# Patient Record
Sex: Female | Born: 1980
Health system: Southern US, Community
[De-identification: ages and names within clinical notes are randomized; demographics above are authoritative.]

## PROBLEM LIST (undated history)

## (undated) DIAGNOSIS — F909 Attention-deficit hyperactivity disorder, unspecified type: Secondary | ICD-10-CM

## (undated) DIAGNOSIS — F319 Bipolar disorder, unspecified: Secondary | ICD-10-CM

## (undated) DIAGNOSIS — G43909 Migraine, unspecified, not intractable, without status migrainosus: Secondary | ICD-10-CM

## (undated) DIAGNOSIS — T50992A Poisoning by other drugs, medicaments and biological substances, intentional self-harm, initial encounter: Secondary | ICD-10-CM

## (undated) HISTORY — PX: NO PAST SURGERIES: SHX2092

---

## 2007-09-27 ENCOUNTER — Emergency Department: Payer: Self-pay

## 2008-06-21 ENCOUNTER — Emergency Department: Payer: Self-pay

## 2009-11-26 DIAGNOSIS — R9431 Abnormal electrocardiogram [ECG] [EKG]: Secondary | ICD-10-CM | POA: Insufficient documentation

## 2009-11-26 DIAGNOSIS — I10 Essential (primary) hypertension: Secondary | ICD-10-CM | POA: Insufficient documentation

## 2014-09-28 DIAGNOSIS — G43909 Migraine, unspecified, not intractable, without status migrainosus: Secondary | ICD-10-CM | POA: Insufficient documentation

## 2015-04-01 ENCOUNTER — Encounter: Payer: Self-pay | Admitting: Emergency Medicine

## 2015-04-01 ENCOUNTER — Emergency Department
Admission: EM | Admit: 2015-04-01 | Discharge: 2015-04-01 | Disposition: A | Discharge status: Home / Self Care | Payer: PRIVATE HEALTH INSURANCE | Attending: Emergency Medicine | Admitting: Emergency Medicine

## 2015-04-01 DIAGNOSIS — Y9389 Activity, other specified: Secondary | ICD-10-CM | POA: Diagnosis not present

## 2015-04-01 DIAGNOSIS — Y92410 Unspecified street and highway as the place of occurrence of the external cause: Secondary | ICD-10-CM | POA: Diagnosis not present

## 2015-04-01 DIAGNOSIS — Y998 Other external cause status: Secondary | ICD-10-CM | POA: Diagnosis not present

## 2015-04-01 DIAGNOSIS — Z041 Encounter for examination and observation following transport accident: Secondary | ICD-10-CM | POA: Diagnosis not present

## 2015-04-01 HISTORY — DX: Attention-deficit hyperactivity disorder, unspecified type: F90.9

## 2015-04-01 HISTORY — DX: Migraine, unspecified, not intractable, without status migrainosus: G43.909

## 2015-04-01 NOTE — Discharge Instructions (Signed)
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
## 2015-07-31 LAB — HEPATIC FUNCTION PANEL
ALK PHOS: 78 U/L (ref 25–125)
ALT: 15 U/L (ref 7–35)
AST: 17 U/L (ref 13–35)
Bilirubin, Total: 0.3 mg/dL

## 2015-07-31 LAB — LIPID PANEL
Cholesterol: 205 mg/dL — AB (ref 0–200)
HDL: 62 mg/dL (ref 35–70)
LDL CALC: 113 mg/dL
LDl/HDL Ratio: 3.3
Triglycerides: 117 mg/dL (ref 40–160)

## 2015-07-31 LAB — CBC AND DIFFERENTIAL
HCT: 40 % (ref 36–46)
Hemoglobin: 13.3 g/dL (ref 12.0–16.0)
NEUTROS ABS: 4 /uL
PLATELETS: 274 10*3/uL (ref 150–399)
WBC: 6.4 10*3/mL

## 2015-07-31 LAB — BASIC METABOLIC PANEL
BUN: 12 mg/dL (ref 4–21)
Creatinine: 1 mg/dL (ref 0.5–1.1)
Glucose: 78 mg/dL
Potassium: 4.1 mmol/L (ref 3.4–5.3)
Sodium: 138 mmol/L (ref 137–147)

## 2015-07-31 LAB — TSH: TSH: 1.3 u[IU]/mL (ref 0.41–5.90)

## 2015-08-15 ENCOUNTER — Encounter: Payer: Self-pay | Admitting: Family Medicine

## 2015-08-21 ENCOUNTER — Encounter: Payer: Self-pay | Admitting: Family Medicine

## 2015-08-21 ENCOUNTER — Ambulatory Visit (INDEPENDENT_AMBULATORY_CARE_PROVIDER_SITE_OTHER): Payer: PRIVATE HEALTH INSURANCE | Admitting: Family Medicine

## 2015-08-21 VITALS — BP 124/82 | HR 72 | Temp 98.5°F | Resp 16 | Ht 66.5 in | Wt 200.0 lb

## 2015-08-21 DIAGNOSIS — F908 Attention-deficit hyperactivity disorder, other type: Secondary | ICD-10-CM

## 2015-08-21 DIAGNOSIS — R635 Abnormal weight gain: Secondary | ICD-10-CM

## 2015-08-21 DIAGNOSIS — E538 Deficiency of other specified B group vitamins: Secondary | ICD-10-CM

## 2015-08-21 DIAGNOSIS — F329 Major depressive disorder, single episode, unspecified: Secondary | ICD-10-CM

## 2015-08-21 DIAGNOSIS — F314 Bipolar disorder, current episode depressed, severe, without psychotic features: Secondary | ICD-10-CM | POA: Insufficient documentation

## 2015-08-21 DIAGNOSIS — F32A Depression, unspecified: Secondary | ICD-10-CM

## 2015-08-21 DIAGNOSIS — F909 Attention-deficit hyperactivity disorder, unspecified type: Secondary | ICD-10-CM | POA: Insufficient documentation

## 2015-08-21 MED ORDER — METFORMIN HCL 500 MG PO TABS: 500.0000 mg | ORAL_TABLET | Freq: Every day | ORAL | Status: DC

## 2015-08-21 MED ORDER — "SYRINGE 25G X 1"" 3 ML MISC": Status: DC

## 2015-08-21 MED ORDER — CYANOCOBALAMIN 1000 MCG/ML IJ SOLN
1000.0000 ug | Freq: Once | INTRAMUSCULAR | Status: AC
  Administered 2015-08-21: 1000 ug via INTRAMUSCULAR

## 2015-08-21 MED ORDER — CYANOCOBALAMIN 1000 MCG/ML IJ SOLN: 1000.0000 ug | Freq: Once | INTRAMUSCULAR | Status: DC

## 2015-08-21 NOTE — Progress Notes (Signed)
Patient ID: Shelby Gallegos, female   DOB: 07-06-1981, 34 y.o.   MRN: [id number]    Subjective:  HPI  Patient is here to get re established and discuss recent labs that were done by Dr. Evelene Croon.  Patient states she was sent here due to her Vit B12 level-106 drawn on 07/31/15. At that time other labs were drawn including lipid, TSH, CBC, MetC, Vitamin D. Patient states that the reason labs were drawn is because she was gaining weight rapidly even Dr. Evelene Croon was shocked.  In May 2016 patient weighed 150 lbs and today she is at 200 lbs.  She does exercise twice a week at the gym-walking and weights mainly. 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  Prior to Admission medications   Medication Sig Start Date End Date Taking? Authorizing Provider  ALPRAZolam Prudy Feeler) 0.5 MG tablet Take 0.5 mg by mouth at bedtime as needed for anxiety.   Yes Historical Provider, MD  amphetamine-dextroamphetamine (ADDERALL) 20 MG tablet Take 20 mg by mouth 2 (two) times daily.   Yes Historical Provider, MD  isometheptene-acetaminophen-dichloralphenazone (MIDRIN) 65-100-325 MG capsule Take 1 capsule by mouth 4 (four) times daily as needed for migraine. Maximum 5 capsules in 12 hours for migraine headaches, 8 capsules in 24 hours for tension headaches.   Yes Historical Provider, MD  lamoTRIgine (LAMICTAL) 200 MG tablet LAMICTAL,  (Oral Tablet)  1 q d for 0 days  Quantity: 0.00;  Refills: 0   Ordered :04-Jul-2008  Janey Greaser ;  Started 04-Jul-2008 Active 07/04/08  Yes Historical Provider, MD  topiramate (TOPAMAX) 100 MG tablet Take 100 mg by mouth 2 (two) times daily.   Yes Historical Provider, MD    Patient Active Problem List   Diagnosis Date Noted  . Depression 08/21/2015  . ADHD (attention deficit hyperactivity disorder) 08/21/2015  . Headache, migraine 09/28/2014  . Abnormal ECG 11/26/2009  . Essential (primary) hypertension  11/26/2009  . Bipolar I disorder 07/04/2008    Past Medical History  Diagnosis Date  . ADHD (attention deficit hyperactivity disorder)   . Migraine     Social History   Social History  . Marital Status: Single    Spouse Name: N/A  . Number of Children: N/A  . Years of Education: N/A   Occupational History  . Not on file.   Social History Main Topics  . Smoking status: Never Smoker   . Smokeless tobacco: Never Used  . Alcohol Use: No  . Drug Use: No  . Sexual Activity: Not on file   Other Topics Concern  . Not on file   Social History Narrative    No Known Allergies  Review of Systems  Constitutional: Negative.        Weight gain rapid  Eyes: Negative.   Respiratory: Negative.   Cardiovascular: Negative.   Gastrointestinal: Negative.   Musculoskeletal: Negative.   Skin: Negative.   Neurological: Negative.   Endo/Heme/Allergies: Negative.   Psychiatric/Behavioral: Positive for depression. The patient is nervous/anxious.      Objective:  BP 124/82 mmHg  Pulse 72  Temp(Src) 98.5 F (36.9 C)  Resp 16  Ht 5' 6.5" (1.689 m)  Wt 200 lb (90.719 kg)  BMI 31.80 kg/m2  Physical Exam  Constitutional: She is oriented to person, place, and time and well-developed, well-nourished, and in no distress.  Mildly obese white female in no  acute distress  HENT:  Head: Normocephalic and atraumatic.  Right Ear: External ear normal.  Left Ear: External ear normal.  Nose: Nose normal.  Neck: Neck supple.  Cardiovascular: Normal rate, regular rhythm and normal heart sounds.   Pulmonary/Chest: Effort normal and breath sounds normal.  Abdominal: Soft.  Neurological: She is alert and oriented to person, place, and time.  Skin: Skin is warm and dry.  Psychiatric: Mood, memory, affect and judgment normal.      Assessment and Plan :  1. Depression Followed by psychiatry.  2. Attention-deficit hyperactivity disorder, other type   3. Weight gain, abnormal -  metFORMIN (GLUCOPHAGE) 500 MG tablet; Take 1 tablet (500 mg total) by mouth daily with breakfast.  Dispense: 30 tablet; Refill: 12 Increased appetite due to meds for depression--necessary. RTC 1 month on the metformin. 4. Vitamin B12 deficiency Go ahead and treat for 6-12 months with parenteral therapy then switch to po B12 and folic acid. - cyanocobalamin ((VITAMIN B-12)) injection 1,000 mcg; Inject 1 mL (1,000 mcg total) into the muscle once. - cyanocobalamin (,VITAMIN B-12,) 1000 MCG/ML injection; Inject 1 mL (1,000 mcg total) into the muscle once.  Dispense: 10 mL; Refill: 12 - Syringe/Needle, Disp, (SYRINGE 3CC/25GX1") 25G X 1" 3 ML MISC; Administer injection once a week for 3 weeks then go to once a month.  Dispense: 4 each; Refill: 12   Julieanne Manson MD [facility] 08/21/2015 9:24 AM

## 2015-09-10 ENCOUNTER — Ambulatory Visit: Payer: PRIVATE HEALTH INSURANCE | Admitting: Family Medicine

## 2015-09-17 ENCOUNTER — Ambulatory Visit: Payer: PRIVATE HEALTH INSURANCE | Admitting: Family Medicine

## 2015-10-09 ENCOUNTER — Encounter: Payer: Self-pay | Admitting: Physician Assistant

## 2015-10-09 ENCOUNTER — Ambulatory Visit: Payer: Self-pay | Admitting: Physician Assistant

## 2015-10-09 VITALS — BP 130/80 | HR 111 | Temp 99.0°F

## 2015-10-09 DIAGNOSIS — J069 Acute upper respiratory infection, unspecified: Secondary | ICD-10-CM

## 2015-10-09 MED ORDER — AZITHROMYCIN 250 MG PO TABS: ORAL_TABLET | ORAL | Status: DC

## 2015-10-09 NOTE — Progress Notes (Signed)
S: C/o runny nose and congestion for 3 days, some  fever, no chills, cp/sob, v/d; mucus is green and thick, cough is sporadic, c/o of facial and dental pain.   Using otc meds:   O: PE: perrl eomi, normocephalic, tms dull, nasal mucosa red and swollen, + green mucus in nasal cavity;  throat injected, neck supple no lymph, lungs c t a, cv rrr, neuro intact  A:  Acute sinusitis   P: zpack, drink fluids, continue regular meds , use otc meds of choice, return if not improving in 5 days, return earlier if worsening

## 2015-12-15 ENCOUNTER — Emergency Department
Admission: EM | Admit: 2015-12-15 | Discharge: 2015-12-15 | Disposition: A | Discharge status: Home / Self Care | Payer: Managed Care, Other (non HMO) | Attending: Emergency Medicine | Admitting: Emergency Medicine

## 2015-12-15 ENCOUNTER — Emergency Department: Payer: Managed Care, Other (non HMO)

## 2015-12-15 ENCOUNTER — Encounter: Payer: Self-pay | Admitting: *Deleted

## 2015-12-15 DIAGNOSIS — I10 Essential (primary) hypertension: Secondary | ICD-10-CM | POA: Insufficient documentation

## 2015-12-15 DIAGNOSIS — K529 Noninfective gastroenteritis and colitis, unspecified: Secondary | ICD-10-CM

## 2015-12-15 DIAGNOSIS — R1031 Right lower quadrant pain: Secondary | ICD-10-CM | POA: Diagnosis present

## 2015-12-15 DIAGNOSIS — Z79899 Other long term (current) drug therapy: Secondary | ICD-10-CM | POA: Insufficient documentation

## 2015-12-15 DIAGNOSIS — K6389 Other specified diseases of intestine: Secondary | ICD-10-CM

## 2015-12-15 DIAGNOSIS — Z7984 Long term (current) use of oral hypoglycemic drugs: Secondary | ICD-10-CM | POA: Insufficient documentation

## 2015-12-15 DIAGNOSIS — Z3202 Encounter for pregnancy test, result negative: Secondary | ICD-10-CM | POA: Diagnosis not present

## 2015-12-15 DIAGNOSIS — K599 Functional intestinal disorder, unspecified: Secondary | ICD-10-CM | POA: Insufficient documentation

## 2015-12-15 HISTORY — DX: Bipolar disorder, unspecified: F31.9

## 2015-12-15 LAB — CBC WITH DIFFERENTIAL/PLATELET
BASOS ABS: 0.1 10*3/uL (ref 0–0.1)
Basophils Relative: 1 %
Eosinophils Absolute: 0.1 10*3/uL (ref 0–0.7)
Eosinophils Relative: 1 %
HCT: 39.8 % (ref 35.0–47.0)
Hemoglobin: 13.1 g/dL (ref 12.0–16.0)
LYMPHS ABS: 2.2 10*3/uL (ref 1.0–3.6)
LYMPHS PCT: 22 %
MCH: 28.9 pg (ref 26.0–34.0)
MCHC: 32.8 g/dL (ref 32.0–36.0)
MCV: 88 fL (ref 80.0–100.0)
MONO ABS: 0.8 10*3/uL (ref 0.2–0.9)
Monocytes Relative: 8 %
Neutro Abs: 7.2 10*3/uL — ABNORMAL HIGH (ref 1.4–6.5)
Neutrophils Relative %: 68 %
Platelets: 269 10*3/uL (ref 150–440)
RBC: 4.52 MIL/uL (ref 3.80–5.20)
RDW: 13.4 % (ref 11.5–14.5)
WBC: 10.4 10*3/uL (ref 3.6–11.0)

## 2015-12-15 LAB — URINALYSIS COMPLETE WITH MICROSCOPIC (ARMC ONLY)
BACTERIA UA: NONE SEEN
Bilirubin Urine: NEGATIVE
GLUCOSE, UA: NEGATIVE mg/dL
Ketones, ur: NEGATIVE mg/dL
NITRITE: NEGATIVE
PROTEIN: NEGATIVE mg/dL
Specific Gravity, Urine: 1.013 (ref 1.005–1.030)
pH: 7 (ref 5.0–8.0)

## 2015-12-15 LAB — COMPREHENSIVE METABOLIC PANEL
ALT: 19 U/L (ref 14–54)
AST: 20 U/L (ref 15–41)
Albumin: 3.9 g/dL (ref 3.5–5.0)
Alkaline Phosphatase: 81 U/L (ref 38–126)
Anion gap: 9 (ref 5–15)
BUN: 10 mg/dL (ref 6–20)
CALCIUM: 8.9 mg/dL (ref 8.9–10.3)
CO2: 23 mmol/L (ref 22–32)
CREATININE: 1.18 mg/dL — AB (ref 0.44–1.00)
Chloride: 106 mmol/L (ref 101–111)
GFR calc Af Amer: >60 mL/min (ref >60)
GFR calc non Af Amer: 59 mL/min — ABNORMAL LOW (ref >60)
Glucose, Bld: 98 mg/dL (ref 65–99)
Potassium: 3.5 mmol/L (ref 3.5–5.1)
Sodium: 138 mmol/L (ref 135–145)
Total Bilirubin: 0.4 mg/dL (ref 0.3–1.2)
Total Protein: 7.4 g/dL (ref 6.5–8.1)

## 2015-12-15 LAB — POCT PREGNANCY, URINE: Preg Test, Ur: NEGATIVE

## 2015-12-15 LAB — LIPASE, BLOOD: LIPASE: 21 U/L (ref 11–51)

## 2015-12-15 MED ORDER — IOHEXOL 240 MG/ML SOLN
25.0000 mL | Freq: Once | INTRAMUSCULAR | Status: AC | PRN
  Administered 2015-12-15: 25 mL via ORAL

## 2015-12-15 MED ORDER — IOHEXOL 300 MG/ML  SOLN
100.0000 mL | Freq: Once | INTRAMUSCULAR | Status: AC | PRN
  Administered 2015-12-15: 100 mL via INTRAVENOUS

## 2015-12-15 MED ORDER — KETOROLAC TROMETHAMINE 30 MG/ML IJ SOLN
30.0000 mg | Freq: Once | INTRAMUSCULAR | Status: AC
  Administered 2015-12-15: 30 mg via INTRAVENOUS
  Filled 2015-12-15: qty 1

## 2015-12-15 MED ORDER — TRAMADOL HCL 50 MG PO TABS: 50.0000 mg | ORAL_TABLET | Freq: Four times a day (QID) | ORAL | Status: DC | PRN

## 2015-12-15 NOTE — ED Notes (Signed)
Back from ct  Resting at present

## 2015-12-15 NOTE — ED Notes (Signed)
Developed RLQ pain about 2 days ago  No fever or n/v or urinary sx;s  States pain gets worse with palpation and inspiration

## 2015-12-15 NOTE — ED Provider Notes (Signed)
Time Seen: Approximately ----------------------------------------- 9:11 AM on 12/15/2015 -----------------------------------------    I have reviewed the triage notes  Chief Complaint: Abdominal Pain   History of Present Illness: Shelby Gallegos is a 35 y.o. female who states a 2 day history of intermittent right lower quadrant abdominal pain. She's had some mild nausea with no persistent vomiting. She denies any loose stool, diarrhea, constipation. She denies any dysuria, hematuria, or urinary frequency. She denies any fever at home. Patient states the pain seems to get worse with either pressing on her abdomen or with deep inspiration. She denies any radiation of pain to the flank or lower back region.   Past Medical History  Diagnosis Date  . ADHD (attention deficit hyperactivity disorder)   . Migraine   . Bipolar 1 disorder [facility](HCC)     Patient Active Problem List   Diagnosis Date Noted  . Depression 08/21/2015  . ADHD (attention deficit hyperactivity disorder) 08/21/2015  . Headache, migraine 09/28/2014  . Abnormal ECG 11/26/2009  . Essential (primary) hypertension 11/26/2009  . Bipolar I disorder (HCC) 07/04/2008    Past Surgical History  Procedure Laterality Date  . No past surgeries      Past Surgical History  Procedure Laterality Date  . No past surgeries      Current Outpatient Rx  Name  Route  Sig  Dispense  Refill  . ALPRAZolam (XANAX) 0.5 MG tablet   Oral   Take 0.5 mg by mouth at bedtime as needed for anxiety.         Marland Kitchen. amphetamine-dextroamphetamine (ADDERALL) 20 MG tablet   Oral   Take 20 mg by mouth 2 (two) times daily.         Marland Kitchen. azithromycin (ZITHROMAX Z-PAK) 250 MG tablet      2 pills today then 1 pill a day for 4 days   6 each   0   . cyanocobalamin (,VITAMIN B-12,) 1000 MCG/ML injection   Intramuscular   Inject 1 mL (1,000 mcg total) into the muscle once.   10 mL   12   . isometheptene-acetaminophen-dichloralphenazone (MIDRIN)  65-100-325 MG capsule   Oral   Take 1 capsule by mouth 4 (four) times daily as needed for migraine. Maximum 5 capsules in 12 hours for migraine headaches, 8 capsules in 24 hours for tension headaches.         . lamoTRIgine (LAMICTAL) 200 MG tablet      LAMICTAL, 200MG  (Oral Tablet)  1 q d for 0 days  Quantity: 0.00;  Refills: 0   Ordered :04-Jul-2008  Janey GreaserDeSanto, Elena ;  Started 04-Jul-2008 Active         . lurasidone (LATUDA) 40 MG TABS tablet   Oral   Take 20 mg by mouth daily with breakfast.         . metFORMIN (GLUCOPHAGE) 500 MG tablet   Oral   Take 1 tablet (500 mg total) by mouth daily with breakfast.   30 tablet   12   . Syringe/Needle, Disp, (SYRINGE 3CC/25GX1") 25G X 1" 3 ML MISC      Administer injection once a week for 3 weeks then go to once a month.   4 each   12   . topiramate (TOPAMAX) 100 MG tablet   Oral   Take 100 mg by mouth 2 (two) times daily.           Allergies:  Review of patient's allergies indicates no known allergies.  Family History: Family History  Problem  Relation Age of Onset  . Atrial fibrillation Mother     Social History: Social History  Substance Use Topics  . Smoking status: Never Smoker   . Smokeless tobacco: Never Used  . Alcohol Use: No     Review of Systems:   10 point review of systems was performed and was otherwise negative:  Constitutional: No fever Eyes: No visual disturbances ENT: No sore throat, ear pain Cardiac: No chest pain Respiratory: No shortness of breath, wheezing, or stridor Abdomen: No abdominal pain, no vomiting, No diarrhea Endocrine: No weight loss, No night sweats Extremities: No peripheral edema, cyanosis Skin: No rashes, easy bruising Neurologic: No focal weakness, trouble with speech or swollowing Urologic: No dysuria, Hematuria, or urinary frequency Patient denies any vaginal bleeding or discharge and states her last menstrual period was approximately 2 weeks ago and denies any  risk of being pregnant  Physical Exam:  ED Triage Vitals  Enc Vitals Group     BP 12/15/15 0851 139/85 mmHg     Pulse Rate 12/15/15 0851 110     Resp 12/15/15 0851 20     Temp 12/15/15 0851 98.2 F (36.8 C)     Temp Source 12/15/15 0851 Oral     SpO2 12/15/15 0851 98 %     Weight 12/15/15 0851 180 lb (81.647 kg)     Height 12/15/15 0851 5\' 7"  (1.702 m)     Head Cir --      Peak Flow --      Pain Score 12/15/15 0852 5     Pain Loc --      Pain Edu? --      Excl. in GC? --     General: Awake , Alert , and Oriented times 3; GCS 15 Head: Normal cephalic , atraumatic Eyes: Pupils equal , round, reactive to light Nose/Throat: No nasal drainage, patent upper airway without erythema or exudate.  Neck: Supple, Full range of motion, No anterior adenopathy or palpable thyroid masses Lungs: Clear to ascultation without wheezes , rhonchi, or rales Heart: Regular rate, regular rhythm without murmurs , gallops , or rubs Abdomen: Mild tenderness in her right middle quadrant without rebound, guarding , or rigidity; bowel sounds positive and symmetric in all 4 quadrants. No organomegaly .       No focal tenderness over McBurney's point, negative Murphy's sign Extremities: 2 plus symmetric pulses. No edema, clubbing or cyanosis Neurologic: normal ambulation, Motor symmetric without deficits, sensory intact Skin: warm, dry, no rashes   Labs:   All laboratory work was reviewed including any pertinent negatives or positives listed below:  Labs Reviewed  CBC WITH DIFFERENTIAL/PLATELET  COMPREHENSIVE METABOLIC PANEL  LIPASE, BLOOD  POC URINE PREG, ED   Review of patient's laboratory work shows no significant abnormalities  Radiology:   EXAM: CT ABDOMEN AND PELVIS WITH CONTRAST  TECHNIQUE: Multidetector CT imaging of the abdomen and pelvis was performed using the standard protocol following bolus administration of intravenous contrast.  CONTRAST: OMNIPAQUE IOHEXOL 300 MG/ML  SOLN  COMPARISON: None.  FINDINGS: Lower chest: No significant pulmonary nodules or acute consolidative airspace disease.  Hepatobiliary: Normal liver with no liver mass. Normal gallbladder with no radiopaque cholelithiasis. No biliary ductal dilatation.  Pancreas: Normal, with no mass or duct dilation.  Spleen: Normal size. No mass.  Adrenals/Urinary Tract: Normal adrenals. Normal kidneys with no hydronephrosis and no renal mass. Normal bladder.  Stomach/Bowel: Grossly normal stomach. Normal caliber small bowel with no small bowel wall thickening. Normal  appendix. There is a subcentimeter mildly thick walled structure with central fat density in the anterior pericolonic fat adjacent to the ascending colon (best seen on sagittal series 6/image 31) with associated surrounding fat stranding, in keeping with acute epiploic appendagitis. There is no large bowel wall thickening or colonic diverticulosis.  Vascular/Lymphatic: Normal caliber abdominal aorta. Patent portal, splenic, hepatic and renal veins. No pathologically enlarged lymph nodes in the abdomen or pelvis.  Reproductive: Grossly normal uterus. No adnexal mass.  Other: No pneumoperitoneum, ascites or focal fluid collection.  Musculoskeletal: No aggressive appearing focal osseous lesions.  IMPRESSION: 1. Acute epiploic appendagitis adjacent to the anterior ascending colon. This is a self-resolving condition for which supportive care is indicated. 2. Otherwise normal CT abdomen/ pelvis. Normal appendix. No colonic diverticulosis.     I personally reviewed the radiologic studies     ED Course:  Patient's stay here was uneventful. Her differential includes all causes for lower abdominal pain in females Differential diagnosis includes but is not exclusive to ovarian cyst, ovarian torsion, acute appendicitis, urinary tract infection, endometriosis, bowel obstruction, colitis, renal colic, gastroenteritis,  etc. Given the patient's current clinical presentation and objective findings appears she has epiploic appendagitis. Patient stated was uneventful and she had symptomatic improvement with Toradol IV. She does not appear to have any signs of a surgical abdomen at this point.   Assessment:  Epiploic appendagitis  F   Plan:  Patient was advised to return immediately if condition worsens. Patient was advised to follow up with their primary care physician or other specialized physicians involved in their outpatient care             Jennye Moccasin, MD 12/15/15 1556

## 2015-12-15 NOTE — ED Notes (Signed)
Pt complains of right lower abdominal pain, pt denies fever and any other symptoms

## 2016-01-28 ENCOUNTER — Ambulatory Visit: Payer: Self-pay | Admitting: Registered Nurse

## 2016-01-28 VITALS — BP 119/75 | HR 133 | Temp 98.8°F

## 2016-01-28 DIAGNOSIS — J0101 Acute recurrent maxillary sinusitis: Secondary | ICD-10-CM

## 2016-01-28 DIAGNOSIS — H6593 Unspecified nonsuppurative otitis media, bilateral: Secondary | ICD-10-CM

## 2016-01-28 DIAGNOSIS — B349 Viral infection, unspecified: Secondary | ICD-10-CM

## 2016-01-28 DIAGNOSIS — R509 Fever, unspecified: Secondary | ICD-10-CM

## 2016-01-28 LAB — POCT INFLUENZA A/B
INFLUENZA B, POC: NEGATIVE
Influenza A, POC: NEGATIVE

## 2016-01-28 MED ORDER — LORATADINE 10 MG PO TABS: 10.0000 mg | ORAL_TABLET | Freq: Every day | ORAL | Status: DC

## 2016-01-28 MED ORDER — AZITHROMYCIN 250 MG PO TABS: 250.0000 mg | ORAL_TABLET | Freq: Every day | ORAL | Status: DC

## 2016-01-28 MED ORDER — SALINE SPRAY 0.65 % NA SOLN: 2.0000 | NASAL | Status: DC | PRN

## 2016-01-28 MED ORDER — FLUTICASONE PROPIONATE 50 MCG/ACT NA SUSP: 2.0000 | Freq: Every day | NASAL | Status: DC

## 2016-01-28 NOTE — Progress Notes (Signed)
Subjective:    Patient ID: Shelby Gallegos, female    DOB: [birth year], 35 y.o.   MRN: [id number]  HPI Comments: Single caucasian female here for evaluation of fever, rhinitis, nasal congestion and sinus pressure "kinda feels like a sinus infection" PMHx seasonal allergies currently not taking anything Tmax 102 fever; took advil at home this am  URI  This is a new problem. The current episode started in the past 7 days. The problem has been unchanged. The maximum temperature recorded prior to her arrival was 102 - 102.9 F. The fever has been present for 1 to 2 days. Associated symptoms include congestion, coughing, rhinorrhea, sinus pain, a sore throat and wheezing. Pertinent negatives include no abdominal pain, chest pain, diarrhea, dysuria, ear pain, headaches, joint pain, joint swelling, nausea, neck pain, plugged ear sensation, rash, sneezing, swollen glands or vomiting. She has tried NSAIDs, increased fluids, eating and sleep for the symptoms. The treatment provided mild relief.  Sinusitis Associated symptoms include congestion, coughing, sinus pressure and a sore throat. Pertinent negatives include no chills, diaphoresis, ear pain, headaches, neck pain, shortness of breath, sneezing or swollen glands.      Review of Systems  Constitutional: Positive for fever and fatigue. Negative for chills, diaphoresis, activity change and appetite change.  HENT: Positive for congestion, postnasal drip, rhinorrhea, sinus pressure, sore throat and voice change. Negative for ear discharge, ear pain, facial swelling, hearing loss, mouth sores, nosebleeds, sneezing, tinnitus and trouble swallowing.   Eyes: Negative for photophobia, pain, discharge, redness, itching and visual disturbance.  Respiratory: Positive for cough and wheezing. Negative for choking, chest tightness, shortness of breath and stridor.   Cardiovascular: Negative for chest pain, palpitations and leg swelling.  Gastrointestinal: Negative for  nausea, vomiting, abdominal pain, diarrhea, constipation, blood in stool and abdominal distention.  Endocrine: Negative for cold intolerance and heat intolerance.  Genitourinary: Negative for dysuria.  Musculoskeletal: Negative for myalgias, back pain, joint pain, joint swelling, arthralgias, gait problem and neck pain.  Skin: Negative for rash.  Allergic/Immunologic: Positive for environmental allergies. Negative for food allergies.  Neurological: Negative for dizziness, tremors, syncope, facial asymmetry, weakness and headaches.  Hematological: Negative for adenopathy. Does not bruise/bleed easily.  Psychiatric/Behavioral: Negative for sleep disturbance and agitation.       Objective:   Physical Exam  Constitutional: She is oriented to person, place, and time. She appears well-developed and well-nourished. She is active and cooperative.  Non-toxic appearance. She does not have a sickly appearance. She appears ill. No distress.  HENT:  Head: Normocephalic and atraumatic.  Right Ear: Hearing, external ear and ear canal normal. A middle ear effusion is present.  Left Ear: Hearing, external ear and ear canal normal. A middle ear effusion is present.  Nose: Mucosal edema and rhinorrhea present. No nose lacerations, sinus tenderness, nasal deformity, septal deviation or nasal septal hematoma. No epistaxis.  No foreign bodies. Right sinus exhibits maxillary sinus tenderness. Right sinus exhibits no frontal sinus tenderness. Left sinus exhibits maxillary sinus tenderness. Left sinus exhibits no frontal sinus tenderness.  Mouth/Throat: Uvula is midline and mucous membranes are normal. Mucous membranes are not pale, not dry and not cyanotic. She does not have dentures. No oral lesions. No trismus in the jaw. Normal dentition. No dental abscesses, uvula swelling, lacerations or dental caries. Posterior oropharyngeal edema and posterior oropharyngeal erythema present. No oropharyngeal exudate or tonsillar  abscesses.  Bilateral nasal turbinates copious clear discharge, congestion, erythema/edema; cobblestoning posterior pharynx; Bilateral TMs with air fluid level clear  Eyes: Conjunctivae, EOM and lids are normal. Pupils are equal, round, and reactive to light. Right eye exhibits no chemosis, no discharge, no exudate and no hordeolum. No foreign body present in the right eye. Left eye exhibits no chemosis, no discharge, no exudate and no hordeolum. No foreign body present in the left eye. Right conjunctiva is not injected. Right conjunctiva has no hemorrhage. Left conjunctiva is not injected. Left conjunctiva has no hemorrhage. No scleral icterus. Right eye exhibits normal extraocular motion and no nystagmus. Left eye exhibits normal extraocular motion and no nystagmus. Right pupil is round and reactive. Left pupil is round and reactive. Pupils are equal.  Neck: Trachea normal and normal range of motion. Neck supple. No tracheal tenderness, no spinous process tenderness and no muscular tenderness present. No rigidity. No tracheal deviation, no edema, no erythema and normal range of motion present. No thyroid mass and no thyromegaly present.  Cardiovascular: Normal rate, regular rhythm, S1 normal, S2 normal, normal heart sounds and intact distal pulses.  PMI is not displaced.  Exam reveals no gallop and no friction rub.   No murmur heard. Pulmonary/Chest: Effort normal and breath sounds normal. No accessory muscle usage or stridor. No respiratory distress. She has no decreased breath sounds. She has no wheezes. She has no rhonchi. She has no rales. She exhibits no tenderness.  Abdominal: Soft. She exhibits no distension.  Musculoskeletal: Normal range of motion. She exhibits no edema or tenderness.       Right shoulder: Normal.       Left shoulder: Normal.       Right hip: Normal.       Left hip: Normal.       Right knee: Normal.       Left knee: Normal.       Cervical back: Normal.       Right hand:  Normal.       Left hand: Normal.  Lymphadenopathy:       Head (right side): No submental, no submandibular, no tonsillar, no preauricular, no posterior auricular and no occipital adenopathy present.       Head (left side): No submental, no submandibular, no tonsillar, no preauricular, no posterior auricular and no occipital adenopathy present.    She has no cervical adenopathy.       Right cervical: No superficial cervical, no deep cervical and no posterior cervical adenopathy present.      Left cervical: No superficial cervical, no deep cervical and no posterior cervical adenopathy present.  Neurological: She is alert and oriented to person, place, and time. She has normal strength. She is not disoriented. She displays no atrophy and no tremor. No cranial nerve deficit or sensory deficit. She exhibits normal muscle tone. She displays no seizure activity. Coordination and gait normal. GCS eye subscore is 4. GCS verbal subscore is 5. GCS motor subscore is 6.  Skin: Skin is warm, dry and intact. No abrasion, no bruising, no burn, no ecchymosis, no laceration, no lesion, no petechiae and no rash noted. She is not diaphoretic. No cyanosis or erythema. No pallor. Nails show no clubbing.  Psychiatric: She has a normal mood and affect. Her speech is normal and behavior is normal. Judgment and thought content normal. Cognition and memory are normal.  Nursing note and vitals reviewed.     1000 patient notified rapid flu negative.  Patient verbalized understanding of information and had no further questions at this time.    Assessment & Plan:  A-viral illness, otitis  media with effusion, acute maxillary sinusitis P- Supportive treatment.   No evidence of invasive bacterial infection, non toxic and well hydrated.  This is most likely self limiting viral infection.  I do not see where any further testing or imaging is necessary at this time.   I will suggest supportive care, rest, good hygiene and encourage  the patient to take adequate fluids.  The patient is to return to clinic or EMERGENCY ROOM if symptoms worsen or change significantly e.g. ear pain, fever, purulent discharge from ears or bleeding.  Exitcare handout on otitis media with effusion given to patient.  Patient verbalized agreement and understanding of treatment plan.    Patient notified rapid strep negative.  Suspect Viral illness: no evidence of invasive bacterial infection, non toxic and well hydrated.  This is most likely self limiting viral infection.  I do not see where any further testing or imaging is necessary at this time.   I will suggest supportive care, rest, good hygiene and encourage the patient to take adequate fluids. work excuse x 48 hours discussed stay at home while running a fever greater than 100.5 degrees.  flonase 1 spray each nostril BID prn, nasal saline 1-2 sprays each nostril prn q2h, motrin 800mg  po TID prn.  Discussed honey with lemon and salt water gargles for comfort also.  The patient is to return to clinic or EMERGENCY ROOM if symptoms worsen or change significantly e.g. fever, lethargy, SOB, wheezing.   Patient verbalized agreement and understanding of treatment plan.    flonase 1 spray each nostril BID, saline 2 sprays each nostril q2h prn congestion. claritin 10mg  po daily.  Discussed sudafed is a stimulant as well as her adderall could cause palpitations.  Tylenol 1000mg  po QID prn or motrin 800mg  po TID prn fever/pain.  If no improvement with 48 hours of saline and flonase use start azithromycin Rx 500mg  po day 1 then 250mg  po daily days 2-5.  Rx given.  No evidence of systemic bacterial infection, non toxic and well hydrated.  I do not see where any further testing or imaging is necessary at this time.   I will suggest supportive care, rest, good hygiene and encourage the patient to take adequate fluids.  The patient is to return to clinic or EMERGENCY ROOM if symptoms worsen or change significantly.  Patient  verbalized agreement and understanding of treatment plan and had no further questions at this time.   P2:  Hand washing and cover cough  Rx azithromycin 500mg  po today and then 250mg  po days 2-5 given.  Honey with lemon, humidifier use, push po fluids.  Bronchitis simple, community acquired, may have started as viral (probably respiratory syncytial, parainfluenza, influenza, or adenovirus), but now evidence of acute purulent bronchitis with resultant bronchial edema and mucus formation.  Viruses are the most common cause of bronchial inflammation in otherwise healthy adults with acute bronchitis.  The appearance of sputum is not predictive of whether a bacterial infection is present.  Purulent sputum is most often caused by viral infections.  There are a small portion of those caused by non-viral agents being Mycoplamsa pneumonia.  Microscopic examination or C&S of sputum in the healthy adult with acute bronchitis is generally not helpful (usually negative or normal respiratory flora) other considerations being cough from upper respiratory tract infections, sinusitis or allergic syndromes (mild asthma or viral pneumonia).  Differential Diagnosis:  reactive airway disease (asthma, allergic aspergillosis (eosinophilia), chronic bronchitis, respiratory infection (Sinusitis, Common cold, pneumonia), congestive heart  failure, reflux esophagitis, bronchogenic tumor, aspiration syndromes and/or exposure irritants/tobacco smoke.  In this case, there is no evidence of any invasive bacterial illness.  Most likely viral etiology so will hold on antibiotic treatment.  Advise supportive care with rest, encourage fluids, good hygiene and watch for any worsening symptoms.  If they were to develop:  come back to the office or go to the emergency room if after hours. Without high fever, severe dyspnea, lack of physical findings or other risk factors, I will hold on a chest radiograph and CBC at this time.  I discussed that  approximately 50% of patients with acute bronchitis have a cough that lasts up to three weeks, and 25% for over a month.  Tylenol, one to two tablets every four hours as needed for fever or myalgias.   No aspirin.  Patient instructed to follow up in one week or sooner if symptoms worsen. Patient verbalized agreement and understanding of treatment plan.  P2:  hand washing and cover cough   Usually no specific medical treatment is needed if a virus is causing the sore throat.  The throat most often gets better on its own within 5 to 7 days.  Antibiotic medicine does not cure viral pharyngitis.   For acute pharyngitis caused by bacteria, your healthcare provider will prescribe an antibiotic.  Marland Kitchen Do not smoke.  Marland Kitchen Avoid secondhand smoke and other air pollutants.  . Use a cool mist humidifier to add moisture to the air.  . Get plenty of rest.  . You may want to rest your throat by talking less and eating a diet that is mostly liquid or soft for a day or two.   Marland Kitchen Nonprescription throat lozenges and mouthwashes should help relieve the soreness.   . Gargling with warm saltwater and drinking warm liquids may help.  (You can make a saltwater solution by adding 1/4 teaspoon of salt to 8 ounces, or 240 mL, of warm water.)  . A nonprescription pain reliever such as aspirin, acetaminophen, or ibuprofen may ease general aches and pains.   FOLLOW UP with clinic provider if no improvements in the next 7-10 days.  Patient verbalized understanding of instructions and agreed with plan of care. P2:  Hand washing and diet.

## 2016-09-10 ENCOUNTER — Emergency Department: Payer: Worker's Compensation

## 2016-09-10 ENCOUNTER — Encounter: Payer: Self-pay | Admitting: Emergency Medicine

## 2016-09-10 ENCOUNTER — Emergency Department
Admission: EM | Admit: 2016-09-10 | Discharge: 2016-09-10 | Disposition: A | Discharge status: Home / Self Care | Payer: Worker's Compensation | Attending: Student | Admitting: Student

## 2016-09-10 DIAGNOSIS — Y929 Unspecified place or not applicable: Secondary | ICD-10-CM | POA: Insufficient documentation

## 2016-09-10 DIAGNOSIS — W010XXA Fall on same level from slipping, tripping and stumbling without subsequent striking against object, initial encounter: Secondary | ICD-10-CM | POA: Diagnosis not present

## 2016-09-10 DIAGNOSIS — Z79899 Other long term (current) drug therapy: Secondary | ICD-10-CM | POA: Insufficient documentation

## 2016-09-10 DIAGNOSIS — Y990 Civilian activity done for income or pay: Secondary | ICD-10-CM | POA: Diagnosis not present

## 2016-09-10 DIAGNOSIS — F909 Attention-deficit hyperactivity disorder, unspecified type: Secondary | ICD-10-CM | POA: Insufficient documentation

## 2016-09-10 DIAGNOSIS — Y9389 Activity, other specified: Secondary | ICD-10-CM | POA: Insufficient documentation

## 2016-09-10 DIAGNOSIS — M79641 Pain in right hand: Secondary | ICD-10-CM

## 2016-09-10 DIAGNOSIS — S60221A Contusion of right hand, initial encounter: Secondary | ICD-10-CM | POA: Diagnosis not present

## 2016-09-10 DIAGNOSIS — S6991XA Unspecified injury of right wrist, hand and finger(s), initial encounter: Secondary | ICD-10-CM | POA: Diagnosis present

## 2016-09-10 NOTE — ED Provider Notes (Signed)
[facility] Emergency Department Provider Note   ____________________________________________   First MD Initiated Contact with Patient 09/10/16 [id number]     (approximate)  I have reviewed the triage vital signs and the nursing notes.   HISTORY  Chief Complaint Hand Injury    HPI Shelby Gallegos is a 35 y.o. female with bipolar disorder, ADHD and migraines who presents for evaluation of right hand injury/pain secondary to a fall which occured only just prior to arrival, sudden onset, pain has been constant, moderate, worse with movement. Patient works as a Radiation protection practitioner, she slipped while she was exiting the ambulance and landed on the right hand. She reports the pinky finger was briefly trapped under a grate when and she was able to free it, it was dislocated however she put it back in place. She is complaining of mild right shoulder pain but has no other pain complaints. No chest pain or difficulty breathing. No head injury or loss of consciousness.   Past Medical History:  Diagnosis Date  . ADHD (attention deficit hyperactivity disorder)   . Bipolar 1 disorder (HCC)   . Migraine     Patient Active Problem List   Diagnosis Date Noted  . Depression 08/21/2015  . ADHD (attention deficit hyperactivity disorder) 08/21/2015  . Headache, migraine 09/28/2014  . Abnormal ECG 11/26/2009  . Essential (primary) hypertension 11/26/2009  . Bipolar I disorder (HCC) 07/04/2008    Past Surgical History:  Procedure Laterality Date  . NO PAST SURGERIES      Prior to Admission medications   Medication Sig Start Date End Date Taking? Authorizing Provider  amphetamine-dextroamphetamine (ADDERALL) 20 MG tablet Take 20 mg by mouth 3 (three) times daily.     Historical Provider, MD  azithromycin (ZITHROMAX) 250 MG tablet Take 1 tablet (250 mg total) by mouth daily. Take first 2 tablets together, then 1 every day until finished. 01/28/16   Barbaraann Barthel, NP  fluticasone  (FLONASE) 50 MCG/ACT nasal spray Place 2 sprays into both nostrils daily. 01/28/16   Barbaraann Barthel, NP  lamoTRIgine (LAMICTAL) 200 MG tablet 1 tablet oral daily 07/04/08   Historical Provider, MD  loratadine (CLARITIN) 10 MG tablet Take 1 tablet (10 mg total) by mouth daily. 01/28/16   Barbaraann Barthel, NP  Lurasidone HCl (LATUDA) 60 MG TABS Take 1 tablet by mouth daily.    Historical Provider, MD  sodium chloride (OCEAN) 0.65 % SOLN nasal spray Place 2 sprays into both nostrils every 2 (two) hours as needed for congestion. 01/28/16   Barbaraann Barthel, NP  SUMAtriptan (IMITREX) 6 MG/0.5ML SOLN injection Inject 6 mg into the skin every 2 (two) hours as needed for migraine or headache. May repeat in 2 hours if headache persists or recurs.    Historical Provider, MD    Allergies Review of patient's allergies indicates no known allergies.  Family History  Problem Relation Age of Onset  . Atrial fibrillation Mother     Social History Social History  Substance Use Topics  . Smoking status: Never Smoker  . Smokeless tobacco: Never Used  . Alcohol use No    Review of Systems Constitutional: No fever/chills Eyes: No visual changes. ENT: No sore throat. Cardiovascular: Denies chest pain. Respiratory: Denies shortness of breath. Gastrointestinal: No abdominal pain.  No nausea, no vomiting.  No diarrhea.  No constipation. Genitourinary: Negative for dysuria. Musculoskeletal: Negative for back pain. Skin: Negative for rash. Neurological: Negative for headaches, focal weakness or numbness.  10-point ROS  otherwise negative.  ____________________________________________   PHYSICAL EXAM:  VITAL SIGNS: ED Triage Vitals  Enc Vitals Group     BP 09/10/16 0443 112/73     Pulse Rate 09/10/16 0443 95     Resp 09/10/16 0443 18     Temp --      Temp src --      SpO2 09/10/16 0443 97 %     Weight 09/10/16 0444 180 lb (81.6 kg)     Height 09/10/16 0444 5\' 7"  (1.702 m)     Head Circumference  --      Peak Flow --      Pain Score --      Pain Loc --      Pain Edu? --      Excl. in GC? --     Constitutional: Alert and oriented. Well appearing and in no acute distress. Eyes: Conjunctivae are normal. PERRL. EOMI. Head: Atraumatic. Nose: No congestion/rhinnorhea. Mouth/Throat: Mucous membranes are moist.  Oropharynx non-erythematous. Neck: No stridor. Supple without meningismus. Cardiovascular: Normal rate, regular rhythm. Grossly normal heart sounds.  Good peripheral circulation. Respiratory: Normal respiratory effort.  No retractions. Lungs CTAB. Gastrointestinal: Soft and nontender. No distention.  No CVA tenderness. Genitourinary: Deferred Musculoskeletal: Abrasions, swelling, tenderness of the knuckles of the right hand. Full flexion and extension of all joints in all fingers. Right radial, median and ulnar nerve intact, 2+ right radial pulse. Tenderness to palpation in the right wrist, no tenderness in the anatomic snuff box. Full active painless range of motion at the right shoulder without swelling or deformity. Neurologic:  Normal speech and language. No gross focal neurologic deficits are appreciated. No gait instability. Skin:  Skin is warm, dry and intact. No rash noted. Psychiatric: Mood and affect are normal. Speech and behavior are normal.  ____________________________________________   LABS (all labs ordered are listed, but only abnormal results are displayed)  Labs Reviewed - No data to display ____________________________________________  EKG  none ____________________________________________  RADIOLOGY  Xray right hand FINDINGS:  There is no evidence of fracture or dislocation. There is no  evidence of arthropathy or other focal bone abnormality. Soft  tissues are unremarkable.    IMPRESSION:  No acute fracture or dislocation identified.      ____________________________________________   PROCEDURES  Procedure(s) performed:  None  Procedures  Critical Care performed: No  ____________________________________________   INITIAL IMPRESSION / ASSESSMENT AND PLAN / ED COURSE  Pertinent labs & imaging results that were available during my care of the patient were reviewed by me and considered in my medical decision making (see chart for details).  Shelby Gallegos is a 35 y.o. female with bipolar disorder, ADHD and migraines who presents for evaluation of right hand injury/pain secondary to a fall which occured only just prior to arrival. Exam, she is very well-appearing and in no acute distress. Vital signs stable and she is afebrile. She is neurovascularly intact in the right arm/hand with moderate bruising and tenderness. Plain films negative for acute fracture or dislocation. Clinical picture consistent with contusion. Discussed return precautions, need for close PCP follow-up and she is comfortable with the discharge plan. DC home.  Clinical Course     ____________________________________________   FINAL CLINICAL IMPRESSION(S) / ED DIAGNOSES  Final diagnoses:  Contusion of right hand, initial encounter  Right hand pain      NEW MEDICATIONS STARTED DURING THIS VISIT:  New Prescriptions   No medications on file     Note:  This document  was prepared using Conservation officer, historic buildings and may include unintentional dictation errors.    Gayla Doss, MD 09/10/16 [phone]

## 2016-09-10 NOTE — ED Triage Notes (Signed)
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 Bruising noted.

## 2017-02-16 DIAGNOSIS — F603 Borderline personality disorder: Secondary | ICD-10-CM | POA: Insufficient documentation

## 2017-03-07 ENCOUNTER — Other Ambulatory Visit (HOSPITAL_COMMUNITY): Payer: 59 | Attending: Psychiatry | Admitting: Psychiatry

## 2017-03-07 ENCOUNTER — Encounter (HOSPITAL_COMMUNITY): Payer: Self-pay | Admitting: Psychiatry

## 2017-03-07 DIAGNOSIS — Z79899 Other long term (current) drug therapy: Secondary | ICD-10-CM | POA: Insufficient documentation

## 2017-03-07 DIAGNOSIS — F3132 Bipolar disorder, current episode depressed, moderate: Secondary | ICD-10-CM

## 2017-03-07 DIAGNOSIS — F319 Bipolar disorder, unspecified: Secondary | ICD-10-CM | POA: Insufficient documentation

## 2017-03-07 NOTE — Progress Notes (Signed)
Comprehensive Clinical Assessment (CCA) Note  03/07/2017 Shelby Gallegos [id number]  Visit Diagnosis:   No diagnosis found.    CCA Part One  Part One has been completed on paper by the patient.  (See scanned document in Chart Review)  CCA Part Two A  Intake/Chief Complaint:  CCA Intake With Chief Complaint CCA Part Two Date: 03/07/17 CCA Part Two Time: 1608 Chief Complaint/Presenting Problem: This is a 36 yr old, single, employed (currently out on short term disability), Caucasian female; transitioned from [place].  According to pt, she was admitted there for ten days due to worsening depressive symptoms with SI.  Pt c/o of anger outbursts.  Apparently, there was an anger outburst during group at [facility], in which pt punched a wall.  Was rushed to [facility] via ambulance.  Hand wasn't broken, but bruised badly.  According to pt, she has been diagnosed with Bipolar D/O since sophomore yr in high school.  "I've had anger problems for years.  I just can't control it."  Pt reports she will fold arms and lean against wall in order to calm herself to prevent herself from punching the wall.  Discussed safety options and no violence options with pt at length.  Pt was able to contract for safety.  Denies HI or A/V hallucinations.  Trigger/Stressor:  1)  Conflictual Relationships d/t anger outbursts.  In an attempt to get two friends to contact her, pt made up a fake account on social media and reached out to them to contact her (pt).  "I told them that they needed to contact me Harvin Hazel) before it was too late.  They wanted to meet with me and that is how they discovered it was me all along.  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  2)  Medical:  Pt has migraines.  At her last appt with the neurologist, she had a syncopal episode.  States she was just there for a f/u migraine appt.  The doctor has taken pt out of work  from Feb. until August 2018.  Pt reports two prior psychiatric hospitalizations:  [facility].  Has been seeing Dr. Evelene Croon and Mila Palmer, [facility] on an outpt basis.  Denies any prior suicide attempts/gestures.  Family Hx:  Paternal Uncle (Bipolar D/O).                                                                                         Patients Currently Reported Symptoms/Problems: Sadness, poor self-esteem, anger outbursts, indecisiveness, irritability, anhedonia, no motivation, no energy, ruminating thoughts, SI Collateral Involvement: States family is very supportive. Individual's Strengths: Pt is motivated for treatment. Type of Services Patient Feels Are Needed: MH-IOP. Initial Clinical Notes/Concerns: Patient's anger outbursts.  Mental Health Symptoms Depression:  Depression: Change in energy/activity, Difficulty Concentrating, Irritability, Fatigue  Mania:  Mania: N/A  Anxiety:   Anxiety: Irritability, Tension  Psychosis:  Psychosis: N/A  Trauma:  Trauma: N/A  Obsessions:  Obsessions: N/A  Compulsions:  Compulsions: N/A  Inattention:  Inattention:  N/A  Hyperactivity/Impulsivity:  Hyperactivity/Impulsivity: N/A  Oppositional/Defiant Behaviors:  Oppositional/Defiant Behaviors: N/A  Borderline Personality:  Emotional Irregularity: N/A  Other Mood/Personality Symptoms:      Mental Status Exam Appearance and self-care  Stature:  Stature: Average  Weight:  Weight: Average weight  Clothing:  Clothing: Casual  Grooming:  Grooming: Normal  Cosmetic use:  Cosmetic Use: None  Posture/gait:  Posture/Gait: Normal  Motor activity:  Motor Activity: Not Remarkable  Sensorium  Attention:  Attention: Distractible  Concentration:  Concentration: Normal  Orientation:  Orientation: X5  Recall/memory:  Recall/Memory: Normal  Affect and Mood  Affect:  Affect: Depressed  Mood:  Mood: Anxious  Relating  Eye contact:  Eye Contact: Normal  Facial expression:  Facial Expression:  Responsive  Attitude toward examiner:  Attitude Toward Examiner: Cooperative  Thought and Language  Speech flow: Speech Flow: Normal  Thought content:  Thought Content: Appropriate to mood and circumstances  Preoccupation:     Hallucinations:     Organization:     Company secretary of Knowledge:  Fund of Knowledge: Average  Intelligence:  Intelligence: Average  Abstraction:  Abstraction: Normal  Judgement:  Judgement: Fair  Dance movement psychotherapist:  Reality Testing: Adequate  Insight:  Insight: Gaps  Decision Making:  Decision Making: Impulsive  Social Functioning  Social Maturity:  Social Maturity: Impulsive  Social Judgement:  Social Judgement: Normal  Stress  Stressors:  Stressors: Work, Environmental health practitioner Ability:  Coping Ability: Building surveyor Deficits:     Supports:      Family and Psychosocial History: Family history Marital status: Single Are you sexually active?: No What is your sexual orientation?: heterosexual Does patient have children?: No  Childhood History:  Childhood History By whom was/is the patient raised?: Both parents Additional childhood history information: Pt was born in [place], [place].  Reports having a "great" childhood.  Denies any trauma or abuse.  Reports parents had a child to die at age 77.  States it happened yrs before she was born.  States the death brought her family even closer.   Description of patient's relationship with caregiver when they were a child: Very close to parents. 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 Has patient ever been in the [employer]?: No Has patient ever served in Buyer, retail?: No Did You Receive Any Psychiatric Treatment/Services While in Equities trader?: No Are There Guns or Other Weapons in Your Home?: No  Education: Education Did Garment/textile technologist From [employer]?: Yes Did Theme park manager?: Yes What Type of College Degree Do you Have?: BA What Was Your Major?: Human Services Did You Have An Individualized Education Program (IIEP): No Did You Have Any Difficulty At School?: Yes Were Any Medications Ever Prescribed For These Difficulties?: Yes Medications Prescribed For School Difficulties?: ADHD  Religion: Religion/Spirituality Are You A Religious Person?: Yes What is Your Religious Affiliation?: Christian  Leisure/Recreation: Leisure / Recreation Leisure and Hobbies: Attending church and filming there  Exercise/Diet: Exercise/Diet Do You Exercise?: Yes How Many Times a Week Do  You Exercise?: 1-3 times a week Have You Gained or Lost A Significant Amount of Weight in the Past Six Months?: No Do You Follow a Special Diet?: No Do You Have Any Trouble Sleeping?: Yes Explanation of Sleeping Difficulties: ruminating thoughts  CCA Part Two C  Alcohol/Drug Use: Alcohol / Drug Use History of alcohol / drug use?: No history of alcohol / drug abuse                      CCA Part Three  ASAM's:  Six Dimensions of Multidimensional Assessment  Dimension 1:  Acute Intoxication and/or Withdrawal Potential:     Dimension 2:  Biomedical Conditions and Complications:     Dimension 3:  Emotional, Behavioral, or Cognitive Conditions and Complications:     Dimension 4:  Readiness to Change:     Dimension 5:  Relapse, Continued use, or Continued Problem Potential:     Dimension 6:  Recovery/Living Environment:      Substance use  Disorder (SUD)    Social Function:  Social Functioning Social Maturity: Impulsive Social Judgement: Normal  Stress:  Stress Stressors: Work, Veterinary surgeon Coping Ability: Overwhelmed Patient Takes Medications The Way The Doctor Instructed?: Yes Priority Risk: Moderate Risk  Risk Assessment- Self-Harm Potential: Risk Assessment For Self-Harm Potential Thoughts of Self-Harm: Vague current thoughts Method: No plan Availability of Means: No access/NA Additional Comments for Self-Harm Potential: able to contract for safety  Risk Assessment -Dangerous to Others Potential: Risk Assessment For Dangerous to Others Potential Method: No Plan Availability of Means: No access or NA Intent: Vague intent or NA Notification Required: No need or identified person  DSM5 Diagnoses: Patient Active Problem List   Diagnosis Date Noted  . Depression 08/21/2015  . ADHD (attention deficit hyperactivity disorder) 08/21/2015  . Headache, migraine 09/28/2014  . Abnormal ECG 11/26/2009  . Essential (primary) hypertension 11/26/2009  . Bipolar I disorder (HCC) 07/04/2008    Patient Centered Plan: Patient is on the following Treatment Plan(s):  Depression and Impulse Control  Recommendations for Services/Supports/Treatments: Recommendations for Services/Supports/Treatments Recommendations For Services/Supports/Treatments: IOP (Intensive Outpatient Program)  Treatment Plan Summary:  Oriented pt to MH-IOP.  Provided pt with an orientation folder.  F/U with Dr. Evelene Croon and Mila Palmer, [facility].  Encouraged support groups.  Referrals to Alternative Service(s): Referred to Alternative Service(s):   Place:   Date:   Time:    Referred to Alternative Service(s):   Place:   Date:   Time:    Referred to Alternative Service(s):   Place:   Date:   Time:    Referred to Alternative Service(s):   Place:   Date:   Time:     CLARK, RITA, M.Ed, CNA

## 2017-03-07 NOTE — Progress Notes (Signed)
Psychiatric Initial Adult Assessment   Patient Identification: Shelby Gallegos MRN:  [id number] Date of Evaluation:  03/07/2017 Referral Source: [facility] Inpatient psychiatric Chief Complaint: depression  Visit Diagnosis: bipolar 1 disorder currently depressed  History of Present Illness:  Shelby Gallegos says she had a series of interactions with friends that led her to be dishonest and lose those close friends.  She blames herself.  the loss of the friends, her guilt and not being able to make things right again led her to be depressed to the point of needing hospitalization.  She is feeling less depressed but still in need of help in trying to  resolve her internal feelings to relieve some of the depression.  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 Her bipolar disorder includes manic episodes. Associated Signs/Symptoms: Depression Symptoms:  depressed mood, anhedonia, fatigue, difficulty concentrating, (Hypo) Manic Symptoms:  Impulsivity, Irritable Mood, Anxiety Symptoms:  Excessive Worry, Psychotic Symptoms:  none PTSD Symptoms: Negative  Past Psychiatric History: one earlier inpatient stay as a teenager,  Treated over the years for bipolar disorder  Previous Psychotropic Medications: Yes   Substance Abuse History in the last 12 months:  No.  Consequences of Substance Abuse: Negative  Past Medical History:  Past Medical History:  Diagnosis Date  . ADHD (attention deficit hyperactivity disorder)   . Bipolar 1 disorder (HCC)   . Migraine     Past Surgical History:  Procedure Laterality Date  . NO PAST SURGERIES      Family Psychiatric History: uncle diagnosed with bipolar disorder  Family History:  Family History  Problem Relation Age of Onset  . Atrial fibrillation Mother     Social History:   Social History   Social History  . Marital status: Single    Spouse name: N/A  .  Number of children: N/A  . Years of education: N/A   Social History Main Topics  . Smoking status: Never Smoker  . Smokeless tobacco: Never Used  . Alcohol use No  . Drug use: No  . Sexual activity: Not Asked   Other Topics Concern  . None   Social History Narrative  . None    Additional Social History: none  Allergies:  No Known Allergies  Metabolic Disorder Labs: No results found for: HGBA1C, MPG No results found for: PROLACTIN Lab Results  Component Value Date   CHOL 205 (A) 07/31/2015   TRIG 117 07/31/2015   HDL 62 07/31/2015   LDLCALC 113 07/31/2015     Current Medications: Current Outpatient Prescriptions  Medication Sig Dispense Refill  . amphetamine-dextroamphetamine (ADDERALL) 20 MG tablet Take 20 mg by mouth 3 (three) times daily.     Marland Kitchen azithromycin (ZITHROMAX) 250 MG tablet Take 1 tablet (250 mg total) by mouth daily. Take first 2 tablets together, then 1 every day until finished. 6 tablet 0  . fluticasone (FLONASE) 50 MCG/ACT nasal spray Place 2 sprays into both nostrils daily. 16 g 1  . lamoTRIgine (LAMICTAL) 200 MG tablet 1 tablet oral daily    . loratadine (CLARITIN) 10 MG tablet Take 1 tablet (10 mg total) by mouth daily. 30 tablet 1  . Lurasidone HCl (LATUDA) 60 MG TABS Take 1 tablet by mouth daily.    . sodium chloride (OCEAN) 0.65 % SOLN nasal spray Place 2 sprays into both nostrils every 2 (two) hours as needed for congestion.  0  .  SUMAtriptan (IMITREX) 6 MG/0.5ML SOLN injection Inject 6 mg into the skin every 2 (two) hours as needed for migraine or headache. May repeat in 2 hours if headache persists or recurs.     No current facility-administered medications for this visit.     Neurologic: Headache: history of migraines Seizure: Negative Paresthesias:Negative  Musculoskeletal: Strength & Muscle Tone: within normal limits Gait & Station: normal Patient leans: N/A  Psychiatric Specialty Exam: ROS  There were no vitals taken for this  visit.There is no height or weight on file to calculate BMI.  General Appearance: Well Groomed  Eye Contact:  Good  Speech:  Clear and Coherent  Volume:  Normal  Mood:  Anxious  Affect:  Congruent  Thought Process:  Coherent and Goal Directed  Orientation:  Full (Time, Place, and Person)  Thought Content:  Logical  Suicidal Thoughts:  No  Homicidal Thoughts:  No  Memory:  Immediate;   Good Recent;   Good Remote;   Good  Judgement:  Good  Insight:  Good  Psychomotor Activity:  Normal  Concentration:  Concentration: Good and Attention Span: Good  Recall:  Good  Fund of Knowledge:Good  Language: Good  Akathisia:  Negative  Handed:  Right  AIMS (if indicated):  0  Assets:  Communication Skills Desire for Improvement Financial Resources/Insurance Housing Leisure Time Physical Health Resilience Social Support Talents/Skills Transportation Vocational/Educational  ADL's:  Intact  Cognition: WNL  Sleep:  improved    Treatment Plan Summary: Admit to IOP with daily group therapy.  Continue current meds as they are.       Carolanne Grumbling, MD 4/9/201811:52 AM

## 2017-03-08 ENCOUNTER — Other Ambulatory Visit (HOSPITAL_COMMUNITY): Payer: 59 | Admitting: Psychiatry

## 2017-03-08 DIAGNOSIS — F319 Bipolar disorder, unspecified: Secondary | ICD-10-CM | POA: Diagnosis not present

## 2017-03-08 DIAGNOSIS — F3132 Bipolar disorder, current episode depressed, moderate: Secondary | ICD-10-CM

## 2017-03-08 NOTE — Progress Notes (Signed)
    Daily Group Progress Note  Program: IOP  Group Time: 9:00-12:00  Participation Level: Active  Behavioral Response: Appropriate  Type of Therapy:  Group Therapy  Summary of Progress: Pt. Presents as quiet with primarily flat affect. Pt. Was hesitant to check- in with the group but later asked to share part of her journal that she had written while at [facility]. Pt. Participated in discussion about use of the feelings wheel to identify emotions and use of mindfulness to be present with difficult emotions.        [name], LPC

## 2017-03-08 NOTE — Progress Notes (Signed)
Shelby Gallegos is a 36 y.o., single, Caucasian female who was transitioned from [place]. Pt continues to struggle with daily, vague SI (denies a plan or intent).  A:  Discussed safety options at length with pt.  Pt declined being admitted.  Able to contract for safety.  Informed Dr. Ladona Ridgel.  R:  Pt receptive.        Chestine Spore, RITA, M.Ed, CNA

## 2017-03-09 ENCOUNTER — Other Ambulatory Visit (HOSPITAL_COMMUNITY): Payer: 59 | Admitting: Psychiatry

## 2017-03-09 DIAGNOSIS — F3132 Bipolar disorder, current episode depressed, moderate: Secondary | ICD-10-CM

## 2017-03-09 DIAGNOSIS — F319 Bipolar disorder, unspecified: Secondary | ICD-10-CM | POA: Diagnosis not present

## 2017-03-09 NOTE — Progress Notes (Signed)
    Daily Group Progress Note  Program: IOP   Group Time: 9:00-12:00   Participation Level:  minimal   Behavioral Response:  responsive   Type of Therapy:  group therapy  Summary of Progress: This was the pt's first day in group.Pt. Met with psychiatrist and case manager. She was largely silent during group but seemed to be actively listening and engaged by the group members stories.   Nancie Neas, LPC

## 2017-03-10 ENCOUNTER — Other Ambulatory Visit (HOSPITAL_COMMUNITY): Payer: 59 | Admitting: Psychiatry

## 2017-03-10 DIAGNOSIS — F3132 Bipolar disorder, current episode depressed, moderate: Secondary | ICD-10-CM

## 2017-03-10 DIAGNOSIS — F319 Bipolar disorder, unspecified: Secondary | ICD-10-CM | POA: Diagnosis not present

## 2017-03-11 ENCOUNTER — Other Ambulatory Visit (HOSPITAL_COMMUNITY): Payer: 59 | Admitting: Psychiatry

## 2017-03-11 DIAGNOSIS — F3132 Bipolar disorder, current episode depressed, moderate: Secondary | ICD-10-CM

## 2017-03-11 DIAGNOSIS — F319 Bipolar disorder, unspecified: Secondary | ICD-10-CM | POA: Diagnosis not present

## 2017-03-11 NOTE — Progress Notes (Signed)
    Daily Group Progress Note  Program: IOP  Group Time: 9:00-12:00  Participation Level: Active  Behavioral Response: Appropriate  Type of Therapy:  Group Therapy  Summary of Progress: Pt. Presented as quiet in group. Pt. Shared with the group that group experiences were difficult for her. Pt. Participated in guided meditation breathing exercise to assist with grounding. Pt. Shared that the exercise was a challenge for her but she thought it would help her to relax.      [name], LPC

## 2017-03-14 ENCOUNTER — Other Ambulatory Visit (HOSPITAL_COMMUNITY): Payer: 59 | Admitting: Psychiatry

## 2017-03-14 DIAGNOSIS — F3132 Bipolar disorder, current episode depressed, moderate: Secondary | ICD-10-CM

## 2017-03-14 DIAGNOSIS — F319 Bipolar disorder, unspecified: Secondary | ICD-10-CM | POA: Diagnosis not present

## 2017-03-15 ENCOUNTER — Other Ambulatory Visit (HOSPITAL_COMMUNITY): Payer: 59 | Admitting: Psychiatry

## 2017-03-15 DIAGNOSIS — F3132 Bipolar disorder, current episode depressed, moderate: Secondary | ICD-10-CM

## 2017-03-15 DIAGNOSIS — F319 Bipolar disorder, unspecified: Secondary | ICD-10-CM | POA: Diagnosis not present

## 2017-03-16 ENCOUNTER — Other Ambulatory Visit (HOSPITAL_COMMUNITY): Payer: 59 | Admitting: Psychiatry

## 2017-03-16 DIAGNOSIS — F319 Bipolar disorder, unspecified: Secondary | ICD-10-CM | POA: Diagnosis not present

## 2017-03-16 NOTE — Progress Notes (Signed)
    Daily Group Progress Note  Program: IOP Group Time: 9:00-12:00   Participation Level:  active    Behavioral Response:  engaged   Type of Therapy:   group therapy   Summary of Progress: Pt. shared that she had a bout of anger earlier in the week and punched her door multiple times.  She went to the doctor and shattered her hand and is in a cast.  Pt.'s issues seem to be around anger, frustration, and guilt.  She will have some individual time on Monday to process more of her emotions.   [name], LPC

## 2017-03-16 NOTE — Progress Notes (Signed)
    Daily Group Progress Note  Program: IOP  Group Time: 9:00-12:00   Participation Level:  active   Behavioral Response: engaged   Type of Therapy:   group therapy  Summary of Progress: Pt reported having a good weekend. She and her partner were communicating in a healthier new way, and thus avoided any conflicts. However, pt did become upset with her mother when she felt forgotten by her. Pt was upset with herself for this angry outburst. The group explored her tendency to get angry with her mother using the unintentional model. The Pharmacist came to speak to group.  Derril Franek B, LPC 

## 2017-03-16 NOTE — Progress Notes (Signed)
    Daily Group Progress Note  Program: IOP  Group Time: 9:00-12:00   Participation Level:  active   Behavioral Response: engaged   Type of Therapy:  group therapy  Summary of Progress: Pt opened up about how she's struggled to maintain healthy relationships in the past. She also shared about her new diagnosis of borderline personality disorder, and how she's struggling with the label. The counselor and the group were supportive of her using the diagnosis in a way that informs her, but not defines her. Pt. Participated in grief and loss group facilitated by the Chaplain.      [name], LPC

## 2017-03-16 NOTE — Progress Notes (Signed)
    Daily Group Progress Note  Program: IOP  Group Time: 9:00-12:00  Participation Level: Active  Behavioral Response: Appropriate  Type of Therapy:  Group Therapy  Summary of Progress: Pt. Presents as anxious and often disengages from the group process. Pt. Brought her art and poetry to group, but required significant encouragement to share with the group. Pt. Left the group during the presentation by the wellness instructor and processed her anxiety with the case manager. Pt. Was encouraged to use her breathing and other coping skills that she had learned in group.     [name], LPC

## 2017-03-17 ENCOUNTER — Other Ambulatory Visit (HOSPITAL_COMMUNITY): Payer: 59 | Admitting: Psychiatry

## 2017-03-17 DIAGNOSIS — F319 Bipolar disorder, unspecified: Secondary | ICD-10-CM | POA: Diagnosis not present

## 2017-03-17 DIAGNOSIS — F3132 Bipolar disorder, current episode depressed, moderate: Secondary | ICD-10-CM

## 2017-03-17 NOTE — Progress Notes (Signed)
    Daily Group Progress Note  Program: IOP  Group Time: 9:00-12:00   Participation Level:  minimal   Behavioral Response:  responsive   Type of Therapy:   group therapy  Summary of Progress: Pt has made progress as indicated by her report that she has not been punching walls lately. However, she is often getting stuck in her head and ruminating. She struggles to communicate her experience in group.   [name], LPC

## 2017-03-18 ENCOUNTER — Telehealth (HOSPITAL_COMMUNITY): Payer: Self-pay | Admitting: Psychiatry

## 2017-03-18 ENCOUNTER — Other Ambulatory Visit (HOSPITAL_COMMUNITY): Payer: 59 | Admitting: Psychiatry

## 2017-03-18 DIAGNOSIS — F3132 Bipolar disorder, current episode depressed, moderate: Secondary | ICD-10-CM

## 2017-03-18 DIAGNOSIS — F319 Bipolar disorder, unspecified: Secondary | ICD-10-CM | POA: Diagnosis not present

## 2017-03-18 NOTE — Progress Notes (Signed)
Khelani Kops is a 36 y.o. female, who has been in [place] since 03-07-17.  Pt transitioned from [facility]. Pt has been displaying a lot of borderline personality tendencies (ie. Frequently leaving the groups, pacing, talking about the need to punch walls, etc).  Pt denies SI/HI or A/V hallucinations. Met briefly with pt to discuss her inability to self regulate her emotions.  Talked about black and white thinking.  Inquired if her therapist has ever mentioned DBT.  According to pt, she has never heard of DBT or borderline personality.  A:  Provided pt with support.  Encouraged pt to read about the dx and DBT.  Discussed upcoming d/c and aftercare plan.  Strongly encouraged pt to call for DBT group along with Groups at [facility].  Will obtain f/u appts for pt to see Dr. Toy Care and Madalyn Rob (therapist).  R:  Pt receptive.        Carlis Abbott, RITA, M.Ed, CNA

## 2017-03-21 ENCOUNTER — Other Ambulatory Visit (HOSPITAL_COMMUNITY): Payer: 59 | Admitting: Psychiatry

## 2017-03-21 DIAGNOSIS — F3132 Bipolar disorder, current episode depressed, moderate: Secondary | ICD-10-CM

## 2017-03-21 DIAGNOSIS — F319 Bipolar disorder, unspecified: Secondary | ICD-10-CM | POA: Diagnosis not present

## 2017-03-21 NOTE — Progress Notes (Signed)
    Daily Group Progress Note  Program: IOP Group Time: 9:00-12:00   Participation Level:  minimal   Behavioral Response:  flat    Type of Therapy:   group therapy  Summary of Progress: Pt continues to be largely shut down in group. When asked how she was doing she at first said she was "doing good" but when probed further she admitted that she's terrified about discharge. She shared an excerpt from her journal in which she wrote metaphorically about this fear. The counselor and group encouraged her to lean into the uncertainty and to rely on the fact that she's had experiences of uncertainty in the past which have turned out alright. The second half of the group was focused on grief and loss.  [name], LPC

## 2017-03-22 ENCOUNTER — Other Ambulatory Visit (HOSPITAL_COMMUNITY): Payer: 59 | Admitting: Psychiatry

## 2017-03-22 DIAGNOSIS — F319 Bipolar disorder, unspecified: Secondary | ICD-10-CM | POA: Diagnosis not present

## 2017-03-22 DIAGNOSIS — F3132 Bipolar disorder, current episode depressed, moderate: Secondary | ICD-10-CM

## 2017-03-23 ENCOUNTER — Other Ambulatory Visit (HOSPITAL_COMMUNITY): Payer: 59 | Admitting: Psychiatry

## 2017-03-23 DIAGNOSIS — F3132 Bipolar disorder, current episode depressed, moderate: Secondary | ICD-10-CM

## 2017-03-23 DIAGNOSIS — F319 Bipolar disorder, unspecified: Secondary | ICD-10-CM | POA: Diagnosis not present

## 2017-03-23 NOTE — Progress Notes (Signed)
    Daily Group Progress Note  Program: IOP   Group Time: 9:00-12:00   Participation Level:  active    Behavioral Response:  engaged   Type of Therapy:   group therapy   Summary of Progress: Pt. was fairly quiet in group.  She appeared engaged with her non-verbals - nodding and using some active listening.  She journaled during group as well.  Pt. reported that she enjoyed yoga even with her hand, which is still hurt.    [name], LPC

## 2017-03-23 NOTE — Progress Notes (Signed)
    Daily Group Progress Note  Program: IOP  Group Time: 9:00-12:00  Participation Level: Active  Behavioral Response: Appropriate  Type of Therapy:  Group Therapy  Summary of Progress: Pt. Presented as engaged in the group process. Pt. Shared her personal experience of shame and struggle to forgive herself for mistakes that she has made in relationships. Pt. Continues to be apologetic for her presence and participation and group. Pt. Received positive feedback and encouragement from the group to continue sharing. Pt. Participated in guided meditation and breathing exercise to help with relaxation and grounding.      [name], LPC

## 2017-03-23 NOTE — Progress Notes (Signed)
    Daily Group Progress Note  Program: IOP  Group Time: 9:00-12:00   Participation Level:  active    Behavioral Response: engaged   Type of Therapy:   group therapy   Summary of Progress: Pt. Participated in medication management group with the pharmacist. Pt. shared some of her poetry with the group today and the group gave her some connecting feedback. Many group members related to her poems and her feelings that she talked about within the poems.  [name], LPC

## 2017-03-24 ENCOUNTER — Other Ambulatory Visit (HOSPITAL_COMMUNITY): Payer: 59 | Admitting: Psychiatry

## 2017-03-24 DIAGNOSIS — F319 Bipolar disorder, unspecified: Secondary | ICD-10-CM | POA: Diagnosis not present

## 2017-03-24 NOTE — Progress Notes (Signed)
    Daily Group Progress Note  Program: IOP  Group Time: 9:00-12:00   Participation Level:  active   Behavioral Response:  responsive   Type of Therapy:  group therapy  Summary of Progress: Pt received an extention of her stay in the group and feels greatly relieved. She fears uncertainty, but realizes that in the past she has handled uncertain situations and been alright. At present she's at an impasse with her job. 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   [name], LPC

## 2017-03-25 ENCOUNTER — Other Ambulatory Visit (HOSPITAL_COMMUNITY): Payer: 59 | Admitting: Psychiatry

## 2017-03-25 DIAGNOSIS — F319 Bipolar disorder, unspecified: Secondary | ICD-10-CM | POA: Diagnosis not present

## 2017-03-25 DIAGNOSIS — F3132 Bipolar disorder, current episode depressed, moderate: Secondary | ICD-10-CM

## 2017-03-28 ENCOUNTER — Other Ambulatory Visit (HOSPITAL_COMMUNITY): Payer: 59 | Admitting: Psychiatry

## 2017-03-28 DIAGNOSIS — F319 Bipolar disorder, unspecified: Secondary | ICD-10-CM | POA: Diagnosis not present

## 2017-03-28 DIAGNOSIS — F3132 Bipolar disorder, current episode depressed, moderate: Secondary | ICD-10-CM

## 2017-03-28 NOTE — Progress Notes (Signed)
    Daily Group Progress Note  Program: IOP  Group Time: 9:00-12:00   Participation Level:  minimal   Behavioral Response:  flat   Type of Therapy:   group therapy  Summary of Progress: A discussion around how the pt processes her emotions in the present was held. Counselor encouraged pt to share openly with the group what she is experiencing in the moment. Pt has struggled, and continues to struggle with the feeling that she is a "monster" because of things she's unconsciously done to hurt others in the past. The group encouraged her to have self-compassion because we all make mistakes and hurt others unknowingly at times. Grief and loss group was held today, and that brought up a very distressing memory for pt which she was unable to communicate during grief and loss. After the chaplain who conducts grief and loss left she processed a bit about a situation on her job that she felt responsible for in the past.   [name], [facility]

## 2017-03-29 ENCOUNTER — Other Ambulatory Visit (HOSPITAL_COMMUNITY): Payer: 59 | Attending: Psychiatry

## 2017-03-29 DIAGNOSIS — F319 Bipolar disorder, unspecified: Secondary | ICD-10-CM | POA: Insufficient documentation

## 2017-03-30 ENCOUNTER — Other Ambulatory Visit (HOSPITAL_COMMUNITY): Payer: 59 | Admitting: Psychiatry

## 2017-03-30 DIAGNOSIS — F3132 Bipolar disorder, current episode depressed, moderate: Secondary | ICD-10-CM

## 2017-03-30 DIAGNOSIS — F319 Bipolar disorder, unspecified: Secondary | ICD-10-CM | POA: Diagnosis present

## 2017-03-30 NOTE — Progress Notes (Signed)
    Daily Group Progress Note  Program: IOP  Group Time: 9:00-12:00  Participation Level: Active  Behavioral Response: Appropriate  Type of Therapy:  Group Therapy  Summary of Progress: Pt. Participated in medication management group with the Chaplain. Pt. Shared "I am doing much better". Pt. Shared that she had a good weekend. Pt. Continues to express fear about her discharge and step down from the group. Pt. Processed with the group how to use her coping skills to cope with her anxiety about discharge I.e. Writing, drawing, reading, volunteer work, meeting with her therapist.      [name], [place]

## 2017-03-31 ENCOUNTER — Other Ambulatory Visit (HOSPITAL_COMMUNITY): Payer: 59

## 2017-03-31 NOTE — Progress Notes (Signed)
Patient ID: Shelby Gallegos Amor, female   DOB: 06/25/1981, 36 y.o.   MRN: [id number] [facility]  Patient:  Shelby Gallegos Rex DOB:  06/25/1981  Date of Admission: 03/07/2017  Date of Discharge:04/01/2017  Reason for Admission:depression following inpatient psychiatric admission  IOP Course:Ms Cedric FishmanSharpe attended and participated.  Her self report yesterday indicated she had no suicidal thinking.  Overall she is less depressed and anxious  Mental Status at Discharge:no suicidal thoughts  Diagnosis: bipolar disorder 1 current episode depressed , moderate  Level of Care:  IOP  Discharge destination:has appointments with her therapist and psychiatrist      Comments:  none  The patient received suicide prevention pamphlet:  Yes   Carolanne GrumblingGerald Foster Frericks, MD

## 2017-03-31 NOTE — Progress Notes (Signed)
    Daily Group Progress Note  Program: IOP  Group Time: 9:00-12:00  Participation Level: Active  Behavioral Response: Appropriate  Type of Therapy:  Group Therapy  Summary of Progress: Pt. Presented with calm affect. Pt. Reports that impulses to self-harm have reduced. Pt. Continues to reports fears about discharge on Friday. Pt. Reports that she is having a good day, that she met with her therapist on yesterday and was encouraged by their meeting. Pt. Reports that she met with her pastor and he communicated concern and empathy for her mental health. Pt. Also reported that she returned to her home and her father surprised her by cutting her grass and it made her feel happy and cared for.    Nancie Neas, LPC

## 2017-04-01 ENCOUNTER — Other Ambulatory Visit (HOSPITAL_COMMUNITY): Payer: 59 | Admitting: Psychiatry

## 2017-04-01 DIAGNOSIS — Z9151 Personal history of suicidal behavior: Secondary | ICD-10-CM | POA: Insufficient documentation

## 2017-04-01 DIAGNOSIS — F319 Bipolar disorder, unspecified: Secondary | ICD-10-CM | POA: Diagnosis not present

## 2017-04-01 DIAGNOSIS — F3132 Bipolar disorder, current episode depressed, moderate: Secondary | ICD-10-CM

## 2017-04-01 DIAGNOSIS — Z915 Personal history of self-harm: Secondary | ICD-10-CM | POA: Insufficient documentation

## 2017-04-01 NOTE — Progress Notes (Signed)
Shelby Gallegos is a 36 y.o. , single, employed (currently out on short term disability), Caucasian female; transitioned from [facility].  According to pt, she was admitted there for ten days due to worsening depressive symptoms with SI.  Pt c/o of anger outbursts.  Apparently, there was an anger outburst [facility], in which pt punched a wall.  Was [facility].  Hand wasn't broken, but bruised badly.  According to pt, she has been diagnosed with Bipolar D/O since sophomore yr in high school.  "I've had anger problems for years.  I just can't control it."  Pt reports she will fold arms and lean against wall in order to calm herself to prevent herself from punching the wall.  Discussed safety options and no violence options with pt at length.  Pt was able to contract for safety.  Denies HI or A/V hallucinations.  Trigger/Stressor:  1)  Conflictual Relationships d/t anger outbursts.  In an attempt to get two friends to contact her, pt made up a fake account on social media and reached out to them to contact her (pt).  "I told them that they needed to contact me Shelby Hazel(Mairin) before it was too late.  They wanted to meet with me and that is how they discovered it was me all along.  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  2)  Medical:  Pt has migraines.  At her last appt with the neurologist, she had a syncopal episode.  States she was just there for a f/u migraine appt.  The doctor has taken pt out of work from Feb. until August 2018.  Pt reports two prior psychiatric hospitalizations:  [facility] and [facility].  Has been seeing Dr. Evelene CroonKaur and Mila Palmeresiree Briggs, [facility] on an outpt basis.  Denies any prior suicide attempts/gestures.  Family Hx:  Paternal Uncle (Bipolar D/O).   Pt successfully completed MH-IOP today.  Pt saw her neurologist yesterday and she inquired if pt had any other syncopal episodes; pt  told her about [facility].  Pt is fearful that the doctor will add another six months of patient being out of work.  Pt has another appointment with the doctor in June.  Inquired if pt felt that she was ready to go back to work at this time any way; pt replied "definitely not."  Encouraged pt to use this time to work on getting hersefl emotionally better by attending DBT groups and MHAG. Pt reports overall mood improving; but continues to struggle at night.  Pt has been working on structuring her days.  Denies SI/HI or A/V hallucinations.   Pt states it is very hard for her to leave the program.  "I have gotten attached to you all and it's really hard to leave."  A:  D/C today.  F/U with Dr. Evelene CroonKaur and [facility]. [facility] and [facility].  Provided pt with support.  R:  Pt receptive.                                                   Chestine SporeLARK, RITA, M.ED, CNA

## 2017-04-01 NOTE — Patient Instructions (Signed)
D:  Pt successfully completed MH-IOP today.  A:  Will follow up with Mila Palmeresiree Briggs, LPC on 04-05-17 morning and Dr. Evelene CroonKaur on 04-06-17 afternoon.  Encouraged support groups.  R:  Pt receptive.

## 2017-04-04 ENCOUNTER — Other Ambulatory Visit (HOSPITAL_COMMUNITY): Payer: 59

## 2017-04-04 NOTE — Progress Notes (Signed)
    Daily Group Progress Note  Program: IOP  Group Time: 9:00-12:00  Participation Level: Active  Behavioral Response: Appropriate  Type of Therapy:  Group Therapy  Summary of Progress: Pt. Prepared for discharge with the case manager. Pt. Received positive feedback from the group regarding her contributions to the group. Pt. Was appropriately tearful, but hopeful that she was moving forward with her ongoing therapy and continuing to work with her neurologist and her psychiatrist. Pt. Was encouraged to set up ongoing support groups with the mental health association.     Shaune PollackBrown, Nyra Anspaugh B, LPC

## 2017-04-05 ENCOUNTER — Other Ambulatory Visit (HOSPITAL_COMMUNITY): Payer: 59

## 2017-04-06 ENCOUNTER — Other Ambulatory Visit (HOSPITAL_COMMUNITY): Payer: 59

## 2017-04-07 ENCOUNTER — Other Ambulatory Visit (HOSPITAL_COMMUNITY): Payer: 59

## 2017-04-08 ENCOUNTER — Other Ambulatory Visit (HOSPITAL_COMMUNITY): Payer: 59

## 2017-04-11 ENCOUNTER — Other Ambulatory Visit (HOSPITAL_COMMUNITY): Payer: 59

## 2017-04-12 ENCOUNTER — Other Ambulatory Visit (HOSPITAL_COMMUNITY): Payer: 59

## 2017-04-13 ENCOUNTER — Other Ambulatory Visit (HOSPITAL_COMMUNITY): Payer: 59

## 2017-04-14 ENCOUNTER — Other Ambulatory Visit (HOSPITAL_COMMUNITY): Payer: 59

## 2017-04-15 ENCOUNTER — Other Ambulatory Visit (HOSPITAL_COMMUNITY): Payer: 59

## 2017-04-18 ENCOUNTER — Other Ambulatory Visit (HOSPITAL_COMMUNITY): Payer: 59

## 2017-04-19 ENCOUNTER — Other Ambulatory Visit (HOSPITAL_COMMUNITY): Payer: 59

## 2017-04-20 ENCOUNTER — Other Ambulatory Visit (HOSPITAL_COMMUNITY): Payer: 59

## 2017-04-21 ENCOUNTER — Other Ambulatory Visit (HOSPITAL_COMMUNITY): Payer: 59

## 2017-04-22 ENCOUNTER — Other Ambulatory Visit (HOSPITAL_COMMUNITY): Payer: 59

## 2017-04-26 ENCOUNTER — Other Ambulatory Visit (HOSPITAL_COMMUNITY): Payer: 59

## 2017-04-27 ENCOUNTER — Other Ambulatory Visit (HOSPITAL_COMMUNITY): Payer: 59

## 2017-04-28 ENCOUNTER — Other Ambulatory Visit (HOSPITAL_COMMUNITY): Payer: 59

## 2017-04-29 ENCOUNTER — Other Ambulatory Visit (HOSPITAL_COMMUNITY): Payer: 59

## 2017-05-25 ENCOUNTER — Ambulatory Visit: Payer: Self-pay | Admitting: Physician Assistant

## 2017-05-25 ENCOUNTER — Encounter: Payer: Self-pay | Admitting: Physician Assistant

## 2017-05-25 VITALS — BP 110/70 | HR 87 | Temp 98.5°F | Resp 16 | Ht 67.0 in | Wt 171.0 lb

## 2017-05-25 DIAGNOSIS — Z008 Encounter for other general examination: Secondary | ICD-10-CM

## 2017-05-25 DIAGNOSIS — Z0189 Encounter for other specified special examinations: Secondary | ICD-10-CM

## 2017-05-25 DIAGNOSIS — Z0000 Encounter for general adult medical examination without abnormal findings: Secondary | ICD-10-CM

## 2017-05-25 NOTE — Progress Notes (Signed)
S: pt here for wellness physical and biometrics for insurance purposes, no complaints ros neg; has been out of work for mental health problems, is bipolar and attempted suicide, spent time in inpatient facility and is going to counseling and group sessions, denies si/hi at this time  PMH:   Bipolar, chronic migraine Social: nonsmoker  Fam: as noted on chart  O: vitals wnl, nad, ENT wnl, neck supple no lymph, lungs c t a, cv rrr, abd soft nontender bs normal all 4 quads  A: wellness, biometric physical  P: labs today

## 2017-05-26 LAB — CMP12+LP+TP+TSH+6AC+CBC/D/PLT
A/G RATIO: 1.7 (ref 1.2–2.2)
ALBUMIN: 4 g/dL (ref 3.5–5.5)
ALT: 16 IU/L (ref 0–32)
AST: 16 IU/L (ref 0–40)
Alkaline Phosphatase: 86 IU/L (ref 39–117)
BASOS ABS: 0 10*3/uL (ref 0.0–0.2)
BUN / CREAT RATIO: 9 (ref 9–23)
BUN: 9 mg/dL (ref 6–20)
Basos: 0 %
CHOLESTEROL TOTAL: 174 mg/dL (ref 100–199)
Calcium: 8.9 mg/dL (ref 8.7–10.2)
Chloride: 109 mmol/L — ABNORMAL HIGH (ref 96–106)
Chol/HDL Ratio: 2.8 ratio (ref 0.0–4.4)
Creatinine, Ser: 1.03 mg/dL — ABNORMAL HIGH (ref 0.57–1.00)
EOS (ABSOLUTE): 0.1 10*3/uL (ref 0.0–0.4)
EOS: 2 %
FREE THYROXINE INDEX: 1.9 (ref 1.2–4.9)
GFR calc Af Amer: 81 mL/min/{1.73_m2} (ref >59)
GFR, EST NON AFRICAN AMERICAN: 70 mL/min/{1.73_m2} (ref >59)
GGT: 12 IU/L (ref 0–60)
Globulin, Total: 2.4 g/dL (ref 1.5–4.5)
Glucose: 88 mg/dL (ref 65–99)
HDL: 63 mg/dL (ref >39)
HEMOGLOBIN: 12.4 g/dL (ref 11.1–15.9)
Hematocrit: 38.2 % (ref 34.0–46.6)
IMMATURE GRANULOCYTES: 0 %
IRON: 53 ug/dL (ref 27–159)
Immature Grans (Abs): 0 10*3/uL (ref 0.0–0.1)
LDH: 209 IU/L (ref 119–226)
LDL Calculated: 101 mg/dL — ABNORMAL HIGH (ref 0–99)
LYMPHS ABS: 1.2 10*3/uL (ref 0.7–3.1)
Lymphs: 19 %
MCH: 30 pg (ref 26.6–33.0)
MCHC: 32.5 g/dL (ref 31.5–35.7)
MCV: 93 fL (ref 79–97)
MONOS ABS: 0.4 10*3/uL (ref 0.1–0.9)
Monocytes: 6 %
NEUTROS PCT: 73 %
Neutrophils Absolute: 4.7 10*3/uL (ref 1.4–7.0)
PLATELETS: 281 10*3/uL (ref 150–379)
Phosphorus: 3.4 mg/dL (ref 2.5–4.5)
Potassium: 4.1 mmol/L (ref 3.5–5.2)
RBC: 4.13 x10E6/uL (ref 3.77–5.28)
RDW: 13.4 % (ref 12.3–15.4)
Sodium: 143 mmol/L (ref 134–144)
T3 UPTAKE RATIO: 24 % (ref 24–39)
T4, Total: 8.1 ug/dL (ref 4.5–12.0)
TOTAL PROTEIN: 6.4 g/dL (ref 6.0–8.5)
TSH: 5.37 u[IU]/mL — ABNORMAL HIGH (ref 0.450–4.500)
Triglycerides: 50 mg/dL (ref 0–149)
Uric Acid: 2.6 mg/dL (ref 2.5–7.1)
VLDL CHOLESTEROL CAL: 10 mg/dL (ref 5–40)
WBC: 6.4 10*3/uL (ref 3.4–10.8)

## 2017-05-26 LAB — VITAMIN D 25 HYDROXY (VIT D DEFICIENCY, FRACTURES): VIT D 25 HYDROXY: 28.8 ng/mL — AB (ref 30.0–100.0)

## 2017-06-09 IMAGING — CT CT ABD-PELV W/ CM
1 of 2 series · 15 of 32 positions shown, 19 images · IV contrast (omnipaque)
Comparison: None.

CLINICAL DATA: Right mid to lower abdominal pain for 2 days.

EXAM:
CT ABDOMEN AND PELVIS WITH CONTRAST
TECHNIQUE: Multidetector CT imaging of the abdomen and pelvis was performed
using the standard protocol following bolus administration of
intravenous contrast.
CONTRAST:  100mL OMNIPAQUE IOHEXOL 300 MG/ML  SOLN

[Series 2: routine abd pel with · axial · 0.79mm/px · z∈[-510,-70]mm · 15 of 97 slices shown, 19 images]
[im 5/97  soft-tissue]
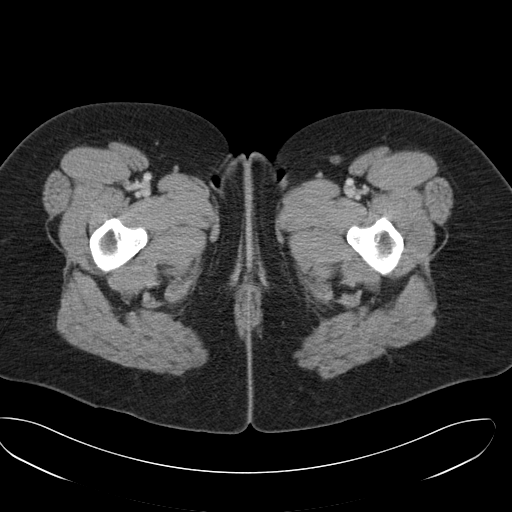
[im 5/97  bone]
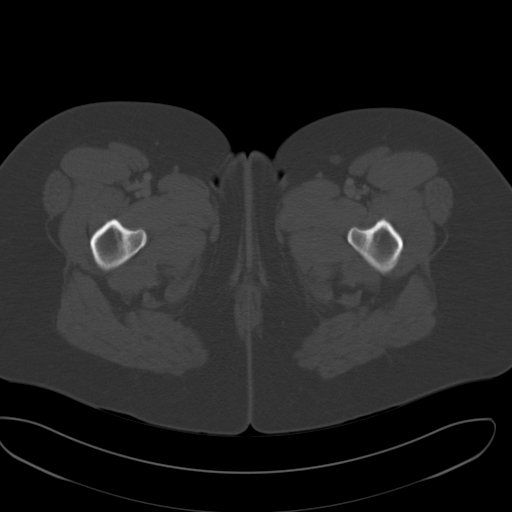
[im 13/97  soft-tissue]
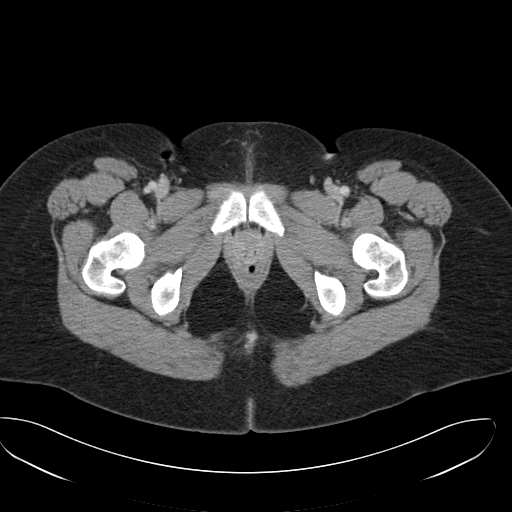
[im 21/97  soft-tissue]
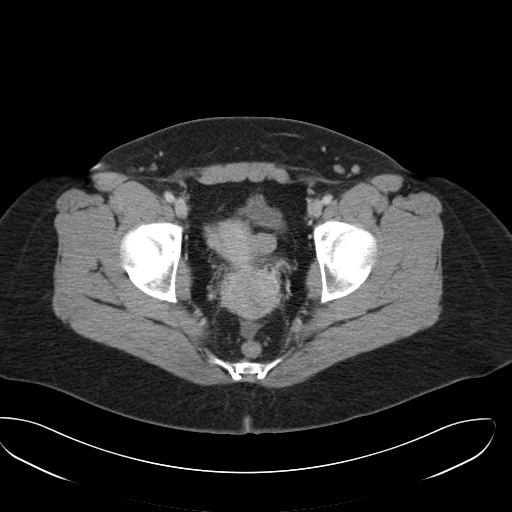
[im 29/97  soft-tissue]
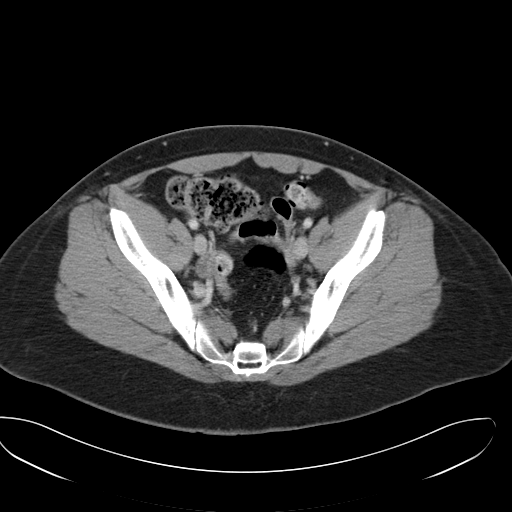
[im 33/97  soft-tissue]
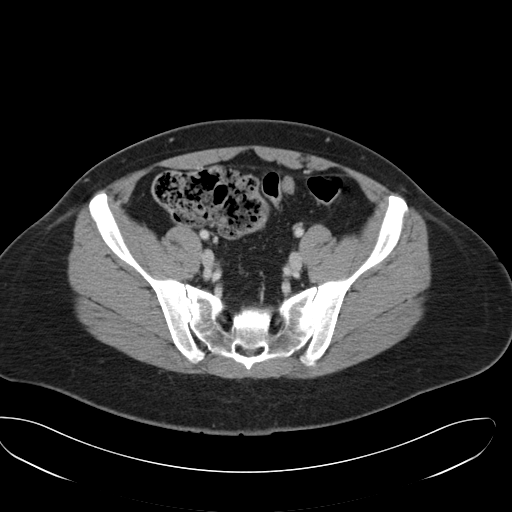
[im 41/97  soft-tissue]
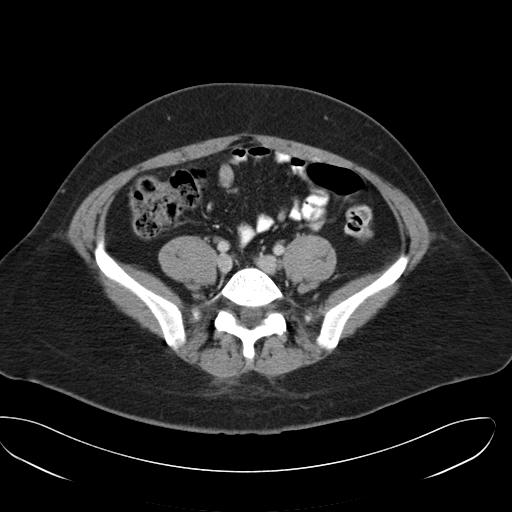
[im 49/97  soft-tissue]
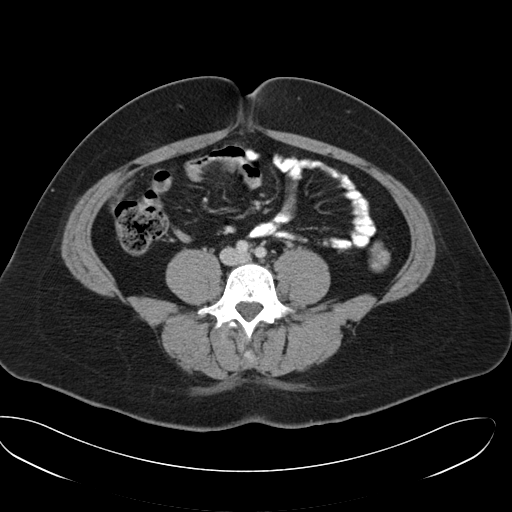
[im 57/97  soft-tissue]
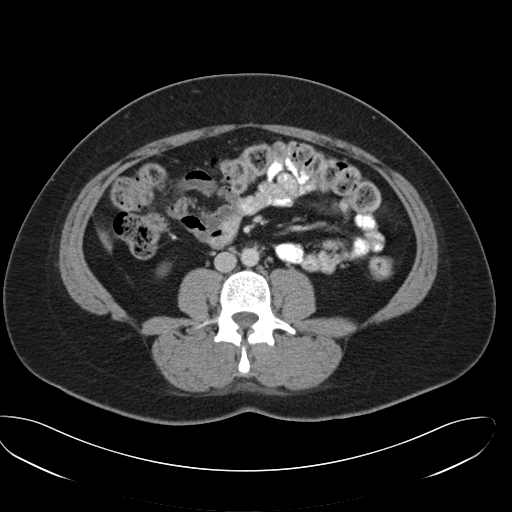
[im 65/97  soft-tissue]
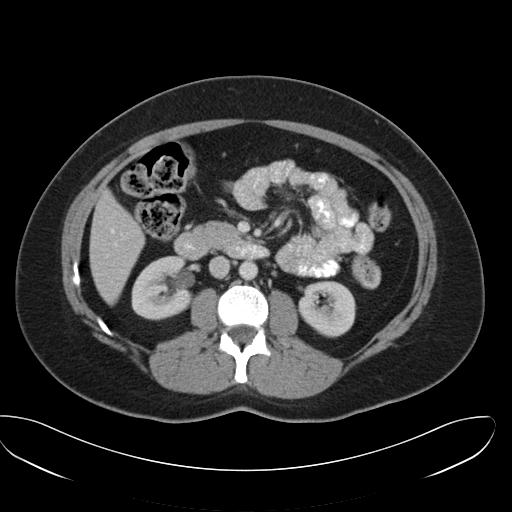
[im 65/97  bone]
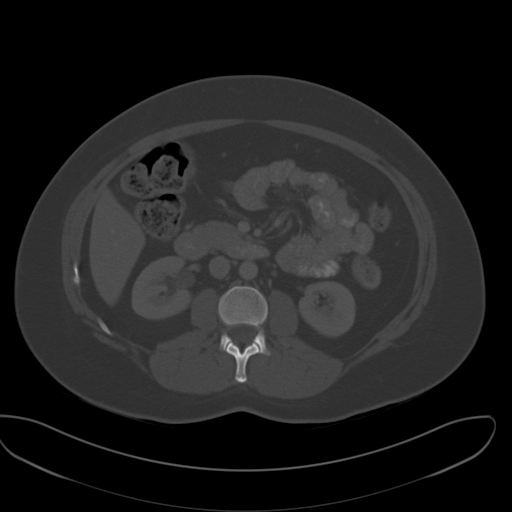
[im 69/97  soft-tissue]
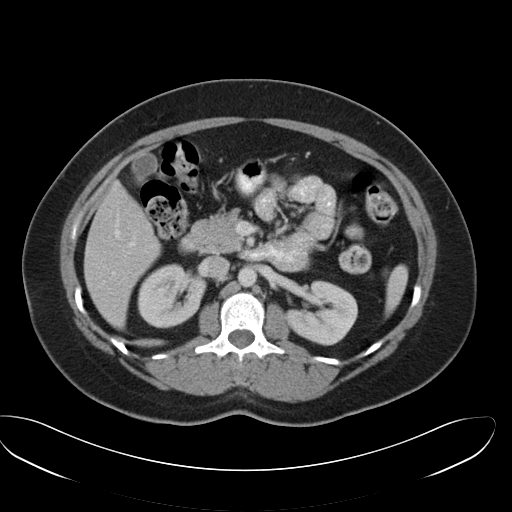
[im 77/97  soft-tissue]
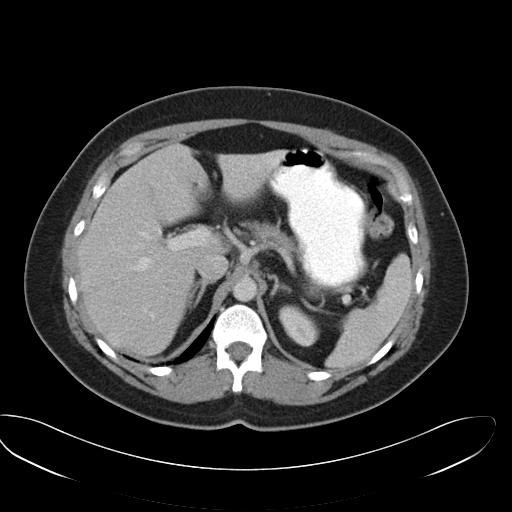
[im 81/97  lung]
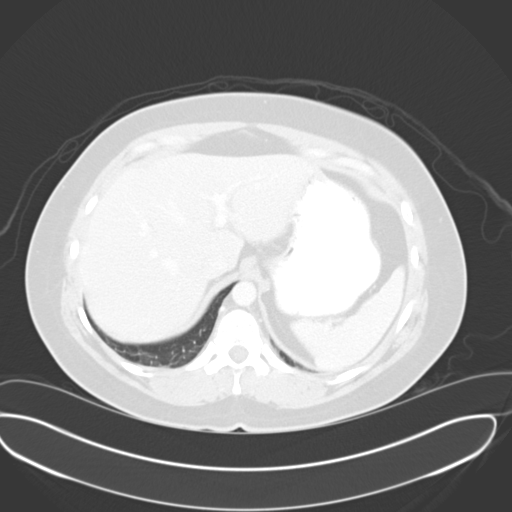
[im 85/97  soft-tissue]
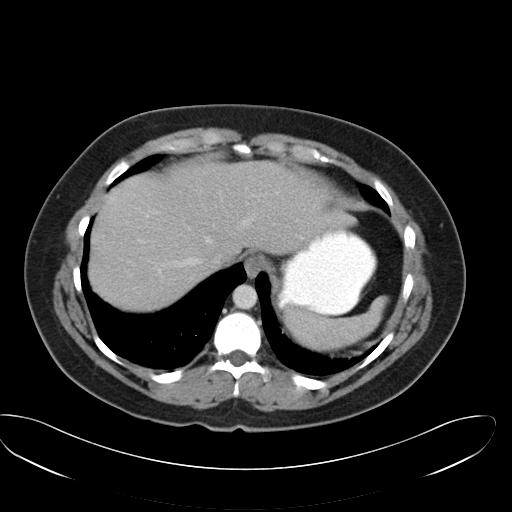
[im 85/97  lung]
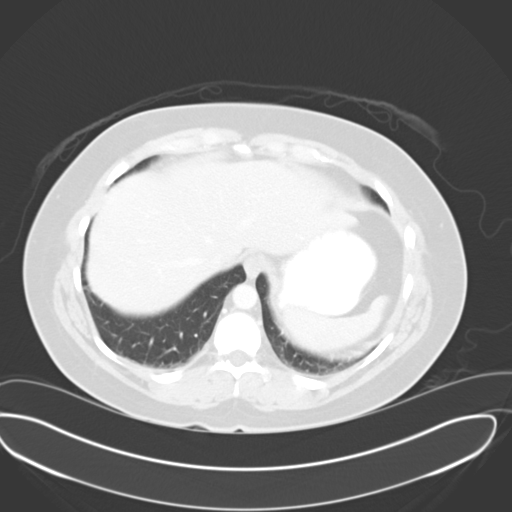
[im 89/97  lung]
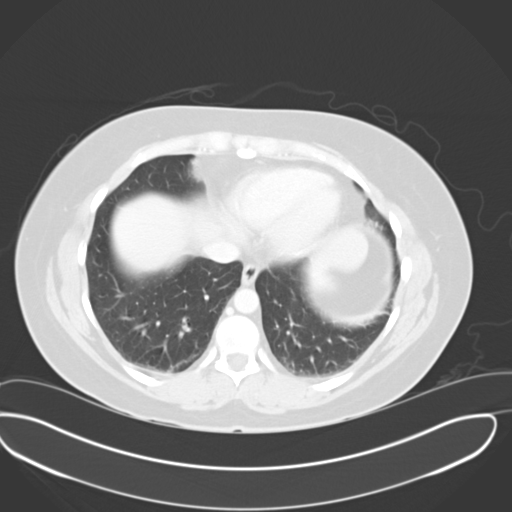
[im 93/97  soft-tissue]
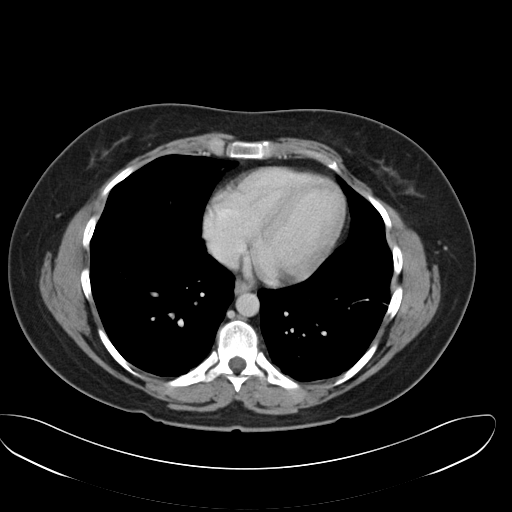
[im 93/97  lung]
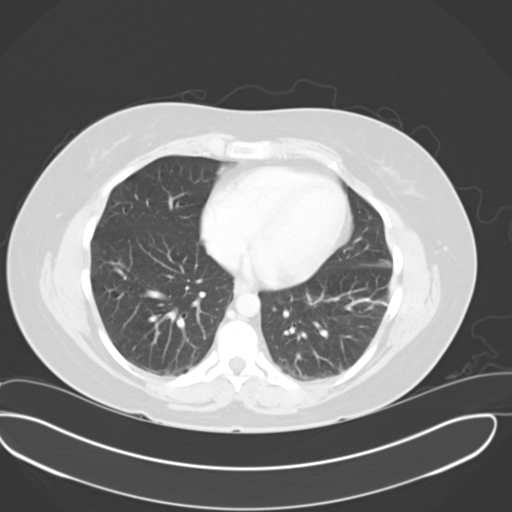

[15 of 32 positions shown; findings below may reference images not displayed]

FINDINGS: Lower chest: No significant pulmonary nodules or acute consolidative
airspace disease.

Hepatobiliary: Normal liver with no liver mass. Normal gallbladder
with no radiopaque cholelithiasis. No biliary ductal dilatation.

Pancreas: Normal, with no mass or duct dilation.

Spleen: Normal size. No mass.

Adrenals/Urinary Tract: Normal adrenals. Normal kidneys with no
hydronephrosis and no renal mass. Normal bladder.

Stomach/Bowel: Grossly normal stomach. Normal caliber small bowel
with no small bowel wall thickening. Normal appendix. There is a
subcentimeter mildly thick walled structure with central fat density
in the anterior pericolonic fat adjacent to the ascending colon
(best seen on sagittal series 6/image 31) with associated
surrounding fat stranding, in keeping with acute epiploic
appendagitis. There is no large bowel wall thickening or colonic
diverticulosis.

Vascular/Lymphatic: Normal caliber abdominal aorta. Patent portal,
splenic, hepatic and renal veins. No pathologically enlarged lymph
nodes in the abdomen or pelvis.

Reproductive: Grossly normal uterus.  No adnexal mass.

Other: No pneumoperitoneum, ascites or focal fluid collection.

Musculoskeletal: No aggressive appearing focal osseous lesions.
IMPRESSION: 1. Acute epiploic appendagitis adjacent to the anterior ascending
colon. This is a self-resolving condition for which supportive care
is indicated.
2. Otherwise normal CT abdomen/ pelvis. Normal appendix. No colonic
diverticulosis.

## 2018-12-12 DIAGNOSIS — F319 Bipolar disorder, unspecified: Secondary | ICD-10-CM | POA: Diagnosis not present

## 2018-12-12 DIAGNOSIS — G43709 Chronic migraine without aura, not intractable, without status migrainosus: Secondary | ICD-10-CM | POA: Diagnosis not present

## 2019-01-29 ENCOUNTER — Telehealth: Payer: Self-pay | Admitting: Family Medicine

## 2019-01-29 NOTE — Telephone Encounter (Signed)
Pt needing her vaccination records.  Also wanting to discuss  labs she may need done for a new job she is being hired for.  Please advise.  Thanks, [employer]

## 2019-01-29 NOTE — Telephone Encounter (Signed)
Immunization record printed and placed up front. Left message to call back.

## 2019-02-01 ENCOUNTER — Other Ambulatory Visit: Payer: Self-pay

## 2019-02-01 ENCOUNTER — Telehealth: Payer: Self-pay | Admitting: Family Medicine

## 2019-02-01 DIAGNOSIS — Z0184 Encounter for antibody response examination: Secondary | ICD-10-CM

## 2019-02-01 DIAGNOSIS — Z111 Encounter for screening for respiratory tuberculosis: Secondary | ICD-10-CM

## 2019-02-01 NOTE — Telephone Encounter (Signed)
Pt requesting a copy of her immunization records for new job.  She would like a call back to discuss if she is current on everything.   Pt needs to have info for new job by tomorrow if possible.

## 2019-02-02 ENCOUNTER — Other Ambulatory Visit: Payer: Self-pay | Admitting: Family Medicine

## 2019-02-02 ENCOUNTER — Other Ambulatory Visit: Payer: Self-pay

## 2019-02-02 DIAGNOSIS — Z0184 Encounter for antibody response examination: Secondary | ICD-10-CM

## 2019-02-02 DIAGNOSIS — Z111 Encounter for screening for respiratory tuberculosis: Secondary | ICD-10-CM | POA: Diagnosis not present

## 2019-02-04 LAB — QUANTIFERON-TB GOLD PLUS
QUANTIFERON TB1 AG VALUE: 0.03 [IU]/mL
QuantiFERON Mitogen Value: >10 IU/mL
QuantiFERON Nil Value: 0.03 IU/mL
QuantiFERON TB2 Ag Value: 0.03 IU/mL
QuantiFERON-TB Gold Plus: NEGATIVE

## 2019-02-04 LAB — VARICELLA ZOSTER ANTIBODY, IGG: VARICELLA: 1348 {index} (ref >165)

## 2019-02-06 ENCOUNTER — Telehealth: Payer: Self-pay

## 2019-02-06 NOTE — Telephone Encounter (Signed)
LMTCB

## 2019-02-06 NOTE — Telephone Encounter (Signed)
Advised patient of results.  

## 2019-02-06 NOTE — Telephone Encounter (Signed)
-----   Message from [name]., MD sent at 02/05/2019  1:55 PM EDT ----- TB negative, varicella immune.

## 2019-02-15 ENCOUNTER — Encounter (HOSPITAL_COMMUNITY): Payer: Self-pay

## 2019-02-15 ENCOUNTER — Inpatient Hospital Stay (HOSPITAL_COMMUNITY)
Admission: EM | Admit: 2019-02-15 | Discharge: 2019-02-17 | DRG: 917 | Disposition: A | Discharge status: Other Acute Inpatient Hospital | Payer: BLUE CROSS/BLUE SHIELD | Attending: Internal Medicine | Admitting: Internal Medicine

## 2019-02-15 DIAGNOSIS — T391X2A Poisoning by 4-Aminophenol derivatives, intentional self-harm, initial encounter: Secondary | ICD-10-CM | POA: Diagnosis not present

## 2019-02-15 DIAGNOSIS — N179 Acute kidney failure, unspecified: Secondary | ICD-10-CM | POA: Diagnosis present

## 2019-02-15 DIAGNOSIS — Z915 Personal history of self-harm: Secondary | ICD-10-CM

## 2019-02-15 DIAGNOSIS — Z79899 Other long term (current) drug therapy: Secondary | ICD-10-CM | POA: Diagnosis not present

## 2019-02-15 DIAGNOSIS — S51812A Laceration without foreign body of left forearm, initial encounter: Secondary | ICD-10-CM | POA: Diagnosis not present

## 2019-02-15 DIAGNOSIS — G92 Toxic encephalopathy: Secondary | ICD-10-CM | POA: Diagnosis present

## 2019-02-15 DIAGNOSIS — T1491XA Suicide attempt, initial encounter: Secondary | ICD-10-CM | POA: Diagnosis not present

## 2019-02-15 DIAGNOSIS — T887XXA Unspecified adverse effect of drug or medicament, initial encounter: Secondary | ICD-10-CM | POA: Diagnosis not present

## 2019-02-15 DIAGNOSIS — F603 Borderline personality disorder: Secondary | ICD-10-CM | POA: Diagnosis present

## 2019-02-15 DIAGNOSIS — X789XXA Intentional self-harm by unspecified sharp object, initial encounter: Secondary | ICD-10-CM | POA: Diagnosis not present

## 2019-02-15 DIAGNOSIS — F29 Unspecified psychosis not due to a substance or known physiological condition: Secondary | ICD-10-CM | POA: Diagnosis not present

## 2019-02-15 DIAGNOSIS — F319 Bipolar disorder, unspecified: Secondary | ICD-10-CM | POA: Diagnosis not present

## 2019-02-15 DIAGNOSIS — S56921A Laceration of unspecified muscles, fascia and tendons at forearm level, right arm, initial encounter: Secondary | ICD-10-CM | POA: Diagnosis not present

## 2019-02-15 DIAGNOSIS — T50901A Poisoning by unspecified drugs, medicaments and biological substances, accidental (unintentional), initial encounter: Secondary | ICD-10-CM | POA: Diagnosis present

## 2019-02-15 DIAGNOSIS — T50902A Poisoning by unspecified drugs, medicaments and biological substances, intentional self-harm, initial encounter: Secondary | ICD-10-CM

## 2019-02-15 DIAGNOSIS — S51811A Laceration without foreign body of right forearm, initial encounter: Secondary | ICD-10-CM

## 2019-02-15 DIAGNOSIS — Z818 Family history of other mental and behavioral disorders: Secondary | ICD-10-CM

## 2019-02-15 DIAGNOSIS — F909 Attention-deficit hyperactivity disorder, unspecified type: Secondary | ICD-10-CM | POA: Diagnosis not present

## 2019-02-15 DIAGNOSIS — Z23 Encounter for immunization: Secondary | ICD-10-CM | POA: Diagnosis not present

## 2019-02-15 DIAGNOSIS — R000 Tachycardia, unspecified: Secondary | ICD-10-CM | POA: Diagnosis not present

## 2019-02-15 DIAGNOSIS — D72829 Elevated white blood cell count, unspecified: Secondary | ICD-10-CM | POA: Diagnosis present

## 2019-02-15 DIAGNOSIS — T07XXXA Unspecified multiple injuries, initial encounter: Secondary | ICD-10-CM | POA: Diagnosis present

## 2019-02-15 DIAGNOSIS — F419 Anxiety disorder, unspecified: Secondary | ICD-10-CM | POA: Diagnosis present

## 2019-02-15 DIAGNOSIS — I10 Essential (primary) hypertension: Secondary | ICD-10-CM | POA: Diagnosis present

## 2019-02-15 DIAGNOSIS — F908 Attention-deficit hyperactivity disorder, other type: Secondary | ICD-10-CM | POA: Diagnosis not present

## 2019-02-15 DIAGNOSIS — G929 Unspecified toxic encephalopathy: Secondary | ICD-10-CM | POA: Diagnosis present

## 2019-02-15 DIAGNOSIS — R58 Hemorrhage, not elsewhere classified: Secondary | ICD-10-CM | POA: Diagnosis not present

## 2019-02-15 DIAGNOSIS — F314 Bipolar disorder, current episode depressed, severe, without psychotic features: Secondary | ICD-10-CM | POA: Diagnosis present

## 2019-02-15 DIAGNOSIS — T424X2A Poisoning by benzodiazepines, intentional self-harm, initial encounter: Secondary | ICD-10-CM

## 2019-02-15 HISTORY — DX: Poisoning by unspecified drugs, medicaments and biological substances, intentional self-harm, initial encounter: T50.902A

## 2019-02-15 LAB — CBC WITH DIFFERENTIAL/PLATELET
Abs Immature Granulocytes: 0.03 10*3/uL (ref 0.00–0.07)
BASOS PCT: 0 %
Basophils Absolute: 0 10*3/uL (ref 0.0–0.1)
Eosinophils Absolute: 0 10*3/uL (ref 0.0–0.5)
Eosinophils Relative: 0 %
HCT: 37.2 % (ref 36.0–46.0)
HEMOGLOBIN: 11.5 g/dL — AB (ref 12.0–15.0)
Immature Granulocytes: 0 %
LYMPHS PCT: 11 %
Lymphs Abs: 1.6 10*3/uL (ref 0.7–4.0)
MCH: 28 pg (ref 26.0–34.0)
MCHC: 30.9 g/dL (ref 30.0–36.0)
MCV: 90.7 fL (ref 80.0–100.0)
MONO ABS: 0.9 10*3/uL (ref 0.1–1.0)
Monocytes Relative: 7 %
NEUTROS ABS: 11.2 10*3/uL — AB (ref 1.7–7.7)
NEUTROS PCT: 82 %
Platelets: 339 10*3/uL (ref 150–400)
RBC: 4.1 MIL/uL (ref 3.87–5.11)
RDW: 13.3 % (ref 11.5–15.5)
WBC: 13.8 10*3/uL — AB (ref 4.0–10.5)
nRBC: 0 % (ref 0.0–0.2)

## 2019-02-15 LAB — TYPE AND SCREEN
ABO/RH(D): A POS
ANTIBODY SCREEN: NEGATIVE

## 2019-02-15 LAB — SALICYLATE LEVEL

## 2019-02-15 LAB — PROTIME-INR
INR: 1.1 (ref 0.8–1.2)
Prothrombin Time: 14.3 seconds (ref 11.4–15.2)

## 2019-02-15 LAB — COMPREHENSIVE METABOLIC PANEL
ALT: 19 U/L (ref 0–44)
ANION GAP: 10 (ref 5–15)
AST: 22 U/L (ref 15–41)
Albumin: 3.3 g/dL — ABNORMAL LOW (ref 3.5–5.0)
Alkaline Phosphatase: 73 U/L (ref 38–126)
BUN: 8 mg/dL (ref 6–20)
CHLORIDE: 106 mmol/L (ref 98–111)
CO2: 19 mmol/L — ABNORMAL LOW (ref 22–32)
Calcium: 8.7 mg/dL — ABNORMAL LOW (ref 8.9–10.3)
Creatinine, Ser: 1.29 mg/dL — ABNORMAL HIGH (ref 0.44–1.00)
GFR calc Af Amer: >60 mL/min (ref >60)
GFR calc non Af Amer: 53 mL/min — ABNORMAL LOW (ref >60)
GLUCOSE: 208 mg/dL — AB (ref 70–99)
POTASSIUM: 3.5 mmol/L (ref 3.5–5.1)
Sodium: 135 mmol/L (ref 135–145)
Total Bilirubin: 0.4 mg/dL (ref 0.3–1.2)
Total Protein: 6.2 g/dL — ABNORMAL LOW (ref 6.5–8.1)

## 2019-02-15 LAB — I-STAT BETA HCG BLOOD, ED (MC, WL, AP ONLY)

## 2019-02-15 LAB — ABO/RH: ABO/RH(D): A POS

## 2019-02-15 LAB — APTT: aPTT: 28 seconds (ref 24–36)

## 2019-02-15 LAB — ACETAMINOPHEN LEVEL: Acetaminophen (Tylenol), Serum: 58 ug/mL — ABNORMAL HIGH (ref 10–30)

## 2019-02-15 LAB — ETHANOL

## 2019-02-15 MED ORDER — TETANUS-DIPHTH-ACELL PERTUSSIS 5-2.5-18.5 LF-MCG/0.5 IM SUSP
0.5000 mL | Freq: Once | INTRAMUSCULAR | Status: AC
  Administered 2019-02-16: 0.5 mL via INTRAMUSCULAR
  Filled 2019-02-15: qty 0.5

## 2019-02-15 MED ORDER — ONDANSETRON HCL 4 MG PO TABS: 4.0000 mg | ORAL_TABLET | Freq: Four times a day (QID) | ORAL | Status: DC | PRN

## 2019-02-15 MED ORDER — SODIUM CHLORIDE 0.9 % IV BOLUS
1000.0000 mL | Freq: Once | INTRAVENOUS | Status: AC
  Administered 2019-02-15: 1000 mL via INTRAVENOUS

## 2019-02-15 MED ORDER — CEFAZOLIN SODIUM-DEXTROSE 1-4 GM/50ML-% IV SOLN
1.0000 g | Freq: Three times a day (TID) | INTRAVENOUS | Status: DC
  Administered 2019-02-16 – 2019-02-17 (×4): 1 g via INTRAVENOUS
  Filled 2019-02-15 (×6): qty 50

## 2019-02-15 MED ORDER — LIDOCAINE HCL (PF) 1 % IJ SOLN
30.0000 mL | Freq: Once | INTRAMUSCULAR | Status: AC
  Administered 2019-02-15: 30 mL

## 2019-02-15 MED ORDER — ONDANSETRON HCL 4 MG/2ML IJ SOLN: 4.0000 mg | Freq: Four times a day (QID) | INTRAMUSCULAR | Status: DC | PRN

## 2019-02-15 MED ORDER — HYDRALAZINE HCL 20 MG/ML IJ SOLN: 5.0000 mg | INTRAMUSCULAR | Status: DC | PRN

## 2019-02-15 MED ORDER — ENOXAPARIN SODIUM 40 MG/0.4ML ~~LOC~~ SOLN: 40.0000 mg | SUBCUTANEOUS | Status: DC

## 2019-02-15 MED ORDER — CEFAZOLIN SODIUM-DEXTROSE 1-4 GM/50ML-% IV SOLN
1.0000 g | Freq: Once | INTRAVENOUS | Status: AC
  Administered 2019-02-16: 1 g via INTRAVENOUS

## 2019-02-15 MED ORDER — SODIUM CHLORIDE 0.9 % IV BOLUS
1000.0000 mL | Freq: Once | INTRAVENOUS | Status: AC
  Administered 2019-02-16: 1000 mL via INTRAVENOUS

## 2019-02-15 MED ORDER — LIDOCAINE HCL (PF) 1 % IJ SOLN
INTRAMUSCULAR | Status: AC
  Administered 2019-02-15: 30 mL
  Filled 2019-02-15: qty 30

## 2019-02-15 NOTE — Progress Notes (Signed)
Pharmacy Antibiotic Note  Shelby Gallegos is a 38 y.o. female admitted on 02/15/2019 with suicide attempt w/ multiple lacerations and ingestion.  Pharmacy has been consulted for prophylactic Ancef dosing.  Plan: Ancef 1g IV Q8H.  Height: 5\' 7"  (170.2 cm) Weight: 213 lb (96.6 kg) IBW/kg (Calculated) : 61.6  Temp (24hrs), Avg:99 F (37.2 C), Min:99 F (37.2 C), Max:99 F (37.2 C)  Recent Labs  Lab 02/15/19 1920 02/15/19 1948  WBC 13.8*  --   CREATININE  --  1.29*    Estimated Creatinine Clearance: 71.3 mL/min (A) (by C-G formula based on SCr of 1.29 mg/dL (H)).    No Known Allergies   Thank you for allowing pharmacy to be a part of this patient's care.  Vernard Gambles, PharmD, BCPS  02/15/2019 11:47 PM

## 2019-02-15 NOTE — ED Notes (Signed)
ED Provider at bedside. 

## 2019-02-15 NOTE — ED Notes (Addendum)
Pur wick place on pt.

## 2019-02-15 NOTE — ED Provider Notes (Signed)
[facility] EMERGENCY DEPARTMENT Provider Note   CSN: [id number] Arrival date & time:       History   Chief Complaint Chief Complaint  Patient presents with  . Ingestion  . Laceration    HPI Shelby Gallegos is a 38 y.o. female.     HPI EMS called by patient's friend after making suicidal threats.  Found with multiple lacerations to bilateral forearms and admitted to taking #65  Xanax tablets and most of #2 230 ml bottles of NyQuil 10 to 15 minutes prior to their arrival at 1845.  Patient initially was awake and alert.  EMS estimates [phone] ml of blood loss at the scene.  Applied pressure bandages to both forearms.  Patient is very drowsy currently.  She is unable to contribute to history.  Level 5 caveat applies. Past Medical History:  Diagnosis Date  . ADHD (attention deficit hyperactivity disorder)   . Bipolar 1 disorder (HCC)   . Bipolar 1 disorder (HCC)   . Migraine   . Migraine     Patient Active Problem List   Diagnosis Date Noted  . Toxic encephalopathy 02/16/2019  . Suicide attempt (HCC)   . Overdose 02/15/2019  . Suicidal behavior with attempted self-injury (HCC) 02/15/2019  . AKI (acute kidney injury) (HCC) 02/15/2019  . Leukocytosis 02/15/2019  . Laceration of multiple sites of skin 02/15/2019  . Intentional acetaminophen overdose (HCC)   . History of suicide attempt 04/01/2017  . Borderline personality disorder (HCC) 02/16/2017  . Depression 08/21/2015  . ADHD (attention deficit hyperactivity disorder) 08/21/2015  . Headache, migraine 09/28/2014  . Abnormal ECG 11/26/2009  . Essential (primary) hypertension 11/26/2009  . Bipolar I disorder (HCC) 07/04/2008    Past Surgical History:  Procedure Laterality Date  . NO PAST SURGERIES       OB History   No obstetric history on file.      Home Medications    Prior to Admission medications   Medication Sig Start Date End Date Taking? Authorizing Provider  ALPRAZolam Prudy Feeler)  0.5 MG tablet Take 0.5 mg by mouth 2 (two) times daily as needed for anxiety.  03/21/17   [provider]  amphetamine-dextroamphetamine (ADDERALL) 20 MG tablet Take 20 mg by mouth 3 (three) times daily.     [provider]  Erenumab-aooe (AIMOVIG, 140 MG DOSE,) 70 MG/ML SOAJ Inject 70 mg into the skin every 30 (thirty) days.    [provider]  lamoTRIgine (LAMICTAL) 150 MG tablet Take 150 mg by mouth 2 (two) times daily. 12/21/18   [provider]  topiramate (TOPAMAX) 100 MG tablet  05/19/17   [provider]    Family History Family History  Problem Relation Age of Onset  . Atrial fibrillation Mother   . Bipolar disorder Paternal Uncle     Social History Social History   Tobacco Use  . Smoking status: Never Smoker  . Smokeless tobacco: Never Used  Substance Use Topics  . Alcohol use: No  . Drug use: No     Allergies   Patient has no known allergies.   Review of Systems Review of Systems  Unable to perform ROS: Patient nonverbal     Physical Exam Updated Vital Signs BP (!) 94/55   Pulse 79   Temp 98.3 F (36.8 C) (Oral)   Resp 14   Ht  (1.702 m)   Wt 96.6 kg   SpO2 98%   BMI 33.36 kg/m   Physical Exam Vitals signs  and nursing note reviewed.  Constitutional:      Appearance: She is well-developed.     Comments: Somnolent with sonorous respirations  HENT:     Head: Normocephalic and atraumatic.     Comments: No obvious head trauma.  No intraoral trauma.    Nose: Nose normal.     Mouth/Throat:     Mouth: Mucous membranes are moist.  Eyes:     Pupils: Pupils are equal, round, and reactive to light.  Neck:     Musculoskeletal: Normal range of motion and neck supple. No neck rigidity.  Cardiovascular:     Rate and Rhythm: Normal rate and regular rhythm.     Heart sounds: No murmur. No friction rub. No gallop.   Pulmonary:     Effort: Pulmonary effort is normal. No respiratory distress.     Breath sounds:  Normal breath sounds. No stridor. No wheezing, rhonchi or rales.  Chest:     Chest wall: No tenderness.  Abdominal:     General: Bowel sounds are normal.     Palpations: Abdomen is soft.     Tenderness: There is no abdominal tenderness. There is no guarding or rebound.  Musculoskeletal: Normal range of motion.        General: No tenderness.  Lymphadenopathy:     Cervical: No cervical adenopathy.  Skin:    General: Skin is warm and dry.     Findings: No erythema or rash.     Comments: Patient with multiple lacerations into the subcutaneous adipose tissue of bilateral forearms.  Appears to have some active bleeding in the right forearm lacerations but does not appear to be arterial..  Dressings reapplied.  Good distal cap refill.  Appears to move all joints without deficit though this is difficult to assess completely.  Neurological:     Mental Status: She is alert.     Comments: Responds only to painful stimuli.  Appears to be moving all extremities without focal deficit.  Psychiatric:        Behavior: Behavior normal.      ED Treatments / Results  Labs (all labs ordered are listed, but only abnormal results are displayed) Labs Reviewed  COMPREHENSIVE METABOLIC PANEL - Abnormal; Notable for the following components:      Result Value   CO2 19 (*)    Glucose, Bld 208 (*)    Creatinine, Ser 1.29 (*)    Calcium 8.7 (*)    Total Protein 6.2 (*)    Albumin 3.3 (*)    GFR calc non Af Amer 53 (*)    All other components within normal limits  ACETAMINOPHEN LEVEL - Abnormal; Notable for the following components:   Acetaminophen (Tylenol), Serum 58 (*)    All other components within normal limits  CBC WITH DIFFERENTIAL/PLATELET - Abnormal; Notable for the following components:   WBC 13.8 (*)    Hemoglobin 11.5 (*)    Neutro Abs 11.2 (*)    All other components within normal limits  ACETAMINOPHEN LEVEL - Abnormal; Notable for the following components:   Acetaminophen (Tylenol),  Serum 60 (*)    All other components within normal limits  CBC - Abnormal; Notable for the following components:   RBC 3.42 (*)    Hemoglobin 9.9 (*)    HCT 31.3 (*)    All other components within normal limits  COMPREHENSIVE METABOLIC PANEL - Abnormal; Notable for the following components:   Chloride 112 (*)    CO2 20 (*)  BUN 5 (*)    Calcium 7.7 (*)    Total Protein 5.3 (*)    Albumin 2.7 (*)    All other components within normal limits  MRSA PCR SCREENING  CULTURE, BLOOD (ROUTINE X 2)  CULTURE, BLOOD (ROUTINE X 2)  ETHANOL  SALICYLATE LEVEL  PROTIME-INR  APTT  HIV ANTIBODY (ROUTINE TESTING W REFLEX)  ACETAMINOPHEN LEVEL  CBC WITH DIFFERENTIAL/PLATELET  RAPID URINE DRUG SCREEN, HOSP PERFORMED  CBC  BASIC METABOLIC PANEL  I-STAT BETA HCG BLOOD, ED ([facility], [facility], [facility] ONLY)  TYPE AND SCREEN  ABO/RH    EKG EKG Interpretation  Date/Time:  Thursday February 15 2019 19:27:44 EDT Ventricular Rate:  106 PR Interval:    QRS Duration: 86 QT Interval:  326 QTC Calculation: 433 R Axis:   31 Text Interpretation:  Sinus tachycardia Confirmed by Loren Racer ([id number]) on 02/15/2019 11:16:41 PM   Radiology No results found.  Procedures .Marland KitchenLaceration Repair Date/Time: 02/15/2019 10:59 PM Performed by: Loren Racer, MD Authorized by: Loren Racer, MD   Consent:    Consent obtained:  Emergent situation Laceration details:    Location:  Shoulder/arm   Shoulder/arm location:  L lower arm   Length (cm):  20   Depth (mm):  5 Repair type:    Repair type:  Simple Exploration:    Wound exploration: wound explored through full range of motion and entire depth of wound probed and visualized     Wound extent: no foreign bodies/material noted, no muscle damage noted and no tendon damage noted     Contaminated: no   Treatment:    Area cleansed with:  Shur-Clens   Amount of cleaning:  Standard   Irrigation method:  Pressure wash   Visualized foreign bodies/material removed:  no   Skin repair:    Repair method:  Staples   Number of staples:  22 Approximation:    Approximation:  Close Post-procedure details:    Dressing:  Antibiotic ointment and non-adherent dressing   Patient tolerance of procedure:  Tolerated with difficulty .Marland KitchenLaceration Repair Date/Time: 02/15/2019 11:01 PM Performed by: Loren Racer, MD Authorized by: Loren Racer, MD   Consent:    Consent obtained:  Emergent situation Laceration details:    Location:  Shoulder/arm   Shoulder/arm location:  R lower arm   Length (cm):  16   Depth (mm):  5 Repair type:    Repair type:  Simple Exploration:    Wound extent: tendon damage     Wound extent: no foreign bodies/material noted and no muscle damage noted     Tendon damage location:  Upper extremity   Upper extremity tendon damage location:  Forearm flexor   Tendon damage extent:  Partial transection   Tendon repair plan:  Refer for evaluation   Contaminated: no   Treatment:    Area cleansed with:  Shur-Clens   Irrigation method:  Pressure wash   Visualized foreign bodies/material removed: no   Skin repair:    Repair method:  Staples   Number of staples:  15 Approximation:    Approximation:  Close Post-procedure details:    Dressing:  Antibiotic ointment and sterile dressing   Patient tolerance of procedure:  Tolerated with difficulty   (including critical care time)  Medications Ordered in ED Medications  ondansetron (ZOFRAN) tablet 4 mg (has no administration in time range)    Or  ondansetron (ZOFRAN) injection 4 mg (has no administration in time range)  hydrALAZINE (APRESOLINE) injection 5 mg (has no administration in  time range)  ceFAZolin (ANCEF) IVPB 1 g/50 mL premix (1 g Intravenous New Bag/Given 02/16/19 1312)  MEDLINE mouth rinse (15 mLs Mouth Rinse Given 02/16/19 1009)  0.9 %  sodium chloride infusion ( Intravenous Rate/Dose Change 02/16/19 1303)  enoxaparin (LOVENOX) injection 40 mg (40 mg Subcutaneous Given  02/16/19 1311)  sodium chloride 0.9 % bolus 1,000 mL (0 mLs Intravenous Stopped 02/15/19 2220)  lidocaine (PF) (XYLOCAINE) 1 % injection 30 mL (30 mLs Infiltration Given by Other 02/15/19 2255)  ceFAZolin (ANCEF) IVPB 1 g/50 mL premix (1 g Intravenous New Bag/Given 02/16/19 0121)  Tdap (BOOSTRIX) injection 0.5 mL (0.5 mLs Intramuscular Given 02/16/19 0301)  sodium chloride 0.9 % bolus 1,000 mL (1,000 mLs Intravenous New Bag/Given 02/16/19 0120)    CRITICAL CARE Performed by: Loren Racer Total critical care time: 45 minutes Critical care time was exclusive of separately billable procedures and treating other patients. Critical care was necessary to treat or prevent imminent or life-threatening deterioration. Critical care was time spent personally by me on the following activities: development of treatment plan with patient and/or surrogate as well as nursing, discussions with consultants, evaluation of patient's response to treatment, examination of patient, obtaining history from patient or surrogate, ordering and performing treatments and interventions, ordering and review of laboratory studies, ordering and review of radiographic studies, pulse oximetry and re-evaluation of patient's condition. Initial Impression / Assessment and Plan / ED Course  I have reviewed the triage vital signs and the nursing notes.  Pertinent labs & imaging results that were available during my care of the patient were reviewed by me and considered in my medical decision making (see chart for details).       Patient with multiple bilateral forearm lacerations that extend into the subcutaneous adipose tissue.  Patient has a transverse laceration across the flexor surface of the right wrist. Able to visualize flexor tendon with partial injury but appears to be intact.  Irrigated and skin covered.  Patient was very combative during this exam an repair.  And it was difficult to fully assess all of her wounds.  She is  protecting her airway.  Vital signs are stable.  Discussed with hospitalist regarding admission for observation and follow-up on 4-hour Tylenol level.  Will discuss with hand surgery, cover with prophylactic antibiotics and update tetanus. Discussed with Dr. Merlyn Lot who will consult on patient and reexamine when she is more awake and alert.  Final Clinical Impressions(s) / ED Diagnoses   Final diagnoses:  Intentional acetaminophen overdose, initial encounter (HCC)  Benzodiazepine overdose, intentional self-harm, initial encounter (HCC)  Laceration of right forearm with tendon involvement, initial encounter    ED Discharge Orders    None       Loren Racer, MD 02/16/19 1728

## 2019-02-15 NOTE — ED Notes (Signed)
Niu, MD at bedside. °

## 2019-02-15 NOTE — ED Triage Notes (Signed)
Pt arrives via [facility], pt arrives due to suicide attempt with lacerations to both forearms with estimated [phone] mL blood loss. Pt took 65, 0.5 mg xanax tablets and 1 bottle of nyquil (possibly two different kinds). Pt alert on EMS arrival, now alert with stimuli, but very drowsy.   RN called poison control, states need tylenol level at 4 hours post ingestion, EKG, fluids, airway management and monitor for tachycardia. Poison control does not recommend charcoal r/t drowsiness. Care is symptom management. EDP notified.

## 2019-02-15 NOTE — ED Notes (Signed)
Bladder Scan: approx 170 mL

## 2019-02-15 NOTE — ED Notes (Signed)
ED TO INPATIENT HANDOFF REPORT  ED Nurse Name and Phone #:  Alan Ripperndrew RN [phone]  S Name/Age/Gender Shelby Gallegos 38 y.o. female Room/Bed: 026C/026C  Code Status   Code Status: Full Code  Home/SNF/Other Home Patient oriented to: self, place, time and situation Is this baseline? Yes   Triage Complete: Triage complete  Chief Complaint Z04.6, OD, R arm laceration  Triage Note Pt arrives via [facility], pt arrives due to suicide attempt with lacerations to both forearms with estimated [phone] mL blood loss. Pt took 65, 0.5 mg xanax tablets and 1 bottle of nyquil (possibly two different kinds). Pt alert on EMS arrival, now alert with stimuli, but very drowsy.   RN called poison control, states need tylenol level at 4 hours post ingestion, EKG, fluids, airway management and monitor for tachycardia. Poison control does not recommend charcoal r/t drowsiness. Care is symptom management. EDP notified.     Allergies No Known Allergies  Level of Care/Admitting Diagnosis ED Disposition    ED Disposition Condition Comment   Admit  [facility] [[id number][facility]  Level of Care: Progressive [102]  Diagnosis: Overdose [147829][[id number]]  Admitting Physician: Lorretta HarpNIU, XILIN [[id number]]  Attending Physician: Lorretta HarpNIU, XILIN [phone][[id number]]  Estimated length of stay: past midnight tomorrow  Certification:: I certify this patient will need inpatient services for at least 2 midnights  PT Class (Do Not Modify): Inpatient [101]  PT Acc Code (Do Not Modify): Private [1]       B Medical/Surgery History Past Medical History:  Diagnosis Date  . ADHD (attention deficit hyperactivity disorder)   . Bipolar 1 disorder (HCC)   . Bipolar 1 disorder (HCC)   . Migraine   . Migraine    Past Surgical History:  Procedure Laterality Date  . NO PAST SURGERIES       A IV Location/Drains/Wounds Patient Lines/Drains/Airways Status   Active Line/Drains/Airways    Name:   Placement date:   Placement time:   Site:    Days:   Peripheral IV 02/15/19 Left Antecubital   02/15/19    1927    Antecubital   less than 1   Peripheral IV 02/15/19 Right Antecubital   02/15/19    1951    Antecubital   less than 1   External Urinary Catheter   02/15/19    2003    -   less than 1          Intake/Output Last 24 hours  Intake/Output Summary (Last 24 hours) at 02/15/2019 2355 Last data filed at 02/15/2019 2220 Gross per 24 hour  Intake 1000 ml  Output -  Net 1000 ml    Labs/Imaging Results for orders placed or performed during the hospital encounter of 02/15/19 (from the past 48 hour(s))  CBC with Differential/Platelet     Status: Abnormal   Collection Time: 02/15/19  7:20 PM  Result Value Ref Range   WBC 13.8 (H) 4.0 - 10.5 K/uL   RBC 4.10 3.87 - 5.11 MIL/uL   Hemoglobin 11.5 (L) 12.0 - 15.0 g/dL   HCT 30.837.2 65.736.0 - 84.646.0 %   MCV 90.7 80.0 - 100.0 fL   MCH 28.0 26.0 - 34.0 pg   MCHC 30.9 30.0 - 36.0 g/dL   RDW 96.213.3 95.211.5 - 84.115.5 %   Platelets 339 150 - 400 K/uL   nRBC 0.0 0.0 - 0.2 %   Neutrophils Relative % 82 %   Neutro Abs 11.2 (H) 1.7 - 7.7 K/uL   Lymphocytes Relative 11 %  Lymphs Abs 1.6 0.7 - 4.0 K/uL   Monocytes Relative 7 %   Monocytes Absolute 0.9 0.1 - 1.0 K/uL   Eosinophils Relative 0 %   Eosinophils Absolute 0.0 0.0 - 0.5 K/uL   Basophils Relative 0 %   Basophils Absolute 0.0 0.0 - 0.1 K/uL   Immature Granulocytes 0 %   Abs Immature Granulocytes 0.03 0.00 - 0.07 K/uL    Comment: Performed at [facility], [address], [place], [place] [address]  Comprehensive metabolic panel     Status: Abnormal   Collection Time: 02/15/19  7:48 PM  Result Value Ref Range   Sodium 135 135 - 145 mmol/L   Potassium 3.5 3.5 - 5.1 mmol/L   Chloride 106 98 - 111 mmol/L   CO2 19 (L) 22 - 32 mmol/L   Glucose, Bld 208 (H) 70 - 99 mg/dL   BUN 8 6 - 20 mg/dL   Creatinine, Ser 1.63 (H) 0.44 - 1.00 mg/dL   Calcium 8.7 (L) 8.9 - 10.3 mg/dL   Total Protein 6.2 (L) 6.5 - 8.1 g/dL   Albumin 3.3 (L) 3.5  - 5.0 g/dL   AST 22 15 - 41 U/L   ALT 19 0 - 44 U/L   Alkaline Phosphatase 73 38 - 126 U/L   Total Bilirubin 0.4 0.3 - 1.2 mg/dL   GFR calc non Af Amer 53 (L) >60 mL/min   GFR calc Af Amer >60 >60 mL/min   Anion gap 10 5 - 15    Comment: Performed at [facility], [address], [place], [place] [address]  Ethanol     Status: None   Collection Time: 02/15/19  7:48 PM  Result Value Ref Range   Alcohol, Ethyl (B) <10 <10 mg/dL    Comment: (NOTE) Lowest detectable limit for serum alcohol is 10 mg/dL. For medical purposes only. Performed at [facility], [address], [place], [place] [address]   Acetaminophen level     Status: Abnormal   Collection Time: 02/15/19  7:48 PM  Result Value Ref Range   Acetaminophen (Tylenol), Serum 58 (H) 10 - 30 ug/mL    Comment: (NOTE) Therapeutic concentrations vary significantly. A range of 10-30 ug/mL  may be an effective concentration for many patients. However, some  are best treated at concentrations outside of this range. Acetaminophen concentrations >150 ug/mL at 4 hours after ingestion  and >50 ug/mL at 12 hours after ingestion are often associated with  toxic reactions. Performed at [facility], [address] [place], [place] [address]   Salicylate level     Status: None   Collection Time: 02/15/19  7:48 PM  Result Value Ref Range   Salicylate Lvl <7.0 2.8 - 30.0 mg/dL    Comment: Performed at [facility], [address], [place], [place] [address]  Protime-INR     Status: None   Collection Time: 02/15/19  7:48 PM  Result Value Ref Range   Prothrombin Time 14.3 11.4 - 15.2 seconds   INR 1.1 0.8 - 1.2    Comment: (NOTE) INR goal varies based on device and disease states. Performed at [facility], [address], [place], [place] [address]   APTT     Status: None   Collection Time: 02/15/19  7:48 PM  Result Value Ref Range   aPTT 28 24 - 36 seconds    Comment: Performed at [facility], [address] [place], [place] [address]  I-Stat Beta hCG blood, ED ([facility], [facility], [facility] only)     Status: None   Collection Time: 02/15/19  7:56 PM  Result Value Ref Range   I-stat hCG, quantitative <5.0 <5 mIU/mL   Comment 3            Comment:   GEST. AGE      CONC.  (mIU/mL)   <=1 WEEK        5 - 50     2 WEEKS       50 - 500     3 WEEKS       100 - 10,000     4 WEEKS     1,000 - 30,000        FEMALE AND NON-PREGNANT FEMALE:     LESS THAN 5 mIU/mL   Type and screen     Status: None   Collection Time: 02/15/19  8:00 PM  Result Value Ref Range   ABO/RH(D) A POS    Antibody Screen NEG    Sample Expiration      02/18/2019 Performed at [facility], [address], [place], [place] [address]   ABO/Rh     Status: None   Collection Time: 02/15/19  8:00 PM  Result Value Ref Range   ABO/RH(D)      A POS Performed at [facility], [address] [place], [place] [address]    No results found.  Pending Labs Unresulted Labs (From admission, onward)    Start     Ordered   02/16/19 0500  CBC  Tomorrow morning,   R     02/15/19 2340   02/16/19 0500  Comprehensive metabolic panel  Tomorrow morning,   R     02/15/19 2341   02/15/19 2339  HIV antibody (Routine Testing)  Once,   R     02/15/19 2340   02/15/19 2338  Culture, blood (Routine X 2) w Reflex to ID Panel  BLOOD CULTURE X 2,   R    Comments:  Please obtain prior to antibiotic administration.    02/15/19 2338   02/15/19 2251  Acetaminophen level  ONCE - STAT,   STAT     02/15/19 2250   02/15/19 1948  CBC with Differential/Platelet  Once,   R     02/15/19 1947   02/15/19 1948  Rapid urine drug screen (hospital performed)  ONCE - STAT,   R     02/15/19 1947          Vitals/Pain Today's Vitals   02/15/19 2100 02/15/19 2130 02/15/19 2230 02/15/19 2315  BP: 112/63 (!) 110/56 102/81   Pulse: (!) 105 100 87 96  Resp: (!) (!) 21  Temp:      TempSrc:      SpO2: 100% 100% 100% 100%  Weight:     96.6 kg  Height:     (1.702 m)  PainSc:        Isolation Precautions No active isolations  Medications Medications  ceFAZolin (ANCEF) IVPB 1 g/50 mL premix (has no administration in time range)  Tdap (BOOSTRIX) injection 0.5 mL (has no administration in time range)  sodium chloride 0.9 % bolus 1,000 mL (has no administration in time range)  enoxaparin (LOVENOX) injection 40 mg (has no administration in time range)  ondansetron (ZOFRAN) tablet 4 mg (has no administration in time range)    Or  ondansetron (ZOFRAN)  injection 4 mg (has no administration in time range)  hydrALAZINE (APRESOLINE) injection 5 mg (has no administration in time range)  ceFAZolin (ANCEF) IVPB 1 g/50 mL premix (has no administration in time range)  sodium chloride 0.9 % bolus 1,000 mL (0 mLs Intravenous Stopped 02/15/19 2220)  lidocaine (PF) (XYLOCAINE) 1 % injection 30 mL (30 mLs Infiltration Given by Other 02/15/19 2255)    Mobility non-ambulatory     Focused Assessments Neuro Assessment Handoff:            R Recommendations: See Admitting Provider Note  Report given to:   Additional Notes:

## 2019-02-15 NOTE — ED Notes (Signed)
All belongings given to family. Family still at bedside, but confirmed they will take all belongings and medications home.

## 2019-02-15 NOTE — H&P (Addendum)
History and Physical    Shelby Gallegos MBE:[id number] DOB: 1981/10/28 DOA: 02/15/2019  Referring MD/NP/PA:   PCP: [name]., MD   Patient coming from:  The patient is coming from home.  At baseline, pt is independent for most of ADL.        Chief Complaint: suicidal attempt and overdose  HPI: Shelby Gallegos is a 38 y.o. female with medical history significant of hypertension, depression, anxiety, ADHD, bipolar disorder, who presents with suicidal attempt and overdose.  Per her sister, pt attempted suicide at about 6 PM. EMS called by patient's friend after making suicidal threats. Per report, she admitted to taking #65 of 5mg  Xanax tablets and most of #2 of 230 ml bottles of NyQuil. She was found with multiple lacerations to bilateral forearms. EMS estimates [phone] ml of blood loss at the scene. Patient initially was awake and alert, but has become more and more drowsy, then becomes barely arousable. She is unable to contribute to history when I saw pt. Patient does not have active cough, respiratory distress, nausea vomiting, diarrhea noted.  ED Course: pt was found to have Tylenol level 58 at 1920, negative pregnancy test, alcohol less than 10, salicylate level less than 7, INR 1.1, PTT 28, AKI with creatinine 1.29, BUN 8, pending UDS, temperature 99, oxygen saturation 99% on room air, blood pressure 102/81.  QTc 433 on EKG.  Patient is admitted to stepdown as inpatient.  Poison control was consulted by EDP.  Hand surgeon, Dr. Merlyn Lot was consulted by EDP.  Review of Systems: Could not be reviewed due to altered mental status.   Allergy: No Known Allergies  Past Medical History:  Diagnosis Date  . ADHD (attention deficit hyperactivity disorder)   . Bipolar 1 disorder (HCC)   . Bipolar 1 disorder (HCC)   . Migraine   . Migraine     Past Surgical History:  Procedure Laterality Date  . NO PAST SURGERIES      Social History:  reports that she has never smoked. She has  never used smokeless tobacco. She reports that she does not drink alcohol or use drugs.  Family History:  Family History  Problem Relation Age of Onset  . Atrial fibrillation Mother   . Bipolar disorder Paternal Uncle      Prior to Admission medications   Medication Sig Start Date End Date Taking? Authorizing Provider  ALPRAZolam Prudy Feeler) 0.5 MG tablet  03/21/17   [provider]  amphetamine-dextroamphetamine (ADDERALL) 20 MG tablet Take 20 mg by mouth 3 (three) times daily.     [provider]  gabapentin (NEURONTIN) 600 MG tablet  02/28/17   [provider]  lamoTRIgine (LAMICTAL) 200 MG tablet 1 tablet oral daily 07/04/08   [provider]  Lurasidone HCl (LATUDA) 60 MG TABS Take 1 tablet by mouth daily.    [provider]  propranolol (INDERAL) 40 MG tablet  05/19/17   [provider]  SUMAtriptan (IMITREX) 6 MG/0.5ML SOLN injection Inject 6 mg into the skin every 2 (two) hours as needed for migraine or headache. May repeat in 2 hours if headache persists or recurs.    [provider]  topiramate (TOPAMAX) 100 MG tablet  05/19/17   [provider]    Physical Exam: Vitals:   02/15/19 2130 02/15/19 2230 02/15/19 2315 02/16/19 0005  BP: (!) 110/56 102/81  103/66  Pulse: 100 87 96 90  Resp: 20 17 (!) 21 (!) 21  Temp:  TempSrc:      SpO2: 100% 100% 100% 100%  Weight:   96.6 kg   Height:   5\' 7"  (1.702 m)    General: Not in acute distress HEENT:       Eyes: PERRL, EOMI, no scleral icterus.       ENT: No discharge from the ears and nose, no pharynx injection, no tonsillar enlargement.        Neck: No JVD, no bruit, no mass felt. Heme: No neck lymph node enlargement. Cardiac: S1/S2, RRR, No murmurs, No gallops or rubs. Respiratory: No rales, wheezing, rhonchi or rubs. GI: Soft, nondistended, nontender, no organomegaly, BS present. GU: No hematuria Ext: No pitting leg edema bilaterally. 2+DP/PT pulse  bilaterally. Musculoskeletal: No joint deformities, No joint redness or warmth, no limitation of ROM in spin. Skin: has multiple laceration into the subcutaneous adipose tissue in both forearms, bleeding stopped. dose not appear to have arterial damage. Has good distal capillary refill.  Neuro: Patient is barely arousable, slightly responds to painful stimuli.  Appears to be moving all extremities on painful stimuli.  Psych: has suicidal attempt  Labs on Admission: I have personally reviewed following labs and imaging studies  CBC: Recent Labs  Lab 02/15/19 1920  WBC 13.8*  NEUTROABS 11.2*  HGB 11.5*  HCT 37.2  MCV 90.7  PLT 339   Basic Metabolic Panel: Recent Labs  Lab 02/15/19 1948  NA 135  K 3.5  CL 106  CO2 19*  GLUCOSE 208*  BUN 8  CREATININE 1.29*  CALCIUM 8.7*   GFR: Estimated Creatinine Clearance: 71.3 mL/min (A) (by C-G formula based on SCr of 1.29 mg/dL (H)). Liver Function Tests: Recent Labs  Lab 02/15/19 1948  AST 22  ALT 19  ALKPHOS 73  BILITOT 0.4  PROT 6.2*  ALBUMIN 3.3*   No results for input(s): LIPASE, AMYLASE in the last 168 hours. No results for input(s): AMMONIA in the last 168 hours. Coagulation Profile: Recent Labs  Lab 02/15/19 1948  INR 1.1   Cardiac Enzymes: No results for input(s): CKTOTAL, CKMB, CKMBINDEX, TROPONINI in the last 168 hours. BNP (last 3 results) No results for input(s): PROBNP in the last 8760 hours. HbA1C: No results for input(s): HGBA1C in the last 72 hours. CBG: No results for input(s): GLUCAP in the last 168 hours. Lipid Profile: No results for input(s): CHOL, HDL, LDLCALC, TRIG, CHOLHDL, LDLDIRECT in the last 72 hours. Thyroid Function Tests: No results for input(s): TSH, T4TOTAL, FREET4, T3FREE, THYROIDAB in the last 72 hours. Anemia Panel: No results for input(s): VITAMINB12, FOLATE, FERRITIN, TIBC, IRON, RETICCTPCT in the last 72 hours. Urine analysis:    Component Value Date/Time   COLORURINE  YELLOW (A) 12/15/2015 0910   APPEARANCEUR CLEAR (A) 12/15/2015 0910   LABSPEC 1.013 12/15/2015 0910   PHURINE 7.0 12/15/2015 0910   GLUCOSEU NEGATIVE 12/15/2015 0910   HGBUR 2+ (A) 12/15/2015 0910   BILIRUBINUR NEGATIVE 12/15/2015 0910   KETONESUR NEGATIVE 12/15/2015 0910   PROTEINUR NEGATIVE 12/15/2015 0910   NITRITE NEGATIVE 12/15/2015 0910   LEUKOCYTESUR TRACE (A) 12/15/2015 0910   Sepsis Labs: @LABRCNTIP (procalcitonin:4,lacticidven:4) )No results found for this or any previous visit (from the past 240 hour(s)).   Radiological Exams on Admission: No results found.   EKG: Independently reviewed.  Not done in ED, will get one.   Assessment/Plan Principal Problem:   Suicidal behavior with attempted self-injury [facility]) Active Problems:   Bipolar I disorder (HCC)   Essential (primary) hypertension   Depression  ADHD (attention deficit hyperactivity disorder)   Borderline personality disorder (HCC)   Overdose   AKI (acute kidney injury) (HCC)   Leukocytosis   Laceration of multiple sites of skin   Toxic encephalopathy   Suicidal behavior with attempted self-injury, and toxic encephalopathy: pt is barely arousable, but airway seems to be protected.  Oxygen saturation 99% on room air.  -will admit to SDU as inpt -Frequent neuro check -Psych consult is ordered -Suicidal precaution sitter at bedside  Laceration of multiple sites of skin: Patient has multiple skin laceration in forearms, does not seem to have arterial damage.  Not sure if patient has any deep tendon injury.  Hand surgeon, Dr. Merlyn Lot was consulted by EDP. -Started Ancef for prophylaxis -Follow-up Dr. Merrilee Seashore recommendation -Wound care consult  Overdose of Xanax and Nyquill: She took #65 of  Xanax tablets and most of #2 of 230 ml bottles of NyQuil. Initially acetaminophen level 58 (this is approximately 1.5 hours after overdose. Pending Tylenol level at 4-hour after overdose. Poison control was contacted, "RN  called poison control, states need tylenol level at 4 hours post ingestion, EKG, fluids, airway management and monitor for tachycardia. Poison control does not recommend charcoal r/t drowsiness. Care is symptom management." -f/u 4-hour Tylenol level to decide if pt needs to be treated with acetylcysteine -Frequent neuro check  Addendum: the repeated tylenol level at about 4 hours after overdose is 60. Dicussed with pharmacist, who consulted poison control again. Per posion control recommendation, patient does not need to be treated with acetylcysteine. -Will recheck Dilantin level at 7 AM  Essential (primary) hypertension: hold propranolol -IV hydralazine as needed  Psych issues including bipolar I disorder (HCC), Depression, ADHD (attention deficit hyperactivity disorder), Borderline personality disorder (HCC) and anxiety: -Hold all psych medications now --Psych consult is ordered   AKI (acute kidney injury) (HCC): mild. Cre 1.29, BUN 8, GFR 53 -IVF: will give 2L NS bolus, then 125 cc/h  Leukocytosis: WBS 13.8. no fever.  Possibly due to stress. -Patient is on Ancef as above for prophylaxis -Follow-up blood culture   Inpatient status:  # Patient requires inpatient status due to high intensity of service, high risk for further deterioration and high frequency of surveillance required.  I certify that at the point of admission it is my clinical judgment that the patient will require inpatient hospital care spanning beyond 2 midnights from the point of admission.  . This patient has multiple chronic comorbidities including hypertension and multiple psych issues . Now patient has presenting with overdose, suicidal attempt, multiple skin lacerations in both forearms . The worrisome physical exam findings include altered mental status, multiple skin lacerations in both forearms . The initial radiographic and laboratory data are worrisome because of elevated Tylenol level. . Current medical  needs: please see my assessment and plan . Predictability of an adverse outcome (risk): Patient has multiple psych issues, presents with suicidal attempt, overdsoe, altered mental status, multiple skin lacerations in both forearms. She is in critical condition, and is at high risk for deteriorating.  Pt will need to be treated in hospital for at least 2 days.    DVT ppx: SCD Code Status: Full code Family Communication:   Yes, patient's sister   at bed side Disposition Plan:  Anticipate discharge back to previous home environment Consults called:  Hydrographic surveyor, Dr. Merlyn Lot was consulted Admission status:  SDU/inpation       Date of Service 02/16/2019    Lorretta Harp [facility]   If  7PM-7AM, please contact night-coverage [email or web address] Password [email or web address] 02/16/2019, 12:22 AM

## 2019-02-16 DIAGNOSIS — X789XXA Intentional self-harm by unspecified sharp object, initial encounter: Secondary | ICD-10-CM

## 2019-02-16 DIAGNOSIS — S56921A Laceration of unspecified muscles, fascia and tendons at forearm level, right arm, initial encounter: Secondary | ICD-10-CM

## 2019-02-16 DIAGNOSIS — G929 Unspecified toxic encephalopathy: Secondary | ICD-10-CM | POA: Diagnosis present

## 2019-02-16 DIAGNOSIS — S51811A Laceration without foreign body of right forearm, initial encounter: Secondary | ICD-10-CM

## 2019-02-16 DIAGNOSIS — G92 Toxic encephalopathy: Secondary | ICD-10-CM | POA: Diagnosis present

## 2019-02-16 DIAGNOSIS — T1491XA Suicide attempt, initial encounter: Secondary | ICD-10-CM

## 2019-02-16 DIAGNOSIS — T424X2A Poisoning by benzodiazepines, intentional self-harm, initial encounter: Principal | ICD-10-CM

## 2019-02-16 LAB — CBC
HCT: 31.3 % — ABNORMAL LOW (ref 36.0–46.0)
Hemoglobin: 9.9 g/dL — ABNORMAL LOW (ref 12.0–15.0)
MCH: 28.9 pg (ref 26.0–34.0)
MCHC: 31.6 g/dL (ref 30.0–36.0)
MCV: 91.5 fL (ref 80.0–100.0)
Platelets: 263 10*3/uL (ref 150–400)
RBC: 3.42 MIL/uL — ABNORMAL LOW (ref 3.87–5.11)
RDW: 13.7 % (ref 11.5–15.5)
WBC: 7.1 10*3/uL (ref 4.0–10.5)
nRBC: 0 % (ref 0.0–0.2)

## 2019-02-16 LAB — COMPREHENSIVE METABOLIC PANEL
ALT: 20 U/L (ref 0–44)
AST: 20 U/L (ref 15–41)
Albumin: 2.7 g/dL — ABNORMAL LOW (ref 3.5–5.0)
Alkaline Phosphatase: 54 U/L (ref 38–126)
Anion gap: 8 (ref 5–15)
BUN: 5 mg/dL — ABNORMAL LOW (ref 6–20)
CO2: 20 mmol/L — ABNORMAL LOW (ref 22–32)
Calcium: 7.7 mg/dL — ABNORMAL LOW (ref 8.9–10.3)
Chloride: 112 mmol/L — ABNORMAL HIGH (ref 98–111)
Creatinine, Ser: 0.98 mg/dL (ref 0.44–1.00)
GFR calc Af Amer: >60 mL/min (ref >60)
GFR calc non Af Amer: >60 mL/min (ref >60)
GLUCOSE: 79 mg/dL (ref 70–99)
Potassium: 3.7 mmol/L (ref 3.5–5.1)
Sodium: 140 mmol/L (ref 135–145)
TOTAL PROTEIN: 5.3 g/dL — AB (ref 6.5–8.1)
Total Bilirubin: 0.3 mg/dL (ref 0.3–1.2)

## 2019-02-16 LAB — ACETAMINOPHEN LEVEL
Acetaminophen (Tylenol), Serum: 60 ug/mL — ABNORMAL HIGH (ref 10–30)
Acetaminophen (Tylenol), Serum: 27 ug/mL (ref 10–30)

## 2019-02-16 LAB — MRSA PCR SCREENING: MRSA by PCR: NEGATIVE

## 2019-02-16 LAB — HIV ANTIBODY (ROUTINE TESTING W REFLEX): HIV SCREEN 4TH GENERATION: NONREACTIVE

## 2019-02-16 MED ORDER — ENOXAPARIN SODIUM 40 MG/0.4ML ~~LOC~~ SOLN
40.0000 mg | SUBCUTANEOUS | Status: DC
  Administered 2019-02-16 – 2019-02-17 (×2): 40 mg via SUBCUTANEOUS
  Filled 2019-02-16 (×2): qty 0.4

## 2019-02-16 MED ORDER — SODIUM CHLORIDE 0.9 % IV SOLN
INTRAVENOUS | Status: DC
  Administered 2019-02-16 – 2019-02-17 (×2): via INTRAVENOUS

## 2019-02-16 MED ORDER — ORAL CARE MOUTH RINSE
15.0000 mL | Freq: Two times a day (BID) | OROMUCOSAL | Status: DC
  Administered 2019-02-16 – 2019-02-17 (×3): 15 mL via OROMUCOSAL

## 2019-02-16 NOTE — Plan of Care (Signed)

## 2019-02-16 NOTE — Progress Notes (Signed)
PROGRESS NOTE        PATIENT DETAILS Name: Shelby Gallegos Age: 38 y.o. Sex: female Date of Birth: 10-24-1981 Admit Date: 02/15/2019 Admitting Physician Lorretta Harp, MD [id number], Leonette Monarch., MD  Brief Narrative: Patient is a 38 y.o. female with history of depression, bipolar disorder-presenting with toxic encephalopathy and multiple lacerations in her bilateral forearms in the setting of drug overdose/suicidal attempt.  Subjective: Opens eyes with strong verbal stimuli-gentle sternal rub.  Tells me her name-still groggy.  Assessment/Plan: Acute toxic encephalopathy in the setting of drug overdose/suicidal attempt: Slowly improving-from what was described in the H&P-more responsive today.  Continue supportive care-suspect has these medications excreted from her body-her encephalopathy will improve.  Drug overdose/suicidal attempt: Per H&P-took 65 tablets of Xanax (5 mg) and approximately 2 bottles of NyQuil (to 30 mL).  RN contacted poison control-recommendations are for supportive care-Tylenol levels only minimally elevated and per poison control does not require any further therapy at this point.  Once she is more awake-we will consult psychiatry officially.  Multiple lacerations in the bilateral forearms: Spoke with Dr. Merlyn Lot -he will evaluate later today.  Continue IV Ancef.  Tdap given in the ED yesterday. AKI: Resolved-likely hemodynamically mediated.  Follow electrolytes periodically  Bipolar disorder/depression/anxiety: Per patient's sister-she has been struggling with depression and bipolar disorder all her life-she has never had a prior suicidal attempt, although she has had prior suicidal thoughts in the past.  Topamax, Lamictal, Adderall, Xanax all remain on hold  DVT Prophylaxis: Prophylactic Lovenox  Code Status: Full code  Family Communication: Sister at bedside  Disposition Plan: Remain inpatient  Antimicrobial agents: Anti-infectives  (From admission, onward)   Start     Dose/Rate Route Frequency Ordered Stop   02/16/19 0600  ceFAZolin (ANCEF) IVPB 1 g/50 mL premix     1 g 100 mL/hr over 30 Minutes Intravenous Every 8 hours 02/15/19 2350     02/15/19 2315  ceFAZolin (ANCEF) IVPB 1 g/50 mL premix     1 g 100 mL/hr over 30 Minutes Intravenous  Once 02/15/19 2311 02/16/19 0151      Procedures: None  CONSULTS: Hand surgery  Time spent: 25 minutes-Greater than 50% of this time was spent in counseling, explanation of diagnosis, planning of further management, and coordination of care.  MEDICATIONS: Scheduled Meds: . mouth rinse  15 mL Mouth Rinse BID   Continuous Infusions: . sodium chloride 125 mL/hr at 02/16/19 0356  .  ceFAZolin (ANCEF) IV 1 g (02/16/19 0719)   PRN Meds:.hydrALAZINE, ondansetron **OR** ondansetron (ZOFRAN) IV   PHYSICAL EXAM: Vital signs: Vitals:   02/16/19 0304 02/16/19 0345 02/16/19 0621 02/16/19 1218  BP: (!) 97/57  (!) 94/55   Pulse: 77 78 79   Resp: 18 20 14    Temp:   97.7 F (36.5 C) 97.9 F (36.6 C)  TempSrc:   Axillary Axillary  SpO2: 94% 92% 98%   Weight:      Height:       Filed Weights   02/15/19 2315  Weight: 96.6 kg   Body mass index is 33.36 kg/m.   General appearance : Lethargic but awakes with strong verbal/mild sternal rub.  Answers questions appropriately-able to tell me her name. HEENT: Atraumatic and Normocephalic Neck: supple, no JVD.  Resp:Good air entry bilaterally, no added sounds  CVS: S1 S2 regular, no murmurs.  GI:  Bowel sounds present, Non tender and not distended with no gaurding, rigidity or rebound.No organomegaly Extremities: B/L Lower Ext shows no edema, both legs are warm to touch Musculoskeletal:No digital cyanosis Skin:No Rash, warm and dry  I have personally reviewed following labs and imaging studies  LABORATORY DATA: CBC: Recent Labs  Lab 02/15/19 1920 02/16/19 0428  WBC 13.8* 7.1  NEUTROABS 11.2*  --   HGB 11.5* 9.9*   HCT 37.2 31.3*  MCV 90.7 91.5  PLT 339 263    Basic Metabolic Panel: Recent Labs  Lab 02/15/19 1948 02/16/19 0428  NA 135 140  K 3.5 3.7  CL 106 112*  CO2 19* 20*  GLUCOSE 208* 79  BUN 8 5*  CREATININE 1.29* 0.98  CALCIUM 8.7* 7.7*    GFR: Estimated Creatinine Clearance: 93.8 mL/min (by C-G formula based on SCr of 0.98 mg/dL).  Liver Function Tests: Recent Labs  Lab 02/15/19 1948 02/16/19 0428  AST 22 20  ALT 19 20  ALKPHOS 73 54  BILITOT 0.4 0.3  PROT 6.2* 5.3*  ALBUMIN 3.3* 2.7*   No results for input(s): LIPASE, AMYLASE in the last 168 hours. No results for input(s): AMMONIA in the last 168 hours.  Coagulation Profile: Recent Labs  Lab 02/15/19 1948  INR 1.1    Cardiac Enzymes: No results for input(s): CKTOTAL, CKMB, CKMBINDEX, TROPONINI in the last 168 hours.  BNP (last 3 results) No results for input(s): PROBNP in the last 8760 hours.  HbA1C: No results for input(s): HGBA1C in the last 72 hours.  CBG: No results for input(s): GLUCAP in the last 168 hours.  Lipid Profile: No results for input(s): CHOL, HDL, LDLCALC, TRIG, CHOLHDL, LDLDIRECT in the last 72 hours.  Thyroid Function Tests: No results for input(s): TSH, T4TOTAL, FREET4, T3FREE, THYROIDAB in the last 72 hours.  Anemia Panel: No results for input(s): VITAMINB12, FOLATE, FERRITIN, TIBC, IRON, RETICCTPCT in the last 72 hours.  Urine analysis:    Component Value Date/Time   COLORURINE YELLOW (A) 12/15/2015 0910   APPEARANCEUR CLEAR (A) 12/15/2015 0910   LABSPEC 1.013 12/15/2015 0910   PHURINE 7.0 12/15/2015 0910   GLUCOSEU NEGATIVE 12/15/2015 0910   HGBUR 2+ (A) 12/15/2015 0910   BILIRUBINUR NEGATIVE 12/15/2015 0910   KETONESUR NEGATIVE 12/15/2015 0910   PROTEINUR NEGATIVE 12/15/2015 0910   NITRITE NEGATIVE 12/15/2015 0910   LEUKOCYTESUR TRACE (A) 12/15/2015 0910    Sepsis Labs: Lactic Acid, Venous No results found for: LATICACIDVEN  MICROBIOLOGY: Recent Results (from  the past 240 hour(s))  MRSA PCR Screening     Status: None   Collection Time: 02/16/19  3:49 AM  Result Value Ref Range Status   MRSA by PCR NEGATIVE NEGATIVE Final    Comment:        The GeneXpert MRSA Assay (FDA approved for NASAL specimens only), is one component of a comprehensive MRSA colonization surveillance program. It is not intended to diagnose MRSA infection nor to guide or monitor treatment for MRSA infections. Performed at [facility], [address], [place], [place] [address]     RADIOLOGY STUDIES/RESULTS: No results found.   LOS: 1 day   Jeoffrey Massed, MD  [facility]  If 7PM-7AM, please contact night-coverage  Please page via [email or web address]  Go to [email or web address] and use [place]'s universal password to access. If you do not have the password, please contact the hospital operator.  Locate the [facility] provider you are looking for under [facility] and page to a number  that you can be directly reached. If you still have difficulty reaching the provider, please page the [facility] (Director on Call) for the Hospitalists listed on amion for assistance.  02/16/2019, 12:40 PM

## 2019-02-16 NOTE — Progress Notes (Signed)
Poison Control called. No additional tylenol levels needed as 60 is sub-toxic. Patient remains lethargic, but responds to painful stimuli.

## 2019-02-16 NOTE — Consult Note (Signed)
Shelby Gallegos is an 38 y.o. female.   Chief Complaint: bilateral forearm lacerations HPI: 38 yo female states she cut both forearms with box cutter yesterday.  Seen in ED where wounds cleaned, explored, and stapled.  She report no previous injury to the forearms and no other injury at this time.  She describes a sore type pain alleviated with rest and aggravated with motion.  There was associated bleeding from the wounds.  Case discussed with Loren Racer, MD and his note from 02/16/2019 reviewed. Xrays viewed and interpreted by me: none Labs reviewed: WBC 13.8  Allergies: No Known Allergies  Past Medical History:  Diagnosis Date  . ADHD (attention deficit hyperactivity disorder)   . Bipolar 1 disorder (HCC)   . Bipolar 1 disorder (HCC)   . Migraine   . Migraine     Past Surgical History:  Procedure Laterality Date  . NO PAST SURGERIES      Family History: Family History  Problem Relation Age of Onset  . Atrial fibrillation Mother   . Bipolar disorder Paternal Uncle     Social History:   reports that she has never smoked. She has never used smokeless tobacco. She reports that she does not drink alcohol or use drugs.  Medications: Medications Prior to Admission  Medication Sig Dispense Refill  . ALPRAZolam (XANAX) 0.5 MG tablet Take 0.5 mg by mouth 2 (two) times daily as needed for anxiety.     Marland Kitchen amphetamine-dextroamphetamine (ADDERALL) 20 MG tablet Take 20 mg by mouth 3 (three) times daily.     Dorise Hiss (AIMOVIG, 140 MG DOSE,) 70 MG/ML SOAJ Inject 70 mg into the skin every 30 (thirty) days.    Marland Kitchen lamoTRIgine (LAMICTAL) 150 MG tablet Take 150 mg by mouth 2 (two) times daily.    Marland Kitchen topiramate (TOPAMAX) 100 MG tablet       Results for orders placed or performed during the hospital encounter of 02/15/19 (from the past 48 hour(s))  CBC with Differential/Platelet     Status: Abnormal   Collection Time: 02/15/19  7:20 PM  Result Value Ref Range   WBC 13.8 (H) 4.0 -  10.5 K/uL   RBC 4.10 3.87 - 5.11 MIL/uL   Hemoglobin 11.5 (L) 12.0 - 15.0 g/dL   HCT 63.0 16.0 - 10.9 %   MCV 90.7 80.0 - 100.0 fL   MCH 28.0 26.0 - 34.0 pg   MCHC 30.9 30.0 - 36.0 g/dL   RDW 32.3 55.7 - 32.2 %   Platelets 339 150 - 400 K/uL   nRBC 0.0 0.0 - 0.2 %   Neutrophils Relative % 82 %   Neutro Abs 11.2 (H) 1.7 - 7.7 K/uL   Lymphocytes Relative 11 %   Lymphs Abs 1.6 0.7 - 4.0 K/uL   Monocytes Relative 7 %   Monocytes Absolute 0.9 0.1 - 1.0 K/uL   Eosinophils Relative 0 %   Eosinophils Absolute 0.0 0.0 - 0.5 K/uL   Basophils Relative 0 %   Basophils Absolute 0.0 0.0 - 0.1 K/uL   Immature Granulocytes 0 %   Abs Immature Granulocytes 0.03 0.00 - 0.07 K/uL    Comment: Performed at [facility], [address], [place], [place] [address]  Comprehensive metabolic panel     Status: Abnormal   Collection Time: 02/15/19  7:48 PM  Result Value Ref Range   Sodium 135 135 - 145 mmol/L   Potassium 3.5 3.5 - 5.1 mmol/L   Chloride 106 98 - 111 mmol/L  CO2 19 (L) 22 - 32 mmol/L   Glucose, Bld 208 (H) 70 - 99 mg/dL   BUN 8 6 - 20 mg/dL   Creatinine, Ser 5.61 (H) 0.44 - 1.00 mg/dL   Calcium 8.7 (L) 8.9 - 10.3 mg/dL   Total Protein 6.2 (L) 6.5 - 8.1 g/dL   Albumin 3.3 (L) 3.5 - 5.0 g/dL   AST 22 15 - 41 U/L   ALT 19 0 - 44 U/L   Alkaline Phosphatase 73 38 - 126 U/L   Total Bilirubin 0.4 0.3 - 1.2 mg/dL   GFR calc non Af Amer 53 (L) >60 mL/min   GFR calc Af Amer >60 >60 mL/min   Anion gap 10 5 - 15    Comment: Performed at [facility], [address] [place], [place] [address]  Ethanol     Status: None   Collection Time: 02/15/19  7:48 PM  Result Value Ref Range   Alcohol, Ethyl (B) <10 <10 mg/dL    Comment: (NOTE) Lowest detectable limit for serum alcohol is 10 mg/dL. For medical purposes only. Performed at [facility], [address], [place], [place] [address]   Acetaminophen level     Status: Abnormal   Collection Time: 02/15/19  7:48 PM  Result  Value Ref Range   Acetaminophen (Tylenol), Serum 58 (H) 10 - 30 ug/mL    Comment: (NOTE) Therapeutic concentrations vary significantly. A range of 10-30 ug/mL  may be an effective concentration for many patients. However, some  are best treated at concentrations outside of this range. Acetaminophen concentrations >150 ug/mL at 4 hours after ingestion  and >50 ug/mL at 12 hours after ingestion are often associated with  toxic reactions. Performed at [facility], [address] [place], [place] [address]   Salicylate level     Status: None   Collection Time: 02/15/19  7:48 PM  Result Value Ref Range   Salicylate Lvl <7.0 2.8 - 30.0 mg/dL    Comment: Performed at [facility], [address], [place], [place] [address]  Protime-INR     Status: None   Collection Time: 02/15/19  7:48 PM  Result Value Ref Range   Prothrombin Time 14.3 11.4 - 15.2 seconds   INR 1.1 0.8 - 1.2    Comment: (NOTE) INR goal varies based on device and disease states. Performed at [facility], [address] [place], [place] [address]   APTT     Status: None   Collection Time: 02/15/19  7:48 PM  Result Value Ref Range   aPTT 28 24 - 36 seconds    Comment: Performed at [facility], [address] [place], [place] [address]  I-Stat Beta hCG blood, ED ([facility], [facility], [facility] only)     Status: None   Collection Time: 02/15/19  7:56 PM  Result Value Ref Range   I-stat hCG, quantitative <5.0 <5 mIU/mL   Comment 3            Comment:   GEST. AGE      CONC.  (mIU/mL)   <=1 WEEK        5 - 50     2 WEEKS       50 - 500     3 WEEKS       100 - 10,000     4 WEEKS     1,000 - 30,000        FEMALE  AND NON-PREGNANT FEMALE:     LESS THAN 5 mIU/mL   Type and screen     Status: None   Collection Time: 02/15/19  8:00 PM  Result Value Ref Range   ABO/RH(D) A POS    Antibody Screen NEG    Sample Expiration      02/18/2019 Performed at [facility], [address] [place], [place] [address]    ABO/Rh     Status: None   Collection Time: 02/15/19  8:00 PM  Result Value Ref Range   ABO/RH(D)      A POS Performed at [facility], [address] [place], [place] [address]   Acetaminophen level     Status: Abnormal   Collection Time: 02/15/19 10:51 PM  Result Value Ref Range   Acetaminophen (Tylenol), Serum 60 (H) 10 - 30 ug/mL    Comment: (NOTE) Therapeutic concentrations vary significantly. A range of 10-30 ug/mL  may be an effective concentration for many patients. However, some  are best treated at concentrations outside of this range. Acetaminophen concentrations >150 ug/mL at 4 hours after ingestion  and >50 ug/mL at 12 hours after ingestion are often associated with  toxic reactions. Performed at [facility], [address], [place], [place] [address]   HIV antibody (Routine Testing)     Status: None   Collection Time: 02/15/19 11:57 PM  Result Value Ref Range   HIV Screen 4th Generation wRfx Non Reactive Non Reactive    Comment: (NOTE) Performed At: [facility] [address] [place], [place] [id number] Jolene Schimke MD OZ:[id number]   MRSA PCR Screening     Status: None   Collection Time: 02/16/19  3:49 AM  Result Value Ref Range   MRSA by PCR NEGATIVE NEGATIVE    Comment:        The GeneXpert MRSA Assay (FDA approved for NASAL specimens only), is one component of a comprehensive MRSA colonization surveillance program. It is not intended to diagnose MRSA infection nor to guide or monitor treatment for MRSA infections. Performed at [facility], [address] [place], [place] [address]   CBC     Status: Abnormal   Collection Time: 02/16/19  4:28 AM  Result Value Ref Range   WBC 7.1 4.0 - 10.5 K/uL   RBC 3.42 (L) 3.87 - 5.11 MIL/uL   Hemoglobin 9.9 (L) 12.0 - 15.0 g/dL   HCT 84.1 (L) 32.4 - 40.1 %   MCV 91.5 80.0 - 100.0 fL   MCH 28.9 26.0 - 34.0 pg   MCHC 31.6 30.0 - 36.0 g/dL   RDW 02.7 25.3 - 66.4 %   Platelets  263 150 - 400 K/uL   nRBC 0.0 0.0 - 0.2 %    Comment: Performed at [facility], [address] [place], [place] [address]  Comprehensive metabolic panel     Status: Abnormal   Collection Time: 02/16/19  4:28 AM  Result Value Ref Range   Sodium 140 135 - 145 mmol/L   Potassium 3.7 3.5 - 5.1 mmol/L   Chloride 112 (H) 98 - 111 mmol/L   CO2 20 (L) 22 - 32 mmol/L   Glucose, Bld 79 70 - 99 mg/dL   BUN 5 (L) 6 - 20 mg/dL   Creatinine, Ser 4.25 0.44 - 1.00 mg/dL   Calcium 7.7 (L) 8.9 - 10.3 mg/dL   Total Protein 5.3 (L) 6.5 - 8.1 g/dL   Albumin 2.7 (L)  3.5 - 5.0 g/dL   AST 20 15 - 41 U/L   ALT 20 0 - 44 U/L   Alkaline Phosphatase 54 38 - 126 U/L   Total Bilirubin 0.3 0.3 - 1.2 mg/dL   GFR calc non Af Amer >60 >60 mL/min   GFR calc Af Amer >60 >60 mL/min   Anion gap 8 5 - 15    Comment: Performed at [facility], [address] [place], [place] [address]  Acetaminophen level     Status: None   Collection Time: 02/16/19  7:11 AM  Result Value Ref Range   Acetaminophen (Tylenol), Serum 27 10 - 30 ug/mL    Comment: (NOTE) Therapeutic concentrations vary significantly. A range of 10-30 ug/mL  may be an effective concentration for many patients. However, some  are best treated at concentrations outside of this range. Acetaminophen concentrations >150 ug/mL at 4 hours after ingestion  and >50 ug/mL at 12 hours after ingestion are often associated with  toxic reactions. Performed at [facility], [address] [place], [place] [address]     No results found.   A comprehensive review of systems was negative except for headaches. Review of Systems: No fevers, chills, night sweats, chest pain, shortness of breath, nausea, vomiting, diarrhea, constipation, easy bleeding or bruising, dizziness, vision changes, fainting.   Blood pressure (!) 94/55, pulse 79, temperature 97.9 F (36.6 C), temperature source Axillary, resp. rate 14, height  (1.702 m), weight 96.6  kg, SpO2 98 %.  General appearance:drowsy but cooperative and appears stated age Head: Normocephalic, without obvious abnormality, atraumatic Neck: supple, symmetrical, trachea midline Extremities: intact sensation and capillary refill all digits.  +FDP/FDS/FPL/EPL/IO.  Able to make complete fist.  States sensation in all digits is normal.  Multiple lacerations bilateral volar forearms and wrist.  Laceration to radial side left wrist. Pulses: 2+ and symmetric Skin: Skin color, texture, turgor normal. No rashes or lesions Neurologic: Grossly normal Incision/Wound: as above  Assessment/Plan Bilateral forearm lacerations.  No deficit by exam.  Per ED doctor, partial injury to one tendon noted at time of laceration repair.  May benefit from operative exploration of wounds.  This can be done on outpatient basis next week once medical issues stabilized.  [name] 02/16/2019, 3:31 PM

## 2019-02-16 NOTE — Consult Note (Signed)
WOC Nurse wound consult note Reason for Consult: Deep lacerations to bilateral arms  WOC Nursing was simultaneously consulted by ED Physician with Hand Surgeon, Dr. Karlyn Agee. Patient is awaiting evaluation by that physician.  WOC Nursing will defer to the expertise of Dr. Karlyn Agee and not see.  WOC nursing team will not follow, but will remain available to this patient, the nursing and medical teams.  Please re-consult if needed. Thanks, Ladona Mow, MSN, RN, GNP, [facility] [place]  Pager# [phone]

## 2019-02-16 NOTE — Consult Note (Signed)
[facility] Face-to-Face Psychiatry Consult   Reason for Consult:  Suicide attempt by overdose and cutting. Referring Physician:  Dr. Jeoffrey Massed Patient Identification: Shelby Gallegos MRN:  [id number] Principal Diagnosis: Suicide attempt [facility]) Diagnosis:  Principal Problem:   Suicidal behavior with attempted self-injury [facility]) Active Problems:   Bipolar I disorder (HCC)   Essential (primary) hypertension   Depression   ADHD (attention deficit hyperactivity disorder)   Borderline personality disorder (HCC)   Overdose   AKI (acute kidney injury) (HCC)   Leukocytosis   Laceration of multiple sites of skin   Toxic encephalopathy   Total Time spent with patient: 1 hour  Subjective:   Shelby Gallegos is a 38 y.o. female patient admitted with suicide attempt.  HPI:   Per chart review, patient was admitted with suicide attempt with lacerations to bilateral forearms (an estimated [phone] mL blood loss) and Xanax overdose. She took 65 of 0.5 mg tablets and 1 bottle of Nyquil. Her friend called EMS after she made suicidal threats. She was drowsy, nonverbal and unable to follow commands on admission. Her acetaminophen level was 58 on admission and is currently 27. BAL was negative. She is prescribed Adderall 20 mg TID and Xanax 0.5 mg BID PRN by Dr. Evelene Croon. She was last filled Adderall on 2/24 and Xanax on 12/10 per PMP. She appears to sparingly use Xanax per PMP history. Other home medications include Lamictal 150 mg BID and Latuda 60 mg daily.    On interview, Shelby Gallegos is drowsy and provides minimal responses to questions.  She reports that she overdosed on Sudafed.  She subsequently cut both her forearms and called her friend after she attempted suicide.  She reports that she is "overwhelmed" and reports dealing with "a lot of changes" in life.  She reports work stressors.  She is currently completing orientation in peers support.  She is currently a Theatre manager.  She denies current SI, HI or  AVH.  She reports fluctuations in her appetite.  She reports chronic poor sleep.  Her mother is at bedside with her verbal consent.  She reports a history of manic symptoms.  She reports compliance with her medications.  She denies recent medication changes.   Past Psychiatric History: Bipolar 1 disorder   Risk to Self:  Yes given recent suicide attempt.  Risk to Others:  None. Denies HI.  Prior Inpatient Therapy:  She completed [facility] in 02/2017.  Prior Outpatient Therapy:  She is followed by Dr. Evelene Croon.   Past Medical History:  Past Medical History:  Diagnosis Date  . ADHD (attention deficit hyperactivity disorder)   . Bipolar 1 disorder (HCC)   . Bipolar 1 disorder (HCC)   . Migraine   . Migraine     Past Surgical History:  Procedure Laterality Date  . NO PAST SURGERIES     Family History:  Family History  Problem Relation Age of Onset  . Atrial fibrillation Mother   . Bipolar disorder Paternal Uncle    Family Psychiatric  History: As listed above.  Social History:  Social History   Substance and Sexual Activity  Alcohol Use No     Social History   Substance and Sexual Activity  Drug Use No    Social History   Socioeconomic History  . Marital status: Single    Spouse name: Not on file  . Number of children: Not on file  . Years of education: Not on file  . Highest education level: Not on file  Occupational History  . Not on file  Social Needs  . Financial resource strain: Not on file  . Food insecurity:    Worry: Not on file    Inability: Not on file  . Transportation needs:    Medical: Not on file    Non-medical: Not on file  Tobacco Use  . Smoking status: Never Smoker  . Smokeless tobacco: Never Used  Substance and Sexual Activity  . Alcohol use: No  . Drug use: No  . Sexual activity: Not Currently  Lifestyle  . Physical activity:    Days per week: Not on file    Minutes per session: Not on file  . Stress: Not on file  Relationships  . Social  connections:    Talks on phone: Not on file    Gets together: Not on file    Attends religious service: Not on file    Active member of club or organization: Not on file    Attends meetings of clubs or organizations: Not on file    Relationship status: Not on file  Other Topics Concern  . Not on file  Social History Narrative  . Not on file   Additional Social History: She lives at home alone. She is single. She does not have children. She is a Theatre manager. She is currently doing orientation for peer support. She denies alcohol or illicit substance use.     Allergies:  No Known Allergies  Labs:  Results for orders placed or performed during the hospital encounter of 02/15/19 (from the past 48 hour(s))  CBC with Differential/Platelet     Status: Abnormal   Collection Time: 02/15/19  7:20 PM  Result Value Ref Range   WBC 13.8 (H) 4.0 - 10.5 K/uL   RBC 4.10 3.87 - 5.11 MIL/uL   Hemoglobin 11.5 (L) 12.0 - 15.0 g/dL   HCT 16.1 09.6 - 04.5 %   MCV 90.7 80.0 - 100.0 fL   MCH 28.0 26.0 - 34.0 pg   MCHC 30.9 30.0 - 36.0 g/dL   RDW 40.9 81.1 - 91.4 %   Platelets 339 150 - 400 K/uL   nRBC 0.0 0.0 - 0.2 %   Neutrophils Relative % 82 %   Neutro Abs 11.2 (H) 1.7 - 7.7 K/uL   Lymphocytes Relative 11 %   Lymphs Abs 1.6 0.7 - 4.0 K/uL   Monocytes Relative 7 %   Monocytes Absolute 0.9 0.1 - 1.0 K/uL   Eosinophils Relative 0 %   Eosinophils Absolute 0.0 0.0 - 0.5 K/uL   Basophils Relative 0 %   Basophils Absolute 0.0 0.0 - 0.1 K/uL   Immature Granulocytes 0 %   Abs Immature Granulocytes 0.03 0.00 - 0.07 K/uL    Comment: Performed at [facility], [address], [place], [place] [address]  Comprehensive metabolic panel     Status: Abnormal   Collection Time: 02/15/19  7:48 PM  Result Value Ref Range   Sodium 135 135 - 145 mmol/L   Potassium 3.5 3.5 - 5.1 mmol/L   Chloride 106 98 - 111 mmol/L   CO2 19 (L) 22 - 32 mmol/L   Glucose, Bld 208 (H) 70 - 99 mg/dL   BUN 8 6  - 20 mg/dL   Creatinine, Ser 6.21 (H) 0.44 - 1.00 mg/dL   Calcium 8.7 (L) 8.9 - 10.3 mg/dL   Total Protein 6.2 (L) 6.5 - 8.1 g/dL   Albumin 3.3 (L) 3.5 - 5.0 g/dL  AST 22 15 - 41 U/L   ALT 19 0 - 44 U/L   Alkaline Phosphatase 73 38 - 126 U/L   Total Bilirubin 0.4 0.3 - 1.2 mg/dL   GFR calc non Af Amer 53 (L) >60 mL/min   GFR calc Af Amer >60 >60 mL/min   Anion gap 10 5 - 15    Comment: Performed at [facility], [address] [place], [place] [address]  Ethanol     Status: None   Collection Time: 02/15/19  7:48 PM  Result Value Ref Range   Alcohol, Ethyl (B) <10 <10 mg/dL    Comment: (NOTE) Lowest detectable limit for serum alcohol is 10 mg/dL. For medical purposes only. Performed at [facility], [address], [place], [place] [address]   Acetaminophen level     Status: Abnormal   Collection Time: 02/15/19  7:48 PM  Result Value Ref Range   Acetaminophen (Tylenol), Serum 58 (H) 10 - 30 ug/mL    Comment: (NOTE) Therapeutic concentrations vary significantly. A range of 10-30 ug/mL  may be an effective concentration for many patients. However, some  are best treated at concentrations outside of this range. Acetaminophen concentrations >150 ug/mL at 4 hours after ingestion  and >50 ug/mL at 12 hours after ingestion are often associated with  toxic reactions. Performed at [facility], [address], [place], [place] [address]   Salicylate level     Status: None   Collection Time: 02/15/19  7:48 PM  Result Value Ref Range   Salicylate Lvl <7.0 2.8 - 30.0 mg/dL    Comment: Performed at [facility], [address] [place], [place] [address]  Protime-INR     Status: None   Collection Time: 02/15/19  7:48 PM  Result Value Ref Range   Prothrombin Time 14.3 11.4 - 15.2 seconds   INR 1.1 0.8 - 1.2    Comment: (NOTE) INR goal varies based on device and disease states. Performed at [facility], [address], [place], [place] [address]    APTT     Status: None   Collection Time: 02/15/19  7:48 PM  Result Value Ref Range   aPTT 28 24 - 36 seconds    Comment: Performed at [facility], [address], [place], [place] [address]  I-Stat Beta hCG blood, ED ([facility], [facility], [facility] only)     Status: None   Collection Time: 02/15/19  7:56 PM  Result Value Ref Range   I-stat hCG, quantitative <5.0 <5 mIU/mL   Comment 3            Comment:   GEST. AGE      CONC.  (mIU/mL)   <=1 WEEK        5 - 50     2 WEEKS       50 - 500     3 WEEKS       100 - 10,000     4 WEEKS     1,000 - 30,000        FEMALE AND NON-PREGNANT FEMALE:     LESS THAN 5 mIU/mL   Type and screen     Status: None   Collection Time: 02/15/19  8:00 PM  Result Value Ref Range   ABO/RH(D) A POS    Antibody Screen NEG    Sample Expiration      02/18/2019 Performed at [facility], [address] [place], [place] [address]   ABO/Rh     Status: None   Collection Time: 02/15/19  8:00 PM  Result Value Ref Range   ABO/RH(D)      A POS Performed at [facility], [address] [place], [place] [address]   Acetaminophen level     Status: Abnormal   Collection Time: 02/15/19 10:51 PM  Result Value Ref Range   Acetaminophen (Tylenol), Serum 60 (H) 10 - 30 ug/mL    Comment: (NOTE) Therapeutic concentrations vary significantly. A range of 10-30 ug/mL  may be an effective concentration for many patients. However, some  are best treated at concentrations outside of this range. Acetaminophen concentrations >150 ug/mL at 4 hours after ingestion  and >50 ug/mL at 12 hours after ingestion are often associated with  toxic reactions. Performed at [facility], [address] [place], [place] [address]   MRSA PCR Screening     Status: None   Collection Time: 02/16/19  3:49 AM  Result Value Ref Range   MRSA by PCR NEGATIVE NEGATIVE    Comment:        The GeneXpert MRSA Assay (FDA approved for NASAL specimens only), is one component of  a comprehensive MRSA colonization surveillance program. It is not intended to diagnose MRSA infection nor to guide or monitor treatment for MRSA infections. Performed at [facility], [address] [place], [place] [address]   CBC     Status: Abnormal   Collection Time: 02/16/19  4:28 AM  Result Value Ref Range   WBC 7.1 4.0 - 10.5 K/uL   RBC 3.42 (L) 3.87 - 5.11 MIL/uL   Hemoglobin 9.9 (L) 12.0 - 15.0 g/dL   HCT 29.531.3 (L) 62.136.0 - 30.846.0 %   MCV 91.5 80.0 - 100.0 fL   MCH 28.9 26.0 - 34.0 pg   MCHC 31.6 30.0 - 36.0 g/dL   RDW 65.713.7 84.611.5 - 96.215.5 %   Platelets 263 150 - 400 K/uL   nRBC 0.0 0.0 - 0.2 %    Comment: Performed at [facility], [address] [place], [place] [address]  Comprehensive metabolic panel     Status: Abnormal   Collection Time: 02/16/19  4:28 AM  Result Value Ref Range   Sodium 140 135 - 145 mmol/L   Potassium 3.7 3.5 - 5.1 mmol/L   Chloride 112 (H) 98 - 111 mmol/L   CO2 20 (L) 22 - 32 mmol/L   Glucose, Bld 79 70 - 99 mg/dL   BUN 5 (L) 6 - 20 mg/dL   Creatinine, Ser 1.320.98 0.44 - 1.00 mg/dL   Calcium 7.7 (L) 8.9 - 10.3 mg/dL   Total Protein 5.3 (L) 6.5 - 8.1 g/dL   Albumin 2.7 (L) 3.5 - 5.0 g/dL   AST 20 15 - 41 U/L   ALT 20 0 - 44 U/L   Alkaline Phosphatase 54 38 - 126 U/L   Total Bilirubin 0.3 0.3 - 1.2 mg/dL   GFR calc non Af Amer >60 >60 mL/min   GFR calc Af Amer >60 >60 mL/min   Anion gap 8 5 - 15    Comment: Performed at [facility], [address] [place], [place] [address]  Acetaminophen level     Status: None   Collection Time: 02/16/19  7:11 AM  Result Value Ref Range   Acetaminophen (Tylenol), Serum 27 10 - 30 ug/mL    Comment: (NOTE) Therapeutic concentrations  vary significantly. A range of 10-30 ug/mL  may be an effective concentration for many patients. However, some  are best treated at concentrations outside of this range. Acetaminophen concentrations >150 ug/mL at 4 hours after ingestion  and >50 ug/mL at 12  hours after ingestion are often associated with  toxic reactions. Performed at [facility], [address] [place], [place] [address]     Current Facility-Administered Medications  Medication Dose Route Frequency Provider Last Rate Last Dose  . 0.9 %  sodium chloride infusion   Intravenous Continuous Lorretta Harp, MD 125 mL/hr at 02/16/19 0356    . ceFAZolin (ANCEF) IVPB 1 g/50 mL premix  1 g Intravenous Q8H Juliette Mangle, RPH 100 mL/hr at 02/16/19 0719 1 g at 02/16/19 0719  . hydrALAZINE (APRESOLINE) injection 5 mg  5 mg Intravenous Q2H PRN Lorretta Harp, MD      . MEDLINE mouth rinse  15 mL Mouth Rinse BID Lorretta Harp, MD   15 mL at 02/16/19 0238  . ondansetron (ZOFRAN) tablet 4 mg  4 mg Oral Q6H PRN Lorretta Harp, MD       Or  . ondansetron (ZOFRAN) injection 4 mg  4 mg Intravenous Q6H PRN Lorretta Harp, MD        Musculoskeletal: Strength & Muscle Tone: within normal limits Gait & Station: UTA since patient is lying in bed. Patient leans: N/A  Psychiatric Specialty Exam: Physical Exam  Nursing note and vitals reviewed. Constitutional: She is oriented to person, place, and time. She appears well-developed and well-nourished.  HENT:  Head: Normocephalic and atraumatic.  Neck: Normal range of motion.  Respiratory: Effort normal.  Musculoskeletal: Normal range of motion.  Neurological: She is alert and oriented to person, place, and time.  Psychiatric: Thought content normal. Her speech is slurred. She is slowed. Cognition and memory are normal. She expresses impulsivity. She exhibits a depressed mood.    Review of Systems  Cardiovascular: Negative for chest pain.  Gastrointestinal: Negative for abdominal pain, constipation, diarrhea, nausea and vomiting.  Psychiatric/Behavioral: Positive for depression. Negative for hallucinations, substance abuse and suicidal ideas. The patient has insomnia.   All other systems reviewed and are negative.   Blood pressure (!) 94/55, pulse 79,  temperature 97.7 F (36.5 C), temperature source Axillary, resp. rate 14, height 5\' 7"  (1.702 m), weight 96.6 kg, SpO2 98 %.Body mass index is 33.36 kg/m.  General Appearance: Fairly Groomed, young, obese, Caucasian female, wearing a hospital gown with black hair who is lying in bed. NAD.   Eye Contact:  Poor  Speech:  Slow and Slurred  Volume:  Decreased  Mood:  Depressed  Affect:  Constricted  Thought Process:  Linear and Descriptions of Associations: Intact  Orientation:  Full (Time, Place, and Person)  Thought Content:  Logical  Suicidal Thoughts:  No  Homicidal Thoughts:  No  Memory:  Immediate;   Good Recent;   Good Remote;   Good  Judgement:  Fair  Insight:  Fair  Psychomotor Activity:  Decreased  Concentration:  Concentration: Fair and Attention Span: Fair  Recall:  [employer] of Knowledge:  Fair  Language:  Fair  Akathisia:  No  Handed:  Right  AIMS (if indicated):   N/A  Assets:  Financial Resources/Insurance Housing Physical Health Resilience Social Support  ADL's:  Intact  Cognition:  WNL  Sleep:   Poor   Assessment:  Shelby Gallegos is a 38 y.o. female who was admitted with suicide attempt with  lacerations to bilateral forearms (an estimated [phone] mL blood loss) and Xanax overdose. She reports depression in the setting of multiple psychosocial stressors. She warrants inpatient psychiatric hospitalization for stabilization and treatment.   Treatment Plan Summary: -Patient warrants inpatient psychiatric hospitalization given high risk of harm to self. -Continue bedside sitter.  -Restart home medications once patient is medically stable and more alert.  -EKG reviewed and QTc 472. Please closely monitor when starting or increasing QTc prolonging agents. -Please pursue involuntary commitment if patient refuses voluntary psychiatric hospitalization or attempts to leave the hospital.  -Will sign off on patient at this time. Please consult psychiatry again as needed.     Disposition: Recommend psychiatric Inpatient admission when medically cleared.  Cherly Beach, DO 02/16/2019 9:52 AM

## 2019-02-16 NOTE — Progress Notes (Signed)
Pt arrived to [address]. Identified by ID bend. Pt non verbal at this time, unable to fallow commands, responsive to painful stimuli, withdraw from pain, but does not wake up. Per ED RN this is unchanged from previous condition. Suicide sitter at the bedside, no family present at this time. Pt placed on PC monitor, CCMD notified, VS stable.  Pt has ACE wraps to bilateral FA. Part of one staple visible on left FA. Dressing clean dry and intact, no bleeding, radial pulses palpated and cap refill less than 3 sec, skin dray and warm to touch.  Pt room secured, no hazardous materials present. Pt belongings undergarments and necklace placed in pt belongings beg, labeled and left in dirty utility room.  Unable to provide any teaching and complete pt Hx at this time due to pt mental status.  Will continue to monitor pt closely and treat per MD orders.

## 2019-02-16 NOTE — Progress Notes (Signed)
Patient states if she cannot go to [facility] she would like to go to [facility] as she had a positive experience there in the past.

## 2019-02-16 NOTE — Progress Notes (Signed)
Pt more responsive comparing to previous assessment. Still does not fallow commands, but it easier to arouse pt.

## 2019-02-17 ENCOUNTER — Other Ambulatory Visit: Payer: Self-pay | Admitting: Family

## 2019-02-17 ENCOUNTER — Inpatient Hospital Stay (HOSPITAL_COMMUNITY)
Admission: AD | Admit: 2019-02-17 | Discharge: 2019-02-20 | DRG: 876 | Disposition: A | Discharge status: Other Acute Inpatient Hospital | Payer: BLUE CROSS/BLUE SHIELD | Source: Intra-hospital | Attending: Psychiatry | Admitting: Psychiatry

## 2019-02-17 DIAGNOSIS — F4310 Post-traumatic stress disorder, unspecified: Secondary | ICD-10-CM | POA: Diagnosis present

## 2019-02-17 DIAGNOSIS — G4700 Insomnia, unspecified: Secondary | ICD-10-CM | POA: Diagnosis present

## 2019-02-17 DIAGNOSIS — F603 Borderline personality disorder: Secondary | ICD-10-CM | POA: Diagnosis not present

## 2019-02-17 DIAGNOSIS — Z79899 Other long term (current) drug therapy: Secondary | ICD-10-CM | POA: Diagnosis not present

## 2019-02-17 DIAGNOSIS — F419 Anxiety disorder, unspecified: Secondary | ICD-10-CM | POA: Diagnosis not present

## 2019-02-17 DIAGNOSIS — F410 Panic disorder [episodic paroxysmal anxiety] without agoraphobia: Secondary | ICD-10-CM | POA: Diagnosis present

## 2019-02-17 DIAGNOSIS — F908 Attention-deficit hyperactivity disorder, other type: Secondary | ICD-10-CM

## 2019-02-17 DIAGNOSIS — S56921A Laceration of unspecified muscles, fascia and tendons at forearm level, right arm, initial encounter: Secondary | ICD-10-CM | POA: Diagnosis present

## 2019-02-17 DIAGNOSIS — Z79891 Long term (current) use of opiate analgesic: Secondary | ICD-10-CM | POA: Diagnosis not present

## 2019-02-17 DIAGNOSIS — F319 Bipolar disorder, unspecified: Principal | ICD-10-CM | POA: Diagnosis present

## 2019-02-17 DIAGNOSIS — G43909 Migraine, unspecified, not intractable, without status migrainosus: Secondary | ICD-10-CM | POA: Diagnosis present

## 2019-02-17 DIAGNOSIS — S51812A Laceration without foreign body of left forearm, initial encounter: Secondary | ICD-10-CM | POA: Diagnosis not present

## 2019-02-17 DIAGNOSIS — T424X2A Poisoning by benzodiazepines, intentional self-harm, initial encounter: Secondary | ICD-10-CM | POA: Diagnosis present

## 2019-02-17 DIAGNOSIS — Y929 Unspecified place or not applicable: Secondary | ICD-10-CM | POA: Diagnosis not present

## 2019-02-17 DIAGNOSIS — F909 Attention-deficit hyperactivity disorder, unspecified type: Secondary | ICD-10-CM | POA: Diagnosis not present

## 2019-02-17 DIAGNOSIS — R450 Nervousness: Secondary | ICD-10-CM | POA: Diagnosis not present

## 2019-02-17 DIAGNOSIS — Z818 Family history of other mental and behavioral disorders: Secondary | ICD-10-CM | POA: Diagnosis not present

## 2019-02-17 DIAGNOSIS — T1491XA Suicide attempt, initial encounter: Secondary | ICD-10-CM | POA: Diagnosis not present

## 2019-02-17 DIAGNOSIS — X789XXA Intentional self-harm by unspecified sharp object, initial encounter: Secondary | ICD-10-CM | POA: Diagnosis present

## 2019-02-17 DIAGNOSIS — E1140 Type 2 diabetes mellitus with diabetic neuropathy, unspecified: Secondary | ICD-10-CM | POA: Diagnosis not present

## 2019-02-17 DIAGNOSIS — S51811A Laceration without foreign body of right forearm, initial encounter: Secondary | ICD-10-CM | POA: Diagnosis not present

## 2019-02-17 DIAGNOSIS — S66922A Laceration of unspecified muscle, fascia and tendon at wrist and hand level, left hand, initial encounter: Secondary | ICD-10-CM | POA: Diagnosis present

## 2019-02-17 DIAGNOSIS — Z915 Personal history of self-harm: Secondary | ICD-10-CM | POA: Diagnosis not present

## 2019-02-17 DIAGNOSIS — F314 Bipolar disorder, current episode depressed, severe, without psychotic features: Secondary | ICD-10-CM | POA: Diagnosis not present

## 2019-02-17 LAB — GLUCOSE, CAPILLARY: Glucose-Capillary: 84 mg/dL (ref 70–99)

## 2019-02-17 LAB — CBC
HCT: 33.8 % — ABNORMAL LOW (ref 36.0–46.0)
Hemoglobin: 10.3 g/dL — ABNORMAL LOW (ref 12.0–15.0)
MCH: 28 pg (ref 26.0–34.0)
MCHC: 30.5 g/dL (ref 30.0–36.0)
MCV: 91.8 fL (ref 80.0–100.0)
NRBC: 0 % (ref 0.0–0.2)
PLATELETS: 259 10*3/uL (ref 150–400)
RBC: 3.68 MIL/uL — ABNORMAL LOW (ref 3.87–5.11)
RDW: 13.7 % (ref 11.5–15.5)
WBC: 7 10*3/uL (ref 4.0–10.5)

## 2019-02-17 LAB — BASIC METABOLIC PANEL
ANION GAP: 7 (ref 5–15)
BUN: 7 mg/dL (ref 6–20)
CO2: 22 mmol/L (ref 22–32)
Calcium: 8.2 mg/dL — ABNORMAL LOW (ref 8.9–10.3)
Chloride: 109 mmol/L (ref 98–111)
Creatinine, Ser: 0.96 mg/dL (ref 0.44–1.00)
GFR calc Af Amer: >60 mL/min (ref >60)
Glucose, Bld: 93 mg/dL (ref 70–99)
Potassium: 3.5 mmol/L (ref 3.5–5.1)
Sodium: 138 mmol/L (ref 135–145)

## 2019-02-17 MED ORDER — OXYCODONE HCL 5 MG PO TABS
5.0000 mg | ORAL_TABLET | Freq: Once | ORAL | Status: AC
  Administered 2019-02-17: 5 mg via ORAL
  Filled 2019-02-17: qty 1

## 2019-02-17 MED ORDER — PRAZOSIN HCL 2 MG PO CAPS
2.0000 mg | ORAL_CAPSULE | Freq: Every day | ORAL | Status: DC
  Administered 2019-02-17 – 2019-02-19 (×3): 2 mg via ORAL
  Filled 2019-02-17 (×2): qty 1
  Filled 2019-02-17: qty 2
  Filled 2019-02-17 (×2): qty 1

## 2019-02-17 MED ORDER — TRAZODONE HCL 50 MG PO TABS
50.0000 mg | ORAL_TABLET | Freq: Every evening | ORAL | Status: DC | PRN
  Administered 2019-02-18: 50 mg via ORAL
  Filled 2019-02-17: qty 1

## 2019-02-17 MED ORDER — PRAZOSIN HCL 2 MG PO CAPS: 2.0000 mg | ORAL_CAPSULE | Freq: Every day | ORAL | Status: DC

## 2019-02-17 MED ORDER — TOPIRAMATE 100 MG PO TABS
100.0000 mg | ORAL_TABLET | Freq: Every day | ORAL | Status: DC
  Administered 2019-02-18 – 2019-02-20 (×3): 100 mg via ORAL
  Filled 2019-02-17 (×4): qty 1

## 2019-02-17 MED ORDER — LAMOTRIGINE 150 MG PO TABS
150.0000 mg | ORAL_TABLET | Freq: Two times a day (BID) | ORAL | Status: DC
  Administered 2019-02-17 – 2019-02-20 (×6): 150 mg via ORAL
  Filled 2019-02-17 (×9): qty 1

## 2019-02-17 MED ORDER — LAMOTRIGINE 25 MG PO TABS
150.0000 mg | ORAL_TABLET | Freq: Two times a day (BID) | ORAL | Status: DC
  Administered 2019-02-17: 150 mg via ORAL
  Filled 2019-02-17: qty 2

## 2019-02-17 MED ORDER — ALPRAZOLAM 0.5 MG PO TABS: 0.5000 mg | ORAL_TABLET | Freq: Two times a day (BID) | ORAL | Status: DC | PRN

## 2019-02-17 MED ORDER — SUMATRIPTAN SUCCINATE 100 MG PO TABS: 50.0000 mg | ORAL_TABLET | ORAL | Status: DC | PRN

## 2019-02-17 MED ORDER — ONDANSETRON 4 MG PO TBDP: 4.0000 mg | ORAL_TABLET | Freq: Three times a day (TID) | ORAL | Status: DC | PRN

## 2019-02-17 MED ORDER — AMPHETAMINE-DEXTROAMPHETAMINE 10 MG PO TABS
20.0000 mg | ORAL_TABLET | Freq: Three times a day (TID) | ORAL | Status: DC
  Administered 2019-02-17: 20 mg via ORAL
  Filled 2019-02-17: qty 2

## 2019-02-17 MED ORDER — ACETAMINOPHEN 325 MG PO TABS
650.0000 mg | ORAL_TABLET | Freq: Four times a day (QID) | ORAL | Status: DC | PRN
  Administered 2019-02-18 – 2019-02-20 (×6): 650 mg via ORAL
  Filled 2019-02-17 (×6): qty 2

## 2019-02-17 MED ORDER — ALUM & MAG HYDROXIDE-SIMETH 200-200-20 MG/5ML PO SUSP: 30.0000 mL | ORAL | Status: DC | PRN

## 2019-02-17 MED ORDER — MAGNESIUM HYDROXIDE 400 MG/5ML PO SUSP: 30.0000 mL | Freq: Every day | ORAL | Status: DC | PRN

## 2019-02-17 NOTE — Progress Notes (Signed)
Report called to [facility] 

## 2019-02-17 NOTE — Discharge Summary (Signed)
PATIENT DETAILS Name: Shelby Gallegos Age: 38 y.o. Sex: female Date of Birth: [birth year] MRN: [id number]. Admitting Physician: Lorretta Harp, MD [id number], Leonette Monarch., MD  Admit Date: 02/15/2019 Discharge date: 02/17/2019  Recommendations for Outpatient Follow-up:  1. Follow up with PCP in 1-2 weeks 2. Please obtain BMP/CBC in one week 3. Ensure follow-up with hand surgery-Dr. Merlyn Lot  Admitted From:  Home   Disposition: Home   Home Health: No  Equipment/Devices: None  Discharge Condition: Stable  CODE STATUS: FULL CODE  Diet recommendation:  Regular   Brief Summary: See H&P, Labs, Consult and Test reports for all details in brief,Patient is a 38 y.o. female with history of depression, bipolar disorder-presenting with toxic encephalopathy and multiple lacerations in her bilateral forearms in the setting of drug overdose/suicidal attempt.  Brief Hospital Course: Acute toxic encephalopathy in the setting of drug overdose/suicidal attempt: Completely awake and alert-was treated with supportive care.   Drug overdose/suicidal attempt: Per H&P-took 65 tablets of Xanax (5 mg) and approximately 2 bottles of NyQuil (to 30 mL).  RN contacted poison control-recommendations are for supportive care-Tylenol levels only minimally elevated and per poison control does not require any further therapy at this point.    Evaluated by psychiatry-with recommendations to transfer to inpatient psych-currently stable to be transferred.  Multiple lacerations in the bilateral forearms: Evaluated by Dr. Rosanne Sack surgery-no recommendations for any procedures while inpatient, he thinks patient may require a outpatient exploration-his office will arrange.  Tdap given in the emergency room, no role for antibiotics as not infected.    AKI: Resolved-likely hemodynamically mediated.  Follow electrolytes periodically  Bipolar disorder/depression/anxiety: Per patient's sister-she has been struggling  with depression and bipolar disorder all her life-she has never had a prior suicidal attempt, although she has had prior suicidal thoughts in the past.  Evaluated by psychiatry-recommendations are to transfer to inpatient psychiatry-we will resume Topamax, Lamictal, Adderall, Xanax is completely awake/alert and back to baseline.  Procedures/Studies: None  Discharge Diagnoses:  Principal Problem:   Suicide attempt [facility]) Active Problems:   Bipolar I disorder (HCC)   Essential (primary) hypertension   Depression   ADHD (attention deficit hyperactivity disorder)   Borderline personality disorder (HCC)   Overdose   Suicidal behavior with attempted self-injury (HCC)   AKI (acute kidney injury) (HCC)   Leukocytosis   Laceration of multiple sites of skin   Toxic encephalopathy   Discharge Instructions:  Activity:  As tolerated   Discharge Instructions    Diet general   Complete by:  As directed    Increase activity slowly   Complete by:  As directed      Allergies as of 02/17/2019   No Known Allergies     Medication List    TAKE these medications   Adderall 20 MG tablet Generic drug:  amphetamine-dextroamphetamine Take 20 mg by mouth 3 (three) times daily.   Aimovig (140 MG Dose) 70 MG/ML Soaj Generic drug:  Erenumab-aooe Inject 70 mg into the skin every 30 (thirty) days.   ALPRAZolam 0.5 MG tablet Commonly known as:  XANAX Take 0.5 mg by mouth 2 (two) times daily as needed for anxiety.   lamoTRIgine 150 MG tablet Commonly known as:  LAMICTAL Take 150 mg by mouth 2 (two) times daily.   prazosin 2 MG capsule Commonly known as:  MINIPRESS Take 2-4 mg by mouth at bedtime.   SUMAtriptan 50 MG tablet Commonly known as:  IMITREX Take 50 mg by mouth every 2 (two) hours as  needed for migraine. May repeat in 2 hours if headache persists or recurs.   topiramate 100 MG tablet Commonly known as:  TOPAMAX      Follow-up Information    [name]., MD.  Schedule an appointment as soon as possible for a visit in 1 week(s).   Specialty:  Family Medicine Contact information: [address] [place] [address] [phone]          No Known Allergies  Consultations:   psychiatry   Other Procedures/Studies:  No results found.   TODAY-DAY OF DISCHARGE:  Subjective:   Shelby Gallegos today has no headache,no chest abdominal pain,no new weakness tingling or numbness, feels much better wants to go home today.   Objective:   Blood pressure 117/76, pulse 77, temperature 98.2 F (36.8 C), temperature source Oral, resp. rate 20, height  (1.702 m), weight 96.6 kg, SpO2 97 %.  Intake/Output Summary (Last 24 hours) at 02/17/2019 1036 Last data filed at 02/17/2019 0831 Gross per 24 hour  Intake 340 ml  Output -  Net 340 ml   Filed Weights   02/15/19 2315  Weight: 96.6 kg    Exam: Awake Alert, Oriented *3, No new F.N deficits, Normal affect Simpson.AT,PERRAL Supple Neck,No JVD, No cervical lymphadenopathy appriciated.  Symmetrical Chest wall movement, Good air movement bilaterally, CTAB RRR,No Gallops,Rubs or new Murmurs, No Parasternal Heave +ve B.Sounds, Abd Soft, Non tender, No organomegaly appriciated, No rebound -guarding or rigidity. No Cyanosis, Clubbing or edema, No new Rash or bruise   PERTINENT RADIOLOGIC STUDIES: No results found.   PERTINENT LAB RESULTS: CBC: Recent Labs    02/16/19 0428 02/17/19 0421  WBC 7.1 7.0  HGB 9.9* 10.3*  HCT 31.3* 33.8*  PLT 263 259   CMET CMP     Component Value Date/Time   NA 138 02/17/2019 0421   NA 143 05/25/2017 0953   K 3.5 02/17/2019 0421   CL 109 02/17/2019 0421   CO2 22 02/17/2019 0421   GLUCOSE 93 02/17/2019 0421   BUN 7 02/17/2019 0421   BUN 9 05/25/2017 0953   CREATININE 0.96 02/17/2019 0421   CALCIUM 8.2 (L) 02/17/2019 0421   PROT 5.3 (L) 02/16/2019 0428   PROT 6.4 05/25/2017 0953   ALBUMIN 2.7 (L) 02/16/2019 0428   ALBUMIN 4.0  05/25/2017 0953   AST 20 02/16/2019 0428   ALT 20 02/16/2019 0428   ALKPHOS 54 02/16/2019 0428   BILITOT 0.3 02/16/2019 0428   BILITOT <0.2 05/25/2017 0953   GFRNONAA >60 02/17/2019 0421   GFRAA >60 02/17/2019 0421    GFR Estimated Creatinine Clearance: 95.8 mL/min (by C-G formula based on SCr of 0.96 mg/dL). No results for input(s): LIPASE, AMYLASE in the last 72 hours. No results for input(s): CKTOTAL, CKMB, CKMBINDEX, TROPONINI in the last 72 hours. Invalid input(s): POCBNP No results for input(s): DDIMER in the last 72 hours. No results for input(s): HGBA1C in the last 72 hours. No results for input(s): CHOL, HDL, LDLCALC, TRIG, CHOLHDL, LDLDIRECT in the last 72 hours. No results for input(s): TSH, T4TOTAL, T3FREE, THYROIDAB in the last 72 hours.  Invalid input(s): FREET3 No results for input(s): VITAMINB12, FOLATE, FERRITIN, TIBC, IRON, RETICCTPCT in the last 72 hours. Coags: Recent Labs    02/15/19 1948  INR 1.1   Microbiology: Recent Results (from the past 240 hour(s))  Culture, blood (Routine X 2) w Reflex to ID Panel     Status: None (Preliminary result)   Collection Time:  02/15/19 11:55 PM  Result Value Ref Range Status   Specimen Description BLOOD RIGHT HAND  Final   Special Requests   Final    BOTTLES DRAWN AEROBIC AND ANAEROBIC Blood Culture results may not be optimal due to an excessive volume of blood received in culture bottles   Culture   Final    NO GROWTH 1 DAY Performed at [facility], [address], [place], [place] [address]    Report Status PENDING  Incomplete  Culture, blood (Routine X 2) w Reflex to ID Panel     Status: None (Preliminary result)   Collection Time: 02/16/19 12:10 AM  Result Value Ref Range Status   Specimen Description BLOOD LEFT HAND  Final   Special Requests   Final    BOTTLES DRAWN AEROBIC AND ANAEROBIC Blood Culture adequate volume   Culture   Final    NO GROWTH 1 DAY Performed at [facility], [address], [place], [place] [address]    Report Status PENDING  Incomplete  MRSA PCR Screening     Status: None   Collection Time: 02/16/19  3:49 AM  Result Value Ref Range Status   MRSA by PCR NEGATIVE NEGATIVE Final    Comment:        The GeneXpert MRSA Assay (FDA approved for NASAL specimens only), is one component of a comprehensive MRSA colonization surveillance program. It is not intended to diagnose MRSA infection nor to guide or monitor treatment for MRSA infections. Performed at [facility], [address] [place], [place] [address]     FURTHER DISCHARGE INSTRUCTIONS:  Get Medicines reviewed and adjusted: Please take all your medications with you for your next visit with your Primary MD  Laboratory/radiological data: Please request your Primary MD to go over all hospital tests and procedure/radiological results at the follow up, please ask your Primary MD to get all Hospital records sent to his/her office.  In some cases, they will be blood work, cultures and biopsy results pending at the time of your discharge. Please request that your primary care M.D. goes through all the records of your hospital data and follows up on these results.  Also Note the following: If you experience worsening of your admission symptoms, develop shortness of breath, life threatening emergency, suicidal or homicidal thoughts you must seek medical attention immediately by calling 911 or calling your MD immediately  if symptoms less severe.  You must read complete instructions/literature along with all the possible adverse reactions/side effects for all the Medicines you take and that have been prescribed to you. Take any new Medicines after you have completely understood and accpet all the possible adverse reactions/side effects.   Do not drive when taking Pain medications or sleeping medications (Benzodaizepines)  Do not take more than prescribed Pain, Sleep and Anxiety Medications. It is  not advisable to combine anxiety,sleep and pain medications without talking with your primary care practitioner  Special Instructions: If you have smoked or chewed Tobacco  in the last 2 yrs please stop smoking, stop any regular Alcohol  and or any Recreational drug use.  Wear Seat belts while driving.  Please note: You were cared for by a hospitalist during your hospital stay. Once you are discharged, your primary care physician will handle any further medical issues. Please note that NO REFILLS for any discharge medications will be authorized once you are discharged, as it is imperative that you return to your primary care physician (  or establish a relationship with a primary care physician if you do not have one) for your post hospital discharge needs so that they can reassess your need for medications and monitor your lab values.  Total Time spent coordinating discharge including counseling, education and face to face time equals 25  minutes.  SignedJeoffrey Massed 02/17/2019 10:36 AM

## 2019-02-17 NOTE — Progress Notes (Signed)
Patient is completely awake and alert-has ambulated to the bathroom without any assistance.  Stable to be discharged to [facility]/inpatient psychiatry when bed available.  Full note to follow shortly.

## 2019-02-17 NOTE — Progress Notes (Signed)
Clinical Social Worker facilitated patient discharge including contacting patient family and facility to confirm patient discharge plans.  Clinical information faxed to facility and patient agreeable with plan. Patient will be transported by [facility] [phone]) with facility requesting transport set at 8:00pm. RN provided contact to call for transport.  RN to call for report prior to [phone]) Room 407-1.  Clinical Social Worker will sign off for now as social work intervention is no longer needed. Please consult [place] again if new need arises.  Mahamad Derik Fults LCSW [phone]

## 2019-02-17 NOTE — Discharge Instructions (Signed)
Accidental Drug Poisoning, Adult Accidental drug poisoning happens when a person accidentally takes too much of a substance, such as a prescription medicine, an over-the-counter medicine, a vitamin, a supplement, or an illegal drug. The effects of drug poisoning can be mild, dangerous, or even deadly. What are the causes? This condition is caused by taking too much of a medicine, illegal drug, or other substance. It often results from:  Lack of knowledge about a substance.  Using more than one substance at the same time.  An error made by the health care provider who prescribed the substance.  An error made by the pharmacist who filled the prescription.  A lapse in memory, such as forgetting that you have already taken a dose of the medicine.  Suddenly using a substance after a long period of not using it. The following substances and medicines are more likely to cause an accidental drug poisoning:  Medicines that treat mental problems (psychotropic medicines).  Pain medicines.  Cocaine.  Heroin.  Multivitamins that contain iron.  Over-the-counter cold and cough medicines. What increases the risk? This condition is more likely to occur in:  Elderly adults. Elderly adults are at risk because they may: ? Be taking many different medicines. ? Have difficulty reading labels. ? Forget when they last took their medicine.  People who use illegal drugs.  People who drink alcohol while using illegal drugs or certain medicines.  People with certain mental health conditions. What are the signs or symptoms? Symptoms of this condition depend on the substance and the amount that was taken. Common symptoms include:  Behavior changes, such as confusion.  Sleepiness.  Weakness.  Slowed breathing.  Nausea and vomiting.  Seizures.  Very large or small eye pupil size. A drug poisoning can cause a very serious condition in which your blood pressure drops to a low level (shock).  Symptoms of shock include:  Cold and clammy skin.  Pale skin.  Blue lips.  Very slow breathing.  Extreme sleepiness.  Severe confusion.  Dizziness or fainting. How is this diagnosed? This condition is diagnosed based on:  Your symptoms. You will be asked about the substances you took and when you took them.  A physical exam. You may also have other tests, including:  Urine tests.  Blood tests.  An electrocardiogram (ECG). How is this treated? This condition may need to be treated right away at the hospital. Treatment may involve:  Getting fluids and electrolytes through an IV.  Having a breathing tube inserted in your airway (endotracheal tube) to help you breathe.  Taking medicines. These may include medicines that: ? Absorb any substance that is in your digestive system. ? Block or reverse the effect of the substance that caused the drug poisoning.  Having your blood filtered through an artificial kidney machine (hemodialysis).  Ongoing counseling and mental health support. This may be provided if you used an illegal drug. Follow these instructions at home: Medicines   Take over-the-counter and prescription medicines only as told by your health care provider.  Before taking a new medicine, ask your health care provider whether the medicine: ? May cause side effects. ? Might react with other medicines.  Keep a list of all the medicines that you take, including over-the-counter medicines, vitamins, supplements, and herbs. Bring this list with you to all of your medical visits. General instructions   Drink enough fluid to keep your urine pale yellow.  If you are working with a Social worker or Education officer, community, make  sure to follow his or her instructions.  Do not drink alcohol if: ? Your health care provider tells you not to drink. ? You are pregnant, may be pregnant, or are planning to become pregnant.  If you drink alcohol, limit how much you  have: ? 0-1 drink a day for women. ? 0-2 drinks a day for men.  Be aware of how much alcohol is in your drink. In the [place] one drink equals one typical bottle of beer (12 oz), one-half glass of wine (5 oz), or one shot of hard liquor (1 oz).  Keep all follow-up visits as told by your health care provider. This is important. How is this prevented?   Get help if you are struggling with: ? Alcohol or drug use. ? Depression or another mental health problem.  Keep the phone number of your local poison control center near your phone or on your cell phone. The hotline of the [employer] is (800(306) 070-5927.  Store all medicines in safety containers that are out of the reach of children.  Read the drug inserts that come with your medicines.  Create a system for taking your medicine, such as a pillbox, that will help you avoid taking too much of the medicine.  Do not drink alcohol while taking medicines unless your health care provider approves.  Do not use illegal drugs.  Do not take medicines that are not prescribed for you. Contact a health care provider if:  Your symptoms return.  You develop new symptoms or side effects after taking a medicine.  You have questions about possible drug poisoning. Call your local poison control center at (334)772-4920. Get help right away if:  You think that you or someone else may have taken too much of a substance.  You or someone else is having symptoms of drug poisoning. Summary  Accidental drug poisoning happens when a person accidentally takes too much of a substance, such as a prescription medicine, an over-the-counter medicine, a vitamin, a supplement, or an illegal drug.  The effects of drug poisoning can be mild, dangerous, or even deadly.  This condition is diagnosed based on your symptoms and a physical exam. You will be asked to tell your health care provider which substances you took and when you  took them.  This condition may need to be treated right away at the hospital. This information is not intended to replace advice given to you by your health care provider. Make sure you discuss any questions you have with your health care provider. Document Released: 01/29/2005 Document Revised: 10/17/2017 Document Reviewed: 10/17/2017 Elsevier Interactive Patient Education  2019 [employer]   Accidental Drug Poisoning, Adult Accidental drug poisoning happens when a person accidentally takes too much of a substance, such as a prescription medicine, an over-the-counter medicine, a vitamin, a supplement, or an illegal drug. The effects of drug poisoning can be mild, dangerous, or even deadly. What are the causes? This condition is caused by taking too much of a medicine, illegal drug, or other substance. It often results from:  Lack of knowledge about a substance.  Using more than one substance at the same time.  An error made by the health care provider who prescribed the substance.  An error made by the pharmacist who filled the prescription.  A lapse in memory, such as forgetting that you have already taken a dose of the medicine.  Suddenly using a substance after a long period of not  using it. The following substances and medicines are more likely to cause an accidental drug poisoning:  Medicines that treat mental problems (psychotropic medicines).  Pain medicines.  Cocaine.  Heroin.  Multivitamins that contain iron.  Over-the-counter cold and cough medicines. What increases the risk? This condition is more likely to occur in:  Elderly adults. Elderly adults are at risk because they may: ? Be taking many different medicines. ? Have difficulty reading labels. ? Forget when they last took their medicine.  People who use illegal drugs.  People who drink alcohol while using illegal drugs or certain medicines.  People with certain mental health conditions. What are  the signs or symptoms? Symptoms of this condition depend on the substance and the amount that was taken. Common symptoms include:  Behavior changes, such as confusion.  Sleepiness.  Weakness.  Slowed breathing.  Nausea and vomiting.  Seizures.  Very large or small eye pupil size. A drug poisoning can cause a very serious condition in which your blood pressure drops to a low level (shock). Symptoms of shock include:  Cold and clammy skin.  Pale skin.  Blue lips.  Very slow breathing.  Extreme sleepiness.  Severe confusion.  Dizziness or fainting. How is this diagnosed? This condition is diagnosed based on:  Your symptoms. You will be asked about the substances you took and when you took them.  A physical exam. You may also have other tests, including:  Urine tests.  Blood tests.  An electrocardiogram (ECG). How is this treated? This condition may need to be treated right away at the hospital. Treatment may involve:  Getting fluids and electrolytes through an IV.  Having a breathing tube inserted in your airway (endotracheal tube) to help you breathe.  Taking medicines. These may include medicines that: ? Absorb any substance that is in your digestive system. ? Block or reverse the effect of the substance that caused the drug poisoning.  Having your blood filtered through an artificial kidney machine (hemodialysis).  Ongoing counseling and mental health support. This may be provided if you used an illegal drug. Follow these instructions at home: Medicines   Take over-the-counter and prescription medicines only as told by your health care provider.  Before taking a new medicine, ask your health care provider whether the medicine: ? May cause side effects. ? Might react with other medicines.  Keep a list of all the medicines that you take, including over-the-counter medicines, vitamins, supplements, and herbs. Bring this list with you to all of your  medical visits. General instructions   Drink enough fluid to keep your urine pale yellow.  If you are working with a counselor or mental health professional, make sure to follow his or her instructions.  Do not drink alcohol if: ? Your health care provider tells you not to drink. ? You are pregnant, may be pregnant, or are planning to become pregnant.  If you drink alcohol, limit how much you have: ? 0-1 drink a day for women. ? 0-2 drinks a day for men.  Be aware of how much alcohol is in your drink. In the [place] one drink equals one typical bottle of beer (12 oz), one-half glass of wine (5 oz), or one shot of hard liquor (1 oz).  Keep all follow-up visits as told by your health care provider. This is important. How is this prevented?   Get help if you are struggling with: ? Alcohol or drug use. ? Depression or another mental health problem.  Keep the phone number of your local poison control center near your phone or on your cell phone. The hotline of the [employer] is (8005794196548.  Store all medicines in safety containers that are out of the reach of children.  Read the drug inserts that come with your medicines.  Create a system for taking your medicine, such as a pillbox, that will help you avoid taking too much of the medicine.  Do not drink alcohol while taking medicines unless your health care provider approves.  Do not use illegal drugs.  Do not take medicines that are not prescribed for you. Contact a health care provider if:  Your symptoms return.  You develop new symptoms or side effects after taking a medicine.  You have questions about possible drug poisoning. Call your local poison control center at [phone]. Get help right away if:  You think that you or someone else may have taken too much of a substance.  You or someone else is having symptoms of drug poisoning. Summary  Accidental drug poisoning  happens when a person accidentally takes too much of a substance, such as a prescription medicine, an over-the-counter medicine, a vitamin, a supplement, or an illegal drug.  The effects of drug poisoning can be mild, dangerous, or even deadly.  This condition is diagnosed based on your symptoms and a physical exam. You will be asked to tell your health care provider which substances you took and when you took them.  This condition may need to be treated right away at the hospital. This information is not intended to replace advice given to you by your health care provider. Make sure you discuss any questions you have with your health care provider. Document Released: 01/29/2005 Document Revised: 10/17/2017 Document Reviewed: 10/17/2017 Elsevier Interactive Patient Education  2019 [employer].

## 2019-02-17 NOTE — Progress Notes (Signed)
Social worker made aware that pt is medically clear and ready to be discharged when bed available at [facility].

## 2019-02-17 NOTE — Progress Notes (Signed)
Referral placed for [facility]. Facility will review and contact this Clinical research associate for potential bed offer. Will continue to assist as needed.

## 2019-02-17 NOTE — Progress Notes (Signed)
Shelby Gallegos discharged to [facility] per MD order.  Discharge instructions reviewed and discussed with the patient, all questions and concerns answered. Copy of instructions and scripts given to patient.  Allergies as of 02/17/2019   No Known Allergies     Medication List    TAKE these medications   Adderall 20 MG tablet Generic drug:  amphetamine-dextroamphetamine Take 20 mg by mouth 3 (three) times daily.   Aimovig (140 MG Dose) 70 MG/ML Soaj Generic drug:  Erenumab-aooe Inject 70 mg into the skin every 30 (thirty) days.   ALPRAZolam 0.5 MG tablet Commonly known as:  XANAX Take 0.5 mg by mouth 2 (two) times daily as needed for anxiety.   lamoTRIgine 150 MG tablet Commonly known as:  LAMICTAL Take 150 mg by mouth 2 (two) times daily.   prazosin 2 MG capsule Commonly known as:  MINIPRESS Take 2-4 mg by mouth at bedtime.   SUMAtriptan 50 MG tablet Commonly known as:  IMITREX Take 50 mg by mouth every 2 (two) hours as needed for migraine. May repeat in 2 hours if headache persists or recurs.   topiramate 100 MG tablet Commonly known as:  TOPAMAX       Patients skin is clean, dry and intact, no evidence of skin break down. IV site discontinued and catheter remains intact. Site without signs and symptoms of complications. Dressing and pressure applied.  [facility] contacted and transported patient to [facility]. Patient escorted to car by NT,  no distress noted upon discharge.  Dorann Lodge 02/17/2019 7:31 PM

## 2019-02-18 ENCOUNTER — Other Ambulatory Visit: Payer: Self-pay

## 2019-02-18 ENCOUNTER — Encounter (HOSPITAL_COMMUNITY): Payer: Self-pay

## 2019-02-18 DIAGNOSIS — F319 Bipolar disorder, unspecified: Principal | ICD-10-CM

## 2019-02-18 DIAGNOSIS — T1491XA Suicide attempt, initial encounter: Secondary | ICD-10-CM

## 2019-02-18 LAB — TSH: TSH: 6.718 u[IU]/mL — ABNORMAL HIGH (ref 0.350–4.500)

## 2019-02-18 MED ORDER — HYDROXYZINE HCL 50 MG PO TABS
50.0000 mg | ORAL_TABLET | Freq: Once | ORAL | Status: AC
  Administered 2019-02-18: 50 mg via ORAL

## 2019-02-18 MED ORDER — HYDROXYZINE HCL 50 MG PO TABS
ORAL_TABLET | ORAL | Status: AC
  Filled 2019-02-18: qty 1

## 2019-02-18 MED ORDER — BACITRACIN-NEOMYCIN-POLYMYXIN 400-5-5000 EX OINT
TOPICAL_OINTMENT | Freq: Two times a day (BID) | CUTANEOUS | Status: DC
  Administered 2019-02-18 – 2019-02-19 (×2): via TOPICAL

## 2019-02-18 MED ORDER — LORAZEPAM 0.5 MG PO TABS
0.5000 mg | ORAL_TABLET | Freq: Four times a day (QID) | ORAL | Status: DC | PRN
  Administered 2019-02-18: 0.5 mg via ORAL
  Filled 2019-02-18: qty 1

## 2019-02-18 MED ORDER — LURASIDONE HCL 20 MG PO TABS
20.0000 mg | ORAL_TABLET | Freq: Every day | ORAL | Status: DC
  Administered 2019-02-19 – 2019-02-20 (×2): 20 mg via ORAL
  Filled 2019-02-18 (×3): qty 1

## 2019-02-18 MED ORDER — HYDROXYZINE HCL 25 MG PO TABS
25.0000 mg | ORAL_TABLET | Freq: Four times a day (QID) | ORAL | Status: DC | PRN
  Administered 2019-02-19 (×2): 25 mg via ORAL
  Filled 2019-02-18 (×2): qty 1

## 2019-02-18 NOTE — Progress Notes (Signed)
Shelby Gallegos is a 38 y.o. female Voluntary admitted for suicide attempt from [facility]. Pt stated she feeling dis appointment to his family. His parents are supportive but she has nothing to show them. 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 Pt stated she is also suffering from PTSD caused by the nature of her work. Pt stated she drunk 2 of 230 ml NyQuil and took 65 of 5 mg xanax in an attempt to kill her self. Pt still unsteady on her feet due to OD. Pt alert and oriented x 4, calm and cooperative with admission process. Denied SI/HI, AVH and verbally contracted for safety. Consents signed, skin/belongings search completed and pt oriented to unit. Pt stable at this time. Pt given the opportunity to express concerns and ask questions. Pt given toiletries. Will continue to monitor.

## 2019-02-18 NOTE — [facility] Suicide Risk Assessment (Signed)
[facility] Admission Suicide Risk Assessment   Nursing information obtained from:  Patient Demographic factors:  Living alone, Unemployed, Caucasian, Low socioeconomic status Current Mental Status:  Suicidal ideation indicated by patient Loss Factors:  Financial problems / change in socioeconomic status, Loss of significant relationship Historical Factors:  Impulsivity Risk Reduction Factors:  Positive therapeutic relationship  Total Time spent with patient: 45 minutes Principal Problem: S/P Suicide Attempt, Bipolar Disorder, PTSD Diagnosis:  Active Problems:   Bipolar 1 disorder (HCC)  Subjective Data:  Continued Clinical Symptoms:  Alcohol Use Disorder Identification Test Final Score (AUDIT): 0 The "Alcohol Use Disorders Identification Test", Guidelines for Use in Primary Care, Second Edition.  World Science writer [facility]). Score between 0-7:  no or low risk or alcohol related problems. Score between 8-15:  moderate risk of alcohol related problems. Score between 16-19:  high risk of alcohol related problems. Score 20 or above:  warrants further diagnostic evaluation for alcohol dependence and treatment.   CLINICAL FACTORS:  38 year old female, status post suicide attempt on 3/19 by overdosing on alprazolam, NyQuil, and cutting both her forearms, which required multiple staples.  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  She acknowledges recently worsening depression, neurovegetative symptoms.  Psychiatric Specialty Exam: Physical Exam  ROS  Blood pressure 101/68, pulse (!) 109, temperature 98 F (36.7 C), temperature source Oral, resp. rate 20, height 5\' 7"  (1.702 m), weight 94.8 kg, SpO2 100 %.Body mass index is 32.73 kg/m.  See admit note MSE  COGNITIVE FEATURES THAT CONTRIBUTE TO RISK:  Closed-mindedness and Loss of executive function    SUICIDE RISK:   Moderate:  Frequent suicidal ideation with limited intensity, and  duration, some specificity in terms of plans, no associated intent, good self-control, limited dysphoria/symptomatology, some risk factors present, and identifiable protective factors, including available and accessible social support.  PLAN OF CARE: Patient will be admitted to inpatient psychiatric unit for stabilization and safety. Will provide and encourage milieu participation. Provide medication management and maked adjustments as needed.  Will follow daily.    I certify that inpatient services furnished can reasonably be expected to improve the patient's condition.   Craige Cotta, MD 02/18/2019, 3:32 PM

## 2019-02-18 NOTE — Plan of Care (Signed)
  Problem: Medication: Goal: Compliance with prescribed medication regimen will improve Outcome: Progressing   Problem: Safety: Goal: Ability to disclose and discuss suicidal ideas will improve Outcome: Progressing  DAR NOTE: Patient presents with anxious affect and depressed mood.  Denies suicidal thoughts, auditory and visual hallucinations.  Described energy level as low and concentration as poor.  Dressing to both arms changed.  Staples intact.  No signs and symptoms infection noted.  Rates depression at 8, hopelessness at 4, and anxiety at 9.  Maintained on routine safety checks.  Medications given as prescribed.  Support and encouragement offered as needed.  Attended group and participated.  States goal for today is "to handle my depression and hopelessness in a  better manner."  Patient visible in milieu with minimal interaction.  Patient is safe on the unit.

## 2019-02-18 NOTE — H&P (Addendum)
Psychiatric Admission Assessment Adult  Patient Identification: Shelby Gallegos MRN:  [id number] Date of Evaluation:  02/18/2019 Chief Complaint:   Suicide Attempt Principal Diagnosis:  Bipolar Disorder, Depressed. S/P Suicide Attempt Diagnosis:  Active Problems:   Bipolar 1 disorder (HCC)  History of Present Illness: 38 year old female. Attempted suicide 3/19 by overdosing on prescribed Alprazolam  And on OTC sleeping medication ( Nyquil) . States she took " a whole bottle of Xanax). Following overdose she also cut herself on both forearms resulting in significant blood loss, needing multiple staples. States a friend contacted 911 and was brought to ED. States suicide attempt had been been formed/planned earlier that day and that at the time " I felt really at peace with it". She cannot identify any specific triggers that may have exacerbated her depression or suicidal ideations. States " sometimes I get into these states ".  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  Total Time spent with patient: 45 minutes  Past Psychiatric History: history of prior psychiatric admissions ( 97, 2018) for depression and suicidal ideations. Denies  prior suicidal attempts, denies prior history of cutting or self injurious behaviors. Reports history of Bipolar Disorder, PTSD, ADHD diagnosis in the past. Describes episodes of increased energy, grandiose ideations, spending excessive money resulting in debt . Reports history of psychotic symptoms during episodes of mania , not otherwise . Describes history of panic attacks, does not endorse agoraphobia   Is the patient at risk to self? Yes.    Has the patient been a risk to self in the past 6 months? Yes.    Has the patient been a risk to self within the distant past? No.  Is the patient a risk to others? No.  Has the patient been a risk to others in the past 6 months? No.  Has the patient been a risk to others within the distant past? No.   Prior Inpatient Therapy:  As above Prior Outpatient Therapy:  Dr. Evelene Croon   Alcohol Screening: 1. How often do you have a drink containing alcohol?: Never 2. How many drinks containing alcohol do you have on a typical day when you are drinking?: 1 or 2 3. How often do you have six or more drinks on one occasion?: Never AUDIT-C Score: 0 4. How often during the last year have you found that you were not able to stop drinking once you had started?: Never 5. How often during the last year have you failed to do what was normally expected from you becasue of drinking?: Never 6. How often during the last year have you needed a first drink in the morning to get yourself going after a heavy drinking session?: Never 7. How often during the last year have you had a feeling of guilt of remorse after drinking?: Never 8. How often  during the last year have you been unable to remember what happened the night before because you had been drinking?: Never 9. Have you or someone else been injured as a result of your drinking?: No 10. Has a relative or friend or a doctor or another health worker been concerned about your drinking or suggested you cut down?: No Alcohol Use  Disorder Identification Test Final Score (AUDIT): 0 Alcohol Brief Interventions/Follow-up: AUDIT Score <7 follow-up not indicated Substance Abuse History in the last 12 months: denies history of alcohol or drug abuse . States she is prescribed Xanax PRNs, denies any history of BZD abuse or misuse  Consequences of Substance Abuse: Denies  Previous Psychotropic Medications: Xanax 0.5 mgrs BID PRN- x 2 years, states she has periods of days when she does not take it at all, denies abusing, Adderall 20 mgrs TID for several years , Lamictal 150 mgrs BID for several years , Topamax - does not remember dose - for migraines, Minipress 2 mgrs QHS for PTSD, recently restarted  Psychological Evaluations:  Denies  Past Medical History:  Denies medical illnesses other than migraines. NKDA. Past Medical History:  Diagnosis Date  . ADHD (attention deficit hyperactivity disorder)   . Bipolar 1 disorder (HCC)   . Bipolar 1 disorder (HCC)   . Migraine   . Migraine     Past Surgical History:  Procedure Laterality Date  . NO PAST SURGERIES     Family History: parents alive, live together in [place], [place]. She has one sister . Family History  Problem Relation Age of Onset  . Atrial fibrillation Mother   . Bipolar disorder Paternal Uncle    Family Psychiatric  History: An paternal uncle has Bipolar Disorder. History of alcohol use disorder in extended family. Denies suicides in family. Tobacco Screening: Have you used any form of tobacco in the last 30 days? (Cigarettes, Smokeless Tobacco, Cigars, and/or Pipes): No Social History: single, no children, lives alone, currently employed ( started recently) , denies legal issues  Social History   Substance and Sexual Activity  Alcohol Use No     Social History   Substance and Sexual Activity  Drug Use No    Additional Social History:      History of alcohol / drug use?: No history of alcohol / drug abuse   Allergies:  No Known Allergies Lab Results:   Results for orders placed or performed during the hospital encounter of 02/17/19 (from the past 48 hour(s))  TSH     Status: Abnormal   Collection Time: 02/18/19  6:35 AM  Result Value Ref Range   TSH 6.718 (H) 0.350 - 4.500 uIU/mL    Comment: Performed by a 3rd Generation assay with a functional sensitivity of <=0.01 uIU/mL. Performed at [facility], [address], [place], [place] [address]     Blood Alcohol level:  Lab Results  Component Value Date   ETH <10 02/15/2019    Metabolic Disorder Labs:  No results found for: HGBA1C, MPG No results found for: PROLACTIN Lab Results  Component Value Date   CHOL 174 05/25/2017   TRIG 50 05/25/2017   HDL 63 05/25/2017   CHOLHDL 2.8 05/25/2017   LDLCALC 101 (H) 05/25/2017   LDLCALC 113 07/31/2015    Current Medications: Current Facility-Administered Medications  Medication Dose Route Frequency Provider Last Rate Last Dose  . acetaminophen (TYLENOL) tablet 650 mg  650 mg Oral Q6H PRN Oneta Rack, NP   650 mg at 02/18/19  [id number]  . alum & mag hydroxide-simeth (MAALOX/MYLANTA) 200-200-20 MG/5ML suspension 30 mL  30 mL Oral Q4H PRN Oneta RackLewis, Tanika N, NP      . lamoTRIgine (LAMICTAL) tablet 150 mg  150 mg Oral BID Oneta RackLewis, Tanika N, NP   150 mg at 02/18/19 0813  . magnesium hydroxide (MILK OF MAGNESIA) suspension 30 mL  30 mL Oral Daily PRN Oneta RackLewis, Tanika N, NP      . ondansetron (ZOFRAN-ODT) disintegrating tablet 4 mg  4 mg Oral Q8H PRN Oneta RackLewis, Tanika N, NP      . prazosin (MINIPRESS) capsule 2 mg  2 mg Oral QHS Nira ConnBerry, Jason A, NP   2 mg at 02/17/19 2300  . topiramate (TOPAMAX) tablet 100 mg  100 mg Oral Daily Oneta RackLewis, Tanika N, NP   100 mg at 02/18/19 0813  . traZODone (DESYREL) tablet 50 mg  50 mg Oral QHS PRN Oneta RackLewis, Tanika N, NP       PTA Medications: Medications Prior to Admission  Medication Sig Dispense Refill Last Dose  . ALPRAZolam (XANAX) 0.5 MG tablet Take 0.5 mg by mouth 2 (two) times daily as needed for  anxiety.    02/14/2019  . amphetamine-dextroamphetamine (ADDERALL) 20 MG tablet Take 20 mg by mouth 3 (three) times daily.    02/14/2019  . Erenumab-aooe (AIMOVIG, 140 MG DOSE,) 70 MG/ML SOAJ Inject 70 mg into the skin every 30 (thirty) days.   unk  . lamoTRIgine (LAMICTAL) 150 MG tablet Take 150 mg by mouth 2 (two) times daily.   02/14/2019  . prazosin (MINIPRESS) 2 MG capsule Take 2-4 mg by mouth at bedtime.   unk  . SUMAtriptan (IMITREX) 50 MG tablet Take 50 mg by mouth every 2 (two) hours as needed for migraine. May repeat in 2 hours if headache persists or recurs.   unk  . topiramate (TOPAMAX) 100 MG tablet    unk    Musculoskeletal: Strength & Muscle Tone: within normal limits Gait & Station: normal Patient leans: N/A  Psychiatric Specialty Exam: Physical Exam  Review of Systems  Constitutional: Negative.   HENT: Negative.   Eyes: Negative.   Respiratory: Negative for cough, shortness of breath and wheezing.   Cardiovascular: Negative.   Gastrointestinal: Negative for blood in stool, diarrhea, nausea and vomiting.  Genitourinary: Negative.   Musculoskeletal: Negative.   Skin: Negative.   Neurological: Positive for headaches. Negative for seizures.       History of migraines   Endo/Heme/Allergies: Negative.   Psychiatric/Behavioral: Positive for depression and suicidal ideas.  All other systems reviewed and are negative.   Blood pressure 101/68, pulse (!) 109, temperature 98 F (36.7 C), temperature source Oral, resp. rate 20, height 5\' 7"  (1.702 m), weight 94.8 kg, SpO2 100 %.Body mass index is 32.73 kg/m.  General Appearance: Well Groomed  Eye Contact:  Good  Speech:  Normal Rate  Volume:  Normal  Mood:  reports feeling depressed, describes as a 4/10  Affect:  vaguely constricted and anxious   Thought Process:  Linear and Descriptions of Associations: Intact  Orientation:  Other:  fully alert and attentive   Thought Content:  denies hallucinations, no delusions, not  internally preoccupied   Suicidal Thoughts:  No denies current suicidal or self injurious ideations and contracts for safety on unit, denies homicidal or violent ideations   Homicidal Thoughts:  No denies   Memory:  recent and remote grossly intact   Judgement:  Fair  Insight:  Fair  Psychomotor Activity:  Normal  Concentration:  Concentration: Good and Attention Span: Good  Recall:  Good  Fund of Knowledge:  Good  Language:  Good  Akathisia:  Negative  Handed:  Right  AIMS (if indicated):     Assets:  Communication Skills Desire for Improvement Resilience  ADL's:  Intact  Cognition:  WNL  Sleep:  Number of Hours: 5.75    Treatment Plan Summary: Daily contact with patient to assess and evaluate symptoms and progress in treatment, Medication management, Plan inpatient treatment  and medications as below  Observation Level/Precautions:  15 minute checks  Laboratory: TSH is mildly elevated-we will check FT3/FT4  Psychotherapy:  Milieu, group therapy   Medications:  We discussed options- patient reports history of good response to Lamictal , Minipress, and feels Topamax helps decrease migraines . She states [name] has been helpful in the past We will start Latuda at 20 mg daily, continue Lamictal at 150 mg twice daily, continue Minipress at 2 mg nightly, continue Topamax at 100 mg daily, Ativan 0.5 mg every 6 hours as needed for anxiety as needed. Will not resume Adderall at this time   Consultations:  As needed   Discharge Concerns:  -  Estimated LOS: 5 to 6 days  Other:     Physician Treatment Plan for Primary Diagnosis: Bipolar disorder, depressed Long Term Goal(s): Improvement in symptoms so as ready for discharge  Short Term Goals: Ability to identify changes in lifestyle to reduce recurrence of condition will improve, Ability to verbalize feelings will improve, Ability to disclose and discuss suicidal ideas, Ability to demonstrate self-control will improve, Ability to  identify and develop effective coping behaviors will improve and Ability to maintain clinical measurements within normal limits will improve  Physician Treatment Plan for Secondary Diagnosis: Suicide attempt Long Term Goal(s): Improvement in symptoms so as ready for discharge  Short Term Goals: Ability to identify changes in lifestyle to reduce recurrence of condition will improve, Ability to verbalize feelings will improve, Ability to disclose and discuss suicidal ideas, Ability to demonstrate self-control will improve, Ability to identify and develop effective coping behaviors will improve and Ability to maintain clinical measurements within normal limits will improve  I certify that inpatient services furnished can reasonably be expected to improve the patient's condition.    Craige Cotta, MD 3/22/20209:50 AM

## 2019-02-18 NOTE — Tx Team (Signed)
Initial Treatment Plan 02/18/2019 2:18 AM [name] [id number]    PATIENT STRESSORS: Financial difficulties Marital or family conflict   PATIENT STRENGTHS: Capable of independent living Manufacturing systems engineer Motivation for treatment/growth   PATIENT IDENTIFIED PROBLEMS: Anxiety  Depression  "Healthy boundaries"  How to deescalate""               DISCHARGE CRITERIA:  Ability to meet basic life and health needs Adequate post-discharge living arrangements Improved stabilization in mood, thinking, and/or behavior Verbal commitment to aftercare and medication compliance  PRELIMINARY DISCHARGE PLAN: Attend aftercare/continuing care group Attend PHP/IOP Outpatient therapy Return to previous living arrangement  PATIENT/FAMILY INVOLVEMENT: This treatment plan has been presented to and reviewed with the patient, Shelby Gallegos, and/or family member.  The patient and family have been given the opportunity to ask questions and make suggestions.  Bethann Punches, RN 02/18/2019, 2:18 AM

## 2019-02-18 NOTE — Progress Notes (Signed)
Writer spoke with patient 1:1 and she reports that she has felt really anxious after taking ativan. She reports feeling restless and has bee pacing hallway. Writer obtained an order for vistaril to hopefully help her to calm down. She reports that she feels that she has disappointed her family. Writer encouraged her to try and focus on the positives and spent time talking and listening to her thoughts. Support given and safety maintained on unit with 15 min checks.

## 2019-02-18 NOTE — [facility] Group Notes (Signed)
[facility] LCSW Group Therapy Note  Date/Time:  02/18/2019 9:00-10:00 or 10:00-11:00AM  Type of Therapy and Topic:  Group Therapy:  Healthy and Unhealthy Supports  Participation Level:  Did Not Attend   Description of Group:  Patients in this group were introduced to the idea of adding a variety of healthy supports to address the various needs in their lives.Patients discussed what additional healthy supports could be helpful in their recovery and wellness after discharge in order to prevent future hospitalizations.   An emphasis was placed on using counselor, doctor, therapy groups, 12-step groups, and problem-specific support groups to expand supports.  They also worked as a group on developing a specific plan for several patients to deal with unhealthy supports through boundary-setting, psychoeducation with loved ones, and even termination of relationships.   Therapeutic Goals:   1)  discuss importance of adding supports to stay well once out of the hospital  2)  compare healthy versus unhealthy supports and identify some examples of each  3)  generate ideas and descriptions of healthy supports that can be added  4)  offer mutual support about how to address unhealthy supports  5)  encourage active participation in and adherence to discharge plan    Summary of Patient Progress:  Did not attend  Therapeutic Modalities:   Motivational Interviewing Brief Solution-Focused Therapy  Gazelle Towe D Cozetta Seif         

## 2019-02-19 ENCOUNTER — Other Ambulatory Visit: Payer: Self-pay | Admitting: Orthopedic Surgery

## 2019-02-19 ENCOUNTER — Encounter (HOSPITAL_BASED_OUTPATIENT_CLINIC_OR_DEPARTMENT_OTHER): Payer: Self-pay | Admitting: *Deleted

## 2019-02-19 ENCOUNTER — Other Ambulatory Visit: Payer: Self-pay

## 2019-02-19 DIAGNOSIS — F419 Anxiety disorder, unspecified: Secondary | ICD-10-CM

## 2019-02-19 DIAGNOSIS — Z915 Personal history of self-harm: Secondary | ICD-10-CM

## 2019-02-19 LAB — LIPID PANEL
CHOL/HDL RATIO: 3.7 ratio
Cholesterol: 191 mg/dL (ref 0–200)
HDL: 52 mg/dL (ref >40)
LDL Cholesterol: 114 mg/dL — ABNORMAL HIGH (ref 0–99)
Triglycerides: 123 mg/dL (ref <150)
VLDL: 25 mg/dL (ref 0–40)

## 2019-02-19 LAB — T4, FREE: Free T4: 0.69 ng/dL — ABNORMAL LOW (ref 0.82–1.77)

## 2019-02-19 LAB — HEMOGLOBIN A1C
Hgb A1c MFr Bld: 5.6 % (ref 4.8–5.6)
Mean Plasma Glucose: 114.02 mg/dL

## 2019-02-19 NOTE — Plan of Care (Addendum)
Patient self inventory- Patient slept well last night, sleep medication was requested and was helpful. Appetite is good, energy level low, concentration poor. Depression, hopelessness, and anxiety rated 9, 7, 4 out of 10. Patient endorses headaches rated 4/10, as well as pain in forearms. Patient's goal is "to work on better coping skills and will accomplish this by making lists and journals. Patient is compliant with medications prescribed. Wound care is being performed as ordered. Patient states her arms are "not really" bothering her. Safety is maintained with 15 minute checks as well as environmental checks. Will continue to monitor and provide support.  Problem: Education: Goal: Emotional status will improve Outcome: Progressing Goal: Mental status will improve Outcome: Progressing Goal: Verbalization of understanding the information provided will improve Outcome: Progressing   Problem: Coping: Goal: Ability to identify and develop effective coping behavior will improve Outcome: Progressing
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
## 2019-02-19 NOTE — Progress Notes (Addendum)
[facility] MD Progress Note  02/19/2019 11:45 AM Shelby Gallegos  MRN:  [id number] Subjective:  "It's been a tough day."  Shelby Gallegos found sleeping in bed. She reports continuing depressed mood today with passive SI. Shelby Gallegos was started this morning. Regarding recent suicide attempt she states "I wish it would have worked." She denies suicidal plan or intent on the unit and contracts for safety. 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 Unable to identify other triggers. She does appear future oriented with discussion, states she would like to go back to school to become a videographer. States "today is not a good day but maybe tomorrow will be better." Denies AVH. She had an episode yesterday of restlessness, "feeling fidgety" and was pacing in the hallway, prior to initiation of Latuda. Denies restlessness at this time. She does report anxiety regarding possible surgery for tendon from self-inflicted wounds. Dressings to bilateral arms remain CDI, with ROM and sensation in both hands/wrists intact. TSH 6.718, T4 0.69, T3 pending.  From admission H&P: 38 year old female. Attempted suicide 3/19 by overdosing on prescribed Alprazolam  And on OTC sleeping medication ( Nyquil) . States she took " a whole bottle of Xanax). Following overdose she also cut herself on both forearms resulting in significant blood loss, needing multiple staples. States a friend contacted 911 and was brought to ED. States suicide attempt had been been formed/planned earlier that day and that at the time " I felt really at peace with it". She cannot identify any specific triggers that may have exacerbated her depression or suicidal ideations. States " sometimes I get into these states ".  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  Principal Problem: Bipolar 1 disorder (HCC) Diagnosis: Principal Problem:   Bipolar 1 disorder (HCC)  Total Time spent with patient: 20 minutes  Past Psychiatric History: See admission H&P  Past Medical History:  Past Medical History:  Diagnosis Date  . ADHD (attention deficit hyperactivity disorder)   . Bipolar 1 disorder (HCC)   . Bipolar 1 disorder (HCC)   . Migraine   . Migraine     Past Surgical History:  Procedure Laterality Date  . NO PAST SURGERIES     Family History:  Family History  Problem Relation Age of Onset  . Atrial fibrillation Mother   . Bipolar disorder Paternal Uncle    Family Psychiatric  History: See admission H&P Social History:  Social History   Substance and Sexual Activity  Alcohol Use No     Social History   Substance and Sexual Activity  Drug Use No    Social History   Socioeconomic History  . Marital status: Single    Spouse name: Not on file  . Number of children: Not on file  . Years of education: Not on file  . Highest education level: Not on file  Occupational History  . Not on file  Social Needs  . Financial resource strain: Not on file  . Food insecurity:    Worry: Not on file    Inability: Not on file  . Transportation needs:    Medical: Not on file    Non-medical: Not on file  Tobacco Use  . Smoking status: Never Smoker  . Smokeless tobacco: Never  Used  Substance and Sexual Activity  . Alcohol use: No  . Drug use: No  . Sexual activity: Not Currently  Lifestyle  . Physical activity:    Days per week: Not on file    Minutes per session: Not on file  . Stress: Not on file  Relationships  . Social connections:    Talks on phone: Not on file    Gets together: Not on file    Attends religious service: Not on file    Active member of club or organization: Not on file    Attends meetings of clubs or organizations: Not on file    Relationship status: Not on file  Other Topics Concern  . Not on file   Social History Narrative  . Not on file   Additional Social History:    History of alcohol / drug use?: No history of alcohol / drug abuse                    Sleep: Good  Appetite:  Good  Current Medications: Current Facility-Administered Medications  Medication Dose Route Frequency Provider Last Rate Last Dose  . acetaminophen (TYLENOL) tablet 650 mg  650 mg Oral Q6H PRN Oneta Rack, NP   650 mg at 02/18/19 2150  . alum & mag hydroxide-simeth (MAALOX/MYLANTA) 200-200-20 MG/5ML suspension 30 mL  30 mL Oral Q4H PRN Oneta Rack, NP      . hydrOXYzine (ATARAX/VISTARIL) tablet 25 mg  25 mg Oral Q6H PRN Nira Conn A, NP      . lamoTRIgine (LAMICTAL) tablet 150 mg  150 mg Oral BID Oneta Rack, NP   150 mg at 02/19/19 0814  . LORazepam (ATIVAN) tablet 0.5 mg  0.5 mg Oral Q6H PRN Donyel Castagnola, Rockey Situ, MD   0.5 mg at 02/18/19 1832  . lurasidone (LATUDA) tablet 20 mg  20 mg Oral Q breakfast Gailene Youkhana, Rockey Situ, MD   20 mg at 02/19/19 0814  . magnesium hydroxide (MILK OF MAGNESIA) suspension 30 mL  30 mL Oral Daily PRN Oneta Rack, NP      . neomycin-bacitracin-polymyxin (NEOSPORIN) ointment packet   Topical BID Oneta Rack, NP      . ondansetron (ZOFRAN-ODT) disintegrating tablet 4 mg  4 mg Oral Q8H PRN Oneta Rack, NP      . prazosin (MINIPRESS) capsule 2 mg  2 mg Oral QHS Nira Conn A, NP   2 mg at 02/18/19 2150  . topiramate (TOPAMAX) tablet 100 mg  100 mg Oral Daily Oneta Rack, NP   100 mg at 02/19/19 0814  . traZODone (DESYREL) tablet 50 mg  50 mg Oral QHS PRN Oneta Rack, NP   50 mg at 02/18/19 2150    Lab Results:  Results for orders placed or performed during the hospital encounter of 02/17/19 (from the past 48 hour(s))  TSH     Status: Abnormal   Collection Time: 02/18/19  6:35 AM  Result Value Ref Range   TSH 6.718 (H) 0.350 - 4.500 uIU/mL    Comment: Performed by a 3rd Generation assay with a functional sensitivity of <=0.01  uIU/mL. Performed at [facility], [address], [place], [place] [address]   T4, free     Status: Abnormal   Collection Time: 02/19/19  6:34 AM  Result Value Ref Range   Free T4 0.69 (L) 0.82 - 1.77 ng/dL    Comment: (NOTE) Biotin ingestion may interfere with  free T4 tests. If the results are inconsistent with the TSH level, previous test results, or the clinical presentation, then consider biotin interference. If needed, order repeat testing after stopping biotin. Performed at [facility], [address], [place], [place] [address]   Hemoglobin A1c     Status: None   Collection Time: 02/19/19  6:34 AM  Result Value Ref Range   Hgb A1c MFr Bld 5.6 4.8 - 5.6 %    Comment: (NOTE) Pre diabetes:          5.7%-6.4% Diabetes:              >6.4% Glycemic control for   <7.0% adults with diabetes    Mean Plasma Glucose 114.02 mg/dL    Comment: Performed at [facility], [address], [place], [place] [address]  Lipid panel     Status: Abnormal   Collection Time: 02/19/19  6:34 AM  Result Value Ref Range   Cholesterol 191 0 - 200 mg/dL   Triglycerides 644 <034 mg/dL   HDL 52 >74 mg/dL   Total CHOL/HDL Ratio 3.7 RATIO   VLDL 25 0 - 40 mg/dL   LDL Cholesterol 259 (H) 0 - 99 mg/dL    Comment:        Total Cholesterol/HDL:CHD Risk Coronary Heart Disease Risk Table                     Men   Women  1/2 Average Risk   3.4   3.3  Average Risk       5.0   4.4  2 X Average Risk   9.6   7.1  3 X Average Risk  23.4   11.0        Use the calculated Patient Ratio above and the CHD Risk Table to determine the patient's CHD Risk.        ATP III CLASSIFICATION (LDL):  <100     mg/dL   Optimal  563-875  mg/dL   Near or Above                    Optimal  130-159  mg/dL   Borderline  643-329  mg/dL   High  >518     mg/dL   Very High Performed at [facility], [address] [place], [place] [address]     Blood Alcohol level:  Lab  Results  Component Value Date   ETH <10 02/15/2019    Metabolic Disorder Labs: Lab Results  Component Value Date   HGBA1C 5.6 02/19/2019   MPG 114.02 02/19/2019   No results found for: PROLACTIN Lab Results  Component Value Date   CHOL 191 02/19/2019   TRIG 123 02/19/2019   HDL 52 02/19/2019   CHOLHDL 3.7 02/19/2019   VLDL 25 02/19/2019   LDLCALC 114 (H) 02/19/2019   LDLCALC 101 (H) 05/25/2017    Physical Findings: AIMS: Facial and Oral Movements Muscles of Facial Expression: None, normal Lips and Perioral Area: None, normal Jaw: None, normal Tongue: None, normal,Extremity Movements Upper (arms, wrists, hands, fingers): None, normal Lower (legs, knees, ankles, toes): None, normal, Trunk Movements Neck, shoulders, hips: None, normal, Overall Severity Severity of abnormal movements (highest score from questions above): None, normal Incapacitation due to abnormal movements: None, normal Patient's awareness of abnormal movements (rate only patient's report): No Awareness, Dental Status Current problems with teeth and/or dentures?: No Does patient usually wear dentures?: No  CIWA:  CIWA-Ar Total: 3 COWS:  COWS Total Score: 4  Musculoskeletal: Strength & Muscle Tone: within normal limits Gait & Station: normal Patient leans: N/A  Psychiatric Specialty Exam: Physical Exam  Nursing note and vitals reviewed. Constitutional: She is oriented to person, place, and time. She appears well-developed and well-nourished.  Cardiovascular: Normal rate.  Respiratory: Effort normal.  Neurological: She is alert and oriented to person, place, and time.    Review of Systems  Constitutional: Negative.   Respiratory: Negative.   Cardiovascular: Negative.   Psychiatric/Behavioral: Positive for depression and suicidal ideas. Negative for hallucinations, memory loss and substance abuse. The patient is not nervous/anxious and does not have insomnia.     Blood pressure 124/79, pulse (!)  105, temperature 99.4 F (37.4 C), temperature source Oral, resp. rate 16, height  (1.702 m), weight 94.8 kg, SpO2 100 %.Body mass index is 32.73 kg/m.  General Appearance: Fairly Groomed  Eye Contact:  Good  Speech:  Normal Rate  Volume:  Normal  Mood:  Depressed  Affect:  Congruent  Thought Process:  Coherent  Orientation:  Full (Time, Place, and Person)  Thought Content:  WDL  Suicidal Thoughts:  Yes.  without intent/plan Contracts for safety on the unit.  Homicidal Thoughts:  No  Memory:  Immediate;   Good Recent;   Good  Judgement:  Fair  Insight:  Fair  Psychomotor Activity:  Normal  Concentration:  Concentration: Good  Recall:  Good  Fund of Knowledge:  Good  Language:  Good  Akathisia:  No  Handed:  Right  AIMS (if indicated):     Assets:  Communication Skills Desire for Improvement Housing Physical Health  ADL's:  Intact  Cognition:  WNL  Sleep:  Number of Hours: 6.75     Treatment Plan Summary: Daily contact with patient to assess and evaluate symptoms and progress in treatment and Medication management   Continue inpatient hospitalization.  Continue Latuda 20 mg PO daily for mood Continue Lamictal 150 mg PO BID for mood Continue Minipress 2 mg PO QHS for nightmares Continue Topamax 100 mg PO daily for migraines Continue Ativan 0.5 mg PO Q6HR PRN anxiety Continue Vistaril 25 mg PO Q6HR PRN anxiety Continue trazodone 50 mg PO QHS PRN insomnia Continue Neosporin topical BID for wounds  Patient will participate in the therapeutic group milieu.  Discharge disposition in progress.   Aldean Baker, NP 02/19/2019, 11:45 AM   Agree with  NP Progress Note

## 2019-02-19 NOTE — BHH Group Notes (Signed)
Adult Psychoeducational Group Note  Date:  02/19/2019 Time:  9:22 PM  Group Topic/Focus:  Wrap-Up Group:   The focus of this group is to help patients review their daily goal of treatment and discuss progress on daily workbooks.  Participation Level:  Active  Participation Quality:  Appropriate and Attentive  Affect:  Appropriate  Cognitive:  Alert and Appropriate  Insight: Appropriate and Good  Engagement in Group:  Engaged  Modes of Intervention:  Discussion and Education  Additional Comments:  Pt attended and participated in wrap up group this evening. Pt rated their day a 7/10, even though their day started off at a 2/10, Pt was having SI and was nervous about up coming surgery. Pt peers helped ease their nervousness. Pt has ongoing goal to take it each minute at a time.   Chrisandra Netters 02/19/2019, 9:22 PM

## 2019-02-19 NOTE — Tx Team (Signed)
Interdisciplinary Treatment and Diagnostic Plan Update  02/19/2019 Time of Session:  Shelby Gallegos MRN: [id number]  Principal Diagnosis: <principal problem not specified>  Secondary Diagnoses: Active Problems:   Bipolar 1 disorder (HCC)   Current Medications:  Current Facility-Administered Medications  Medication Dose Route Frequency Provider Last Rate Last Dose  . acetaminophen (TYLENOL) tablet 650 mg  650 mg Oral Q6H PRN Derrill Center, NP   650 mg at 02/18/19 2150  . alum & mag hydroxide-simeth (MAALOX/MYLANTA) 200-200-20 MG/5ML suspension 30 mL  30 mL Oral Q4H PRN Derrill Center, NP      . hydrOXYzine (ATARAX/VISTARIL) tablet 25 mg  25 mg Oral Q6H PRN Lindon Romp A, NP      . lamoTRIgine (LAMICTAL) tablet 150 mg  150 mg Oral BID Derrill Center, NP   150 mg at 02/19/19 0814  . LORazepam (ATIVAN) tablet 0.5 mg  0.5 mg Oral Q6H PRN Cobos, Myer Peer, MD   0.5 mg at 02/18/19 1832  . lurasidone (LATUDA) tablet 20 mg  20 mg Oral Q breakfast Cobos, Myer Peer, MD   20 mg at 02/19/19 0814  . magnesium hydroxide (MILK OF MAGNESIA) suspension 30 mL  30 mL Oral Daily PRN Derrill Center, NP      . neomycin-bacitracin-polymyxin (NEOSPORIN) ointment packet   Topical BID Derrill Center, NP      . ondansetron (ZOFRAN-ODT) disintegrating tablet 4 mg  4 mg Oral Q8H PRN Derrill Center, NP      . prazosin (MINIPRESS) capsule 2 mg  2 mg Oral QHS Lindon Romp A, NP   2 mg at 02/18/19 2150  . topiramate (TOPAMAX) tablet 100 mg  100 mg Oral Daily Derrill Center, NP   100 mg at 02/19/19 0814  . traZODone (DESYREL) tablet 50 mg  50 mg Oral QHS PRN Derrill Center, NP   50 mg at 02/18/19 2150   PTA Medications: Medications Prior to Admission  Medication Sig Dispense Refill Last Dose  . ALPRAZolam (XANAX) 0.5 MG tablet Take 0.5 mg by mouth 2 (two) times daily as needed for anxiety.    02/14/2019  . amphetamine-dextroamphetamine (ADDERALL) 20 MG tablet Take 20 mg by mouth 3 (three) times daily.     02/14/2019  . Erenumab-aooe (AIMOVIG, 140 MG DOSE,) 70 MG/ML SOAJ Inject 70 mg into the skin every 30 (thirty) days.   unk  . lamoTRIgine (LAMICTAL) 150 MG tablet Take 150 mg by mouth 2 (two) times daily.   02/14/2019  . prazosin (MINIPRESS) 2 MG capsule Take 2-4 mg by mouth at bedtime.   unk  . SUMAtriptan (IMITREX) 50 MG tablet Take 50 mg by mouth every 2 (two) hours as needed for migraine. May repeat in 2 hours if headache persists or recurs.   unk  . 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   Physician Treatment Plan for Primary Diagnosis: <principal problem not specified> Long Term Goal(s): Improvement in symptoms so as ready for discharge Improvement in symptoms so as ready for discharge   Short Term Goals: Ability to identify changes in lifestyle to reduce recurrence of condition will improve Ability to verbalize feelings will improve Ability to disclose and discuss suicidal ideas Ability to demonstrate self-control will  improve Ability to identify and develop effective coping behaviors will improve Ability to maintain clinical measurements within normal limits will improve Ability to identify changes in lifestyle to reduce recurrence of condition will improve Ability to verbalize feelings will improve Ability to disclose and discuss suicidal ideas Ability to demonstrate self-control will improve Ability to identify and develop effective coping behaviors will improve Ability to maintain clinical measurements within normal limits will improve  Medication Management: Evaluate patient's response, side effects, and tolerance of medication  regimen.  Therapeutic Interventions: 1 to 1 sessions, Unit Group sessions and Medication administration.  Evaluation of Outcomes: Not Met  Physician Treatment Plan for Secondary Diagnosis: Active Problems:   Bipolar 1 disorder ([place])  Long Term Goal(s): Improvement in symptoms so as ready for discharge Improvement in symptoms so as ready for discharge   Short Term Goals: Ability to identify changes in lifestyle to reduce recurrence of condition will improve Ability to verbalize feelings will improve Ability to disclose and discuss suicidal ideas Ability to demonstrate self-control will improve Ability to identify and develop effective coping behaviors will improve Ability to maintain clinical measurements within normal limits will improve Ability to identify changes in lifestyle to reduce recurrence of condition will improve Ability to verbalize feelings will improve Ability to disclose and discuss suicidal ideas Ability to demonstrate self-control will improve Ability to identify and develop effective coping behaviors will improve Ability to maintain clinical measurements within normal limits will improve     Medication Management: Evaluate patient's response, side effects, and tolerance of medication regimen.  Therapeutic Interventions: 1 to 1 sessions, Unit Group sessions and Medication administration.  Evaluation of Outcomes: Not Met   RN Treatment Plan for Primary Diagnosis: <principal problem not specified> Long Term Goal(s): Knowledge of disease and therapeutic regimen to maintain health will improve  Short Term Goals: Ability to participate in decision making will improve, Ability to verbalize feelings will improve, Ability to disclose and discuss suicidal ideas, Ability to identify and develop effective coping behaviors will improve and Compliance with prescribed medications will improve  Medication Management: RN will administer medications as ordered by provider, will  assess and evaluate patient's response and provide education to patient for prescribed medication. RN will report any adverse and/or side effects to prescribing provider.  Therapeutic Interventions: 1 on 1 counseling sessions, Psychoeducation, Medication administration, Evaluate responses to treatment, Monitor vital signs and CBGs as ordered, Perform/monitor CIWA, COWS, AIMS and Fall Risk screenings as ordered, Perform wound care treatments as ordered.  Evaluation of Outcomes: Not Met   LCSW Treatment Plan for Primary Diagnosis: <principal problem not specified> Long Term Goal(s): Safe transition to appropriate next level of care at discharge, Engage patient in therapeutic group addressing interpersonal concerns.  Short Term Goals: Engage patient in aftercare planning with referrals and resources  Therapeutic Interventions: Assess for all discharge needs, 1 to 1 time with Social worker, Explore available resources and support systems, Assess for adequacy in community support network, Educate family and significant other(s) on suicide prevention, Complete Psychosocial Assessment, Interpersonal group therapy.  Evaluation of Outcomes: Not Met   Progress in Treatment: Attending groups: No. Participating in groups: No. Taking medication as prescribed: Yes. Toleration medication: Yes. Family/Significant other contact made: No, will contact:  the patient's sister Patient understands diagnosis: Yes. Discussing patient identified problems/goals with staff: Yes. Medical problems stabilized or resolved: Yes. Denies suicidal/homicidal ideation: No. Passive SI Issues/concerns per patient self-inventory: No. Other:   New problem(s) identified: None   New Short Term/Long Term  Goal(s):medication stabilization, elimination of SI thoughts, development of comprehensive mental wellness plan.    Patient Goals:  Healthy boundaries and how to deesclate  Discharge Plan or Barriers: CSW will continue to  assess for appropriate referrals and possible discharge planning.   Reason for Continuation of Hospitalization: Anxiety Depression Medication stabilization Suicidal ideation  Estimated Length of Stay: 02/23/2019  Attendees: Patient: 02/19/2019 11:21 AM  Physician: Dr. Neita Garnet, MD 02/19/2019 11:21 AM  Nursing: Yetta Flock.Lenna Sciara RN 02/19/2019 11:21 AM  RN Care Manager: 02/19/2019 11:21 AM  Social Worker: Radonna Ricker, [place] 02/19/2019 11:21 AM  Recreational Therapist:  02/19/2019 11:21 AM  Other:  02/19/2019 11:21 AM  Other:  02/19/2019 11:21 AM  Other: 02/19/2019 11:21 AM    Scribe for Treatment Team: Marylee Floras, [place] 02/19/2019 11:21 AM

## 2019-02-19 NOTE — BH Assessment (Signed)
Patient scheduled for surgery on Tuesday, 02/20/2019. NPO after midnight, daily medications with sip of water permitted. Please arrive to [facility] [address] [place]. [place] at 1100. Dr. [name]. Procedure to repair tendon to begin approximately 1300. Joaquin Courts RN [phone] with [facility]. Eustaquio Maize RN with [facility] [phone]. Patient will consented upon arrival to surgery center. BH MHT to escort patient and remain with patient throughout procedure.

## 2019-02-20 ENCOUNTER — Inpatient Hospital Stay (HOSPITAL_BASED_OUTPATIENT_CLINIC_OR_DEPARTMENT_OTHER)
Admission: RE | Admit: 2019-02-20 | Discharge: 2019-02-26 | DRG: 885 | Disposition: A | Discharge status: Home / Self Care | Payer: BLUE CROSS/BLUE SHIELD | Attending: Psychiatry | Admitting: Psychiatry

## 2019-02-20 ENCOUNTER — Other Ambulatory Visit: Payer: Self-pay

## 2019-02-20 ENCOUNTER — Encounter (HOSPITAL_BASED_OUTPATIENT_CLINIC_OR_DEPARTMENT_OTHER)
Admission: AD | Disposition: A | Discharge status: Other Acute Inpatient Hospital | Payer: Self-pay | Source: Intra-hospital | Attending: Psychiatry

## 2019-02-20 ENCOUNTER — Inpatient Hospital Stay (HOSPITAL_BASED_OUTPATIENT_CLINIC_OR_DEPARTMENT_OTHER): Payer: BLUE CROSS/BLUE SHIELD | Admitting: Certified Registered Nurse Anesthetist

## 2019-02-20 ENCOUNTER — Encounter (HOSPITAL_BASED_OUTPATIENT_CLINIC_OR_DEPARTMENT_OTHER): Payer: Self-pay

## 2019-02-20 DIAGNOSIS — G43909 Migraine, unspecified, not intractable, without status migrainosus: Secondary | ICD-10-CM | POA: Diagnosis present

## 2019-02-20 DIAGNOSIS — R450 Nervousness: Secondary | ICD-10-CM | POA: Diagnosis not present

## 2019-02-20 DIAGNOSIS — F603 Borderline personality disorder: Secondary | ICD-10-CM | POA: Diagnosis not present

## 2019-02-20 DIAGNOSIS — F909 Attention-deficit hyperactivity disorder, unspecified type: Secondary | ICD-10-CM | POA: Diagnosis present

## 2019-02-20 DIAGNOSIS — F314 Bipolar disorder, current episode depressed, severe, without psychotic features: Secondary | ICD-10-CM | POA: Diagnosis not present

## 2019-02-20 DIAGNOSIS — F4310 Post-traumatic stress disorder, unspecified: Secondary | ICD-10-CM | POA: Diagnosis not present

## 2019-02-20 DIAGNOSIS — S51811A Laceration without foreign body of right forearm, initial encounter: Secondary | ICD-10-CM | POA: Diagnosis not present

## 2019-02-20 DIAGNOSIS — F322 Major depressive disorder, single episode, severe without psychotic features: Secondary | ICD-10-CM | POA: Diagnosis present

## 2019-02-20 DIAGNOSIS — F319 Bipolar disorder, unspecified: Secondary | ICD-10-CM | POA: Diagnosis present

## 2019-02-20 DIAGNOSIS — Z79891 Long term (current) use of opiate analgesic: Secondary | ICD-10-CM

## 2019-02-20 DIAGNOSIS — E1140 Type 2 diabetes mellitus with diabetic neuropathy, unspecified: Secondary | ICD-10-CM | POA: Diagnosis present

## 2019-02-20 DIAGNOSIS — Z79899 Other long term (current) drug therapy: Secondary | ICD-10-CM | POA: Diagnosis not present

## 2019-02-20 DIAGNOSIS — Z818 Family history of other mental and behavioral disorders: Secondary | ICD-10-CM

## 2019-02-20 DIAGNOSIS — S51812A Laceration without foreign body of left forearm, initial encounter: Secondary | ICD-10-CM | POA: Diagnosis not present

## 2019-02-20 DIAGNOSIS — Z915 Personal history of self-harm: Secondary | ICD-10-CM

## 2019-02-20 DIAGNOSIS — T424X2A Poisoning by benzodiazepines, intentional self-harm, initial encounter: Secondary | ICD-10-CM | POA: Diagnosis not present

## 2019-02-20 HISTORY — PX: TENDON REPAIR: SHX5111

## 2019-02-20 HISTORY — PX: WOUND EXPLORATION: SHX6188

## 2019-02-20 HISTORY — DX: Poisoning by other drugs, medicaments and biological substances, intentional self-harm, initial encounter: T50.992A

## 2019-02-20 LAB — POCT PREGNANCY, URINE: Preg Test, Ur: NEGATIVE

## 2019-02-20 LAB — T3, FREE: T3, Free: 2.3 pg/mL (ref 2.0–4.4)

## 2019-02-20 SURGERY — WOUND EXPLORATION
Anesthesia: General | Site: Arm Lower | Laterality: Bilateral

## 2019-02-20 MED ORDER — PRAZOSIN HCL 2 MG PO CAPS
2.0000 mg | ORAL_CAPSULE | Freq: Every day | ORAL | Status: DC
  Administered 2019-02-20 – 2019-02-25 (×6): 2 mg via ORAL
  Filled 2019-02-20 (×7): qty 1

## 2019-02-20 MED ORDER — DEXAMETHASONE SODIUM PHOSPHATE 10 MG/ML IJ SOLN
INTRAMUSCULAR | Status: AC
  Filled 2019-02-20: qty 1

## 2019-02-20 MED ORDER — FENTANYL CITRATE (PF) 100 MCG/2ML IJ SOLN
50.0000 ug | INTRAMUSCULAR | Status: AC | PRN
  Administered 2019-02-20: 50 ug via INTRAVENOUS
  Administered 2019-02-20: 100 ug via INTRAVENOUS
  Administered 2019-02-20: 50 ug via INTRAVENOUS

## 2019-02-20 MED ORDER — LAMOTRIGINE 150 MG PO TABS
150.0000 mg | ORAL_TABLET | Freq: Two times a day (BID) | ORAL | Status: DC
  Administered 2019-02-20 – 2019-02-26 (×12): 150 mg via ORAL
  Filled 2019-02-20 (×14): qty 1

## 2019-02-20 MED ORDER — ONDANSETRON HCL 4 MG/2ML IJ SOLN
INTRAMUSCULAR | Status: AC
  Filled 2019-02-20: qty 2

## 2019-02-20 MED ORDER — HYDROXYZINE HCL 25 MG PO TABS
25.0000 mg | ORAL_TABLET | Freq: Four times a day (QID) | ORAL | Status: DC | PRN
  Administered 2019-02-20 – 2019-02-25 (×6): 25 mg via ORAL
  Filled 2019-02-20 (×5): qty 1

## 2019-02-20 MED ORDER — CHLORHEXIDINE GLUCONATE 4 % EX LIQD: 60.0000 mL | Freq: Once | CUTANEOUS | Status: DC

## 2019-02-20 MED ORDER — LURASIDONE HCL 20 MG PO TABS
20.0000 mg | ORAL_TABLET | Freq: Every day | ORAL | Status: DC
  Administered 2019-02-21: 20 mg via ORAL
  Filled 2019-02-20 (×3): qty 1

## 2019-02-20 MED ORDER — PHENYLEPHRINE 40 MCG/ML (10ML) SYRINGE FOR IV PUSH (FOR BLOOD PRESSURE SUPPORT)
PREFILLED_SYRINGE | INTRAVENOUS | Status: DC | PRN
  Administered 2019-02-20: 80 ug via INTRAVENOUS

## 2019-02-20 MED ORDER — MIDAZOLAM HCL 2 MG/2ML IJ SOLN
INTRAMUSCULAR | Status: AC
  Filled 2019-02-20: qty 2

## 2019-02-20 MED ORDER — ONDANSETRON HCL 4 MG/2ML IJ SOLN
INTRAMUSCULAR | Status: DC | PRN
  Administered 2019-02-20: 4 mg via INTRAVENOUS

## 2019-02-20 MED ORDER — MAGNESIUM HYDROXIDE 400 MG/5ML PO SUSP: 30.0000 mL | Freq: Every day | ORAL | Status: DC | PRN

## 2019-02-20 MED ORDER — CEFAZOLIN SODIUM-DEXTROSE 2-4 GM/100ML-% IV SOLN
2.0000 g | INTRAVENOUS | Status: AC
  Administered 2019-02-20: 2 g via INTRAVENOUS

## 2019-02-20 MED ORDER — BUPIVACAINE HCL (PF) 0.25 % IJ SOLN
INTRAMUSCULAR | Status: DC | PRN
  Administered 2019-02-20: 20 mL

## 2019-02-20 MED ORDER — MIDAZOLAM HCL 2 MG/2ML IJ SOLN
1.0000 mg | INTRAMUSCULAR | Status: DC | PRN
  Administered 2019-02-20: 2 mg via INTRAVENOUS

## 2019-02-20 MED ORDER — DEXAMETHASONE SODIUM PHOSPHATE 10 MG/ML IJ SOLN
INTRAMUSCULAR | Status: DC | PRN
  Administered 2019-02-20: 10 mg via INTRAVENOUS

## 2019-02-20 MED ORDER — LIDOCAINE 2% (20 MG/ML) 5 ML SYRINGE
INTRAMUSCULAR | Status: DC | PRN
  Administered 2019-02-20: 100 mg via INTRAVENOUS

## 2019-02-20 MED ORDER — PROPOFOL 10 MG/ML IV BOLUS
INTRAVENOUS | Status: AC
  Filled 2019-02-20: qty 20

## 2019-02-20 MED ORDER — ONDANSETRON 4 MG PO TBDP
4.0000 mg | ORAL_TABLET | Freq: Three times a day (TID) | ORAL | Status: DC | PRN
  Administered 2019-02-20: 4 mg via ORAL
  Filled 2019-02-20: qty 1

## 2019-02-20 MED ORDER — HYDROCODONE-ACETAMINOPHEN 5-325 MG PO TABS: ORAL_TABLET | ORAL | 0 refills | Status: DC

## 2019-02-20 MED ORDER — LORAZEPAM 0.5 MG PO TABS: 0.5000 mg | ORAL_TABLET | Freq: Four times a day (QID) | ORAL | Status: DC | PRN

## 2019-02-20 MED ORDER — LIDOCAINE 2% (20 MG/ML) 5 ML SYRINGE
INTRAMUSCULAR | Status: AC
  Filled 2019-02-20: qty 5

## 2019-02-20 MED ORDER — PROPOFOL 10 MG/ML IV BOLUS
INTRAVENOUS | Status: DC | PRN
  Administered 2019-02-20: 50 mg via INTRAVENOUS
  Administered 2019-02-20: 150 mg via INTRAVENOUS

## 2019-02-20 MED ORDER — FENTANYL CITRATE (PF) 100 MCG/2ML IJ SOLN
INTRAMUSCULAR | Status: AC
  Filled 2019-02-20: qty 2

## 2019-02-20 MED ORDER — FENTANYL CITRATE (PF) 100 MCG/2ML IJ SOLN
25.0000 ug | INTRAMUSCULAR | Status: DC | PRN
  Administered 2019-02-20 (×2): 25 ug via INTRAVENOUS

## 2019-02-20 MED ORDER — HYDROCODONE-ACETAMINOPHEN 5-325 MG PO TABS
1.0000 | ORAL_TABLET | Freq: Four times a day (QID) | ORAL | Status: DC | PRN
  Administered 2019-02-20 – 2019-02-21 (×3): 1 via ORAL
  Filled 2019-02-20 (×3): qty 1

## 2019-02-20 MED ORDER — SULFAMETHOXAZOLE-TRIMETHOPRIM 800-160 MG PO TABS
1.0000 | ORAL_TABLET | Freq: Two times a day (BID) | ORAL | Status: DC
  Administered 2019-02-20 – 2019-02-26 (×12): 1 via ORAL
  Filled 2019-02-20 (×15): qty 1

## 2019-02-20 MED ORDER — SULFAMETHOXAZOLE-TRIMETHOPRIM 800-160 MG PO TABS: 1.0000 | ORAL_TABLET | Freq: Two times a day (BID) | ORAL | 0 refills | Status: DC

## 2019-02-20 MED ORDER — 0.9 % SODIUM CHLORIDE (POUR BTL) OPTIME
TOPICAL | Status: DC | PRN
  Administered 2019-02-20: 300 mL

## 2019-02-20 MED ORDER — TRAZODONE HCL 50 MG PO TABS
50.0000 mg | ORAL_TABLET | Freq: Every evening | ORAL | Status: DC | PRN
  Administered 2019-02-20 – 2019-02-23 (×4): 50 mg via ORAL
  Filled 2019-02-20 (×3): qty 1

## 2019-02-20 MED ORDER — CEFAZOLIN SODIUM-DEXTROSE 2-4 GM/100ML-% IV SOLN
INTRAVENOUS | Status: AC
  Filled 2019-02-20: qty 100

## 2019-02-20 MED ORDER — LACTATED RINGERS IV SOLN
INTRAVENOUS | Status: DC
  Administered 2019-02-20 (×2): via INTRAVENOUS

## 2019-02-20 MED ORDER — ALUM & MAG HYDROXIDE-SIMETH 200-200-20 MG/5ML PO SUSP: 30.0000 mL | ORAL | Status: DC | PRN

## 2019-02-20 MED ORDER — OXYCODONE HCL 5 MG PO TABS
5.0000 mg | ORAL_TABLET | Freq: Once | ORAL | Status: AC | PRN
  Administered 2019-02-20: 5 mg via ORAL

## 2019-02-20 MED ORDER — TOPIRAMATE 100 MG PO TABS
100.0000 mg | ORAL_TABLET | Freq: Every day | ORAL | Status: DC
  Administered 2019-02-20 – 2019-02-26 (×7): 100 mg via ORAL
  Filled 2019-02-20 (×8): qty 1

## 2019-02-20 MED ORDER — OXYCODONE HCL 5 MG PO TABS
ORAL_TABLET | ORAL | Status: AC
  Filled 2019-02-20: qty 1

## 2019-02-20 MED ORDER — SCOPOLAMINE 1 MG/3DAYS TD PT72: 1.0000 | MEDICATED_PATCH | Freq: Once | TRANSDERMAL | Status: DC | PRN

## 2019-02-20 SURGICAL SUPPLY — 88 items
BAG DECANTER FOR FLEXI CONT (MISCELLANEOUS) IMPLANT
BANDAGE ACE 3X5.8 VEL STRL LF (GAUZE/BANDAGES/DRESSINGS) ×6 IMPLANT
BLADE MINI RND TIP GREEN BEAV (BLADE) IMPLANT
BLADE SURG 15 STRL LF DISP TIS (BLADE) ×4 IMPLANT
BLADE SURG 15 STRL SS (BLADE) ×8
BNDG CONFORM 2 STRL LF (GAUZE/BANDAGES/DRESSINGS) IMPLANT
BNDG ELASTIC 2X5.8 VLCR STR LF (GAUZE/BANDAGES/DRESSINGS) IMPLANT
BNDG ESMARK 4X9 LF (GAUZE/BANDAGES/DRESSINGS) ×3 IMPLANT
BNDG GAUZE ELAST 4 BULKY (GAUZE/BANDAGES/DRESSINGS) ×3 IMPLANT
CATH ROBINSON RED A/P 10FR (CATHETERS) IMPLANT
CHLORAPREP W/TINT 26 (MISCELLANEOUS) ×3 IMPLANT
CORD BIPOLAR FORCEPS 12FT (ELECTRODE) ×3 IMPLANT
COTTONBALL LRG STERILE PKG (GAUZE/BANDAGES/DRESSINGS) IMPLANT
COVER BACK TABLE 60X90IN (DRAPES) ×3 IMPLANT
COVER MAYO STAND STRL (DRAPES) ×6 IMPLANT
COVER WAND RF STERILE (DRAPES) IMPLANT
CUFF TOURN SGL QUICK 18X4 (TOURNIQUET CUFF) ×6 IMPLANT
DECANTER SPIKE VIAL GLASS SM (MISCELLANEOUS) ×3 IMPLANT
DRAPE EXTREMITY T 121X128X90 (DISPOSABLE) ×6 IMPLANT
DRAPE OEC MINIVIEW 54X84 (DRAPES) IMPLANT
DRAPE SURG 17X23 STRL (DRAPES) ×6 IMPLANT
DRSG PAD ABDOMINAL 8X10 ST (GAUZE/BANDAGES/DRESSINGS) IMPLANT
GAUZE 4X4 16PLY RFD (DISPOSABLE) IMPLANT
GAUZE SPONGE 4X4 12PLY STRL (GAUZE/BANDAGES/DRESSINGS) ×6 IMPLANT
GAUZE XEROFORM 1X8 LF (GAUZE/BANDAGES/DRESSINGS) ×9 IMPLANT
GLOVE BIO SURGEON STRL SZ7.5 (GLOVE) ×6 IMPLANT
GLOVE BIOGEL PI IND STRL 7.0 (GLOVE) ×2 IMPLANT
GLOVE BIOGEL PI IND STRL 8 (GLOVE) ×2 IMPLANT
GLOVE BIOGEL PI IND STRL 8.5 (GLOVE) ×1 IMPLANT
GLOVE BIOGEL PI INDICATOR 7.0 (GLOVE) ×4
GLOVE BIOGEL PI INDICATOR 8 (GLOVE) ×4
GLOVE BIOGEL PI INDICATOR 8.5 (GLOVE) ×2
GLOVE ECLIPSE 6.5 STRL STRAW (GLOVE) ×3 IMPLANT
GLOVE SURG ORTHO 8.0 STRL STRW (GLOVE) ×3 IMPLANT
GOWN STRL REUS W/ TWL LRG LVL3 (GOWN DISPOSABLE) ×1 IMPLANT
GOWN STRL REUS W/TWL LRG LVL3 (GOWN DISPOSABLE) ×2
GOWN STRL REUS W/TWL XL LVL3 (GOWN DISPOSABLE) ×6 IMPLANT
K-WIRE .035X4 (WIRE) IMPLANT
LOOP VESSEL MAXI BLUE (MISCELLANEOUS) IMPLANT
NDL SAFETY ECLIPSE 18X1.5 (NEEDLE) IMPLANT
NEEDLE HYPO 18GX1.5 SHARP (NEEDLE)
NEEDLE HYPO 25X1 1.5 SAFETY (NEEDLE) ×3 IMPLANT
NEEDLE KEITH (NEEDLE) IMPLANT
NS IRRIG 1000ML POUR BTL (IV SOLUTION) ×3 IMPLANT
PACK BASIN DAY SURGERY FS (CUSTOM PROCEDURE TRAY) ×3 IMPLANT
PAD CAST 3X4 CTTN HI CHSV (CAST SUPPLIES) ×2 IMPLANT
PAD CAST 4YDX4 CTTN HI CHSV (CAST SUPPLIES) IMPLANT
PADDING CAST ABS 3INX4YD NS (CAST SUPPLIES)
PADDING CAST ABS 4INX4YD NS (CAST SUPPLIES) ×4
PADDING CAST ABS COTTON 3X4 (CAST SUPPLIES) IMPLANT
PADDING CAST ABS COTTON 4X4 ST (CAST SUPPLIES) ×2 IMPLANT
PADDING CAST COTTON 3X4 STRL (CAST SUPPLIES) ×4
PADDING CAST COTTON 4X4 STRL (CAST SUPPLIES)
SLEEVE SCD COMPRESS KNEE MED (MISCELLANEOUS) ×3 IMPLANT
SPEAR EYE SURG WECK-CEL (MISCELLANEOUS) ×3 IMPLANT
SPLINT PLASTER CAST XFAST 3X15 (CAST SUPPLIES) ×30 IMPLANT
SPLINT PLASTER XTRA FASTSET 3X (CAST SUPPLIES) ×60
STOCKINETTE 4X48 STRL (DRAPES) ×6 IMPLANT
SUT CHROMIC 5 0 P 3 (SUTURE) IMPLANT
SUT ETHIBOND 3-0 V-5 (SUTURE) IMPLANT
SUT ETHILON 3 0 PS 1 (SUTURE) ×6 IMPLANT
SUT ETHILON 4 0 PS 2 18 (SUTURE) ×15 IMPLANT
SUT FIBERWIRE 3-0 18 TAPR NDL (SUTURE) ×3
SUT FIBERWIRE 4-0 18 DIAM BLUE (SUTURE)
SUT FIBERWIRE 4-0 18 TAPR NDL (SUTURE)
SUT MERSILENE 2.0 SH NDLE (SUTURE) IMPLANT
SUT MERSILENE 4 0 P 3 (SUTURE) IMPLANT
SUT MNCRL AB 4-0 PS2 18 (SUTURE) IMPLANT
SUT MON AB 5-0 PS2 18 (SUTURE) IMPLANT
SUT NYLON 9 0 VRM6 (SUTURE) IMPLANT
SUT PROLENE 2 0 SH DA (SUTURE) IMPLANT
SUT PROLENE 6 0 P 1 18 (SUTURE) IMPLANT
SUT SILK 2 0 PERMA HAND 18 BK (SUTURE) IMPLANT
SUT SILK 4 0 PS 2 (SUTURE) IMPLANT
SUT SUPRAMID 4-0 (SUTURE) IMPLANT
SUT VIC AB 3-0 PS1 18 (SUTURE)
SUT VIC AB 3-0 PS1 18XBRD (SUTURE) IMPLANT
SUT VIC AB 4-0 P-3 18XBRD (SUTURE) IMPLANT
SUT VIC AB 4-0 P3 18 (SUTURE)
SUT VICRYL 4-0 PS2 18IN ABS (SUTURE) ×3 IMPLANT
SUTURE FIBERWR 3-0 18 TAPR NDL (SUTURE) ×1 IMPLANT
SUTURE FIBERWR 4-0 18 DIA BLUE (SUTURE) IMPLANT
SUTURE FIBERWR 4-0 18 TAPR NDL (SUTURE) IMPLANT
SYR BULB 3OZ (MISCELLANEOUS) ×3 IMPLANT
SYR CONTROL 10ML LL (SYRINGE) ×3 IMPLANT
TOWEL GREEN STERILE FF (TOWEL DISPOSABLE) ×6 IMPLANT
TUBE FEEDING ENTERAL 5FR 16IN (TUBING) IMPLANT
UNDERPAD 30X30 (UNDERPADS AND DIAPERS) ×6 IMPLANT

## 2019-02-20 NOTE — Anesthesia Procedure Notes (Signed)
Procedure Name: LMA Insertion Date/Time: 02/20/2019 1:32 PM Performed by: Gar Gibbon, CRNA Pre-anesthesia Checklist: Patient identified, Emergency Drugs available, Suction available and Patient being monitored Patient Re-evaluated:Patient Re-evaluated prior to induction Oxygen Delivery Method: Circle system utilized Preoxygenation: Pre-oxygenation with 100% oxygen Induction Type: IV induction Ventilation: Mask ventilation without difficulty LMA: LMA inserted LMA Size: 4.0 Number of attempts: 1 Airway Equipment and Method: Bite block Placement Confirmation: positive ETCO2 Tube secured with: Tape Dental Injury: Teeth and Oropharynx as per pre-operative assessment

## 2019-02-20 NOTE — H&P (Signed)
Shelby Gallegos is an 38 y.o. female.   Chief Complaint: bilateral forearm lacerations HPI: 38 yo female with bilateral forearm lacerations.  Seen in ED where wounds cleaned and closed 5 days ago.  She wishes to proceed with operative exploration.  Allergies: No Known Allergies  Past Medical History:  Diagnosis Date  . ADHD (attention deficit hyperactivity disorder)   . Bipolar 1 disorder (HCC)   . Migraine   . Suicide attempt by drug overdose (HCC) 02/15/2019    Past Surgical History:  Procedure Laterality Date  . NO PAST SURGERIES      Family History: Family History  Problem Relation Age of Onset  . Atrial fibrillation Mother   . Bipolar disorder Paternal Uncle     Social History:   reports that she has never smoked. She has never used smokeless tobacco. She reports that she does not drink alcohol or use drugs.  Medications: Medications Prior to Admission  Medication Sig Dispense Refill  . ALPRAZolam (XANAX) 0.5 MG tablet Take 0.5 mg by mouth 2 (two) times daily as needed for anxiety.     Marland Kitchen amphetamine-dextroamphetamine (ADDERALL) 20 MG tablet Take 20 mg by mouth 3 (three) times daily.     Dorise Hiss (AIMOVIG, 140 MG DOSE,) 70 MG/ML SOAJ Inject 70 mg into the skin every 30 (thirty) days.    Marland Kitchen lamoTRIgine (LAMICTAL) 150 MG tablet Take 150 mg by mouth 2 (two) times daily.    . prazosin (MINIPRESS) 2 MG capsule Take 2-4 mg by mouth at bedtime.    . SUMAtriptan (IMITREX) 50 MG tablet Take 50 mg by mouth every 2 (two) hours as needed for migraine. May repeat in 2 hours if headache persists or recurs.    . topiramate (TOPAMAX) 100 MG tablet       Results for orders placed or performed during the hospital encounter of 02/17/19 (from the past 48 hour(s))  T3, free     Status: None   Collection Time: 02/19/19  6:34 AM  Result Value Ref Range   T3, Free 2.3 2.0 - 4.4 pg/mL    Comment: (NOTE) Performed At: [facility] [address] [place], [place]  [id number] Jolene Schimke MD [id number]   T4, free     Status: Abnormal   Collection Time: 02/19/19  6:34 AM  Result Value Ref Range   Free T4 0.69 (L) 0.82 - 1.77 ng/dL    Comment: (NOTE) Biotin ingestion may interfere with free T4 tests. If the results are inconsistent with the TSH level, previous test results, or the clinical presentation, then consider biotin interference. If needed, order repeat testing after stopping biotin. Performed at [facility], [address], [place], [place] [address]   Hemoglobin A1c     Status: None   Collection Time: 02/19/19  6:34 AM  Result Value Ref Range   Hgb A1c MFr Bld 5.6 4.8 - 5.6 %    Comment: (NOTE) Pre diabetes:          5.7%-6.4% Diabetes:              >6.4% Glycemic control for   <7.0% adults with diabetes    Mean Plasma Glucose 114.02 mg/dL    Comment: Performed at [facility], [address] [place], [place] [address]  Lipid panel     Status: Abnormal   Collection Time: 02/19/19  6:34 AM  Result Value Ref Range   Cholesterol 191 0 - 200 mg/dL   Triglycerides 544 <920  mg/dL   HDL 52 >57 mg/dL   Total CHOL/HDL Ratio 3.7 RATIO   VLDL 25 0 - 40 mg/dL   LDL Cholesterol 262 (H) 0 - 99 mg/dL    Comment:        Total Cholesterol/HDL:CHD Risk Coronary Heart Disease Risk Table                     Men   Women  1/2 Average Risk   3.4   3.3  Average Risk       5.0   4.4  2 X Average Risk   9.6   7.1  3 X Average Risk  23.4   11.0        Use the calculated Patient Ratio above and the CHD Risk Table to determine the patient's CHD Risk.        ATP III CLASSIFICATION (LDL):  <100     mg/dL   Optimal  035-597  mg/dL   Near or Above                    Optimal  130-159  mg/dL   Borderline  416-384  mg/dL   High  >536     mg/dL   Very High Performed at [facility], [address] [place], [place] [address]     No results found.   A comprehensive review of systems was negative.  Blood  pressure 134/77, pulse (!) 112, temperature 100.1 F (37.8 C), temperature source Oral, resp. rate 16, height 5\' 7"  (1.702 m), weight 94.8 kg, SpO2 100 %.  General appearance: alert, cooperative and appears stated age Head: Normocephalic, without obvious abnormality, atraumatic Neck: supple, symmetrical, trachea midline Cardio: regular rate and rhythm Resp: clear to auscultation bilaterally Extremities: Intact sensation and capillary refill all digits.  +epl/fpl/io.  Multiple stapled lacerations to bilateral volar forearms. Pulses: 2+ and symmetric Skin: Skin color, texture, turgor normal. No rashes or lesions Neurologic: Grossly normal Incision/Wound: as above  Assessment/Plan Bilateral forearm lacerations.  Plan operative exploration with repair tendon/artery/nerve as necessary.  Risks, benefits and alternatives of surgery were discussed including risks of blood loss, infection, damage to nerves/vessels/tendons/ligament/bone, failure of surgery, need for additional surgery, complication with wound healing, nonunion, malunion, stiffness.  She voiced understanding of these risks and elected to proceed.    Shelby Gallegos 02/20/2019, 11:52 AM

## 2019-02-20 NOTE — Progress Notes (Signed)
Patient was seen to explain to her the reason for surgery & what might be done during surgery.  Preoperative instructions given & medical history asked.  Patient is NPO except for Tylenol during the night for pain.  Patient said that she felt a lot better after we talked & most of her anxiety was relieved.  Pain presently on left arm is 0-3 & feels a slight pull when making a fist.  Some of the postoperative instructions given & verbalized understanding of them.  [facility] at 11:30 for a 1:00 procedure.

## 2019-02-20 NOTE — Op Note (Signed)
I assisted Surgeon(s) and Role:    * [name], MD - Primary    Cindee Salt, MD - Assisting on the Procedure(s): WOUND EXPLORATION TENDON REPAIR RIGHT FOREARM on 02/20/2019.  I provided assistance on this case as follows: setup, exploration of multi[ple wounds on both arms, with exploration of arteries and nerves and flexor tendons on each arm, closure of the wounds and application of the dressings and splints..  Electronically signed by: Cindee Salt, MD Date: 02/20/2019 Time: 3:18 PM

## 2019-02-20 NOTE — Discharge Instructions (Addendum)

## 2019-02-20 NOTE — Transfer of Care (Signed)
Immediate Anesthesia Transfer of Care Note  Patient: Shelby Gallegos  Procedure(s) Performed: WOUND EXPLORATION (Bilateral Arm Lower) TENDON REPAIR RIGHT FOREARM (Bilateral Arm Lower)  Patient Location: PACU  Anesthesia Type:General  Level of Consciousness: awake, sedated and patient cooperative  Airway & Oxygen Therapy: Patient Spontanous Breathing and Patient connected to face mask oxygen  Post-op Assessment: Report given to RN and Post -op Vital signs reviewed and stable  Post vital signs: Reviewed and stable  Last Vitals:  Vitals Value Taken Time  BP    Temp    Pulse 81 02/20/2019  3:21 PM  Resp 11 02/20/2019  3:21 PM  SpO2 98 % 02/20/2019  3:21 PM  Vitals shown include unvalidated device data.  Last Pain:  Vitals:   02/20/19 1233  TempSrc: Oral  PainSc: 4       Patients Stated Pain Goal: 0 (02/19/19 1100)  Complications: No apparent anesthesia complications

## 2019-02-20 NOTE — Plan of Care (Signed)
D: Patient is in the hallway on approach asking for something to help with pain. Her last pain medication had been given less than 2 hours prior. Patient is alert and cooperative. Denies SI, HI, AVH, and verbally contracts for safety. Patient reports pain 9/10 in lacerations on forearms. Patient eye contact is very brief ad she looks at the ground. She is in pain and feeling very depressed.    A: Medications administered per MD order. Support provided. Patient educated on safety on the unit and medications. Education on pain management provided. Routine safety checks every 15 minutes. Patient stated understanding to tell nurse about any new physical symptoms. Patient understands to tell staff of any needs.     R: No adverse drug reactions noted. Patient verbally contracts for safety. Patient remains safe at this time and will continue to monitor.   Problem: Education: Goal: Knowledge of  General Education information/materials will improve Outcome: Progressing   Problem: Safety: Goal: Periods of time without injury will increase Outcome: Progressing   Patient oriented to the unit. Patient remains safe and will continue to monitor.

## 2019-02-20 NOTE — Op Note (Signed)
NAME: Shelby Gallegos MEDICAL RECORD NO: [id number] DATE OF BIRTH: 1981-01-29 FACILITY: [facility] LOCATION: [facility] PHYSICIAN: Tami Ribas, MD   OPERATIVE REPORT   DATE OF PROCEDURE: 02/20/19    PREOPERATIVE DIAGNOSIS:   Multiple bilateral volar forearm lacerations   POSTOPERATIVE DIAGNOSIS:   Multiple bilateral volar forearm lacerations   PROCEDURE:   1.  Exploration right forearm lacerations with repair of palmaris longus tendon and distal forearm 2.  Exploration of left forearm lacerations with repair of partial FCU tendon laceration at wrist   SURGEON:  [name], M.D.   ASSISTANT: Cindee Salt, MD   ANESTHESIA:  General   INTRAVENOUS FLUIDS:  Per anesthesia flow sheet.   ESTIMATED BLOOD LOSS:  Minimal.   COMPLICATIONS:  None.   SPECIMENS:  none   TOURNIQUET TIME:    Total Tourniquet Time Documented: Upper Arm (Right) - 20 minutes Total: Upper Arm (Right) - 20 minutes  Upper Arm (Left) - 45 minutes Total: Upper Arm (Left) - 45 minutes    DISPOSITION:  Stable to PACU.   INDICATIONS: 38 year old female with multiple bilateral volar forearm lacerations.  She was seen in the emergency department 5 days ago where the wounds were cleaned and stapled.  She presents today for exploration of wounds with repair of tendon artery nerve is necessary. Risks, benefits and alternatives of surgery were discussed including the risks of blood loss, infection, damage to nerves, vessels, tendons, ligaments, bone for surgery, need for additional surgery, complications with wound healing, continued pain, stiffness.  She voiced understanding of these risks and elected to proceed.  OPERATIVE COURSE:  After being identified preoperatively by myself,  the patient and I agreed on the procedure and site of the procedure.  The surgical site was marked.  Surgical consent had been signed. She was given IV antibiotics as preoperative antibiotic prophylaxis. She was transferred to  the operating room and placed on the operating table in supine position with the right upper extremity on an arm board.  General anesthesia was induced by the anesthesiologist.  Right upper extremity was prepped and draped in normal sterile orthopedic fashion.  A surgical pause was performed between the surgeons, anesthesia, and operating room staff and all were in agreement as to the patient, procedure, and site of procedure.  Tourniquet at the proximal aspect of the extremity was inflated to 250 mmHg after exsanguination of the arm with an Esmarch bandage.    Staples were removed and the wounds opened.  There were 2 major wounds one longitudinally in the midportion of the forearm and one transverse at the wrist.  The longitudinal form laceration was explored first.  This was into the subcutaneous tissues.  The fascia was not violated.  In the more distal transverse wound at the wrist there was a laceration of the palmaris longus tendon.  The fascia was mostly intact.  The median nerve was identified and was intact.  The palmar cutaneous branch of the median nerve was identified and was intact.  Radial artery and FCR tendon were identified and was intact.  FCU was outside the zone of injury.  Palmaris longus tendon stump was retrieved from proximally.  A 3-0 FiberWire suture was used in a modified Kessler technique and oversewn with a horizontal mattress suture.  This provided good reapproximation of the tendon ends.  The wounds were then closed with 4-0 and 5-0 nylon suture in a a mattress fashion.  The wounds were injected with quarter percent plain Marcaine to  aid in postoperative analgesia.  Wounds were dressed with sterile Xeroform 4 x 4's and wrapped with a Kerlix and Ace bandage.  A dorsal blocking splint was later placed with the wrist approximately 30 degrees of flexion.  The tourniquet was deflated at 20 minutes.  Fingertips were pink with brisk capillary refill after deflation of tourniquet.  The  operative  drapes were broken down.    The left upper extremity was then prepped and draped in normal sterile orthopedic fashion a surgical pause was again performed between surgeons anesthesia operative staff and our agreement as patient procedure and site of seizure.  Staples were again removed.  There are multiple lacerations.  There is a more longitudinal laceration proximally in the forearm which went into the subcutaneous tissues.  The fascia was not violated.  There were multiple transverse lacerations at the wrist and at the radial side of the wrist.  These were all opened and explored.  There was a partial laceration of the FCU tendon.  The ulnar nerve and artery were defined and were intact.  The fascia was intact and the wounds more proximally in the forearm.  Some of these lacerations had caused a square of skin and subcutaneous tissues to be isolated.  The skin still had apparent perfusion as it was not darkening.  There were 3 lacerations transversely at the radial side of the wrist.  These were explored.  There is a small nick in the APL tendon which was not in need of repair.  No other tendon lacerations noted.  The superficial sensory branch of the radial nerve was identified and was intact.  The radial artery was identified and was intact.  All wounds were copiously irrigated with sterile saline.  The 3-0 FiberWire suture was used in a figure-of-eight fashion to reapproximate the partial FCU tendon laceration.  Good reapproximation of tendon ends was achieved.  The wounds were again copiously irrigated with sterile saline.  There were then closed with 4-0 and 3-0 nylon in a horizontal mattress fashion.  The wounds were injected with quarter percent plain Marcaine to aid in postoperative analgesia.  There were dressed with sterile Xeroform 4 x 4's and wrapped with a Kerlix bandage.  A dorsal blocking splint was placed with the wrist approximately 30 degrees of flexion.  This was wrapped with Kerlix  and Ace bandage.  Tourniquet was deflated at 45 minutes.  Fingertips were pink with brisk capillary refill after deflation of the tourniquet.  The operative drapes were broken down.  The patient was awoken from anesthesia safely.  She was transferred back to the stretcher and taken to PACU in stable condition.  I will see her back in the office in 1 week for postoperative followup.  I will give her a prescription for Norco 5/325 1-2 tabs PO q6 hours prn pain, dispense # 20 and Bactrim DS 1 p.o. twice daily x7 days.  This was written on a paper prescription as she is in an inpatient facility.   [name], MD Electronically signed, 02/20/19

## 2019-02-20 NOTE — Plan of Care (Signed)
D: Patient is alert, oriented, pleasant, and cooperative. Denies SI, HI, AVH, and verbally contracts for safety. Patient reports her day started off rough but as gotten better as it has gone by. Patient reports pain 5/10 in lacerations on forearms (especially near left wrist) and 3 hours later pain that worsened to 8/10 (may have rolled on it while sleeping).    A: Medications administered per MD order. Support provided. Patient educated on safety on the unit and medications. Routine safety checks every 15 minutes. Patient stated understanding to tell nurse about any new physical symptoms. Patient understands to tell staff of any needs.     R: No adverse drug reactions noted. Patient verbally contracts for safety. Patient remains safe at this time and will continue to monitor.   Problem: Education: Goal: Mental status will improve Outcome: Progressing   Patient denies SI, HI, AVH, and contracts for safety. Patient remains safe and will continue to monitor.

## 2019-02-20 NOTE — Anesthesia Preprocedure Evaluation (Addendum)
Anesthesia Evaluation  Patient identified by MRN, date of birth, ID band Patient awake    Reviewed: Allergy & Precautions, NPO status , Patient's Chart, lab work & pertinent test results  Airway Mallampati: I  TM Distance: >3 FB Neck ROM: Full    Dental no notable dental hx. (+) Teeth Intact, Dental Advisory Given   Pulmonary neg pulmonary ROS,    Pulmonary exam normal breath sounds clear to auscultation       Cardiovascular negative cardio ROS Normal cardiovascular exam Rhythm:Regular Rate:Normal     Neuro/Psych  Headaches, PSYCHIATRIC DISORDERS Depression Bipolar Disorder    GI/Hepatic negative GI ROS, Neg liver ROS,   Endo/Other  negative endocrine ROS  Renal/GU negative Renal ROS  negative genitourinary   Musculoskeletal negative musculoskeletal ROS (+)   Abdominal   Peds  (+) ADHD Hematology  (+) Blood dyscrasia, anemia ,   Anesthesia Other Findings Suicide attempt 02/15/19, Xanax OD  Reproductive/Obstetrics negative OB ROS                           Anesthesia Physical Anesthesia Plan  ASA: II  Anesthesia Plan: General   Post-op Pain Management:    Induction: Intravenous  PONV Risk Score and Plan: 3 and Ondansetron, Dexamethasone and Midazolam  Airway Management Planned: LMA  Additional Equipment:   Intra-op Plan:   Post-operative Plan: Extubation in OR  Informed Consent: I have reviewed the patients History and Physical, chart, labs and discussed the procedure including the risks, benefits and alternatives for the proposed anesthesia with the patient or authorized representative who has indicated his/her understanding and acceptance.     Dental advisory given  Plan Discussed with: CRNA  Anesthesia Plan Comments:         Anesthesia Quick Evaluation

## 2019-02-20 NOTE — Progress Notes (Signed)
Dar Note: Patient returned from surgical appointment for tendon repair on both arms.  Patient is alert and oriented x 4.  Dressing to both arms intact.  Norco/Vicodin given for complain of pain.  Patient encouraged to elevate both arms while in bed or sitting.  Safety checks maintained every 15 minutes.  Patient is safe on the unit.

## 2019-02-21 ENCOUNTER — Encounter (HOSPITAL_BASED_OUTPATIENT_CLINIC_OR_DEPARTMENT_OTHER): Payer: Self-pay | Admitting: Orthopedic Surgery

## 2019-02-21 DIAGNOSIS — F319 Bipolar disorder, unspecified: Secondary | ICD-10-CM

## 2019-02-21 LAB — CULTURE, BLOOD (ROUTINE X 2)
Culture: NO GROWTH
Culture: NO GROWTH
Special Requests: ADEQUATE

## 2019-02-21 MED ORDER — HYDROCODONE-ACETAMINOPHEN 5-325 MG PO TABS
2.0000 | ORAL_TABLET | Freq: Four times a day (QID) | ORAL | Status: DC | PRN
  Administered 2019-02-21 – 2019-02-22 (×4): 2 via ORAL
  Filled 2019-02-21 (×4): qty 2

## 2019-02-21 MED ORDER — LURASIDONE HCL 40 MG PO TABS
40.0000 mg | ORAL_TABLET | Freq: Every day | ORAL | Status: DC
  Administered 2019-02-22: 40 mg via ORAL
  Filled 2019-02-21 (×2): qty 1

## 2019-02-21 MED ORDER — LURASIDONE HCL 40 MG PO TABS: 40.0000 mg | ORAL_TABLET | Freq: Every day | ORAL | 0 refills | Status: DC

## 2019-02-21 NOTE — Discharge Summary (Deleted)
  The note originally documented on this encounter has been moved the the encounter in which it belongs.  

## 2019-02-21 NOTE — Discharge Summary (Signed)
Physician Discharge Summary Note  Patient:  Shelby Gallegos is an 38 y.o., female MRN:  [id number] DOB:  10/07/1981 Patient phone:  [phone] (home)  Patient address:   [address] [place] [address],  Total Time spent with patient: 15 minutes  Date of Admission:  02/17/2019 Date of Discharge: 02/20/2019  Reason for Admission:  Suicide attempt via cutting, overdose on Xanax and Nyquil  Principal Problem: Bipolar I disorder [facility]) Discharge Diagnoses: Principal Problem:   Bipolar I disorder (HCC) Active Problems:   MDD (major depressive disorder), severe (HCC)   Past Psychiatric History: See admission H&P  Past Medical History:  Past Medical History:  Diagnosis Date  . ADHD (attention deficit hyperactivity disorder)   . Bipolar 1 disorder (HCC)   . Migraine   . Suicide attempt by drug overdose (HCC) 02/15/2019    Past Surgical History:  Procedure Laterality Date  . NO PAST SURGERIES    . TENDON REPAIR Bilateral 02/20/2019   Procedure: TENDON REPAIR BILATERAL FOREARMS;  Surgeon: [name], MD;  Location: [facility];  Service: Orthopedics;  Laterality: Bilateral;  . WOUND EXPLORATION Bilateral 02/20/2019   Procedure: WOUND EXPLORATION;  Surgeon: [name], MD;  Location: [facility];  Service: Orthopedics;  Laterality: Bilateral;   Family History:  Family History  Problem Relation Age of Onset  . Atrial fibrillation Mother   . Bipolar disorder Paternal Uncle    Family Psychiatric  History: See admission H&P Social History:  Social History   Substance and Sexual Activity  Alcohol Use No     Social History   Substance and Sexual Activity  Drug Use No    Social History   Socioeconomic History  . Marital status: Single    Spouse name: Not on file  . Number of children: Not on file  . Years of education: Not on file  . Highest education level: Not on file  Occupational History  . Not on file  Social Needs  . Financial  resource strain: Not on file  . Food insecurity:    Worry: Not on file    Inability: Not on file  . Transportation needs:    Medical: Not on file    Non-medical: Not on file  Tobacco Use  . Smoking status: Never Smoker  . Smokeless tobacco: Never Used  Substance and Sexual Activity  . Alcohol use: No  . Drug use: No  . Sexual activity: Not Currently  Lifestyle  . Physical activity:    Days per week: Not on file    Minutes per session: Not on file  . Stress: Not on file  Relationships  . Social connections:    Talks on phone: Not on file    Gets together: Not on file    Attends religious service: Not on file    Active member of club or organization: Not on file    Attends meetings of clubs or organizations: Not on file    Relationship status: Not on file  Other Topics Concern  . Not on file  Social History Narrative  . Not on file    Hospital Course:  From admission H&P: 38 year old female. Attempted suicide 3/19 by overdosing on prescribed Alprazolam  And on OTC sleeping medication ( Nyquil) . States she took " a whole bottle of Xanax). Following overdose she also cut herself on both forearms resulting in significant blood loss, needing multiple staples. States a friend contacted 911 and was brought to ED. States  suicide attempt had been been formed/planned earlier that day and that at the time " I felt really at peace with it". She cannot identify any specific triggers that may have exacerbated her depression or suicidal ideations. States " sometimes I get into these states ".  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 Endorses history of worsening depression and endorses neuro vegetative symptoms as below, denies hallucinations.  [name] was admitted after suicide attempt via cutting wrists and overdose on Xanax and Nyquil.  She left the facility on 02/20/2019 with MHT sitter to day surgery center for exploratory surgery for tendon related to recent cutting episode. She returned to [facility] 02/20/2019 for continued psychiatric treatment after surgery.  Physical Findings: AIMS: Facial and Oral Movements Muscles of Facial Expression: None, normal Lips and Perioral Area: None, normal Jaw: None, normal Tongue: None, normal,Extremity Movements Upper (arms, wrists, hands, fingers): None, normal Lower (legs, knees, ankles, toes): None, normal, Trunk Movements Neck, shoulders, hips: None, normal, Overall Severity Severity of abnormal movements (highest score from questions above): None, normal Incapacitation due to abnormal movements: None, normal Patient's awareness of abnormal movements (rate only patient's report): No Awareness, Dental Status Current problems with teeth and/or dentures?: No Does patient usually wear dentures?: No  CIWA:    COWS:     Musculoskeletal: Strength & Muscle Tone: within normal limits Gait & Station: normal Patient leans: N/A  Psychiatric Specialty Exam: Physical Exam  Nursing note and vitals reviewed. Constitutional: She is oriented to person, place, and time. She appears well-developed and well-nourished.  Cardiovascular: Normal rate.  Respiratory: Effort normal.  Neurological: She is alert and oriented to person, place, and time.    Review of Systems  Constitutional: Negative.   Psychiatric/Behavioral: Positive for depression and suicidal ideas. Negative for hallucinations, memory loss and substance abuse. The patient is nervous/anxious. The patient does not have insomnia.     Blood pressure 114/78, pulse (!) 102, temperature 98.5 F (36.9 C), temperature source Oral, resp. rate 16.There is no height or weight on file to calculate BMI.  General Appearance: Casual  Eye Contact:  Good  Speech:  Normal Rate  Volume:  Normal  Mood:  Depressed  Affect:  Congruent  Thought Process:   Coherent  Orientation:  Full (Time, Place, and Person)  Thought Content:  WDL  Suicidal Thoughts:  Yes.  without intent/plan  Homicidal Thoughts:  No  Memory:  Immediate;   Good Recent;   Good  Judgement:  Fair  Insight:  Fair  Psychomotor Activity:  Normal  Concentration:  Concentration: Good  Recall:  Fair  Fund of Knowledge:  Fair  Language:  Good  Akathisia:  No  Handed:  Right  AIMS (if indicated):     Assets:  Communication Skills Desire for Improvement Housing Resilience Social Support  ADL's:  Intact  Cognition:  WNL  Sleep:  Number of Hours: 1        Has this patient used any form of tobacco in the last 30 days? (Cigarettes, Smokeless Tobacco, Cigars, and/or Pipes)  No  Blood Alcohol level:  Lab Results  Component Value Date   ETH <10 02/15/2019    Metabolic Disorder Labs:  Lab Results  Component Value Date   HGBA1C 5.6 02/19/2019   MPG 114.02 02/19/2019   No results found for: PROLACTIN Lab Results  Component Value Date   CHOL 191 02/19/2019   TRIG 123 02/19/2019   HDL 52 02/19/2019   CHOLHDL 3.7 02/19/2019   VLDL 25 02/19/2019   LDLCALC 114 (H) 02/19/2019   LDLCALC 101 (H) 05/25/2017    See Psychiatric Specialty Exam and Suicide Risk Assessment completed by Attending Physician prior to discharge.  Discharge destination:  Other:  Day surgery, [facility]  Is patient on multiple antipsychotic therapies at discharge:  No   Has Patient had three or more failed trials of antipsychotic monotherapy by history:  No  Recommended Plan for Multiple Antipsychotic Therapies: NA   Allergies as of 02/21/2019   No Known Allergies     Medication List    STOP taking these medications   Adderall 20 MG tablet Generic drug:  amphetamine-dextroamphetamine   ALPRAZolam 0.5 MG tablet Commonly known as:  XANAX   SUMAtriptan 50 MG tablet Commonly known as:  IMITREX     TAKE these medications     Indication  Aimovig (140 MG Dose)  70 MG/ML Soaj Generic drug:  Erenumab-aooe Inject 70 mg into the skin every 30 (thirty) days.  Indication:  Migraine Headache   HYDROcodone-acetaminophen 5-325 MG tablet Commonly known as:  Norco 1-2 tabs po q6 hours prn pain  Indication:  Pain   lamoTRIgine 150 MG tablet Commonly known as:  LAMICTAL Take 150 mg by mouth 2 (two) times daily.  Indication:  Mood   lurasidone 40 MG Tabs tablet Commonly known as:  LATUDA Take 1 tablet (40 mg total) by mouth daily with breakfast. Start taking on:  February 22, 2019  Indication:  Mood   prazosin 2 MG capsule Commonly known as:  MINIPRESS Take 2-4 mg by mouth at bedtime.  Indication:  Frightening Dreams   sulfamethoxazole-trimethoprim 800-160 MG tablet Commonly known as:  Bactrim DS Take 1 tablet by mouth 2 (two) times daily.  Indication:  Infection   topiramate 100 MG tablet Commonly known as:  TOPAMAX  Indication:  Migraine Headache        Follow-up recommendations:  [facility]  Comments:  [facility]  Signed: Aldean BakerJanet E Birdena Kingma, NP 02/21/2019, 10:49 AM

## 2019-02-21 NOTE — H&P (Signed)
Psychiatric Admission Assessment Adult  Patient Identification: Avarose Rathore MRN:  [id number] Date of Evaluation:  02/21/2019 Chief Complaint:  Bipolar Principal Diagnosis: Bipolar I disorder (HCC) Diagnosis:  Principal Problem:   Bipolar I disorder (HCC) Active Problems:   MDD (major depressive disorder), severe (HCC)  History of Present Illness: From admission H&P 02/18/2019: 38 year old female. Attempted suicide 3/19 by overdosing on prescribed Alprazolam  And on OTC sleeping medication ( Nyquil) . States she took " a whole bottle of Xanax). Following overdose she also cut herself on both forearms resulting in significant blood loss, needing multiple staples. States a friend contacted 911 and was brought to ED. States suicide attempt had been been formed/planned earlier that day and that at the time " I felt really at peace with it". She cannot identify any specific triggers that may have exacerbated her depression or suicidal ideations. States " sometimes I get into these states ".  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 Endorses history of worsening depression and endorses neuro vegetative symptoms as below, denies hallucinations.  Ms. Oriordan was admitted on 02/17/2019 after suicide attempt via cutting wrists and overdose on Xanax and Nyquil. She left the facility on 02/20/2019 with MHT sitter to day surgery center for exploratory surgery for tendon related to recent cutting episode. She returned to [facility] 02/20/2019 for continued psychiatric treatment after surgery. On assessment today, she reports continuing depression but denies current SI. Nursing reports patient was having suicidal thoughts last night and patient again expressed regret that suicide attempt was not successful. Patient does acknowledge suicidal thoughts last night and says it was  largely related to severe pain in her arms after surgery. Today she reports feeling "indifferent" about being alive. She reports feeling purposeless and anxious about her future. She agrees to let staff member know if suicidal thoughts return. Denies AVH/HI.  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 Denies prior suicidal attempts, denies prior history of cutting or self injurious behaviors. Reports history of Bipolar Disorder, PTSD, ADHD diagnosis in the past. Describes episodes of increased energy, grandiose ideations, spending excessive money resulting in debt . Reports history of psychotic symptoms during episodes of mania , not otherwise. Describes history of panic attacks, does not endorse agoraphobia  Is the patient at risk to self? Yes.    Has the patient been a risk to self in the past 6 months? Yes.    Has the patient been a risk to self within the distant past? Yes.    Is the patient a risk to others? No.  Has the patient been a risk to others in the past 6 months? No.  Has the patient been a risk to others within the distant past? No.   Prior Inpatient Therapy:   Prior Outpatient Therapy:    Alcohol Screening:   Substance Abuse History in the last 12 months:  No. Consequences of Substance Abuse: NA Previous Psychotropic Medications: Yes Lamictal and Minipress currently. Reports she took Jordan in the past  and found it helpful- she cannot remember why it was discontinued. She reports taking Wellbutrin in the past and did not find it helpful, had a manic  episode while taking it. Psychological Evaluations: No  Past Medical History:  Past Medical History:  Diagnosis Date  . ADHD (attention deficit hyperactivity disorder)   . Bipolar 1 disorder (HCC)   . Migraine   . Suicide attempt by drug overdose (HCC) 02/15/2019    Past Surgical History:  Procedure Laterality Date  . NO PAST SURGERIES    . TENDON REPAIR Bilateral 02/20/2019   Procedure: TENDON REPAIR BILATERAL FOREARMS;  Surgeon: [name], MD;  Location: [facility];  Service: Orthopedics;  Laterality: Bilateral;  . WOUND EXPLORATION Bilateral 02/20/2019   Procedure: WOUND EXPLORATION;  Surgeon: [name], MD;  Location:  SURGERY CENTER;  Service: Orthopedics;  Laterality: Bilateral;   Family History:  Family History  Problem Relation Age of Onset  . Atrial fibrillation Mother   . Bipolar disorder Paternal Uncle    Family Psychiatric  History: An paternal uncle has Bipolar Disorder. History of alcohol use disorder in extended family. Denies suicides in family. Tobacco Screening:   Social History:  Social History   Substance and Sexual Activity  Alcohol Use No     Social History   Substance and Sexual Activity  Drug Use No    Additional Social History:                           Allergies:  No Known Allergies Lab Results:  Results for orders placed or performed during the hospital encounter of 02/17/19 (from the past 48 hour(s))  Pregnancy, urine POC     Status: None   Collection Time: 02/20/19 12:59 PM  Result Value Ref Range   Preg Test, Ur NEGATIVE NEGATIVE    Comment:        THE SENSITIVITY OF THIS METHODOLOGY IS >24 mIU/mL     Blood Alcohol level:  Lab Results  Component Value Date   ETH <10 02/15/2019    Metabolic Disorder Labs:  Lab Results  Component Value Date   HGBA1C 5.6 02/19/2019   MPG 114.02 02/19/2019   No results found for: PROLACTIN Lab Results  Component Value Date   CHOL 191 02/19/2019    TRIG 123 02/19/2019   HDL 52 02/19/2019   CHOLHDL 3.7 02/19/2019   VLDL 25 02/19/2019   LDLCALC 114 (H) 02/19/2019   LDLCALC 101 (H) 05/25/2017    Current Medications: Current Facility-Administered Medications  Medication Dose Route Frequency Provider Last Rate Last Dose  . alum & mag hydroxide-simeth (MAALOX/MYLANTA) 200-200-20 MG/5ML suspension 30 mL  30 mL Oral Q4H PRN Antonieta Pert, MD      . HYDROcodone-acetaminophen (NORCO/VICODIN) 5-325 MG per tablet 2 tablet  2 tablet Oral Q6H PRN Antonieta Pert, MD      . hydrOXYzine (ATARAX/VISTARIL) tablet 25 mg  25 mg Oral Q6H PRN Antonieta Pert, MD   25 mg at 02/20/19 2000  . lamoTRIgine (LAMICTAL) tablet 150 mg  150 mg Oral BID Antonieta Pert, MD   150 mg at 02/21/19 1003  . LORazepam (ATIVAN) tablet 0.5 mg  0.5 mg Oral Q6H PRN Antonieta Pert, MD      . Melene Muller ON 02/22/2019] lurasidone (LATUDA) tablet 40 mg  40 mg Oral Q breakfast Antonieta Pert, MD      . magnesium hydroxide (MILK OF MAGNESIA) suspension  30 mL  30 mL Oral Daily PRN Antonieta Pert, MD      . ondansetron (ZOFRAN-ODT) disintegrating tablet 4 mg  4 mg Oral Q8H PRN Antonieta Pert, MD   4 mg at 02/20/19 2000  . prazosin (MINIPRESS) capsule 2 mg  2 mg Oral QHS Antonieta Pert, MD   2 mg at 02/20/19 2157  . sulfamethoxazole-trimethoprim (BACTRIM DS,SEPTRA DS) 800-160 MG per tablet 1 tablet  1 tablet Oral Q12H Antonieta Pert, MD   1 tablet at 02/21/19 1003  . topiramate (TOPAMAX) tablet 100 mg  100 mg Oral Daily Antonieta Pert, MD   100 mg at 02/21/19 1003  . traZODone (DESYREL) tablet 50 mg  50 mg Oral QHS PRN Antonieta Pert, MD   50 mg at 02/20/19 2157   PTA Medications: Medications Prior to Admission  Medication Sig Dispense Refill Last Dose  . ALPRAZolam (XANAX) 0.5 MG tablet Take 0.5 mg by mouth 2 (two) times daily as needed for anxiety.    Past Week at Unknown time  . amphetamine-dextroamphetamine (ADDERALL) 20 MG tablet Take 20  mg by mouth 3 (three) times daily.    Past Week at Unknown time  . Erenumab-aooe (AIMOVIG, 140 MG DOSE,) 70 MG/ML SOAJ Inject 70 mg into the skin every 30 (thirty) days.   Past Month at Unknown time  . HYDROcodone-acetaminophen (NORCO) 5-325 MG tablet 1-2 tabs po q6 hours prn pain 20 tablet 0   . lamoTRIgine (LAMICTAL) 150 MG tablet Take 150 mg by mouth 2 (two) times daily.   02/20/2019 at Unknown time  . prazosin (MINIPRESS) 2 MG capsule Take 2-4 mg by mouth at bedtime.   02/19/2019 at Unknown time  . sulfamethoxazole-trimethoprim (BACTRIM DS) 800-160 MG tablet Take 1 tablet by mouth 2 (two) times daily. 14 tablet 0   . SUMAtriptan (IMITREX) 50 MG tablet Take 50 mg by mouth every 2 (two) hours as needed for migraine. May repeat in 2 hours if headache persists or recurs.   More than a month at Unknown time  . topiramate (TOPAMAX) 100 MG tablet    02/20/2019 at Unknown time    Musculoskeletal: Strength & Muscle Tone: within normal limits Gait & Station: normal Patient leans: N/A  Psychiatric Specialty Exam: Physical Exam  Nursing note and vitals reviewed. Constitutional: She is oriented to person, place, and time. She appears well-developed and well-nourished.  Cardiovascular: Normal rate.  Respiratory: Effort normal.  Neurological: She is alert and oriented to person, place, and time.    Review of Systems  Constitutional: Negative.   Musculoskeletal: Positive for myalgias (bilateral arms (surgery 02/20/2019)).  Psychiatric/Behavioral: Positive for depression and suicidal ideas. Negative for hallucinations, memory loss and substance abuse. The patient is nervous/anxious and has insomnia.     Blood pressure 114/78, pulse (!) 102, temperature 98.5 F (36.9 C), temperature source Oral, resp. rate 16.There is no height or weight on file to calculate BMI.  See MD's admission SRA    Treatment Plan Summary: Daily contact with patient to assess and evaluate symptoms and progress in treatment and  Medication management   Continue inpatient hospitalization.  See MD's admission SRA for medication management.  Patient will participate in the therapeutic group milieu.  Discharge disposition in progress.   Observation Level/Precautions:  15 minute checks  Laboratory:  Reviewed  Psychotherapy:  Group therapy  Medications:  See MAR  Consultations:  PRN  Discharge Concerns:  Safety and stabilization  Estimated LOS: 3-5 days  Other:  Physician Treatment Plan for Primary Diagnosis: Bipolar I disorder (HCC) Long Term Goal(s): Improvement in symptoms so as ready for discharge  Short Term Goals: Ability to identify changes in lifestyle to reduce recurrence of condition will improve, Ability to verbalize feelings will improve and Ability to disclose and discuss suicidal ideas  Physician Treatment Plan for Secondary Diagnosis: Principal Problem:   Bipolar I disorder (HCC) Active Problems:   MDD (major depressive disorder), severe (HCC)  Long Term Goal(s): Improvement in symptoms so as ready for discharge  Short Term Goals: Ability to demonstrate self-control will improve, Ability to identify and develop effective coping behaviors will improve and Ability to identify triggers associated with substance abuse/mental health issues will improve  I certify that inpatient services furnished can reasonably be expected to improve the patient's condition.    Aldean Baker, NP 3/25/202010:50 AM

## 2019-02-21 NOTE — Progress Notes (Addendum)
Patient having a hard time sleeping, is in pain, and endorses thoughts of suicide including thinking of how she could hurt herself with things in her room here in the hospital (break the mirror). Pain medication could now be administered per MD order as it was too early before now. This RN provided support and worked though some of her feelings and circumstances. Patient reports she can contract for safety in the hospital. Patient remains safe and will continue to monitor.

## 2019-02-21 NOTE — Plan of Care (Signed)
Progress note  D: pt found in bed; allowed to rest. Pt states she had uncontrolled pain last night. Pt rates her pain a 7/10 on her L arm and a 4/10 on her R arm. Pt states that her middle/ring finger is still numb. Pt states she slept poorly. Pt rates her depression/hopelessness/anxiety a 3/3/8 out of 10 respectively. Pt denies any other problems besides her pain. Pt is still blaming herself. Pt states her goal for today is to focus on moving forward and not live in the past. Pt will achieve this by writing her strengths and weaknesses. Pt denies si/hi/ah/vh and verbally agrees to approach staff if these become apparent or before harming herself/others while at [facility].  A: pt provided support and encouragement. Pt given medication per protocol and standing orders. Q81m safety checks implemented and continued.  R: pt safe on the unit. Will continue to monitor.   Pt progressing in the following metrics  Problem: Education: Goal: Emotional status will improve Outcome: Progressing Goal: Mental status will improve Outcome: Progressing Goal: Verbalization of understanding the information provided will improve Outcome: Progressing   Problem: Activity: Goal: Interest or engagement in activities will improve Outcome: Progressing

## 2019-02-21 NOTE — Plan of Care (Signed)
D: Patient is alert, oriented, pleasant, and cooperative. Denies SI, HI, AVH, and verbally contracts for safety. Patient reports pain 7/10 in lacerations in forearms.    A: Medications administered per MD order. Support provided. Patient educated on safety on the unit and medications. Routine safety checks every 15 minutes. Patient stated understanding to tell nurse about any new physical symptoms. Patient understands to tell staff of any needs.     R: No adverse drug reactions noted. Patient verbally contracts for safety. Patient remains safe at this time and will continue to monitor.   Problem: Education: Goal: Mental status will improve Outcome: Progressing   Problem: Safety: Goal: Periods of time without injury will increase Outcome: Progressing   Patient denies SI, HI, AVH, and contracts for safety. Patient remains safe and will continue to monitor.

## 2019-02-21 NOTE — BHH Suicide Risk Assessment (Signed)
[facility] Admission Suicide Risk Assessment   Nursing information obtained from:    Demographic factors:    Current Mental Status:    Loss Factors:    Historical Factors:    Risk Reduction Factors:     Total Time spent with patient: 20 minutes Principal Problem: <principal problem not specified> Diagnosis:  Active Problems:   MDD (major depressive disorder), severe (HCC)  Subjective Data: Patient is seen and examined.  Patient is a 38 year old female with a reported past psychiatric history significant for bipolar disorder as well as posttraumatic stress disorder.  She was originally admitted on 02/18/2019 after an intentional overdose of Xanax and that after the overdose she cut herself on both forearms resulting significant blood loss which required multiple staples.  Likely she contacted a friend who had contacted 911 and was brought to the emergency department.  On her original H&P she was placed on Lamictal, Minipress and Topamax.  Latuda also was helpful.  This was started at 20 mg p.o. daily, the Lamictal at 150 mg p.o. twice daily, and the Minipress at 2 mg p.o. nightly and as well Topamax 100 mg p.o. daily.  She remained depressed, but required wound exploration through surgery.  She was essentially discharged to the surgical service, she was returned to our service yesterday afternoon.  She remains depressed.  Nursing notes reflect that she feels almost like a failure because she was not successful in her suicide attempt.  She related that she had been treated with antidepressants in the past, but that it led to more manic symptoms.  Last night she did not sleep well, and her pain caused her to have significant suicidal thoughts again.  Discussion today was about increasing the dosage of her narcotic pain medications.  She was readmitted to the hospital for evaluation and stabilization.  Continued Clinical Symptoms:    The "Alcohol Use Disorders Identification Test", Guidelines for Use in  Primary Care, Second Edition.  World Science writer [facility]). Score between 0-7:  no or low risk or alcohol related problems. Score between 8-15:  moderate risk of alcohol related problems. Score between 16-19:  high risk of alcohol related problems. Score 20 or above:  warrants further diagnostic evaluation for alcohol dependence and treatment.   CLINICAL FACTORS:   Bipolar Disorder:   Depressive phase   Musculoskeletal: Strength & Muscle Tone: within normal limits Gait & Station: normal Patient leans: N/A  Psychiatric Specialty Exam: Physical Exam  Nursing note and vitals reviewed. Constitutional: She is oriented to person, place, and time. She appears well-developed and well-nourished.  HENT:  Head: Normocephalic and atraumatic.  Respiratory: Effort normal.  Neurological: She is alert and oriented to person, place, and time.    ROS  Blood pressure 114/78, pulse (!) 102, temperature 98.5 F (36.9 C), temperature source Oral, resp. rate 16.There is no height or weight on file to calculate BMI.  General Appearance: Casual  Eye Contact:  Fair  Speech:  Normal Rate  Volume:  Decreased  Mood:  Depressed  Affect:  Congruent  Thought Process:  Coherent and Descriptions of Associations: Circumstantial  Orientation:  Full (Time, Place, and Person)  Thought Content:  Logical  Suicidal Thoughts:  Yes.  without intent/plan  Homicidal Thoughts:  No  Memory:  Immediate;   Fair Recent;   Fair Remote;   Fair  Judgement:  Impaired  Insight:  Lacking  Psychomotor Activity:  Psychomotor Retardation  Concentration:  Concentration: Fair and Attention Span: Fair  Recall:  Fair  Fund of Knowledge:  Fair  Language:  Fair  Akathisia:  Negative  Handed:  Right  AIMS (if indicated):     Assets:  Desire for Improvement Housing Leisure Time Physical Health Resilience  ADL's:  Intact  Cognition:  WNL  Sleep:  Number of Hours: 1      COGNITIVE FEATURES THAT CONTRIBUTE TO RISK:   None    SUICIDE RISK:   Moderate:  Frequent suicidal ideation with limited intensity, and duration, some specificity in terms of plans, no associated intent, good self-control, limited dysphoria/symptomatology, some risk factors present, and identifiable protective factors, including available and accessible social support.  PLAN OF CARE: Patient is seen and examined.  Patient is a 38 year old female with the above-stated past psychiatric history who was readmitted to the psychiatric unit after transfer from the surgical service to do exploratory evaluation of the wounds from her injuries that were self inflicted.  Her medications were restarted at their previous dosages last night upon her return.  She remains depressed.  She regrets not being successful with her suicide attempt.  She has had problems with antidepressant medications leading to manic episodes in the past.  We will discuss with her the possibility of low-dose Wellbutrin.  In the meantime we will increase her Latuda to 40 mg p.o. daily for mood stability.  Also her hydrocodone will be increased to 2 tablets p.o. every 6 hours as needed pain from her wound.  Her lorazepam and Lamictal will be continued.  We will continue to assess possible interventions to assist her mood.  The Topamax will remain at 100 mg p.o. daily, and the prazosin at 2 mg p.o. nightly.  She is on Bactrim DS 1 tablet p.o. every 12 hours for 7 days postoperatively.  Her vital signs are stable, she is mildly tachycardic at a rate of 102.  She only slept 1 hour last night, but hopefully the increase in the pain medication will assist with this.  I certify that inpatient services furnished can reasonably be expected to improve the patient's condition.   Antonieta Pert, MD 02/21/2019, 10:42 AM

## 2019-02-22 MED ORDER — LURASIDONE HCL 20 MG PO TABS
20.0000 mg | ORAL_TABLET | Freq: Once | ORAL | Status: AC
  Administered 2019-02-22: 20 mg via ORAL
  Filled 2019-02-22: qty 1

## 2019-02-22 MED ORDER — HYDROCODONE-ACETAMINOPHEN 10-325 MG PO TABS
1.0000 | ORAL_TABLET | Freq: Four times a day (QID) | ORAL | Status: DC | PRN
  Administered 2019-02-22 – 2019-02-26 (×6): 1 via ORAL
  Filled 2019-02-22 (×7): qty 1

## 2019-02-22 MED ORDER — HYDROCODONE-ACETAMINOPHEN 7.5-325 MG PO TABS: 2.0000 | ORAL_TABLET | Freq: Four times a day (QID) | ORAL | Status: DC | PRN

## 2019-02-22 MED ORDER — HYDROCODONE-ACETAMINOPHEN 5-325 MG PO TABS
1.0000 | ORAL_TABLET | Freq: Four times a day (QID) | ORAL | Status: DC | PRN
  Administered 2019-02-22 – 2019-02-25 (×10): 1 via ORAL
  Filled 2019-02-22 (×10): qty 1

## 2019-02-22 MED ORDER — LURASIDONE HCL 60 MG PO TABS
60.0000 mg | ORAL_TABLET | Freq: Every day | ORAL | Status: DC
  Administered 2019-02-23 – 2019-02-26 (×4): 60 mg via ORAL
  Filled 2019-02-22 (×5): qty 1

## 2019-02-22 MED ORDER — ALPRAZOLAM 0.5 MG PO TABS: 0.5000 mg | ORAL_TABLET | Freq: Two times a day (BID) | ORAL | Status: DC | PRN

## 2019-02-22 NOTE — Progress Notes (Signed)
Patient's L arm is feeling better than yesterday.  Continues to have more pain in L arm than the R arm.  Patient's third and fourth fingers L hand are not as numb as yesterday.  L arm/hand continues to be more painful that the R arm/hand.  Presently pain level L arm #8.  R arm pain #7.    Patient is concerned about being discharged.  Patient does not feel she is ready for discharge at this time.  Concerned about family issues where she will be living and medications.

## 2019-02-22 NOTE — Progress Notes (Signed)
Adult Psychoeducational Group Note  Date:  02/22/2019 Time:  4:44 AM  Group Topic/Focus:  Wrap-Up Group:   The focus of this group is to help patients review their daily goal of treatment and discuss progress on daily workbooks.  Participation Level:  Active  Participation Quality:  Appropriate  Affect:  Appropriate  Cognitive:  Appropriate  Insight: Appropriate and Good  Engagement in Group:  Engaged  Modes of Intervention:  Discussion  Additional Comments:  Pt said her day was a 7. The one positive thing that happened to her being with people help her today.  Charna Busman Long 02/22/2019, 4:44 AM

## 2019-02-22 NOTE — Progress Notes (Signed)
On initial approach, patient is sleeping, but wakens with verbal prompt to meet this nurse at the medication window. Patient presents sullen, contrite mood. Patient compliant with scheduled medications. Patient currently denies SI/HI/AVH.    Patient is educated about and provided medication per provider's orders. Patient safety maintained with q15 min safety checks and low fall risk precautions. Emotional support given, 1:1 interaction, and active listening provided. Patient encouraged to attend meals, groups, and work on treatment plan and goals. Labs, vital signs and patient behavior monitored throughout shift.    Patient contracts for safety with staff. Patient remains safe on the unit at this time and agrees to come to staff with any issues/concerns. Will continue to support and monitor.

## 2019-02-22 NOTE — Progress Notes (Signed)
[facility] MD Progress Note  02/22/2019 10:32 AM Shelby Gallegos  MRN:  [id number] Subjective:  "I'm hurting pretty bad."  Shelby Gallegos found asleep in bed. Presents with flat affect, fair eye contact, slow to respond to questions. She reports the pain medicine makes her sleepy but still reports postsurgical pain of 8/10 in left arm, 7/10 in right arm. Hand/wrist movement and sensation intact with good capillary refill. States that ice packs are helping. Rates depression as 7/10 today. She reports intermittent suicidal thoughts and is reluctant to provide details at first but states she has had thoughts of hanging herself with a rope, crashing a car or drinking gasoline. She says she tries to stay busy and distract herself when having suicidal thoughts. Denies suicidal plan or intent on the unit and contracts for safety. She reports anxiety related to being around family at discharge, as she feels they do not understand her mental health problems. Also reports anxiety with figuring out her job situation. Denies HI/AVH. Denies medication side effects. Sleep has been fair but interrupted by pain in arms overnight. She is interested in PHP at discharge- CSW notified.  From admission H&P: 38 year old female. Attempted suicide 3/19 by overdosing on prescribed Alprazolam And on OTC sleeping medication ( Nyquil) . States she took " a whole bottle of Xanax). Following overdose she also cut herself on both forearms resulting in significant blood loss,needing multiple staples. States a friend contacted 911 and was brought to ED. States suicide attempt had been beenformed/planned earlier that day and that at the time " I felt really at peace with it". She cannot identify any specific triggers that may have exacerbated her depression or suicidal ideations. 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 Endorseshistory of worsening depression and endorses neuro vegetative symptomsas below, denies hallucinations     Principal Problem: Bipolar I disorder (HCC) Diagnosis: Principal Problem:   Bipolar I disorder (HCC) Active Problems:   MDD (major depressive disorder), severe (HCC)  Total Time spent with patient: 20 minutes  Past Psychiatric History: See admission H&P  Past Medical History:  Past Medical History:  Diagnosis Date  . ADHD (attention deficit hyperactivity disorder)   . Bipolar 1 disorder (HCC)   . Migraine   . Suicide attempt by drug overdose (HCC) 02/15/2019    Past Surgical History:  Procedure Laterality Date  . NO PAST SURGERIES    . TENDON REPAIR Bilateral 02/20/2019   Procedure: TENDON REPAIR BILATERAL FOREARMS;  Surgeon: [name], MD;  Location: [facility];  Service: Orthopedics;  Laterality: Bilateral;  . WOUND EXPLORATION Bilateral 02/20/2019   Procedure: WOUND EXPLORATION;  Surgeon: [name], MD;  Location: [facility];  Service: Orthopedics;  Laterality: Bilateral;   Family History:  Family History  Problem Relation Age of Onset  . Atrial fibrillation Mother   . Bipolar disorder Paternal Uncle    Family Psychiatric  History: See admission H&P Social History:  Social History   Substance and Sexual Activity  Alcohol Use No     Social History   Substance and Sexual Activity  Drug Use No    Social History   Socioeconomic History  . Marital status: Single    Spouse name: Not on file  . Number of children: Not on file  .  Years of education: Not on file  . Highest education level: Not on file  Occupational History  . Not on file  Social Needs  . Financial resource strain: Not on file  . Food insecurity:    Worry: Not on file    Inability: Not on file  . Transportation needs:    Medical: Not on file    Non-medical: Not on file  Tobacco  Use  . Smoking status: Never Smoker  . Smokeless tobacco: Never Used  Substance and Sexual Activity  . Alcohol use: No  . Drug use: No  . Sexual activity: Not Currently  Lifestyle  . Physical activity:    Days per week: Not on file    Minutes per session: Not on file  . Stress: Not on file  Relationships  . Social connections:    Talks on phone: Not on file    Gets together: Not on file    Attends religious service: Not on file    Active member of club or organization: Not on file    Attends meetings of clubs or organizations: Not on file    Relationship status: Not on file  Other Topics Concern  . Not on file  Social History Narrative  . Not on file   Additional Social History:                         Sleep: Fair  Appetite:  Good  Current Medications: Current Facility-Administered Medications  Medication Dose Route Frequency Provider Last Rate Last Dose  . alum & mag hydroxide-simeth (MAALOX/MYLANTA) 200-200-20 MG/5ML suspension 30 mL  30 mL Oral Q4H PRN Antonieta Pert, MD      . HYDROcodone-acetaminophen (NORCO/VICODIN) 5-325 MG per tablet 2 tablet  2 tablet Oral Q6H PRN Antonieta Pert, MD   2 tablet at 02/22/19 0620  . hydrOXYzine (ATARAX/VISTARIL) tablet 25 mg  25 mg Oral Q6H PRN Antonieta Pert, MD   25 mg at 02/20/19 2000  . lamoTRIgine (LAMICTAL) tablet 150 mg  150 mg Oral BID Antonieta Pert, MD   150 mg at 02/22/19 0841  . LORazepam (ATIVAN) tablet 0.5 mg  0.5 mg Oral Q6H PRN Antonieta Pert, MD      . lurasidone (LATUDA) tablet 40 mg  40 mg Oral Q breakfast Antonieta Pert, MD   40 mg at 02/22/19 [phone]  . magnesium hydroxide (MILK OF MAGNESIA) suspension 30 mL  30 mL Oral Daily PRN Antonieta Pert, MD      . ondansetron (ZOFRAN-ODT) disintegrating tablet 4 mg  4 mg Oral Q8H PRN Antonieta Pert, MD   4 mg at 02/20/19 2000  . prazosin (MINIPRESS) capsule 2 mg  2 mg Oral QHS Antonieta Pert, MD   2 mg at 02/21/19 2115  .  sulfamethoxazole-trimethoprim (BACTRIM DS,SEPTRA DS) 800-160 MG per tablet 1 tablet  1 tablet Oral Q12H Antonieta Pert, MD   1 tablet at 02/22/19 [phone]  . topiramate (TOPAMAX) tablet 100 mg  100 mg Oral Daily Antonieta Pert, MD   100 mg at 02/22/19 0841  . traZODone (DESYREL) tablet 50 mg  50 mg Oral QHS PRN Antonieta Pert, MD   50 mg at 02/22/19 0013    Lab Results:  Results for orders placed or performed during the hospital encounter of 02/17/19 (from the past 48 hour(s))  Pregnancy, urine POC     Status: None   Collection Time: 02/20/19  12:59 PM  Result Value Ref Range   Preg Test, Ur NEGATIVE NEGATIVE    Comment:        THE SENSITIVITY OF THIS METHODOLOGY IS >24 mIU/mL     Blood Alcohol level:  Lab Results  Component Value Date   ETH <10 02/15/2019    Metabolic Disorder Labs: Lab Results  Component Value Date   HGBA1C 5.6 02/19/2019   MPG 114.02 02/19/2019   No results found for: PROLACTIN Lab Results  Component Value Date   CHOL 191 02/19/2019   TRIG 123 02/19/2019   HDL 52 02/19/2019   CHOLHDL 3.7 02/19/2019   VLDL 25 02/19/2019   LDLCALC 114 (H) 02/19/2019   LDLCALC 101 (H) 05/25/2017    Physical Findings: AIMS: Facial and Oral Movements Muscles of Facial Expression: None, normal Lips and Perioral Area: None, normal Jaw: None, normal Tongue: None, normal,Extremity Movements Upper (arms, wrists, hands, fingers): None, normal Lower (legs, knees, ankles, toes): None, normal, Trunk Movements Neck, shoulders, hips: None, normal, Overall Severity Severity of abnormal movements (highest score from questions above): None, normal Incapacitation due to abnormal movements: None, normal Patient's awareness of abnormal movements (rate only patient's report): No Awareness, Dental Status Current problems with teeth and/or dentures?: No Does patient usually wear dentures?: No  CIWA:    COWS:     Musculoskeletal: Strength & Muscle Tone: within normal  limits Gait & Station: normal Patient leans: N/A  Psychiatric Specialty Exam: Physical Exam  Nursing note and vitals reviewed. Constitutional: She is oriented to person, place, and time. She appears well-developed and well-nourished.  Respiratory: Effort normal.  Neurological: She is alert and oriented to person, place, and time.    Review of Systems  Constitutional: Negative.   Psychiatric/Behavioral: Positive for depression and suicidal ideas. Negative for hallucinations, memory loss and substance abuse. The patient is not nervous/anxious and does not have insomnia.     Blood pressure 108/79, pulse (!) 115, temperature 98 F (36.7 C), temperature source Oral, resp. rate 16.There is no height or weight on file to calculate BMI.  General Appearance: Casual  Eye Contact:  Fair  Speech:  Slow  Volume:  Decreased  Mood:  Depressed  Affect:  Flat  Thought Process:  Coherent  Orientation:  Full (Time, Place, and Person)  Thought Content:  Rumination  Suicidal Thoughts:  Yes.  with intent/plan thoughts of hanging self, drinking gasoline, or wrecking car. Denies intent or plan on the unit and contracts for safety.  Homicidal Thoughts:  No  Memory:  Immediate;   Fair Recent;   Fair  Judgement:  Fair  Insight:  Fair  Psychomotor Activity:  Decreased  Concentration:  Concentration: Fair  Recall:  Fair  Fund of Knowledge:  Good  Language:  Fair  Akathisia:  No  Handed:  Right  AIMS (if indicated):     Assets:  Architect Housing  ADL's:  Intact  Cognition:  WNL  Sleep:  Number of Hours: 3.75     Treatment Plan Summary: Daily contact with patient to assess and evaluate symptoms and progress in treatment and Medication management   Continue inpatient hospitalization.  Increase Latuda to 60 mg mg PO daily for mood Change Norco 5-325 mg to 1-2 tabs PO Q6HR PRN moderate pain Resume Xanax 0.5 mg PO BID PRN anxiety Continue Lamictal 150  mg PO BID for mood Continue Minipress 2 mg PO QHS for nightmares Continue Topamax 100 mg PO daily for migraines Continue Vistaril 25 mg  PO Q6HR PRN anxiety Continue trazodone 50 mg PO QHS PRN insomnia Continue Neosporin topical BID for wounds  Patient will participate in the therapeutic group milieu.  Discharge disposition in progress.   Aldean Baker, NP 02/22/2019, 10:32 AM

## 2019-02-22 NOTE — Progress Notes (Signed)
Patient had been made a do not admit on 02/20/2019 due to low grade fever. Patient has now been free of flu like symptoms and fever for over 24 hours. This RN spoke to infection control and was informed the patient no longer has to be a do not admit.   

## 2019-02-22 NOTE — [facility] Suicide Risk Assessment (Signed)
[facility] INPATIENT:  Family/Significant Other Suicide Prevention Education  Suicide Prevention Education:  Contact Attempts: with Hardie Pulley, friend [phone]) has been identified by the patient as the family member/significant other with whom the patient will be residing, and identified as the person(s) who will aid the patient in the event of a mental health crisis.  With written consent from the patient, two attempts were made to provide suicide prevention education, prior to and/or following the patient's discharge.  We were unsuccessful in providing suicide prevention education.  A suicide education pamphlet was given to the patient to share with family/significant other.  Date and time of first attempt:02/22/2019 / 11:03am   Maeola Sarah 02/22/2019, 11:03 AM

## 2019-02-22 NOTE — Plan of Care (Signed)
Nurse discussed anxiety, depression, coping skills with patient. 

## 2019-02-22 NOTE — [facility] Suicide Risk Assessment (Signed)
[facility] INPATIENT:  Family/Significant Other Suicide Prevention Education  Suicide Prevention Education:  Contact Attempts: Hardie Pulley, friend [phone]) has been identified by the patient as the family member/significant other with whom the patient will be residing, and identified as the person(s) who will aid the patient in the event of a mental health crisis.  With written consent from the patient, two attempts were made to provide suicide prevention education, prior to and/or following the patient's discharge.  We were unsuccessful in providing suicide prevention education.  A suicide education pamphlet was given to the patient to share with family/significant other.   Date and time of second attempt:02/22/2019 / 1:48pm  CSW unable to leave a HIPAA compliant voicemail.   Maeola Sarah 02/22/2019, 1:47 PM

## 2019-02-23 NOTE — Tx Team (Signed)
Interdisciplinary Treatment and Diagnostic Plan Update  02/23/2019 Time of Session:  Shelby Gallegos MRN: [id number]  Principal Diagnosis: Bipolar I disorder (HCC)  Secondary Diagnoses: Principal Problem:   Bipolar I disorder (HCC) Active Problems:   MDD (major depressive disorder), severe (HCC)   Current Medications:  Current Facility-Administered Medications  Medication Dose Route Frequency Provider Last Rate Last Dose  . ALPRAZolam Prudy Feeler(XANAX) tablet 0.5 mg  0.5 mg Oral BID PRN Antonieta Pertlary, Greg Lawson, MD      . alum & mag hydroxide-simeth (MAALOX/MYLANTA) 200-200-20 MG/5ML suspension 30 mL  30 mL Oral Q4H PRN Antonieta Pertlary, Greg Lawson, MD      . HYDROcodone-acetaminophen (NORCO/VICODIN) 5-325 MG per tablet 1 tablet  1 tablet Oral Q6H PRN Cobos, Rockey SituFernando A, MD   1 tablet at 02/23/19 [phone]   And  . HYDROcodone-acetaminophen (NORCO) 10-325 MG per tablet 1 tablet  1 tablet Oral Q6H PRN Cobos, Rockey SituFernando A, MD   1 tablet at 02/23/19 0806  . hydrOXYzine (ATARAX/VISTARIL) tablet 25 mg  25 mg Oral Q6H PRN Antonieta Pertlary, Greg Lawson, MD   25 mg at 02/22/19 1854  . lamoTRIgine (LAMICTAL) tablet 150 mg  150 mg Oral BID Antonieta Pertlary, Greg Lawson, MD   150 mg at 02/23/19 0805  . Lurasidone HCl TABS 60 mg  60 mg Oral Q breakfast Antonieta Pertlary, Greg Lawson, MD   60 mg at 02/23/19 0804  . magnesium hydroxide (MILK OF MAGNESIA) suspension 30 mL  30 mL Oral Daily PRN Antonieta Pertlary, Greg Lawson, MD      . ondansetron (ZOFRAN-ODT) disintegrating tablet 4 mg  4 mg Oral Q8H PRN Antonieta Pertlary, Greg Lawson, MD   4 mg at 02/20/19 2000  . prazosin (MINIPRESS) capsule 2 mg  2 mg Oral QHS Antonieta Pertlary, Greg Lawson, MD   2 mg at 02/22/19 2203  . sulfamethoxazole-trimethoprim (BACTRIM DS,SEPTRA DS) 800-160 MG per tablet 1 tablet  1 tablet Oral Q12H Antonieta Pertlary, Greg Lawson, MD   1 tablet at 02/23/19 0804  . topiramate (TOPAMAX) tablet 100 mg  100 mg Oral Daily Antonieta Pertlary, Greg Lawson, MD   100 mg at 02/23/19 0805  . traZODone (DESYREL) tablet 50 mg  50 mg Oral QHS PRN Antonieta Pertlary, Greg Lawson, MD    50 mg at 02/22/19 2201   PTA Medications: Medications Prior to Admission  Medication Sig Dispense Refill Last Dose  . ALPRAZolam (XANAX) 0.5 MG tablet Take 0.5 mg by mouth 2 (two) times daily as needed for anxiety.    Past Week at Unknown time  . amphetamine-dextroamphetamine (ADDERALL) 20 MG tablet Take 20 mg by mouth 3 (three) times daily.    Past Week at Unknown time  . Erenumab-aooe (AIMOVIG, 140 MG DOSE,) 70 MG/ML SOAJ Inject 70 mg into the skin every 30 (thirty) days.   Past Month at Unknown time  . HYDROcodone-acetaminophen (NORCO) 5-325 MG tablet 1-2 tabs po q6 hours prn pain 20 tablet 0   . lamoTRIgine (LAMICTAL) 150 MG tablet Take 150 mg by mouth 2 (two) times daily.   02/20/2019 at Unknown time  . prazosin (MINIPRESS) 2 MG capsule Take 2-4 mg by mouth at bedtime.   02/19/2019 at Unknown time  . sulfamethoxazole-trimethoprim (BACTRIM DS) 800-160 MG tablet Take 1 tablet by mouth 2 (two) times daily. 14 tablet 0   . SUMAtriptan (IMITREX) 50 MG tablet Take 50 mg by mouth every 2 (two) hours as needed for migraine. May repeat in 2 hours if headache persists or recurs.   More than a month at Unknown time  .  topiramate (TOPAMAX) 100 MG tablet    02/20/2019 at Unknown time    Patient Stressors:    Patient Strengths:    Treatment Modalities: Medication Management, Group therapy, Case management,  1 to 1 session with clinician, Psychoeducation, Recreational therapy.   Physician Treatment Plan for Primary Diagnosis: Bipolar I disorder (HCC) Long Term Goal(s): Improvement in symptoms so as ready for discharge Improvement in symptoms so as ready for discharge   Short Term Goals: Ability to identify changes in lifestyle to reduce recurrence of condition will improve Ability to verbalize feelings will improve Ability to disclose and discuss suicidal ideas Ability to demonstrate self-control will improve Ability to identify and develop effective coping behaviors will improve Ability to  identify triggers associated with substance abuse/mental health issues will improve  Medication Management: Evaluate patient's response, side effects, and tolerance of medication regimen.  Therapeutic Interventions: 1 to 1 sessions, Unit Group sessions and Medication administration.  Evaluation of Outcomes: Progressing  Physician Treatment Plan for Secondary Diagnosis: Principal Problem:   Bipolar I disorder (HCC) Active Problems:   MDD (major depressive disorder), severe (HCC)  Long Term Goal(s): Improvement in symptoms so as ready for discharge Improvement in symptoms so as ready for discharge   Short Term Goals: Ability to identify changes in lifestyle to reduce recurrence of condition will improve Ability to verbalize feelings will improve Ability to disclose and discuss suicidal ideas Ability to demonstrate self-control will improve Ability to identify and develop effective coping behaviors will improve Ability to identify triggers associated with substance abuse/mental health issues will improve     Medication Management: Evaluate patient's response, side effects, and tolerance of medication regimen.  Therapeutic Interventions: 1 to 1 sessions, Unit Group sessions and Medication administration.  Evaluation of Outcomes: Progressing   RN Treatment Plan for Primary Diagnosis: Bipolar I disorder (HCC) Long Term Goal(s): Knowledge of disease and therapeutic regimen to maintain health will improve  Short Term Goals: Ability to participate in decision making will improve, Ability to verbalize feelings will improve, Ability to disclose and discuss suicidal ideas, Ability to identify and develop effective coping behaviors will improve and Compliance with prescribed medications will improve  Medication Management: RN will administer medications as ordered by provider, will assess and evaluate patient's response and provide education to patient for prescribed medication. RN will report  any adverse and/or side effects to prescribing provider.  Therapeutic Interventions: 1 on 1 counseling sessions, Psychoeducation, Medication administration, Evaluate responses to treatment, Monitor vital signs and CBGs as ordered, Perform/monitor CIWA, COWS, AIMS and Fall Risk screenings as ordered, Perform wound care treatments as ordered.  Evaluation of Outcomes: Progressing   LCSW Treatment Plan for Primary Diagnosis: Bipolar I disorder (HCC) Long Term Goal(s): Safe transition to appropriate next level of care at discharge, Engage patient in therapeutic group addressing interpersonal concerns.  Short Term Goals: Engage patient in aftercare planning with referrals and resources  Therapeutic Interventions: Assess for all discharge needs, 1 to 1 time with Social worker, Explore available resources and support systems, Assess for adequacy in community support network, Educate family and significant other(s) on suicide prevention, Complete Psychosocial Assessment, Interpersonal group therapy.  Evaluation of Outcomes: Progressing   Progress in Treatment: Attending groups: No. Participating in groups: No. Taking medication as prescribed: Yes. Toleration medication: Yes. Family/Significant other contact made: No, will contact:  patient's friend, multiple attempts Patient understands diagnosis: Yes. Discussing patient identified problems/goals with staff: Yes. Medical problems stabilized or resolved: Yes. Denies suicidal/homicidal ideation: Yes. Issues/concerns per patient  self-inventory: No. Other:   New problem(s) identified: None   New Short Term/Long Term Goal(s):medication stabilization, elimination of SI thoughts, development of comprehensive mental wellness plan.    Patient Goals:     Discharge Plan or Barriers: Patient plans to discharge home. She will continue to follow up with Dr.Kaur for medication management services and [facility] for therapy services.  Patient expressed interest in PHP/IOP services in addition to her current providers. CSW will continue to follow.   Reason for Continuation of Hospitalization: Anxiety Depression Medication stabilization Suicidal ideation  Estimated Length of Stay: 02/26/2019  Attendees: Patient: 02/23/2019 9:57 AM  Physician: Dr. Landry Mellow, MD 02/23/2019 9:57 AM  Nursing: Marchelle Folks.Salena Saner, RN 02/23/2019 9:57 AM  RN Care Manager: 02/23/2019 9:57 AM  Social Worker: Baldo Daub, LCSWA 02/23/2019 9:57 AM  Recreational Therapist:  02/23/2019 9:57 AM  Other:  02/23/2019 9:57 AM  Other:  02/23/2019 9:57 AM  Other: 02/23/2019 9:57 AM    Scribe for Treatment Team: Maeola Sarah, LCSWA 02/23/2019 9:57 AM

## 2019-02-23 NOTE — Progress Notes (Signed)
Nursing Progress Note: 7p-7a D: Pt currently presents with a anxious/pleasant affect and behavior. Pt states "I slept a lot all day. Norco has really helped but I'm still feeling some minor pins and needles in my left thumb." Interacting appropriately with the milieu. Pt reports good sleep during the previous night with current medication regimen. Pt did attend wrap-up group.  A: Pt provided with medications per providers orders. Pt's labs and vitals were monitored throughout the night. Pt supported emotionally and encouraged to express concerns and questions. Pt educated on medications.  R: Pt's safety ensured with 15 minute and environmental checks. Pt currently denies SI, HI, and AVH. Pt verbally contracts to seek staff if SI,HI, or AVH occurs and to consult with staff before acting on any harmful thoughts. Will continue to monitor.

## 2019-02-23 NOTE — Progress Notes (Signed)
Patient ID: Shelby Gallegos, female   DOB: [birth year], 38 y.o.   MRN: [id number] D: Patient observed watching TV and interacting well with peers on approach. Pt mood and affect appears depressed and anxious. Pt reports she is worried about possible discharge tomorrow because she believes she is not ready. Pt attended evening wrap up group and engaged with peers. Denies  SI/HI/AVH.No behavioral issues noted.  A: Pt able to move and use fingers to pickup medication cup. Medications administered as prescribed. Support and encouragement offered as needed to express needs.  R: Patient is safe and cooperative on unit. Will continue to monitor  for safety and stability.

## 2019-02-23 NOTE — Progress Notes (Signed)
Recreation Therapy Notes  Date:  3.27.20 Time: 0930 Location: [address] Dayroom  Group Topic: Stress Management  Goal Area(s) Addresses:  Patient will identify positive stress management techniques. Patient will identify benefits of using stress management post d/c.  Intervention:  Stress Management  Activity :  Guided Imagery.  LRT introduced the stress management technique of guided imagery.  LRT read a script that lead patients on a journey on the beach at sunset.  Patients were to listen and follow along as script was read to engage in activity.  Education:  Stress Management, Discharge Planning.   Education Outcome: Acknowledges Education  Clinical Observations/Feedback:  Pt did not attend group.     Angeliah Wisdom, LRT/CTRS         Deonne Rooks A 02/23/2019 10:58 AM 

## 2019-02-23 NOTE — Plan of Care (Signed)
  Problem: Education: Goal: Knowledge of [facility] Education information/materials will improve Outcome: Progressing   Problem: Activity: Goal: Interest or engagement in activities will improve Outcome: Progressing   Problem: Health Behavior/Discharge Planning: Goal: Compliance with treatment plan for underlying cause of condition will improve Outcome: Progressing   Problem: Safety: Goal: Periods of time without injury will increase Outcome: Progressing   

## 2019-02-23 NOTE — Progress Notes (Addendum)
Patient ID: Shelby Gallegos, female   DOB: May 20, 1981, 38 y.o.   MRN: [id number]  Nursing Progress Note [id number]  On initial approach, patient is seen up at the medication window waiting for writer. Patient presents anxious on approach with complaints of pain 10/10 to her arms bilaterally. Patient with compression wraps to arms bilaterally that are clean, dry and intact. Unable to assess incision at this time. Circulation, movement and sensation are all WNL to patient's fingers bilaterally. Patient reports to writer this morning she does not feel comfortable discharging today and is worried about managing her mental health and medical conditions. Patient states her parents will be her main support to her. Patient compliant with scheduled medications and is provided medication for pain relief as ordered. Patient is seen attending groups and visible in the milieu. Patient currently denies SI/HI/AVH.   Patient is educated about and provided medication per provider's orders. Patient safety maintained with q15 min safety checks and high fall risk precautions. Emotional support given, 1:1 interaction, and active listening provided. Patient encouraged to attend meals, groups, and work on treatment plan and goals. Labs, vital signs and patient behavior monitored throughout shift.   Patient contracts for safety with staff. Patient remains safe on the unit at this time and agrees to come to staff with any issues/concerns. Patient is interacting with peers appropriately on the unit. Will continue to support and monitor.   Patient's self-inventory sheet Rated Energy Level  Low  Rated Sleep  Fair  Rated Appetite  Fair  Rated Anxiety (0-10)  7  Rated Hopelessness (0-10)  7  Rated Depression (0-10)  8  Daily Goal  "to work toward discharge'  Any Additional Comments:

## 2019-02-23 NOTE — Progress Notes (Signed)
Adult Psychoeducational Group Note  Date:  02/23/2019 Time:  4:59 AM  Group Topic/Focus:  Wrap-Up Group:   The focus of this group is to help patients review their daily goal of treatment and discuss progress on daily workbooks.  Participation Level:  Active  Participation Quality:  Appropriate  Affect:  Appropriate  Cognitive:  Appropriate  Insight: Appropriate and Good  Engagement in Group:  Engaged  Modes of Intervention:  Discussion  Additional Comments:  Pt said her day was a 7. The one positive thing that happen to her there were birds outside her window in a nest comfort for her to watch. Charna Busman Long 02/23/2019, 4:59 AM

## 2019-02-23 NOTE — Progress Notes (Signed)
[facility] MD Progress Note  02/23/2019 11:11 AM Shelby Gallegos  MRN:  [id number] Subjective:  38 year old female. Attempted suicide 3/19 by overdosing on prescribed Alprazolam And on OTC sleeping medication ( Nyquil) . States she took " a whole bottle of Xanax). Following overdose she also cut herself on both forearms resulting in significant blood loss,needing multiple staples. States a friend contacted 911 and was brought to ED. States suicide attempt had been beenformed/planned earlier that day and that at the time " I felt really at peace with it". She cannot identify any specific triggers that may have exacerbated her depression or suicidal ideations. 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 Endorseshistory of worsening depression and endorses neuro vegetative symptomsas below, denies hallucinations  Objective: Patient is seen and examined.  Patient is a 38 year old female with the above-stated past psychiatric history who is seen in follow-up.  She is slowly improving.  She still very unsettled about the fact that becoming a peer group specialist added to her anxious, and provoke such severe anxiety that she hurt her self.  We discussed that at length today.  We discussed OP skills training as well as working on her self.  She did deny any current suicidality.  She denied any side effects from her current medications.  She stated that her pain control was improved with the increase in her narcotic pain medication.  Her vital signs are stable, she is mildly tachycardic with a rate of 107.  She slept 6.25 hours last night.  We discussed the fact that she has an appointment with her psychiatrist on Monday, and that we would most likely discharge her either on Sunday or early Monday to be able to make it to that  appointment.  She is in agreement with that plan.  Principal Problem: Bipolar I disorder (HCC) Diagnosis: Principal Problem:   Bipolar I disorder (HCC) Active Problems:   MDD (major depressive disorder), severe (HCC)  Total Time spent with patient: 20 minutes  Past Psychiatric History: See admission H&P  Past Medical History:  Past Medical History:  Diagnosis Date  . ADHD (attention deficit hyperactivity disorder)   . Bipolar 1 disorder (HCC)   . Migraine   . Suicide attempt by drug overdose (HCC) 02/15/2019    Past Surgical History:  Procedure Laterality Date  . NO PAST SURGERIES    . TENDON REPAIR Bilateral 02/20/2019   Procedure: TENDON REPAIR BILATERAL FOREARMS;  Surgeon: [name], MD;  Location: [facility];  Service: Orthopedics;  Laterality: Bilateral;  . WOUND EXPLORATION Bilateral 02/20/2019   Procedure: WOUND EXPLORATION;  Surgeon: [name], MD;  Location: [facility];  Service: Orthopedics;  Laterality: Bilateral;   Family History:  Family History  Problem Relation Age of Onset  . Atrial fibrillation Mother   . Bipolar disorder Paternal Uncle    Family Psychiatric  History: See admission H&P Social History:  Social History   Substance and Sexual Activity  Alcohol Use No     Social History   Substance and Sexual Activity  Drug Use No    Social History   Socioeconomic History  . Marital status: Single    Spouse name: Not on file  . Number of children: Not on file  . Years of education: Not  on file  . Highest education level: Not on file  Occupational History  . Not on file  Social Needs  . Financial resource strain: Not on file  . Food insecurity:    Worry: Not on file    Inability: Not on file  . Transportation needs:    Medical: Not on file    Non-medical: Not on file  Tobacco Use  . Smoking status: Never Smoker  . Smokeless tobacco: Never Used  Substance and Sexual Activity  . Alcohol use: No  . Drug  use: No  . Sexual activity: Not Currently  Lifestyle  . Physical activity:    Days per week: Not on file    Minutes per session: Not on file  . Stress: Not on file  Relationships  . Social connections:    Talks on phone: Not on file    Gets together: Not on file    Attends religious service: Not on file    Active member of club or organization: Not on file    Attends meetings of clubs or organizations: Not on file    Relationship status: Not on file  Other Topics Concern  . Not on file  Social History Narrative  . Not on file   Additional Social History:                         Sleep: Fair  Appetite:  Fair  Current Medications: Current Facility-Administered Medications  Medication Dose Route Frequency Provider Last Rate Last Dose  . ALPRAZolam Prudy Feeler) tablet 0.5 mg  0.5 mg Oral BID PRN Antonieta Pert, MD      . alum & mag hydroxide-simeth (MAALOX/MYLANTA) 200-200-20 MG/5ML suspension 30 mL  30 mL Oral Q4H PRN Antonieta Pert, MD      . HYDROcodone-acetaminophen (NORCO/VICODIN) 5-325 MG per tablet 1 tablet  1 tablet Oral Q6H PRN Cobos, Rockey Situ, MD   1 tablet at 02/23/19 7829   And  . HYDROcodone-acetaminophen (NORCO) 10-325 MG per tablet 1 tablet  1 tablet Oral Q6H PRN Cobos, Rockey Situ, MD   1 tablet at 02/23/19 0806  . hydrOXYzine (ATARAX/VISTARIL) tablet 25 mg  25 mg Oral Q6H PRN Antonieta Pert, MD   25 mg at 02/22/19 1854  . lamoTRIgine (LAMICTAL) tablet 150 mg  150 mg Oral BID Antonieta Pert, MD   150 mg at 02/23/19 0805  . Lurasidone HCl TABS 60 mg  60 mg Oral Q breakfast Antonieta Pert, MD   60 mg at 02/23/19 0804  . magnesium hydroxide (MILK OF MAGNESIA) suspension 30 mL  30 mL Oral Daily PRN Antonieta Pert, MD      . ondansetron (ZOFRAN-ODT) disintegrating tablet 4 mg  4 mg Oral Q8H PRN Antonieta Pert, MD   4 mg at 02/20/19 2000  . prazosin (MINIPRESS) capsule 2 mg  2 mg Oral QHS Antonieta Pert, MD   2 mg at 02/22/19 2203  .  sulfamethoxazole-trimethoprim (BACTRIM DS,SEPTRA DS) 800-160 MG per tablet 1 tablet  1 tablet Oral Q12H Antonieta Pert, MD   1 tablet at 02/23/19 0804  . topiramate (TOPAMAX) tablet 100 mg  100 mg Oral Daily Antonieta Pert, MD   100 mg at 02/23/19 0805  . traZODone (DESYREL) tablet 50 mg  50 mg Oral QHS PRN Antonieta Pert, MD   50 mg at 02/22/19 2201    Lab Results: No results found for this or any  previous visit (from the past 48 hour(s)).  Blood Alcohol level:  Lab Results  Component Value Date   ETH <10 02/15/2019    Metabolic Disorder Labs: Lab Results  Component Value Date   HGBA1C 5.6 02/19/2019   MPG 114.02 02/19/2019   No results found for: PROLACTIN Lab Results  Component Value Date   CHOL 191 02/19/2019   TRIG 123 02/19/2019   HDL 52 02/19/2019   CHOLHDL 3.7 02/19/2019   VLDL 25 02/19/2019   LDLCALC 114 (H) 02/19/2019   LDLCALC 101 (H) 05/25/2017    Physical Findings: AIMS: Facial and Oral Movements Muscles of Facial Expression: None, normal Lips and Perioral Area: None, normal Jaw: None, normal Tongue: None, normal,Extremity Movements Upper (arms, wrists, hands, fingers): None, normal Lower (legs, knees, ankles, toes): None, normal, Trunk Movements Neck, shoulders, hips: None, normal, Overall Severity Severity of abnormal movements (highest score from questions above): None, normal Incapacitation due to abnormal movements: None, normal Patient's awareness of abnormal movements (rate only patient's report): No Awareness, Dental Status Current problems with teeth and/or dentures?: No Does patient usually wear dentures?: No  CIWA:    COWS:     Musculoskeletal: Strength & Muscle Tone: within normal limits Gait & Station: normal Patient leans: N/A  Psychiatric Specialty Exam: Physical Exam  Nursing note and vitals reviewed. Constitutional: She is oriented to person, place, and time. She appears well-developed and well-nourished.  HENT:   Head: Normocephalic and atraumatic.  Respiratory: Effort normal.  Neurological: She is alert and oriented to person, place, and time.    ROS  Blood pressure 125/73, pulse (!) 107, temperature 98.1 F (36.7 C), temperature source Oral, resp. rate 16.There is no height or weight on file to calculate BMI.  General Appearance: Casual  Eye Contact:  Fair  Speech:  Normal Rate  Volume:  Normal  Mood:  Anxious  Affect:  Congruent  Thought Process:  Coherent and Descriptions of Associations: Intact  Orientation:  Full (Time, Place, and Person)  Thought Content:  Rumination  Suicidal Thoughts:  No  Homicidal Thoughts:  No  Memory:  Immediate;   Fair Recent;   Fair Remote;   Fair  Judgement:  Intact  Insight:  Fair  Psychomotor Activity:  Increased  Concentration:  Concentration: Fair and Attention Span: Fair  Recall:  [employer] of Knowledge:  Fair  Language:  Fair  Akathisia:  Negative  Handed:  Right  AIMS (if indicated):     Assets:  Desire for Improvement Financial Resources/Insurance Housing Physical Health Resilience  ADL's:  Intact  Cognition:  WNL  Sleep:  Number of Hours: 6.25     Treatment Plan Summary: Daily contact with patient to assess and evaluate symptoms and progress in treatment, Medication management and Plan : Patient is seen and examined.  Patient is a 38 year old female with the above-stated past psychiatric history who is seen in follow-up.   Diagnosis: #1 bipolar disorder, most recently depressed, #2 posttraumatic stress disorder, #3 reported history of attention deficit disorder.  Patient is seen and examined.  Patient is a 38 year old female with the above-stated past psychiatric history who is seen in follow-up.  She continues to slowly improve.  She is having some anxiety and residual depressive symptoms, especially with regard to going home.  She lives alone and is a little nervous about this.  We discussed her discharge planning which would  include either being discharged Sunday night or Monday morning with her follow-up with her psychiatrist on Monday.  She is in agreement with that.  She denied any side effects to her current medications.  We discussed coping skills and cognitive means of being able to cope with her current stressful situation.  She did state that the increased dosage of the narcotic pain medication has helped, and she is resting comfortably at night and her pain control is adequate.  No change to her current medications. 1.  Continue Xanax 0.5 mg p.o. twice daily as needed anxiety. 2.  Continue hydrocodone/APAP 10/325 1 to 2 tablets p.o. every 6 hours as needed pain. 3.  Continue hydroxyzine 25 mg p.o. every 6 hours as needed anxiety. 4.  Continue Lamictal 150 mg p.o. twice daily for mood stability. 5.  Continue Latuda 60 mg p.o. every morning for mood stability. 6.  Continue prazosin 2 mg p.o. nightly for nightmares and flashbacks. 7.  Continue Bactrim DS 1 tablet p.o. every 12 hours for a total of 7 days of therapy after her surgery. 8.  Continue Topamax 100 mg p.o. daily for migraine headache prevention. 9.  Continue trazodone 50 mg p.o. nightly as needed insomnia. 10.  Disposition planning-in progress.  Antonieta Pert, MD 02/23/2019, 11:11 AM

## 2019-02-23 NOTE — Progress Notes (Signed)
Follow up support with [facility] while rounding on unit.  She reports that she is hopeful to be going to [facility] following [facility] and feels comfortable in the plan to remain at [facility] for a few days while her wound care needs are higher.

## 2019-02-24 DIAGNOSIS — F314 Bipolar disorder, current episode depressed, severe, without psychotic features: Principal | ICD-10-CM

## 2019-02-24 MED ORDER — ALPRAZOLAM 0.5 MG PO TABS
0.5000 mg | ORAL_TABLET | Freq: Every day | ORAL | Status: DC | PRN
  Administered 2019-02-25: 0.5 mg via ORAL
  Filled 2019-02-24: qty 1

## 2019-02-24 NOTE — Progress Notes (Signed)
Nursing Progress Note: 7p-7a D: Pt currently presents with a anxious/pleasant affect and behavior. Pt states "I'm still feeling some minor pins and needles in my left thumb. I feel much better though. I have needed less pain medications as a result" Interacting appropriately with the milieu. Pt reports good sleep during the previous night with current medication regimen. Pt did attend wrap-up group.  A: Bandages and ROM, cap refill, and pulses assessed and WDL. Pt provided with medications per providers orders. Pt's labs and vitals were monitored throughout the night. Pt supported emotionally and encouraged to express concerns and questions. Pt educated on medications.  R: Pt's safety ensured with 15 minute and environmental checks. Pt currently denies SI, HI, and AVH. Pt verbally contracts to seek staff if SI,HI, or AVH occurs and to consult with staff before acting on any harmful thoughts. Will continue to monitor.
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
## 2019-02-24 NOTE — BHH Group Notes (Signed)
BHH/BM[facility] LCSW Group Therapy Note  Date/Time:  02/24/2019 10:15AM-11:15AM  Type of Therapy and Topic:  Group Therapy:  Feelings About Hospitalization  Participation Level:  Active   Description of Group This process group involved patients discussing their feelings related to being hospitalized, as well as the benefits they see to being in the hospital.  These feelings and benefits were itemized.  The group then brainstormed specific ways in which they could seek those same benefits when they discharge and return home.  This was related specifically to the current social distancing needs facing the country.  Therapeutic Goals 1. Patient will identify and describe positive and negative feelings related to hospitalization 2. Patient will verbalize benefits of hospitalization to themselves personally 3. Patients will brainstorm together ways they can obtain similar benefits in the outpatient setting, identify barriers to wellness and possible solutions  4. Patients will develop ideas together and separately for how to socially distance themselves while enhancing their mental health and avoiding isolation.  Summary of Patient Progress:  The patient expressed her primary feelings about being hospitalized are good because it has enabled her to stay focused and numb herself to the happenings in the outside world in relation to the news and the coronavirus.  She said she does not understand what is going on, which makes it hard to handle.  She would like to adjust/modify her coping skills in this new day and age, including doing things like playing Jenga to improve her final motor skills that were damaged in her suicide attempt.  This will enable her to return to painting and drawing in more detail.  Therapeutic Modalities Cognitive Behavioral Therapy Motivational Interviewing    Ambrose Mantle, LCSW 02/24/2019, 1:14 PM

## 2019-02-24 NOTE — Progress Notes (Signed)
[facility] MD Progress Note  02/24/2019 11:11 AM Shelby Gallegos  MRN:  [id number]   Subjective:  "Feeling alright."  7/10 depression, no suicidal ideations, anxiety is a 6-7/10 and 9/10 at night, sleep was fair related to her pain which is worse when lying down, appetite is fair.  Objective:  38 yo female admitted after attempting suicide by cutting both of her lower arms and overdosing on Xanax and Nyquil.  Past history of substance abuse but two years clean.  Patient seen and evaluated in her room.  Reports a high level of depression of 7/10 but denies suicidal ideations, glad to be alive at this time.  Continued to deny any reasons for her suicide attempts, "no idea" of any triggers.  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  She does recall a nightmare prior to the attempt but this is common for her.  Saw talking on the phone in the hall smiling and laughing with someone on the phone.  Anxiety is prevalent throughout the day and increases at night.  Her pain is controlled during the day but increases at night, disrupting her sleep.  Appetite is fair.  Denies adverse effects from medications.  Engaged easily in conversation and seems vested in her recovery.  Principal Problem: Bipolar affective disorder, depressed, severe (HCC) Diagnosis: Principal Problem:   Bipolar affective disorder, depressed, severe (HCC) Active Problems:   Borderline personality disorder (HCC)  Total Time spent with patient: 30 minutes  Past Psychiatric History: bipolar disorder, substance abuse  Past Medical History:  Past Medical History:  Diagnosis Date  . ADHD (attention deficit hyperactivity disorder)   . Bipolar 1 disorder (HCC)   . Migraine   . Suicide attempt by drug overdose (HCC) 02/15/2019    Past Surgical History:  Procedure Laterality Date  . NO PAST SURGERIES    . TENDON REPAIR Bilateral 02/20/2019   Procedure: TENDON REPAIR BILATERAL FOREARMS;  Surgeon: [name],  MD;  Location: [facility];  Service: Orthopedics;  Laterality: Bilateral;  . WOUND EXPLORATION Bilateral 02/20/2019   Procedure: WOUND EXPLORATION;  Surgeon: [name], MD;  Location: [facility];  Service: Orthopedics;  Laterality: Bilateral;   Family History:  Family History  Problem Relation Age of Onset  . Atrial fibrillation Mother   . Bipolar disorder Paternal Uncle    Family Psychiatric  History: uncle with bipolar d/o Social History:  Social History   Substance and Sexual Activity  Alcohol Use No     Social History   Substance and Sexual Activity  Drug Use No    Social History   Socioeconomic History  . Marital status: Single    Spouse name: Not on file  . Number of children: Not on file  . Years of education: Not on file  . Highest education level: Not on file  Occupational History  . Not on file  Social Needs  . Financial resource strain: Not on file  . Food insecurity:    Worry: Not on file    Inability: Not on file  . Transportation needs:    Medical: Not on file    Non-medical: Not on file  Tobacco Use  . Smoking status: Never Smoker  . Smokeless tobacco: Never Used  Substance and Sexual Activity  . Alcohol use: No  . Drug use: No  . Sexual activity: Not Currently  Lifestyle  . Physical activity:    Days per week: Not on file  Minutes per session: Not on file  . Stress: Not on file  Relationships  . Social connections:    Talks on phone: Not on file    Gets together: Not on file    Attends religious service: Not on file    Active member of club or organization: Not on file    Attends meetings of clubs or organizations: Not on file    Relationship status: Not on file  Other Topics Concern  . Not on file  Social History Narrative  . Not on file   Additional Social History:                         Sleep: Fair  Appetite:  Fair  Current Medications: Current Facility-Administered Medications   Medication Dose Route Frequency Provider Last Rate Last Dose  . ALPRAZolam Prudy Feeler) tablet 0.5 mg  0.5 mg Oral BID PRN Antonieta Pert, MD      . alum & mag hydroxide-simeth (MAALOX/MYLANTA) 200-200-20 MG/5ML suspension 30 mL  30 mL Oral Q4H PRN Antonieta Pert, MD      . HYDROcodone-acetaminophen (NORCO/VICODIN) 5-325 MG per tablet 1 tablet  1 tablet Oral Q6H PRN Cobos, Rockey Situ, MD   1 tablet at 02/24/19 1610   And  . HYDROcodone-acetaminophen (NORCO) 10-325 MG per tablet 1 tablet  1 tablet Oral Q6H PRN Cobos, Rockey Situ, MD   1 tablet at 02/23/19 1447  . hydrOXYzine (ATARAX/VISTARIL) tablet 25 mg  25 mg Oral Q6H PRN Antonieta Pert, MD   25 mg at 02/23/19 2113  . lamoTRIgine (LAMICTAL) tablet 150 mg  150 mg Oral BID Antonieta Pert, MD   150 mg at 02/24/19 9604  . Lurasidone HCl TABS 60 mg  60 mg Oral Q breakfast Antonieta Pert, MD   60 mg at 02/24/19 5409  . magnesium hydroxide (MILK OF MAGNESIA) suspension 30 mL  30 mL Oral Daily PRN Antonieta Pert, MD      . ondansetron (ZOFRAN-ODT) disintegrating tablet 4 mg  4 mg Oral Q8H PRN Antonieta Pert, MD   4 mg at 02/20/19 2000  . prazosin (MINIPRESS) capsule 2 mg  2 mg Oral QHS Antonieta Pert, MD   2 mg at 02/23/19 2112  . sulfamethoxazole-trimethoprim (BACTRIM DS,SEPTRA DS) 800-160 MG per tablet 1 tablet  1 tablet Oral Q12H Antonieta Pert, MD   1 tablet at 02/24/19 [phone]  . topiramate (TOPAMAX) tablet 100 mg  100 mg Oral Daily Antonieta Pert, MD   100 mg at 02/24/19 1478  . traZODone (DESYREL) tablet 50 mg  50 mg Oral QHS PRN Antonieta Pert, MD   50 mg at 02/23/19 2113    Lab Results: No results found for this or any previous visit (from the past 48 hour(s)).  Blood Alcohol level:  Lab Results  Component Value Date   ETH <10 02/15/2019    Metabolic Disorder Labs: Lab Results  Component Value Date   HGBA1C 5.6 02/19/2019   MPG 114.02 02/19/2019   No results found for: PROLACTIN Lab Results   Component Value Date   CHOL 191 02/19/2019   TRIG 123 02/19/2019   HDL 52 02/19/2019   CHOLHDL 3.7 02/19/2019   VLDL 25 02/19/2019   LDLCALC 114 (H) 02/19/2019   LDLCALC 101 (H) 05/25/2017    Physical Findings: AIMS: Facial and Oral Movements Muscles of Facial Expression: None, normal Lips and Perioral Area: None, normal Jaw: None,  normal Tongue: None, normal,Extremity Movements Upper (arms, wrists, hands, fingers): None, normal Lower (legs, knees, ankles, toes): None, normal, Trunk Movements Neck, shoulders, hips: None, normal, Overall Severity Severity of abnormal movements (highest score from questions above): None, normal Incapacitation due to abnormal movements: None, normal Patient's awareness of abnormal movements (rate only patient's report): No Awareness, Dental Status Current problems with teeth and/or dentures?: No Does patient usually wear dentures?: No  CIWA:    COWS:     Musculoskeletal: Strength & Muscle Tone: within normal limits Gait & Station: normal Patient leans: N/A  Psychiatric Specialty Exam: Physical Exam  Nursing note and vitals reviewed. Constitutional: She is oriented to person, place, and time. She appears well-developed and well-nourished.  HENT:  Head: Normocephalic.  Neck: Normal range of motion.  Respiratory: Effort normal.  Neurological: She is alert and oriented to person, place, and time.  Psychiatric: Her speech is normal and behavior is normal. Thought content normal. Her mood appears anxious. Cognition and memory are normal. She expresses impulsivity. She exhibits a depressed mood.    Review of Systems  Constitutional: Negative.   HENT: Negative.   Eyes: Negative.   Cardiovascular: Negative.   Gastrointestinal: Negative.   Genitourinary: Negative.   Musculoskeletal:       Bilateral lower arm pain related to injury/surgery  Skin: Negative.   Neurological: Negative.   Endo/Heme/Allergies: Negative.   Psychiatric/Behavioral:  Positive for depression. The patient is nervous/anxious.     Blood pressure 132/84, pulse 89, temperature 98.1 F (36.7 C), temperature source Oral, resp. rate 16.There is no height or weight on file to calculate BMI.  General Appearance: Casual  Eye Contact:  Fair  Speech:  Normal Rate  Volume:  Decreased  Mood:  Anxious and Depressed  Affect:  Blunt  Thought Process:  Coherent and Descriptions of Associations: Intact  Orientation:  Full (Time, Place, and Person)  Thought Content:  Rumination  Suicidal Thoughts:  No  Homicidal Thoughts:  No  Memory:  Immediate;   Fair Recent;   Fair Remote;   Fair  Judgement:  Poor  Insight:  Fair to pooer  Psychomotor Activity:  Decreased  Concentration:  Concentration: Fair and Attention Span: Fair  Recall:  [employer] of Knowledge:  Fair  Language:  Good  Akathisia:  No  Handed:  Right  AIMS (if indicated):     Assets:  Housing Leisure Time Physical Health Resilience Social Support  ADL's:  Intact  Cognition:  WNL  Sleep:  Number of Hours: 6.5    Treatment Plan Summary: Daily contact with patient to assess and evaluate symptoms and progress in treatment, Medication management and Plan bipolar affective disorder, depressed, severe without psychosis:  -Continued Lamictal 150 mg BID -Continued Latuda 60 mg daily -Continued Topamax 100 mg daily  Anxiety: -Continue hydroxyzine 25 mg every six hours PRN -Decreased Xanax 0.5 mg BID PRN to daily PRN  Pain and infection prevention: -Continued Vicodin every six hours PRN -Continued Bactrim every 12 hours  Nightmares: -Continued Prazosin 2 mg at bedtime  Insomnia: -Discontinued Trazodone 50 mg PRN due to increase in nightmares with use -Encouraged use of hydroxyzine  1. Will maintain Q 15 minutes observation for safety. Estimated LOS: 5-7 days 2. Patient will participate in group, milieu, and individual therapy.Psychotherapy: Social and Doctor, hospital,  learning based strategies, cognitive behavioral, and family object relations individuation separation intervention psychotherapies can be considered.  3. Will continue to monitor patient's mood and behavior. 4. Social Work involvement to  discuss discharge and concerns will also be addressed: Safety, stabilization, and access to medication 5. Discharge date TBD  Nanine Means, NP 02/24/2019, 11:11 AM

## 2019-02-24 NOTE — Progress Notes (Signed)
DAR NOTE: Patient presents with bright affect and calm mood. Pt stated she has had a good day, her sleep has improved and pain is being managed well with NORCO. Pt stated she is looking forward to being discharged on Monday. Denies auditory and visual hallucinations.  Rates depression at 7, hopelessness at 6, and anxiety at 8.  Maintained on routine safety checks.  Medications given as prescribed.  Support and encouragement offered as needed.  Attended group and participated.  States goal for today is "work on my discharge plan.'  Patient observed socializing with peers in the dayroom. Will continue to monitor.

## 2019-02-25 DIAGNOSIS — T424X2A Poisoning by benzodiazepines, intentional self-harm, initial encounter: Secondary | ICD-10-CM

## 2019-02-25 DIAGNOSIS — F603 Borderline personality disorder: Secondary | ICD-10-CM

## 2019-02-25 DIAGNOSIS — R450 Nervousness: Secondary | ICD-10-CM

## 2019-02-25 MED ORDER — OXCARBAZEPINE 150 MG PO TABS
150.0000 mg | ORAL_TABLET | Freq: Every day | ORAL | Status: DC
  Administered 2019-02-25: 150 mg via ORAL
  Filled 2019-02-25 (×2): qty 1

## 2019-02-25 NOTE — [facility] Group Notes (Signed)
[facility] LCSW Group Therapy Note  02/25/2019   10:00-11:00AM  Type of Therapy and Topic:  Group Therapy:  Unhealthy versus Healthy Supports, Which Am I?  Participation Level:  Active   Description of Group:  Patients in this group were introduced to the concept that additional supports including self-support are an essential part of recovery.  Initially a discussion was held about the differences between healthy versus unhealthy supports.  Patients were asked to share what unhealthy supports in their lives need to be addressed, as well as what additional healthy supports could be added for greater help in reaching their goals.   A song entitled "My Own Hero" was played and a group discussion ensued in which patients stated they could relate to the song and it inspired them to realize they have be willing to help themselves in order to succeed, because other people cannot achieve sobriety or stability for them.  We discussed adding a variety of healthy supports to address the various needs in patient lives, including becoming more self-supportive.  Therapeutic Goals: 1)  Highlight the differences between healthy and unhealthy supports 2)  Suggest the importance of being a part of one's own support system 2)  Discuss reasons people in one's life may eventually be unable to be continually supportive  3)  Identify the patient's current support system and   4)  elicit commitments to add healthy supports and to become more conscious of being self-supportive   Summary of Patient Progress:  The patient expressed that the unhealthy support which needs to be addressed includes her family not trying to understand her mental illness, even though they indicate a willingness to do so.  Healthy supports which could be added for increased stability and happiness include helping her family members to understand her mental illness better, and instituting some healthier boundaries if they are unable to achieve this  understanding.  Therapeutic Modalities:   Motivational Interviewing Activity  Lynnell Chad

## 2019-02-25 NOTE — Progress Notes (Addendum)
[facility] MD Progress Note  02/25/2019 9:30 AM Shelby Gallegos  MRN:  [id number]   Subjective:  " I am feeling okay, I guess"  Evaluation:  Shelby Gallegos observed sitting in day room interacting with peers.  She presents pleasant, calm and cooperative.  Currently denying suicidal or homicidal ideations.  Denies auditory or visual hallucinations.  Patient recanted the story of her suicide attempt as she reports unknown stressors related to her attempt.  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  She reports feeling better overall and is hopeful to discharge soon.  States she has been attending daily group sessions with active and engaged participation.  Reports plans to follow-up with partial hospitalization after discharge.  Shelby Gallegos reports taking medications as prescribed and tolerating them well.  Denies depression or depressive symptoms. Md initiated Trileptal 150 mg for mood stabilization. States her suicide attempt was impulsive. Support encouragement reassurance was provided.   History: Noted for H&P:-38 year old female. Attempted suicide 3/19 by overdosing on prescribed Alprazolam And on OTC sleeping medication ( Nyquil) . States she took " a whole bottle of Xanax). Following overdose she also cut herself on both forearms resulting in significant blood loss,needing multiple staples. States a friend contacted 911 and was brought to ED. States suicide attempt had been beenformed/planned earlier that day and that at the time " I felt really at peace with it". She cannot identify any specific triggers that may have exacerbated her depression or suicidal ideations. 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 Endorseshistory of worsening  depression and endorses neuro vegetative symptomsas below, denies hallucinations    Principal Problem: Bipolar affective disorder, depressed, severe (HCC) Diagnosis: Principal Problem:   Bipolar affective disorder, depressed, severe (HCC) Active Problems:   Borderline personality disorder (HCC)  Total Time spent with patient: 30 minutes  Past Psychiatric History: bipolar disorder, substance abuse  Past Medical History:  Past Medical History:  Diagnosis Date  . ADHD (attention deficit hyperactivity disorder)   . Bipolar 1 disorder (HCC)   . Migraine   . Suicide attempt by drug overdose (HCC) 02/15/2019    Past Surgical History:  Procedure Laterality Date  . NO PAST SURGERIES    . TENDON REPAIR Bilateral 02/20/2019   Procedure: TENDON REPAIR BILATERAL FOREARMS;  Surgeon: [name], MD;  Location: [facility];  Service: Orthopedics;  Laterality: Bilateral;  . WOUND EXPLORATION Bilateral 02/20/2019   Procedure: WOUND EXPLORATION;  Surgeon: [name], MD;  Location: [facility];  Service: Orthopedics;  Laterality: Bilateral;   Family History:  Family History  Problem Relation Age of Onset  . Atrial fibrillation Mother   . Bipolar disorder Paternal Uncle    Family Psychiatric  History: uncle with bipolar d/o Social History:  Social History   Substance and Sexual Activity  Alcohol Use No     Social History   Substance and Sexual Activity  Drug Use No    Social History   Socioeconomic History  . Marital status: Single    Spouse name: Not on file  . Number of children: Not on file  . Years of education: Not on file  . Highest education level: Not on file  Occupational History  .  Not on file  Social Needs  . Financial resource strain: Not on file  . Food insecurity:    Worry: Not on file    Inability: Not on file  . Transportation needs:    Medical: Not on file    Non-medical: Not on file  Tobacco Use  . Smoking status: Never  Smoker  . Smokeless tobacco: Never Used  Substance and Sexual Activity  . Alcohol use: No  . Drug use: No  . Sexual activity: Not Currently  Lifestyle  . Physical activity:    Days per week: Not on file    Minutes per session: Not on file  . Stress: Not on file  Relationships  . Social connections:    Talks on phone: Not on file    Gets together: Not on file    Attends religious service: Not on file    Active member of club or organization: Not on file    Attends meetings of clubs or organizations: Not on file    Relationship status: Not on file  Other Topics Concern  . Not on file  Social History Narrative  . Not on file   Additional Social History:                         Sleep: Fair  Appetite:  Fair  Current Medications: Current Facility-Administered Medications  Medication Dose Route Frequency Provider Last Rate Last Dose  . ALPRAZolam Prudy Feeler) tablet 0.5 mg  0.5 mg Oral Daily PRN Charm Rings, NP      . alum & mag hydroxide-simeth (MAALOX/MYLANTA) 200-200-20 MG/5ML suspension 30 mL  30 mL Oral Q4H PRN Antonieta Pert, MD      . HYDROcodone-acetaminophen (NORCO/VICODIN) 5-325 MG per tablet 1 tablet  1 tablet Oral Q6H PRN Cobos, Rockey Situ, MD   1 tablet at 02/25/19 8119   And  . HYDROcodone-acetaminophen (NORCO) 10-325 MG per tablet 1 tablet  1 tablet Oral Q6H PRN Cobos, Rockey Situ, MD   1 tablet at 02/24/19 1231  . hydrOXYzine (ATARAX/VISTARIL) tablet 25 mg  25 mg Oral Q6H PRN Antonieta Pert, MD   25 mg at 02/24/19 2209  . lamoTRIgine (LAMICTAL) tablet 150 mg  150 mg Oral BID Antonieta Pert, MD   150 mg at 02/25/19 1478  . Lurasidone HCl TABS 60 mg  60 mg Oral Q breakfast Antonieta Pert, MD   60 mg at 02/25/19 2956  . magnesium hydroxide (MILK OF MAGNESIA) suspension 30 mL  30 mL Oral Daily PRN Antonieta Pert, MD      . ondansetron (ZOFRAN-ODT) disintegrating tablet 4 mg  4 mg Oral Q8H PRN Antonieta Pert, MD   4 mg at 02/20/19 2000   . prazosin (MINIPRESS) capsule 2 mg  2 mg Oral QHS Antonieta Pert, MD   2 mg at 02/24/19 2209  . sulfamethoxazole-trimethoprim (BACTRIM DS,SEPTRA DS) 800-160 MG per tablet 1 tablet  1 tablet Oral Q12H Antonieta Pert, MD   1 tablet at 02/25/19 [phone]  . topiramate (TOPAMAX) tablet 100 mg  100 mg Oral Daily Antonieta Pert, MD   100 mg at 02/25/19 8657    Lab Results: No results found for this or any previous visit (from the past 48 hour(s)).  Blood Alcohol level:  Lab Results  Component Value Date   ETH <10 02/15/2019    Metabolic Disorder Labs: Lab Results  Component Value Date   HGBA1C 5.6  02/19/2019   MPG 114.02 02/19/2019   No results found for: PROLACTIN Lab Results  Component Value Date   CHOL 191 02/19/2019   TRIG 123 02/19/2019   HDL 52 02/19/2019   CHOLHDL 3.7 02/19/2019   VLDL 25 02/19/2019   LDLCALC 114 (H) 02/19/2019   LDLCALC 101 (H) 05/25/2017    Physical Findings: AIMS: Facial and Oral Movements Muscles of Facial Expression: None, normal Lips and Perioral Area: None, normal Jaw: None, normal Tongue: None, normal,Extremity Movements Upper (arms, wrists, hands, fingers): None, normal Lower (legs, knees, ankles, toes): None, normal, Trunk Movements Neck, shoulders, hips: None, normal, Overall Severity Severity of abnormal movements (highest score from questions above): None, normal Incapacitation due to abnormal movements: None, normal Patient's awareness of abnormal movements (rate only patient's report): No Awareness, Dental Status Current problems with teeth and/or dentures?: No Does patient usually wear dentures?: No  CIWA:    COWS:     Musculoskeletal: Strength & Muscle Tone: within normal limits Gait & Station: normal Patient leans: N/A  Psychiatric Specialty Exam: Physical Exam  Nursing note and vitals reviewed. Constitutional: She is oriented to person, place, and time. She appears well-developed and well-nourished.  HENT:  Head:  Normocephalic.  Neck: Normal range of motion.  Respiratory: Effort normal.  Neurological: She is alert and oriented to person, place, and time.  Psychiatric: Her speech is normal and behavior is normal. Thought content normal. Her mood appears anxious. Cognition and memory are normal. She expresses impulsivity. She exhibits a depressed mood.    Review of Systems  Musculoskeletal:       Bilateral lower arm pain related to injury/surgery  Psychiatric/Behavioral: Positive for depression. The patient is nervous/anxious.   All other systems reviewed and are negative.   Blood pressure 112/79, pulse (!) 108, temperature 98.6 F (37 C), resp. rate 16.There is no height or weight on file to calculate BMI.  General Appearance: Casual  Eye Contact:  Fair  Speech:  Normal Rate  Volume:  Normal  Mood:  Anxious and Depressed  Affect:  Blunt  Thought Process:  Coherent and Descriptions of Associations: Intact  Orientation:  Full (Time, Place, and Person)  Thought Content:  Rumination  Suicidal Thoughts:  No  Homicidal Thoughts:  No  Memory:  Immediate;   Fair Recent;   Fair Remote;   Fair  Judgement:  Poor  Insight:  Lacking   Psychomotor Activity:  Normal  Concentration:  Concentration: Fair and Attention Span: Fair  Recall:  [employer] of Knowledge:  Fair  Language:  Good  Akathisia:  No  Handed:  Right  AIMS (if indicated):     Assets:  Housing Leisure Time Physical Health Resilience Social Support  ADL's:  Intact  Cognition:  WNL  Sleep:  Number of Hours: 6    Treatment Plan Summary: Daily contact with patient to assess and evaluate symptoms and progress in treatment and Medication management   Continue with current treatment plan on 02/25/2019 as listed below except where noted  Bipolar affective:    - Start Trileptal 150 mg po daily  -Continued Lamictal 150 mg BID -Continued Latuda 60 mg daily -Continued Topamax 100 mg daily  Anxiety: -Continue hydroxyzine 25 mg  every six hours PRN -Decreased Xanax 0.5 mg BID PRN to daily PRN  Pain and infection prevention: -Continued Vicodin every six hours PRN -Continued Bactrim every 12 hours  Nightmares: -Continued Prazosin 2 mg at bedtime  Insomnia: -Discontinued Trazodone 50 mg PRN due to increase  in nightmares with use -Encouraged use of hydroxyzine  CSW to continue working on discharge disposition Patient encouraged to participate within the therapeutic milieu  Oneta Rack, NP 02/25/2019, 9:30 AM

## 2019-02-25 NOTE — Progress Notes (Signed)
Pt presents with a flat affect and an anxious mood. Pt rated on her self inventory sheet depression 6/10, anxiety 8/10, and hopelessness 6/10.  Pt reported fair sleep last night. Pt denied SI/HI. Pt stated goal "discharge plan-moving forward." Pt expressed readiness for discharge home tomorrow. Pt stated that she keeps in contact with a peer support specialist from the mental health association which is helpful.                                                                                                                                                                                                                                                                                                                                                                                                                  Medications reviewed with pt. Verbal support provided. Pt encouraged to attend groups. 15 minute checks performed for safety. Capillary refill assessed in bilat fingers and noted to be less than 2 sec. ROM performed and noted to be WNL in the right hand. Pt noted and reported limited movement to Left thumb and c/o tingly and needle sensation. MD notified and is aware. Compression wraps noted to be intact to bilat forearms.   Pt compliant with tx plan.

## 2019-02-26 MED ORDER — LURASIDONE HCL 60 MG PO TABS: 60.0000 mg | ORAL_TABLET | Freq: Every day | ORAL | 0 refills | Status: DC

## 2019-02-26 MED ORDER — OXCARBAZEPINE 150 MG PO TABS: 150.0000 mg | ORAL_TABLET | Freq: Every day | ORAL | 0 refills | Status: DC

## 2019-02-26 NOTE — Progress Notes (Signed)
Discharge note: Patient reviewed discharge paperwork with RN including prescriptions, follow up appointments, and lab work. Patient given the opportunity to ask questions. All concerns were addressed. All belongings were returned to patient. Denied SI/HI/AVH. Patient thanked staff for their care while at the hospital.  Patient was discharged to lobby where her parents were waiting to pick her up.

## 2019-02-26 NOTE — Discharge Summary (Signed)
Physician Discharge Summary Note  Patient:  Shelby Gallegos is an 37 y.o., female MRN:  [id number] DOB:  [birth year] Patient phone:  [phone] (home)  Patient address:   [address] [place] [address],  Total Time spent with patient: 15 minutes  Date of Admission:  02/20/2019 Date of Discharge: 02/26/2019  Reason for Admission: Borderline personality disorder   Per assessment note: 37 year old female. Attempted suicide 3/19 by overdosing on prescribed Alprazolam And on OTC sleeping medication ( Nyquil) . States she took " a whole bottle of Xanax). Following overdose she also cut herself on both forearms resulting in significant blood loss,needing multiple staples. States a friend contacted 911 and was brought to ED. States suicide attempt had been beenformed/planned earlier that day and that at the time " I felt really at peace with it". She cannot identify any specific triggers that may have exacerbated her depression or suicidal ideations. 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 Endorseshistory of worsening depression and endorses neuro vegetative symptomsas below, denies hallucinations.Shelby Gallegos was admitted on 02/17/2019 after suicide attempt via cutting wrists and overdose on Xanax and Nyquil. She left the facility on 02/20/2019 with MHT sitter to day surgery center for exploratory surgery for tendon related to recent cutting episode. She returned to [facility] 02/20/2019 for continued psychiatric treatment after surgery. On assessment today, she reports continuing depression but denies current SI. Nursing reports patient was having suicidal thoughts last night and patient again expressed regret that suicide attempt was not successful. Patient does acknowledge suicidal thoughts last night and says it  was largely related to severe pain in her arms after surgery. Today she reports feeling "indifferent" about being alive. She reports feeling purposeless and anxious about her future. She agrees to let staff member know if suicidal thoughts return. Denies AVH/HI.  Principal Problem: Bipolar affective disorder, depressed, severe (HCC) Discharge Diagnoses: Principal Problem:   Bipolar affective disorder, depressed, severe (HCC) Active Problems:   Borderline personality disorder [name])   Past Psychiatric History:   Past Medical History:  Past Medical History:  Diagnosis Date  . ADHD (attention deficit hyperactivity disorder)   . Bipolar 1 disorder (HCC)   . Migraine   . Suicide attempt by drug overdose (HCC) 02/15/2019    Past Surgical History:  Procedure Laterality Date  . NO PAST SURGERIES    . TENDON REPAIR Bilateral 02/20/2019   Procedure: TENDON REPAIR BILATERAL FOREARMS;  Surgeon: [name], MD;  Location: [facility];  Service: Orthopedics;  Laterality: Bilateral;  . WOUND EXPLORATION Bilateral 02/20/2019   Procedure: WOUND EXPLORATION;  Surgeon: [name], MD;  Location: [facility];  Service: Orthopedics;  Laterality: Bilateral;   Family History:  Family History  Problem Relation Age of Onset  . Atrial fibrillation Mother   . Bipolar disorder Paternal Uncle    Family Psychiatric  History:  Social History:  Social History   Substance and Sexual Activity  Alcohol Use No     Social History   Substance and Sexual Activity  Drug Use No    Social History   Socioeconomic History  . Marital status: Single    Spouse name: Not on file  . Number of children: Not on file  . Years of education: Not on file  .  Highest education level: Not on file  Occupational History  . Not on file  Social Needs  . Financial resource strain: Not on file  . Food insecurity:    Worry: Not on file    Inability: Not on file  . Transportation needs:     Medical: Not on file    Non-medical: Not on file  Tobacco Use  . Smoking status: Never Smoker  . Smokeless tobacco: Never Used  Substance and Sexual Activity  . Alcohol use: No  . Drug use: No  . Sexual activity: Not Currently  Lifestyle  . Physical activity:    Days per week: Not on file    Minutes per session: Not on file  . Stress: Not on file  Relationships  . Social connections:    Talks on phone: Not on file    Gets together: Not on file    Attends religious service: Not on file    Active member of club or organization: Not on file    Attends meetings of clubs or organizations: Not on file    Relationship status: Not on file  Other Topics Concern  . Not on file  Social History Narrative  . Not on file    Hospital Course:  Shelby Gallegos was admitted for Bipolar affective disorder, depressed, severe (HCC)  and crisis management.  Pt was treated discharged with the medications listed below under Medication List.  Medical problems were identified and treated as needed.  Home medications were restarted as appropriate.  Improvement was monitored by observation and Shelby Gallegos 's daily report of symptom reduction.  Emotional and mental status was monitored by daily self-inventory reports completed by Shelby Gallegos and clinical staff.         Shelby Gallegos was evaluated by the treatment team for stability and plans for continued recovery upon discharge. Shelby Gallegos 's motivation was an integral factor for scheduling further treatment. Employment, transportation, bed availability, health status, family support, and any pending legal issues were also considered during hospital stay. Pt was offered further treatment options upon discharge including but not limited to Residential, Intensive Outpatient, and Outpatient treatment.  Shelby Gallegos will follow up with the services as listed below under Follow Up Information.     Upon completion of this admission the patient was both mentally and  medically stable for discharge denying suicidal/homicidal ideation, auditory/visual/tactile hallucinations, delusional thoughts and paranoia.    Shelby Gallegos responded well to treatment with Trileptal 150 mg, Topamax 100 mg, Lamictal 150 mg and Lurasidone 60 mg without adverse effects. Pt demonstrated improvement without reported or observed adverse effects to the point of stability appropriate for outpatient management. Pertinent labs include: Lipid, T4, T3 and TSH and CBC for which outpatient follow-up is necessary for lab recheck as mentioned below. Reviewed CBC, CMP, BAL, and UDS; all unremarkable aside from noted exceptions.   Physical Findings: AIMS: Facial and Oral Movements Muscles of Facial Expression: None, normal Lips and Perioral Area: None, normal Jaw: None, normal Tongue: None, normal,Extremity Movements Upper (arms, wrists, hands, fingers): None, normal Lower (legs, knees, ankles, toes): None, normal, Trunk Movements Neck, shoulders, hips: None, normal, Overall Severity Severity of abnormal movements (highest score from questions above): None, normal Incapacitation due to abnormal movements: None, normal Patient's awareness of abnormal movements (rate only patient's report): No Awareness, Dental Status Current problems with teeth and/or dentures?: No Does patient usually wear dentures?: No  CIWA:    COWS:     Musculoskeletal: Strength & Muscle  Tone: within normal limits Gait & Station: normal Patient leans: N/A  Psychiatric Specialty Exam: See SRA by MD  Physical Exam  ROS  Blood pressure 124/76, pulse (!) 113, temperature 98 F (36.7 C), temperature source Oral, resp. rate 16.There is no height or weight on file to calculate BMI.      Has this patient used any form of tobacco in the last 30 days? (Cigarettes, Smokeless Tobacco, Cigars, and/or Pipes)  No  Blood Alcohol level:  Lab Results  Component Value Date   ETH <10 02/15/2019    Metabolic Disorder Labs:   Lab Results  Component Value Date   HGBA1C 5.6 02/19/2019   MPG 114.02 02/19/2019   No results found for: PROLACTIN Lab Results  Component Value Date   CHOL 191 02/19/2019   TRIG 123 02/19/2019   HDL 52 02/19/2019   CHOLHDL 3.7 02/19/2019   VLDL 25 02/19/2019   LDLCALC 114 (H) 02/19/2019   LDLCALC 101 (H) 05/25/2017    See Psychiatric Specialty Exam and Suicide Risk Assessment completed by Attending Physician prior to discharge.  Discharge destination:  Home  Is patient on multiple antipsychotic therapies at discharge:  No   Has Patient had three or more failed trials of antipsychotic monotherapy by history:  No  Recommended Plan for Multiple Antipsychotic Therapies: NA  Discharge Instructions    Diet - low sodium heart healthy   Complete by:  As directed    Discharge instructions   Complete by:  As directed    Take all medications as prescribed. Keep all follow-up appointments as scheduled.  Do not consume alcohol or use illegal drugs while on prescription medications. Report any adverse effects from your medications to your primary care provider promptly.  In the event of recurrent symptoms or worsening symptoms, call 911, a crisis hotline, or go to the nearest emergency department for evaluation.   Increase activity slowly   Complete by:  As directed      Allergies as of 02/26/2019   No Known Allergies     Medication List    STOP taking these medications   Adderall 20 MG tablet Generic drug:  amphetamine-dextroamphetamine   ALPRAZolam 0.5 MG tablet Commonly known as:  XANAX   SUMAtriptan 50 MG tablet Commonly known as:  IMITREX     TAKE these medications     Indication  Aimovig (140 MG Dose) 70 MG/ML Soaj Generic drug:  Erenumab-aooe Inject 70 mg into the skin every 30 (thirty) days.  Indication:  Migraine Headache   HYDROcodone-acetaminophen 5-325 MG tablet Commonly known as:  Norco 1-2 tabs po q6 hours prn pain  Indication:  Pain    lamoTRIgine 150 MG tablet Commonly known as:  LAMICTAL Take 150 mg by mouth 2 (two) times daily.  Indication:  Mood   lurasidone 40 MG Tabs tablet Commonly known as:  LATUDA Take 1 tablet (40 mg total) by mouth daily with breakfast.  Indication:  Mood   Lurasidone HCl 60 MG Tabs Take 1 tablet (60 mg total) by mouth daily with breakfast. Start taking on:  February 27, 2019  Indication:  Depressive Phase of Manic-Depression   OXcarbazepine 150 MG tablet Commonly known as:  TRILEPTAL Take 1 tablet (150 mg total) by mouth at bedtime.  Indication:  Diabetes with Nerve Disease   prazosin 2 MG capsule Commonly known as:  MINIPRESS Take 2-4 mg by mouth at bedtime.  Indication:  Frightening Dreams   sulfamethoxazole-trimethoprim 800-160 MG tablet Commonly known as:  Bactrim  DS Take 1 tablet by mouth 2 (two) times daily.  Indication:  Infection   topiramate 100 MG tablet Commonly known as:  TOPAMAX  Indication:  Migraine Headache      Follow-up Information    Partial Hospitalization Program Follow up on 02/27/2019.   Why:  Please attend your CCA appointment on Tuesday, 3/31 at 10:00a.  Be sure to bring your photo ID and proof of insurance.  Contact information: [address] [place] [address] Phone: [phone] Fax: [phone]       Milagros Evener, MD Follow up on 02/26/2019.   Specialty:  Psychiatry Why:  Medication management appointment is Monday, 3/30 at 2:45p.  Please bring your current mediucations and discharge paperwork from this hospitalization.  Contact information: [address]yson [address] [place] [address] [phone]        [facility] Follow up.   Why:  Please call and schedule a therapy appointment with agency.  Contact information: [address] [place] [address] [phone]           Follow-up recommendations:  Activity:  as tolerated  Diet:  Heart healthy    Comments:    Take all medications as prescribed. Keep all follow-up appointments as scheduled.  Do not consume alcohol or use illegal drugs while on prescription medications. Report any adverse effects from your medications to your primary care provider promptly.  In the event of recurrent symptoms or worsening symptoms, call 911, a crisis hotline, or go to the nearest emergency department for evaluation.   Signed: Oneta Rack, NP 02/26/2019, 8:26 AM

## 2019-02-26 NOTE — Plan of Care (Signed)
Patient denies SI HI AVH. Says she is ready for discharge around lunchtime. Her parents will come pick her up.  Problem: Education: Goal: Knowledge of [facility] Education information/materials will improve Outcome: Adequate for Discharge   Problem: Education: Goal: Emotional status will improve Outcome: Adequate for Discharge   Problem: Education: Goal: Mental status will improve Outcome: Adequate for Discharge   Problem: Education: Goal: Verbalization of understanding the information provided will improve Outcome: Adequate for Discharge

## 2019-02-26 NOTE — BHH Suicide Risk Assessment (Signed)
[facility] Discharge Suicide Risk Assessment   Principal Problem: Bipolar affective disorder, depressed, severe (HCC) Discharge Diagnoses: Principal Problem:   Bipolar affective disorder, depressed, severe (HCC) Active Problems:   Borderline personality disorder (HCC)   Total Time spent with patient: 15 minutes  Musculoskeletal: Strength & Muscle Tone: within normal limits Gait & Station: normal Patient leans: N/A  Psychiatric Specialty Exam: Review of Systems  All other systems reviewed and are negative.   Blood pressure 124/76, pulse (!) 113, temperature 98 F (36.7 C), temperature source Oral, resp. rate 16.There is no height or weight on file to calculate BMI.  General Appearance: Casual  Eye Contact::  Good  Speech:  Normal Rate409  Volume:  Normal  Mood:  Euthymic  Affect:  Congruent  Thought Process:  Coherent and Descriptions of Associations: Intact  Orientation:  Full (Time, Place, and Person)  Thought Content:  Logical  Suicidal Thoughts:  No  Homicidal Thoughts:  No  Memory:  Immediate;   Fair Recent;   Fair Remote;   Fair  Judgement:  Intact  Insight:  Fair  Psychomotor Activity:  Normal  Concentration:  Fair  Recall:  [employer] of Knowledge:Fair  Language: Fair  Akathisia:  Negative  Handed:  Right  AIMS (if indicated):     Assets:  Communication Skills Desire for Improvement Housing Leisure Time Physical Health Resilience  Sleep:  Number of Hours: 6.75  Cognition: WNL  ADL's:  Intact   Mental Status Per Nursing Assessment::   On Admission:     Demographic Factors:  Caucasian and Unemployed  Loss Factors: NA  Historical Factors: Impulsivity  Risk Reduction Factors:   Positive therapeutic relationship  Continued Clinical Symptoms:  Bipolar Disorder:   Mixed State  Cognitive Features That Contribute To Risk:  None    Suicide Risk:  Minimal: No identifiable suicidal ideation.  Patients presenting with no risk factors but with morbid  ruminations; may be classified as minimal risk based on the severity of the depressive symptoms  Follow-up Information    Partial Hospitalization Program Follow up on 02/27/2019.   Why:  Please attend your CCA appointment on Tuesday, 3/31 at 10:00a.  Be sure to bring your photo ID and proof of insurance.  Contact information: [address] [place] [address] Phone: [phone] Fax: [phone]       Milagros Evener, MD Follow up on 02/26/2019.   Specialty:  Psychiatry Why:  Medication management appointment is Monday, 3/30 at 2:45p.  Please bring your current mediucations and discharge paperwork from this hospitalization.  Contact information: [address] [place] [address] [phone]        [facility] Follow up.   Why:  Please call and schedule a therapy appointment with agency.  Contact information: [address] [place] [address] [phone]           Plan Of Care/Follow-up recommendations:  Activity:  AD LIB  Antonieta Pert, MD 02/26/2019, 7:39 AM

## 2019-02-26 NOTE — Progress Notes (Signed)
  [facility] Adult Case Management Discharge Plan :  Will you be returning to the same living situation after discharge:  Yes,  patient reports she is returning home At discharge, do you have transportation home?: Yes,  patient reports her parents are picking her up  Do you have the ability to pay for your medications: Yes,  BCBS, support from parents  Release of information consent forms completed and in the chart;  Patient's signature needed at discharge.  Patient to Follow up at: Follow-up Information    Partial Hospitalization Program Follow up on 02/27/2019.   Why:  Please attend your CCA appointment on Tuesday, 3/31 at 10:00a.  Be sure to bring your photo ID and proof of insurance.  Contact information: [address], [address] [place] [address] Phone: [phone] Fax: [phone]       Milagros Evener, MD Follow up on 02/26/2019.   Specialty:  Psychiatry Why:  Medication management appointment is Monday, 3/30 at 2:45p.  Please bring your current mediucations and discharge paperwork from this hospitalization.  Contact information: [address] [place] [address] [phone]        [facility] Follow up.   Why:  Please call and schedule a therapy appointment with agency.  Contact information: [address] [place] [address] [phone]           Next level of care provider has access to [facility] Link:yes  Safety Planning and Suicide Prevention discussed: Yes,  with the patient      Has patient been referred to the Quitline?: N/A patient is not a smoker  Patient has been referred for addiction treatment: N/A  Maeola Sarah, LCSWA 02/26/2019, 9:50 AM

## 2019-02-26 NOTE — Progress Notes (Addendum)
D: Pt was in the dayroom upon initial approach.  Pt presents with anxious affect and mood.  She describes her day as "all right."  She reports "I got discharge tomorrow" and states she feels "a little nervous."  Pt described her aftercare plan to writer, which consists of her parents coming to get her, a meeting with her psychiatrist, and a meeting with her surgeon.  Pt denies SI/HI, denies hallucinations, reports bilateral forearm pain of 7/10.  Pt has been visible in milieu interacting with peers and staff appropriately.  Pt reports her L thumb feels "kind of numb" and she reports she has already informed provider of this.    A: Introduced self to pt.  Met with pt 1:1.  Actively listened to pt and offered support and encouragement.  Medications administered per order.  PRN medication administered for anxiety and pain.  Capillary refill assessed in bilateral fingers and noted to be less than 2 seconds.  ROM performed and WNL except for pt reports limited movement of L thumb, which provider is aware of per notes. Q15 minute safety checks maintained.  R: Pt is safe on the unit.  Pt is compliant with medications.  Pt verbally contracts for safety.  Will continue to monitor and assess.

## 2019-02-26 NOTE — Progress Notes (Signed)
Recreation Therapy Notes  Date:  3.20.20 Time: 0930 Location: [address] Dayroom  Group Topic: Stress Management  Goal Area(s) Addresses:  Patient will identify positive stress management techniques. Patient will identify benefits of using stress management post d/c.  Intervention:  Stress Management  Activity :  Meditation.  LRT introduced the stress management technique of meditation.  LRT played a meditation that focused on making the most of the day and the possibilities that are available.  Patients were to listen as the meditation as it played to engage in the meditation.   Education:  Stress Management, Discharge Planning.   Education Outcome: Acknowledges Education  Clinical Observations/Feedback:  Pt did not attend group.     Mindy Gali, LRT/CTRS         Imaad Reuss A 02/26/2019 12:38 PM 

## 2019-02-27 ENCOUNTER — Other Ambulatory Visit: Payer: Self-pay

## 2019-02-27 ENCOUNTER — Other Ambulatory Visit (HOSPITAL_COMMUNITY): Payer: BLUE CROSS/BLUE SHIELD

## 2019-02-27 DIAGNOSIS — M79601 Pain in right arm: Secondary | ICD-10-CM | POA: Diagnosis not present

## 2019-02-27 DIAGNOSIS — M25631 Stiffness of right wrist, not elsewhere classified: Secondary | ICD-10-CM | POA: Diagnosis not present

## 2019-02-27 DIAGNOSIS — S66921D Laceration of unspecified muscle, fascia and tendon at wrist and hand level, right hand, subsequent encounter: Secondary | ICD-10-CM | POA: Diagnosis not present

## 2019-02-27 DIAGNOSIS — S66922D Laceration of unspecified muscle, fascia and tendon at wrist and hand level, left hand, subsequent encounter: Secondary | ICD-10-CM | POA: Diagnosis not present

## 2019-02-28 ENCOUNTER — Telehealth (HOSPITAL_COMMUNITY): Payer: Self-pay | Admitting: Professional

## 2019-03-06 NOTE — Anesthesia Postprocedure Evaluation (Signed)
Anesthesia Post Note  Patient: Shelby Gallegos  Procedure(s) Performed: WOUND EXPLORATION (Bilateral Arm Lower) TENDON REPAIR BILATERAL FOREARMS (Bilateral Arm Lower)     Patient location during evaluation: PACU Anesthesia Type: General Level of consciousness: awake and alert Pain management: pain level controlled Vital Signs Assessment: post-procedure vital signs reviewed and stable Respiratory status: spontaneous breathing, nonlabored ventilation, respiratory function stable and patient connected to nasal cannula oxygen Cardiovascular status: blood pressure returned to baseline and stable Postop Assessment: no apparent nausea or vomiting Anesthetic complications: no    Last Vitals:  Vitals:   02/20/19 1600 02/20/19 1632  BP: 102/85 122/70  Pulse: 88 87  Resp: 19 18  Temp:  36.7 C  SpO2: 100% 100%    Last Pain:  Vitals:   02/20/19 1632  TempSrc:   PainSc: 4                  Hendricks Schwandt

## 2019-03-20 DIAGNOSIS — M25532 Pain in left wrist: Secondary | ICD-10-CM | POA: Diagnosis not present

## 2019-03-20 DIAGNOSIS — S66921D Laceration of unspecified muscle, fascia and tendon at wrist and hand level, right hand, subsequent encounter: Secondary | ICD-10-CM | POA: Diagnosis not present

## 2019-03-20 DIAGNOSIS — M25631 Stiffness of right wrist, not elsewhere classified: Secondary | ICD-10-CM | POA: Diagnosis not present

## 2019-03-20 DIAGNOSIS — S66922D Laceration of unspecified muscle, fascia and tendon at wrist and hand level, left hand, subsequent encounter: Secondary | ICD-10-CM | POA: Diagnosis not present

## 2019-03-22 ENCOUNTER — Telehealth (HOSPITAL_COMMUNITY): Payer: Self-pay | Admitting: Professional

## 2019-03-26 ENCOUNTER — Telehealth (HOSPITAL_COMMUNITY): Payer: Self-pay | Admitting: Professional

## 2019-03-27 ENCOUNTER — Telehealth (HOSPITAL_COMMUNITY): Payer: Self-pay | Admitting: Professional

## 2019-03-27 DIAGNOSIS — M25532 Pain in left wrist: Secondary | ICD-10-CM | POA: Diagnosis not present

## 2019-03-27 DIAGNOSIS — S66922D Laceration of unspecified muscle, fascia and tendon at wrist and hand level, left hand, subsequent encounter: Secondary | ICD-10-CM | POA: Diagnosis not present

## 2019-03-27 DIAGNOSIS — S66921D Laceration of unspecified muscle, fascia and tendon at wrist and hand level, right hand, subsequent encounter: Secondary | ICD-10-CM | POA: Diagnosis not present

## 2019-03-27 DIAGNOSIS — M25531 Pain in right wrist: Secondary | ICD-10-CM | POA: Diagnosis not present

## 2019-04-02 ENCOUNTER — Ambulatory Visit (HOSPITAL_COMMUNITY): Payer: BLUE CROSS/BLUE SHIELD

## 2019-04-04 DIAGNOSIS — M25532 Pain in left wrist: Secondary | ICD-10-CM | POA: Diagnosis not present

## 2019-04-04 DIAGNOSIS — S66922D Laceration of unspecified muscle, fascia and tendon at wrist and hand level, left hand, subsequent encounter: Secondary | ICD-10-CM | POA: Diagnosis not present

## 2019-04-04 DIAGNOSIS — S66921D Laceration of unspecified muscle, fascia and tendon at wrist and hand level, right hand, subsequent encounter: Secondary | ICD-10-CM | POA: Diagnosis not present

## 2019-04-04 DIAGNOSIS — M25631 Stiffness of right wrist, not elsewhere classified: Secondary | ICD-10-CM | POA: Diagnosis not present

## 2019-04-05 ENCOUNTER — Other Ambulatory Visit: Payer: Self-pay

## 2019-04-05 ENCOUNTER — Other Ambulatory Visit (HOSPITAL_COMMUNITY): Payer: BLUE CROSS/BLUE SHIELD | Attending: Psychiatry | Admitting: Licensed Clinical Social Worker

## 2019-04-05 DIAGNOSIS — Z915 Personal history of self-harm: Secondary | ICD-10-CM | POA: Insufficient documentation

## 2019-04-05 DIAGNOSIS — F603 Borderline personality disorder: Secondary | ICD-10-CM | POA: Diagnosis not present

## 2019-04-05 DIAGNOSIS — Z79899 Other long term (current) drug therapy: Secondary | ICD-10-CM | POA: Diagnosis not present

## 2019-04-05 DIAGNOSIS — F314 Bipolar disorder, current episode depressed, severe, without psychotic features: Secondary | ICD-10-CM

## 2019-04-05 DIAGNOSIS — Z818 Family history of other mental and behavioral disorders: Secondary | ICD-10-CM | POA: Insufficient documentation

## 2019-04-06 ENCOUNTER — Other Ambulatory Visit: Payer: Self-pay

## 2019-04-06 ENCOUNTER — Other Ambulatory Visit (HOSPITAL_COMMUNITY): Payer: BLUE CROSS/BLUE SHIELD
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
## 2019-04-09 ENCOUNTER — Encounter (HOSPITAL_COMMUNITY): Payer: Self-pay | Admitting: Family

## 2019-04-09 ENCOUNTER — Other Ambulatory Visit: Payer: Self-pay

## 2019-04-09 ENCOUNTER — Other Ambulatory Visit (HOSPITAL_COMMUNITY): Payer: BLUE CROSS/BLUE SHIELD | Admitting: Licensed Clinical Social Worker

## 2019-04-09 DIAGNOSIS — F314 Bipolar disorder, current episode depressed, severe, without psychotic features: Secondary | ICD-10-CM | POA: Diagnosis not present

## 2019-04-09 DIAGNOSIS — F603 Borderline personality disorder: Secondary | ICD-10-CM | POA: Diagnosis not present

## 2019-04-09 DIAGNOSIS — Z79899 Other long term (current) drug therapy: Secondary | ICD-10-CM | POA: Diagnosis not present

## 2019-04-09 DIAGNOSIS — Z915 Personal history of self-harm: Secondary | ICD-10-CM | POA: Diagnosis not present

## 2019-04-09 DIAGNOSIS — Z818 Family history of other mental and behavioral disorders: Secondary | ICD-10-CM | POA: Diagnosis not present
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
## 2019-04-09 NOTE — Psych (Signed)
Virtual Visit via Video Note  I connected with Shelby Gallegos on 04/09/19 at  9:00 AM EDT by a video enabled telemedicine application and verified that I am speaking with the correct person using two identifiers.   I discussed the limitations of evaluation and management by telemedicine and the availability of in person appointments. The patient expressed understanding and agreed to proceed.    I discussed the assessment and treatment plan with the patient. The patient was provided an opportunity to ask questions and all were answered. The patient agreed with the plan and demonstrated an understanding of the instructions.   The patient was advised to call back or seek an in-person evaluation if the symptoms worsen or if the condition fails to improve as anticipated.  I provided 10 minutes of non-face-to-face time during this encounter.  Pt verbally agrees to treatment plan.  Quinn Axe, [facility]

## 2019-04-10 ENCOUNTER — Other Ambulatory Visit (HOSPITAL_COMMUNITY): Payer: BLUE CROSS/BLUE SHIELD | Admitting: Licensed Clinical Social Worker

## 2019-04-10 ENCOUNTER — Other Ambulatory Visit (HOSPITAL_COMMUNITY): Payer: BLUE CROSS/BLUE SHIELD | Admitting: Occupational Therapy

## 2019-04-10 ENCOUNTER — Encounter (HOSPITAL_COMMUNITY): Payer: Self-pay | Admitting: Occupational Therapy

## 2019-04-10 ENCOUNTER — Other Ambulatory Visit: Payer: Self-pay

## 2019-04-10 ENCOUNTER — Encounter (HOSPITAL_COMMUNITY): Payer: Self-pay

## 2019-04-10 VITALS — Ht 67.0 in | Wt 210.0 lb

## 2019-04-10 DIAGNOSIS — F603 Borderline personality disorder: Secondary | ICD-10-CM

## 2019-04-10 DIAGNOSIS — Z79899 Other long term (current) drug therapy: Secondary | ICD-10-CM | POA: Diagnosis not present

## 2019-04-10 DIAGNOSIS — R4589 Other symptoms and signs involving emotional state: Secondary | ICD-10-CM

## 2019-04-10 DIAGNOSIS — Z818 Family history of other mental and behavioral disorders: Secondary | ICD-10-CM | POA: Diagnosis not present

## 2019-04-10 DIAGNOSIS — F314 Bipolar disorder, current episode depressed, severe, without psychotic features: Secondary | ICD-10-CM

## 2019-04-10 DIAGNOSIS — Z915 Personal history of self-harm: Secondary | ICD-10-CM | POA: Diagnosis not present

## 2019-04-10 NOTE — Therapy (Signed)
[facility] PARTIAL HOSPITALIZATION PROGRAM [address] [place], [place], [address] Phone: [phone]   Fax:  [phone]  Occupational Therapy Evaluation  Patient Details  Name: Shelby Gallegos MRN: [id number] Date of Birth: 16-Sep-1981 Referring Provider (OT): Hillery Jacks, NP  Virtual Visit via Video Note  I connected with Shelby Gallegos on 04/10/19 at  8:00 AM EDT by a video enabled telemedicine application and verified that I am speaking with the correct person using two identifiers.   I discussed the limitations of evaluation and management by telemedicine and the availability of in person appointments. The patient expressed understanding and agreed to proceed.  I discussed the assessment and treatment plan with the patient. The patient was provided an opportunity to ask questions and all were answered. The patient agreed with the plan and demonstrated an understanding of the instructions.   The patient was advised to call back or seek an in-person evaluation if the symptoms worsen or if the condition fails to improve as anticipated.  I provided 60 minutes of non-face-to-face time during this encounter.  Dalphine Handing, MSOT, OTR/L Behavioral Health OT/ Acute Relief OT PHP Office: [phone]  Dalphine Handing, [place]    Encounter Date: 04/10/2019  OT End of Session - 04/10/19 1410    Visit Number  1    Number of Visits  12    Date for OT Re-Evaluation  05/08/19    Authorization Type  BCBS    OT Start Time  1030    OT Stop Time  1200    OT Time Calculation (min)  90 min    Activity Tolerance  Patient tolerated treatment well    Behavior During Therapy  WFL for tasks assessed/performed       Past Medical History:  Diagnosis Date  . ADHD (attention deficit hyperactivity disorder)   . Bipolar 1 disorder (HCC)   . Migraine   . Suicide attempt by drug overdose (HCC) 02/15/2019    Past Surgical History:  Procedure Laterality Date  . NO PAST  SURGERIES    . TENDON REPAIR Bilateral 02/20/2019   Procedure: TENDON REPAIR BILATERAL FOREARMS;  Surgeon: [name], MD;  Location: [facility];  Service: Orthopedics;  Laterality: Bilateral;  . WOUND EXPLORATION Bilateral 02/20/2019   Procedure: WOUND EXPLORATION;  Surgeon: [name], MD;  Location: [facility];  Service: Orthopedics;  Laterality: Bilateral;    There were no vitals filed for this visit.  Subjective Assessment - 04/10/19 1359    Currently in Pain?  Other (Comment)   pain in arms from wound       [facility] OT Assessment - 04/10/19 0001      Assessment   Medical Diagnosis  Bipolar affective disorder current episode depressed; Borderline personality disorder    Referring Provider (OT)  Hillery Jacks, NP    Onset Date/Surgical Date  04/10/19      Precautions   Precautions  None    Precaution Comments  Pt with self inflicted wound to B arms      Restrictions   Weight Bearing Restrictions  No      Balance Screen   Has the patient fallen in the past 6 months  No    Has the patient had a decrease in activity level because of a fear of falling?   No    Is the patient reluctant to leave their home because of a fear of falling?   No  OT assessment: OCAIRS  Diagnosis: Bipolar affective disorder, current episode depressed; Borderline personality disorder  Past medical history/referral information: Pt presents to [facility] after a recent [facility] stay a month ago for a suicide attempt including overdose and self inflicted wounds on her B UEs.  Subjective: Pt presents to evaluation casually dressed, with blunted affect. Overall pt very polite and respectful, appearing motivated for change through PHP by stating many times "she is willing to try whatever you give me". Pt is often seen fidgeting with rings on her finger, a coping skill she states helps her when she is nervous/anxious.   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  Leisure: Pt shares that she enjoys photography and coloring. She has had a decrease in motivation with these two things  Social support: Pt shares she has been making pro social contact with her best friend and her family. She is also involved in a church group that meets x1 per week.  Struggles: coping skill implication, sleep hygiene, goal setting, routine.  OCAIRS Mental Health Interview Summary of Client Scores:  FACILITATES PARTICIPATION IN OCCUPATION  ALLOWS PARTICIPATION IN OCCUPATION INHIBITS PARTICIPATION IN OCCUPATION RESTRICTS PARTICIPATION IN OCCUPATION COMMENTS  ROLES               X  Recent role changes and role management issues  HABITS               X  Current difficulty managing routine  PERSONAL CAUSATION               X  Difficulty identifying clearly, mostly negative  VALUES              X     INTERESTS                X Able to name areas, but not currently engaging due to lack of motivation  SKILLS               X  Difficulty with concentration and attention  SHORT TERM GOALS               X  Starting to set smaller goals, difficulty with accountability  LONG TERM GOALS                X None mentioned  INTERPETATION OF PAST EXPERIENCES               X  Mostly negative  PHYSICAL ENVIRONMENT               X  Difficulty managing housework, hygiene, etc.  SOCIAL ENVIRONMENT               X  Making social contacts more recent but not yet at baseline  READINESS FOR CHANGE               X  Diff adjusting    Need for Occupational Therapy:  4 Shows positive occupational  participation, no need for OT.   3 Need for minimal intervention/consultative participation    X 2 Need for OT intervention  indicated to restore/improve participation   1 Need for extensive OT intervention indicated to improve participation.  Referral for follow up services also recommended.    Assessment:  Patient demonstrates behavior that inhibits participation in occupation.  Patient will benefit from occupational therapy intervention in order to improve time management, financial management, stress management, job readiness skills, social skills, and health management skills in preparation to return to full time community living and to be a productive community member.    Plan:  Patient will participate in skilled occupational therapy sessions individually or in a group setting to improve coping skills, psychosocial skills, and emotional skills required to return to prior level of function.  Treatment will be 3 times per week for 4 weeks.     OT TREATMENT  S: "I am mostly working on smaller goals right now to build myself back up"  O: Education given on self-accountability being in line with personal values and goals to maintain occupational balance in various community settings. Pt given goal identifying worksheet to list immediate, short term, medium term, and long-term goals using a SMART goal framework (specificity, meaningful, adaptive, realistic, and time bound). Goals created as guideline for pt to practice being accountable in various situations. Pt completed work sheet of goals and encouraged to share goals with the group, with emphasis on immediate goal for check in with pt for next session to maintain accountability.   A: Pt presents to group with blunted affect, engaged and participatory throughout entirety of session this date. Pt completed goals work sheet using SMART goal framework, while remaining in line with values for promotion of occupational balance to help foster  continuous practice of accountability skills. Pt formulated goals around personal growth and health and leisure. Her goals were centered around home maintaining, personal hygiene, and sleep hygiene as well as routine building goals. Will continue to follow up with pt on goal implementation.  P: OT group will be x3 per week while pt in PHP.              OT Education - 04/10/19 1409    Education Details  education given on goals/values    Person(s) Educated  Patient    Methods  Explanation;Handout    Comprehension  Verbalized understanding       OT Short Term Goals - 04/10/19 1413      OT SHORT TERM GOAL #1   Title  Pt will be educated on strategies to improve psychosocial skills needed to participate fully in all daily, work, and leisure activities    Time  4    Period  Weeks    Status  New    Target Date  05/08/19      OT SHORT TERM GOAL #2   Title  Pt will apply psychosocial skills and coping mechanisms to daily activities in order to function independently and reintegrate into community    Time  4    Period  Weeks    Status  New    Target Date  05/08/19      OT SHORT TERM GOAL #3   Title  Pt will recall and/or apply 1-3 sleep hygiene strategies to improve BADL participation prior to reintegrating into community    Time  4    Period  Weeks    Status  New    Target Date  05/08/19      OT SHORT TERM GOAL #4   Title  Pt will engage in goal setting to improve BADL/IADL  participation prior to reintegrating into community    Time  4    Period  Weeks    Status  New    Target Date  05/08/19      OT SHORT TERM GOAL #5   Title  Pt will create and/or implement functional BADL/IADL routine prior to reintegrating into community    Time  4    Period  Weeks    Status  New    Target Date  05/08/19               Plan - 04/10/19 1411    OT Occupational Profile and History  Detailed Assessment- Review of Records and additional review of physical, cognitive,  psychosocial history related to current functional performance    Occupational performance deficits (Please refer to evaluation for details):  ADL's;IADL's;Rest and Sleep;Work;Leisure;Social Participation    Body Structure / Function / Physical Skills  ADL    Psychosocial Skills  Coping Strategies;Habits;Routines and Behaviors;Environmental  Adaptations;Interpersonal Interaction    Rehab Potential  Good    Clinical Decision Making  Several treatment options, min-mod task modification necessary    Comorbidities Affecting Occupational Performance:  May have comorbidities impacting occupational performance    Modification or Assistance to Complete Evaluation   Min-Moderate modification of tasks or assist with assess necessary to complete eval    OT Frequency  3x / week    OT Duration  4 weeks    OT Treatment/Interventions  Self-care/ADL training;Coping strategies training;Psychosocial skills training;Other (comment)   community reintegration   Consulted and Agree with Plan of Care  Patient       Patient will benefit from skilled therapeutic intervention in order to improve the following deficits and impairments:  Body Structure / Function / Physical Skills, Psychosocial Skills  Visit Diagnosis: Bipolar affective disorder, depressed, severe (HCC)  Borderline personality disorder (HCC)  Difficulty coping    Problem List Patient Active Problem List   Diagnosis Date Noted  . Toxic encephalopathy 02/16/2019  . Suicide attempt (HCC)   . Overdose 02/15/2019  . Suicidal behavior with attempted self-injury (HCC) 02/15/2019  . AKI (acute kidney injury) (HCC) 02/15/2019  . Leukocytosis 02/15/2019  . Laceration of multiple sites of skin 02/15/2019  . Intentional acetaminophen overdose (HCC)   . History of suicide attempt 04/01/2017  . Borderline personality disorder (HCC) 02/16/2017  . Bipolar affective disorder, depressed, severe (HCC) 08/21/2015  . ADHD (attention deficit hyperactivity  disorder) 08/21/2015  . Headache, migraine 09/28/2014  . Abnormal ECG 11/26/2009  . Essential (primary) hypertension 11/26/2009   Dalphine Handing, MSOT, OTR/L Behavioral Health OT/ Acute Relief OT PHP Office: [phone]  Dalphine Handing 04/10/2019, 2:24 PM  [facility] PARTIAL HOSPITALIZATION PROGRAM [address] [place], [place], [address] Phone: [phone]   Fax:  [phone]  Name: Shelby Gallegos MRN: [id number] Date of Birth: [birth year]

## 2019-04-10 NOTE — Progress Notes (Signed)
Met with patient through virtual WebEx session as she presented with restricted affect, depressed mood and denied any current suicidal or homicidal ideations, no auditory or visual hallucinations, and no plans to want to harm self or others at this time.  Patient reported she has been hospitalized 3 times in her life; once in 1998 with suicidal ideations and plan but no attempt, once in 2018 with suicidal ideations and plan but no attempts and now with her admission following suicidal attempt from 04/17/19.  Patient stated on the day she attempted to commit suicide, she just woke up and actually saw the words in there thoughts, "I'm going to die today".  Patient reported her primary psychiatrist thinks she had a dissociative event as nothing really lead up to this and she does not remember the actual specifics of trying to harm self.  States she remembers going to pick up Xanax medications and buying a utility knife but nothing really after that until after her stay in the hospital and was after 3 days being transferred to the [facility].  Reports she then was transported back after several days there for tendon surgeries on both arms due to deep cuts but these are healing and doing better now.  Patient reports no other thoughts or plans to want to harm self or others since discharge from [facility] but does at times still have some ruminating thoughts as to why she was not successful and why she decided to try to harm herself at all.  Patient reviewed all medications she is currently taking and states only side effect she has noticed is one time blurred vision some that lasted a few hours.  States this has not been each day or ongoing so she will continue to monitor.  Patient reports she is currently staying with her parents who she reports are great supports and also sees a close friend at least once a week that she states is another positive support.  Patient rated her current level of depression a 6-7, anxiety a 6, and  hopefulness a 7 on a scale of 0-10 with 0 being none and no hope and 10 being the worst she could manage but hopeful.  Patient scored a 17 on her administered PHQ9 de[ression screening today and reports so far liking PHP group.  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  Patient reported now sleeping 5-6 hours a night with good appetite and agreed to keep this nurse or PHP staff informed of any worsening of symptoms or thoughts of wanting to harm self or others.  Patient reported no safety issues at this time and no current problems with medications.  Reports mood more stable and with discussion with Ricky Ala, NP stated plan to say on Gabapentin at 400 mg twice a day as she states she never increased to 4 times a day as was prescribed.  States this medication does make her sleepy at times so thinks more than twice a day will be sedating and NP agreed with continuation of twice a day only.  Patient returned to group today and encouraged her to be as open  with group as she could and to practice skills she would learn.

## 2019-04-11 ENCOUNTER — Other Ambulatory Visit (HOSPITAL_COMMUNITY): Payer: BLUE CROSS/BLUE SHIELD | Admitting: Licensed Clinical Social Worker

## 2019-04-11 DIAGNOSIS — F603 Borderline personality disorder: Secondary | ICD-10-CM

## 2019-04-11 DIAGNOSIS — M25532 Pain in left wrist: Secondary | ICD-10-CM | POA: Diagnosis not present

## 2019-04-11 DIAGNOSIS — Z818 Family history of other mental and behavioral disorders: Secondary | ICD-10-CM | POA: Diagnosis not present

## 2019-04-11 DIAGNOSIS — M25531 Pain in right wrist: Secondary | ICD-10-CM | POA: Diagnosis not present

## 2019-04-11 DIAGNOSIS — M25631 Stiffness of right wrist, not elsewhere classified: Secondary | ICD-10-CM | POA: Diagnosis not present

## 2019-04-11 DIAGNOSIS — Z915 Personal history of self-harm: Secondary | ICD-10-CM | POA: Diagnosis not present

## 2019-04-11 DIAGNOSIS — F314 Bipolar disorder, current episode depressed, severe, without psychotic features: Secondary | ICD-10-CM

## 2019-04-11 DIAGNOSIS — S66922D Laceration of unspecified muscle, fascia and tendon at wrist and hand level, left hand, subsequent encounter: Secondary | ICD-10-CM | POA: Diagnosis not present

## 2019-04-11 DIAGNOSIS — Z79899 Other long term (current) drug therapy: Secondary | ICD-10-CM | POA: Diagnosis not present

## 2019-04-11 DIAGNOSIS — S66921D Laceration of unspecified muscle, fascia and tendon at wrist and hand level, right hand, subsequent encounter: Secondary | ICD-10-CM | POA: Diagnosis not present

## 2019-04-11 NOTE — Progress Notes (Signed)
Pt attended spiritual care group 04/11/2019 11:00-12:00.  Group met via web-ex due to COVID-19 precautions.  Group facilitated by Simone Curia, MDiv, [place]   Group focused on topic of "community."  Members reflected on topic in facilitated dialog, identifying responses to topic and notions they hold of community from their previous experience.  Group members utilized value sort cards to identify top qualities they look for in community.  Engaged in facilitated dialog around their value choices, noting origin of these values, how these are realized in their lives, and strategies for engaging these values.    Spiritual care group drew on Motivational Interviewing, Narrative and Adlerian modalities.  Shelby Gallegos was present throughout group.  Engaged with other group members and facilitator on prompting.  Noted community as people who support, build up, and promote better for [place].  Related to other members around idea of growth as a value in community.  Stated she feels most comfortable in community when she feels she can be genuine and experiences genuineness from others.

## 2019-04-12 ENCOUNTER — Other Ambulatory Visit (HOSPITAL_COMMUNITY): Payer: BLUE CROSS/BLUE SHIELD | Admitting: Licensed Clinical Social Worker

## 2019-04-12 ENCOUNTER — Other Ambulatory Visit: Payer: Self-pay

## 2019-04-12 ENCOUNTER — Encounter (HOSPITAL_COMMUNITY): Payer: Self-pay | Admitting: Occupational Therapy

## 2019-04-12 ENCOUNTER — Other Ambulatory Visit (HOSPITAL_COMMUNITY): Payer: BLUE CROSS/BLUE SHIELD | Admitting: Occupational Therapy

## 2019-04-12 DIAGNOSIS — F314 Bipolar disorder, current episode depressed, severe, without psychotic features: Secondary | ICD-10-CM

## 2019-04-12 DIAGNOSIS — F603 Borderline personality disorder: Secondary | ICD-10-CM

## 2019-04-12 DIAGNOSIS — Z915 Personal history of self-harm: Secondary | ICD-10-CM | POA: Diagnosis not present

## 2019-04-12 DIAGNOSIS — Z818 Family history of other mental and behavioral disorders: Secondary | ICD-10-CM | POA: Diagnosis not present

## 2019-04-12 DIAGNOSIS — Z79899 Other long term (current) drug therapy: Secondary | ICD-10-CM | POA: Diagnosis not present

## 2019-04-12 DIAGNOSIS — R4589 Other symptoms and signs involving emotional state: Secondary | ICD-10-CM

## 2019-04-12 NOTE — Therapy (Signed)
[facility] PARTIAL HOSPITALIZATION PROGRAM [address] [place], [place], [address] Phone: [phone]   Fax:  [phone]  Occupational Therapy Treatment  Patient Details  Name: Shelby Gallegos MRN: [id number] Date of Birth: [birth year] Referring Provider (OT): Hillery Jacks, NP  Virtual Visit via Video Note  I connected with Shelby Gallegos on 04/12/19 at  8:00 AM EDT by a video enabled telemedicine application and verified that I am speaking with the correct person using two identifiers.   I discussed the limitations of evaluation and management by telemedicine and the availability of in person appointments. The patient expressed understanding and agreed to proceed.   I discussed the assessment and treatment plan with the patient. The patient was provided an opportunity to ask questions and all were answered. The patient agreed with the plan and demonstrated an understanding of the instructions.   The patient was advised to call back or seek an in-person evaluation if the symptoms worsen or if the condition fails to improve as anticipated.  I provided 60 minutes of non-face-to-face time during this encounter.  Dalphine Handing, MSOT, OTR/L Behavioral Health OT/ Acute Relief OT PHP Office: [phone]  Dalphine Handing, [place]    Encounter Date: 04/12/2019  OT End of Session - 04/12/19 1452    Visit Number  2    Number of Visits  12    Date for OT Re-Evaluation  05/08/19    Authorization Type  BCBS    OT Start Time  1100    OT Stop Time  1200    OT Time Calculation (min)  60 min    Activity Tolerance  Patient tolerated treatment well    Behavior During Therapy  [facility] for tasks assessed/performed       Past Medical History:  Diagnosis Date  . ADHD (attention deficit hyperactivity disorder)   . Bipolar 1 disorder (HCC)   . Migraine   . Suicide attempt by drug overdose (HCC) 02/15/2019    Past Surgical History:  Procedure Laterality Date  . NO PAST  SURGERIES    . TENDON REPAIR Bilateral 02/20/2019   Procedure: TENDON REPAIR BILATERAL FOREARMS;  Surgeon: [name], MD;  Location: [facility];  Service: Orthopedics;  Laterality: Bilateral;  . WOUND EXPLORATION Bilateral 02/20/2019   Procedure: WOUND EXPLORATION;  Surgeon: [name], MD;  Location: [facility];  Service: Orthopedics;  Laterality: Bilateral;    There were no vitals filed for this visit.  Subjective Assessment - 04/12/19 1450    Currently in Pain?  --   pain in arms from wound     S: "I struggle with sticking to a routine and continuing it for the long term"   O: Education given on routine management and its importance in increasing functional BADL/IADL independence. Education given on how to build daily routines, with various tips of organization and time management included. Home maintaining, meal preparation, child/pet care, work life balance, and medication management all discussed. Pt asked to share personal experiences and one new skill they would like to implement from session. Not all education completed this date and to be finished next date.  A: Pt presents tog roup with blunted affect, engaged and participatory throughout session. Pt showing implementation of skills learned from previous session, also appearing very motivated by note taking and asking questions. Pt shares her biggest difficulty with routine is maintaining accountability and sticking to it consistently. She also shares how her routine has changed from the stay at  home orders with the COVID 19 pandemic and how this has been a struggle to adapt. Continued education and skill building will continue next date.  P: OT group will be x4 per week while pt in PHP                      OT Education - 04/12/19 1450    Education Details  education given on routine creation and implementation    Person(s) Educated  Patient    Methods  Explanation;Handout     Comprehension  Verbalized understanding       OT Short Term Goals - 04/12/19 1453      OT SHORT TERM GOAL #1   Title  Pt will be educated on strategies to improve psychosocial skills needed to participate fully in all daily, work, and leisure activities    Time  4    Period  Weeks    Status  On-going    Target Date  05/08/19      OT SHORT TERM GOAL #2   Title  Pt will apply psychosocial skills and coping mechanisms to daily activities in order to function independently and reintegrate into community    Time  4    Period  Weeks    Status  On-going    Target Date  05/08/19      OT SHORT TERM GOAL #3   Title  Pt will recall and/or apply 1-3 sleep hygiene strategies to improve BADL participation prior to reintegrating into community    Time  4    Period  Weeks    Status  On-going    Target Date  05/08/19      OT SHORT TERM GOAL #4   Title  Pt will engage in goal setting to improve BADL/IADL participation prior to reintegrating into community    Time  4    Period  Weeks    Status  On-going    Target Date  05/08/19      OT SHORT TERM GOAL #5   Title  Pt will create and/or implement functional BADL/IADL routine prior to reintegrating into community    Time  4    Period  Weeks    Status  On-going    Target Date  05/08/19               Plan - 04/12/19 1452    Occupational performance deficits (Please refer to evaluation for details):  ADL's;IADL's;Rest and Sleep;Work;Leisure;Social Participation    Body Structure / Function / Physical Skills  ADL    Psychosocial Skills  Coping Strategies;Habits;Routines and Behaviors;Environmental  Adaptations;Interpersonal Interaction       Patient will benefit from skilled therapeutic intervention in order to improve the following deficits and impairments:  Body Structure / Function / Physical Skills, Psychosocial Skills  Visit Diagnosis: Bipolar affective disorder, depressed, severe (HCC)  Borderline personality disorder  (HCC)  Difficulty coping    Problem List Patient Active Problem List   Diagnosis Date Noted  . Toxic encephalopathy 02/16/2019  . Suicide attempt (HCC)   . Overdose 02/15/2019  . Suicidal behavior with attempted self-injury (HCC) 02/15/2019  . AKI (acute kidney injury) (HCC) 02/15/2019  . Leukocytosis 02/15/2019  . Laceration of multiple sites of skin 02/15/2019  . Intentional acetaminophen overdose (HCC)   . History of suicide attempt 04/01/2017  . Borderline personality disorder (HCC) 02/16/2017  . Bipolar affective disorder, depressed, severe (HCC) 08/21/2015  . ADHD (attention deficit hyperactivity disorder) 08/21/2015  .  Headache, migraine 09/28/2014  . Abnormal ECG 11/26/2009  . Essential (primary) hypertension 11/26/2009   Dalphine HandingKaylee Sevastian Witczak, MSOT, OTR/L Behavioral Health OT/ Acute Relief OT PHP Office: [phone]  Dalphine HandingKaylee Azariah Latendresse 04/12/2019, 2:55 PM  [facility] PARTIAL HOSPITAL[facility] PROGRAM [address] [place], [place], [address] Phone: [phone]   Fax:  [phone]  Name: Shelby Gallegos MRN: [id number] Date of Birth: 10-05-1981

## 2019-04-13 ENCOUNTER — Other Ambulatory Visit: Payer: Self-pay

## 2019-04-13 ENCOUNTER — Other Ambulatory Visit (HOSPITAL_COMMUNITY): Payer: BLUE CROSS/BLUE SHIELD | Admitting: Occupational Therapy

## 2019-04-13 ENCOUNTER — Encounter (HOSPITAL_COMMUNITY): Payer: Self-pay | Admitting: Occupational Therapy

## 2019-04-13 ENCOUNTER — Other Ambulatory Visit (HOSPITAL_COMMUNITY): Payer: BLUE CROSS/BLUE SHIELD | Admitting: Licensed Clinical Social Worker

## 2019-04-13 DIAGNOSIS — F603 Borderline personality disorder: Secondary | ICD-10-CM

## 2019-04-13 DIAGNOSIS — Z818 Family history of other mental and behavioral disorders: Secondary | ICD-10-CM | POA: Diagnosis not present

## 2019-04-13 DIAGNOSIS — Z79899 Other long term (current) drug therapy: Secondary | ICD-10-CM | POA: Diagnosis not present

## 2019-04-13 DIAGNOSIS — R4589 Other symptoms and signs involving emotional state: Secondary | ICD-10-CM

## 2019-04-13 DIAGNOSIS — F314 Bipolar disorder, current episode depressed, severe, without psychotic features: Secondary | ICD-10-CM | POA: Diagnosis not present

## 2019-04-13 DIAGNOSIS — Z915 Personal history of self-harm: Secondary | ICD-10-CM | POA: Diagnosis not present

## 2019-04-13 NOTE — Therapy (Signed)
[facility] [address] [place], [place], [address] Phone: [phone]   Fax:  [phone]  Occupational Therapy Treatment  Patient Details  Name: Shelby Gallegos MRN: [id number] Date of Birth: 01-29-1981 Referring Provider (OT): Hillery Jacksanika Lewis, NP  Virtual Visit via Video Note  I connected with Shelby Gallegos on 04/13/19 at  8:00 AM EDT by a video enabled telemedicine application and verified that I am speaking with the correct person using two identifiers.   I discussed the limitations of evaluation and management by telemedicine and the availability of in person appointments. The patient expressed understanding and agreed to proceed.  I discussed the assessment and treatment plan with the patient. The patient was provided an opportunity to ask questions and all were answered. The patient agreed with the plan and demonstrated an understanding of the instructions.   The patient was advised to call back or seek an in-person evaluation if the symptoms worsen or if the condition fails to improve as anticipated.  I provided 60 minutes of non-face-to-face time during this encounter.  Dalphine HandingKaylee Mazey Mantell, MSOT, OTR/L Behavioral Health OT/ Acute Relief OT PHP Office: [phone]  Dalphine HandingKaylee Zyier Dykema, [place]    Encounter Date: 04/13/2019  OT End of Session - 04/13/19 1340    Visit Number  3    Number of Visits  12    Date for OT Re-Evaluation  05/08/19    Authorization Type  [employer]    OT Start Time  1100    OT Stop Time  1200    OT Time Calculation (min)  60 min    Activity Tolerance  Patient tolerated treatment well    Behavior During Therapy  WFL for tasks assessed/performed       Past Medical History:  Diagnosis Date  . ADHD (attention deficit hyperactivity disorder)   . Bipolar 1 disorder (HCC)   . Migraine   . Suicide attempt by drug overdose (HCC) 02/15/2019    Past Surgical History:  Procedure Laterality Date  . NO PAST  SURGERIES    . TENDON REPAIR Bilateral 02/20/2019   Procedure: TENDON REPAIR BILATERAL FOREARMS;  Surgeon: Betha LoaKuzma, Kevin, MD;  Location: [facility];  Service: Orthopedics;  Laterality: Bilateral;  . WOUND EXPLORATION Bilateral 02/20/2019   Procedure: WOUND EXPLORATION;  Surgeon: Betha LoaKuzma, Kevin, MD;  Location: [facility];  Service: Orthopedics;  Laterality: Bilateral;    There were no vitals filed for this visit.  Subjective Assessment - 04/13/19 1339    Currently in Pain?  No/denies        S: "Doing a 'brain dump' to do list is a great idea, I tried it last night and really liked it"    O: Continued education given from last session on routine management and its importance in increasing functional BADL/IADL independence. Education given on how to build daily routines, with various tips of organization and time management included. Home maintaining, meal preparation, child/pet care, work life balance, and medication management all discussed. Pt asked to share personal experiences and one new skill they would like to implement form session.  A: Pt presents with blunted affect ,engaged and participatory throughout session. Pt showing carryover from previous session, taking notes and very engaged. Pt shares that she implemented the "brain dump" to do list and really enjoyed it. She shares she will continue this to manage her daily tasks. She also shares how washing dishes is difficult for her motivation wise, but mentioned skills to implement  to finish the skill of home maintaining. Pt expressing thanks, continuing to appear very motivated and shows evidence of skill implementation while in PHP.  P: OT group will be x3 per week while pt in PHP                   OT Education - 04/13/19 1339    Education Details  cont education given on routine creation and Comptroller) Educated  Patient    Methods  Explanation;Handout    Comprehension   Verbalized understanding       OT Short Term Goals - 04/12/19 1453      OT SHORT TERM GOAL #1   Title  Pt will be educated on strategies to improve psychosocial skills needed to participate fully in all daily, work, and leisure activities    Time  4    Period  Weeks    Status  On-going    Target Date  05/08/19      OT SHORT TERM GOAL #2   Title  Pt will apply psychosocial skills and coping mechanisms to daily activities in order to function independently and reintegrate into community    Time  4    Period  Weeks    Status  On-going    Target Date  05/08/19      OT SHORT TERM GOAL #3   Title  Pt will recall and/or apply 1-3 sleep hygiene strategies to improve BADL participation prior to reintegrating into community    Time  4    Period  Weeks    Status  On-going    Target Date  05/08/19      OT SHORT TERM GOAL #4   Title  Pt will engage in goal setting to improve BADL/IADL participation prior to reintegrating into community    Time  4    Period  Weeks    Status  On-going    Target Date  05/08/19      OT SHORT TERM GOAL #5   Title  Pt will create and/or implement functional BADL/IADL routine prior to reintegrating into community    Time  4    Period  Weeks    Status  On-going    Target Date  05/08/19               Plan - 04/13/19 1341    Occupational performance deficits (Please refer to evaluation for details):  ADL's;IADL's;Rest and Sleep;Work;Leisure;Social Participation    Body Structure / Function / Physical Skills  ADL    Psychosocial Skills  Coping Strategies;Habits;Routines and Behaviors;Environmental  Adaptations;Interpersonal Interaction       Patient will benefit from skilled therapeutic intervention in order to improve the following deficits and impairments:  Body Structure / Function / Physical Skills, Psychosocial Skills  Visit Diagnosis: Bipolar affective disorder, depressed, severe (HCC)  Borderline personality disorder (HCC)  Difficulty  coping    Problem List Patient Active Problem List   Diagnosis Date Noted  . Toxic encephalopathy 02/16/2019  . Suicide attempt (HCC)   . Overdose 02/15/2019  . Suicidal behavior with attempted self-injury (HCC) 02/15/2019  . AKI (acute kidney injury) (HCC) 02/15/2019  . Leukocytosis 02/15/2019  . Laceration of multiple sites of skin 02/15/2019  . Intentional acetaminophen overdose (HCC)   . History of suicide attempt 04/01/2017  . Borderline personality disorder (HCC) 02/16/2017  . Bipolar affective disorder, depressed, severe (HCC) 08/21/2015  . ADHD (attention deficit hyperactivity disorder) 08/21/2015  . Headache,  migraine 09/28/2014  . Abnormal ECG 11/26/2009  . Essential (primary) hypertension 11/26/2009   Dalphine Handing, MSOT, OTR/L Behavioral Health OT/ Acute Relief OT PHP Office: [phone]   Dalphine Handing 04/13/2019, 1:41 PM  [facility] H[facility]PITAL[facility] PROGRAM [address] [place], [place], [address] Phone: [phone]   Fax:  [phone]  Name: Shelby Gallegos MRN: [id number] Date of Birth: 1981-11-23

## 2019-04-16 ENCOUNTER — Other Ambulatory Visit (HOSPITAL_COMMUNITY): Payer: BLUE CROSS/BLUE SHIELD | Admitting: Licensed Clinical Social Worker

## 2019-04-16 ENCOUNTER — Other Ambulatory Visit: Payer: Self-pay

## 2019-04-16 ENCOUNTER — Encounter (HOSPITAL_COMMUNITY): Payer: Self-pay

## 2019-04-16 DIAGNOSIS — F603 Borderline personality disorder: Secondary | ICD-10-CM | POA: Diagnosis not present

## 2019-04-16 DIAGNOSIS — F314 Bipolar disorder, current episode depressed, severe, without psychotic features: Secondary | ICD-10-CM | POA: Diagnosis not present

## 2019-04-16 DIAGNOSIS — Z818 Family history of other mental and behavioral disorders: Secondary | ICD-10-CM | POA: Diagnosis not present

## 2019-04-16 DIAGNOSIS — Z915 Personal history of self-harm: Secondary | ICD-10-CM | POA: Diagnosis not present

## 2019-04-16 DIAGNOSIS — Z79899 Other long term (current) drug therapy: Secondary | ICD-10-CM | POA: Diagnosis not present

## 2019-04-16 NOTE — Progress Notes (Signed)
Met with patient through virtual WebEx session as she presented with flat affect depressed mood but denied any suicidal or homicidal ideations, no plan or intent to want to harm self or others and no auditory or visual hallucinations.  Patient stated she still having ruminating suicidal thoughts at times but denies any of these occur with any intent or plan to harm self.  Discussed this as a concern due to patients past admitted impulsivity with recent suicide attempt but patient reported she even though she has thoughts at times she has not been having any with any plan or intention to want to harm self or others.  Patient stated PHP groups "have been very helpful" and states she is practicing learned skills and using them to help when thoughts begin to ruminate.  Patient reported overall sleep has improved and that she has gotten into a better routine, going to bed between 9-10 pm and getting up each morning around 6-7am.  States usually only gets up to go to the bathroom during the middle of the night and can usually go back to sleep okay after getting up to go to the rest room.  Patient reviewed all medications with this nurse to verify orders and states she changed her Lurasidone to night time as it was making her a little tired taking it in the mornings.  Patient reported she has been getting a few more migraines and plans to follow up with her neurologist, Dr. Joretta Bachelor in [place] to see if she needs to adjust her medications to help with this.  Patient reported no other concerns at this time and no other problems with medications.  Patient rated her depression still a 7-8, anxiety a 3-4 and hopefulness a 7 on a scale of 0-10 with 0 being none and no hope to 10 with the worst she could manage but hopeful.  Patient agreed if she began to have any suicidal ideations with any plans or intent she would let this nurse or PHP staff know to help keep her safe.  Patient also agreed to inform this nurse or PHP staff  if any side effects to medications became a problem or if any negative changes to her mood.  Patient reported no other concerns at this time and stated feeling she could keep herself safe with discussed safety planning.

## 2019-04-17 ENCOUNTER — Other Ambulatory Visit (HOSPITAL_COMMUNITY): Payer: BLUE CROSS/BLUE SHIELD | Admitting: Licensed Clinical Social Worker

## 2019-04-17 ENCOUNTER — Other Ambulatory Visit (HOSPITAL_COMMUNITY): Payer: BLUE CROSS/BLUE SHIELD | Admitting: Occupational Therapy

## 2019-04-17 ENCOUNTER — Other Ambulatory Visit: Payer: Self-pay

## 2019-04-17 ENCOUNTER — Encounter (HOSPITAL_COMMUNITY): Payer: Self-pay

## 2019-04-17 DIAGNOSIS — Z915 Personal history of self-harm: Secondary | ICD-10-CM | POA: Diagnosis not present

## 2019-04-17 DIAGNOSIS — R4589 Other symptoms and signs involving emotional state: Secondary | ICD-10-CM

## 2019-04-17 DIAGNOSIS — Z818 Family history of other mental and behavioral disorders: Secondary | ICD-10-CM | POA: Diagnosis not present

## 2019-04-17 DIAGNOSIS — F314 Bipolar disorder, current episode depressed, severe, without psychotic features: Secondary | ICD-10-CM

## 2019-04-17 DIAGNOSIS — F603 Borderline personality disorder: Secondary | ICD-10-CM

## 2019-04-17 DIAGNOSIS — Z79899 Other long term (current) drug therapy: Secondary | ICD-10-CM | POA: Diagnosis not present

## 2019-04-17 NOTE — Progress Notes (Signed)
Virtual Visit via Telephone Note  I connected with Shelby Gallegos on 04/18/19 at  9:00 AM EDT by telephone and verified that I am speaking with the correct person using two identifiers.   I discussed the limitations, risks, security and privacy concerns of performing an evaluation and management service by telephone and the availability of in person appointments. I also discussed with the patient that there may be a patient responsible charge related to this service. The patient expressed understanding and agreed to proceed.   I discussed the assessment and treatment plan with the patient. The patient was provided an opportunity to ask questions and all were answered. The patient agreed with the plan and demonstrated an understanding of the instructions.   The patient was advised to call back or seek an in-person evaluation if the symptoms worsen or if the condition fails to improve as anticipated.  I provided 00 minutes of non-face-to-face time during this encounter.   Oneta Rack, NP   BH MD/PA/NP OP Progress Note  04/17/2019 9:23 AM Shelby Gallegos  MRN:  [id number]   Evaluation: Shelby Gallegos was evaluated via WebEx. she presented flat guarded but pleasant.  Patient reported a difficult time on yesterday as she reports isolation after group sessions.  States she just wanted to sleep.  Reports chronic suicidal ideations with mood irritability.  Patient reports she has been off her Latuda for the past 3 to 4 days.  Stated that she is unable to afford this medication however her psychiatrist gave her samples.  Discussed initiating additional affordable medications,    to include Abilify, however patient declined states she will follow-up with her psychiatrist Rupinderkur.  Rates her depression 8 out of 10 with 10 being the worst during this assessment.  States she is unsure what triggers her mood or feelings.  Patient reports a great support system as she states she is residing with her parents at this  time.  However does state my family does not truly understand mental illness. States her best friend who is also a peer support member checks in with her daily.  Reports a fair appetite.  States resting "okay" throughout the night.  Patient to consider initiating Abilify.  We will follow-up with patient.  Support, encouragement and  reassurance was provided.    Visit Diagnosis:    ICD-10-CM   1. Bipolar affective disorder, depressed, severe (HCC) F31.4   2. Borderline personality disorder (HCC) F60.3     Past Psychiatric History:   Past Medical History:  Past Medical History:  Diagnosis Date  . ADHD (attention deficit hyperactivity disorder)   . Bipolar 1 disorder (HCC)   . Migraine   . Suicide attempt by drug overdose (HCC) 02/15/2019    Past Surgical History:  Procedure Laterality Date  . NO PAST SURGERIES    . TENDON REPAIR Bilateral 02/20/2019   Procedure: TENDON REPAIR BILATERAL FOREARMS;  Surgeon: [name], MD;  Location: [facility];  Service: Orthopedics;  Laterality: Bilateral;  . WOUND EXPLORATION Bilateral 02/20/2019   Procedure: WOUND EXPLORATION;  Surgeon: [name], MD;  Location: [facility];  Service: Orthopedics;  Laterality: Bilateral;    Family Psychiatric History:   Family History:  Family History  Problem Relation Age of Onset  . Atrial fibrillation Mother   . Bipolar disorder Paternal Uncle   . Alcohol abuse Paternal Uncle   . Depression Paternal Uncle   . OCD Paternal Uncle     Social History:  Social History  Socioeconomic History  . Marital status: Single    Spouse name: Not on file  . Number of children: Not on file  . Years of education: Not on file  . Highest education level: Not on file  Occupational History  . Not on file  Social Needs  . Financial resource strain: Not very hard  . Food insecurity:    Worry: Never true    Inability: Never true  . Transportation needs:    Medical: No     Non-medical: No  Tobacco Use  . Smoking status: Never Smoker  . Smokeless tobacco: Never Used  Substance and Sexual Activity  . Alcohol use: No  . Drug use: No  . Sexual activity: Not Currently  Lifestyle  . Physical activity:    Days per week: 2 days    Minutes per session: 30 min  . Stress: Rather much  Relationships  . Social connections:    Talks on phone: More than three times a week    Gets together: Once a week    Attends religious service: More than 4 times per year    Active member of club or organization: Yes    Attends meetings of clubs or organizations: More than 4 times per year    Relationship status: Never married  Other Topics Concern  . Not on file  Social History Narrative  . Not on file    Allergies: No Known Allergies  Metabolic Disorder Labs: Lab Results  Component Value Date   HGBA1C 5.6 02/19/2019   MPG 114.02 02/19/2019   No results found for: PROLACTIN Lab Results  Component Value Date   CHOL 191 02/19/2019   TRIG 123 02/19/2019   HDL 52 02/19/2019   CHOLHDL 3.7 02/19/2019   VLDL 25 02/19/2019   LDLCALC 114 (H) 02/19/2019   LDLCALC 101 (H) 05/25/2017   Lab Results  Component Value Date   TSH 6.718 (H) 02/18/2019   TSH 5.370 (H) 05/25/2017    Therapeutic Level Labs: No results found for: LITHIUM No results found for: VALPROATE No components found for:  CBMZ  Current Medications: Current Outpatient Medications  Medication Sig Dispense Refill  . amphetamine-dextroamphetamine (ADDERALL) 20 MG tablet Take 20 mg by mouth 3 (three) times daily.    Dorise Hiss. Erenumab-aooe (AIMOVIG, 140 MG DOSE,) 70 MG/ML SOAJ Inject 70 mg into the skin every 30 (thirty) days.    Marland Kitchen. gabapentin (NEURONTIN) 400 MG capsule Take 400 mg by mouth 4 (four) times daily.    Marland Kitchen. lamoTRIgine (LAMICTAL) 150 MG tablet Take 150 mg by mouth 2 (two) times daily.    . Lurasidone HCl 60 MG TABS Take 1 tablet (60 mg total) by mouth daily with breakfast. 30 tablet 0  . OXcarbazepine  (TRILEPTAL) 150 MG tablet Take 1 tablet (150 mg total) by mouth at bedtime. 30 tablet 0  . prazosin (MINIPRESS) 2 MG capsule Take 2-4 mg by mouth at bedtime.    . SUMAtriptan (IMITREX) 6 MG/0.5ML SOLN injection Inject 6 mg into the skin daily as needed.    . topiramate (TOPAMAX) 100 MG tablet      No current facility-administered medications for this visit.      Musculoskeletal: Strength & Muscle Tone: within normal limits Gait & Station: N/A Patient leans: N/A  Psychiatric Specialty Exam: ROS  There were no vitals taken for this visit.There is no height or weight on file to calculate BMI.  General Appearance: Casual  Eye Contact:  Good  Speech:  Clear and  Coherent  Volume:  Normal  Mood:  Anxious and Depressed  Affect:  Congruent  Thought Process:  Coherent  Orientation:  Full (Time, Place, and Person)  Thought Content: Logical   Suicidal Thoughts:  Yes.  without intent/plan  Homicidal Thoughts:  No  Memory:  Immediate;   Fair Remote;   Fair  Judgement:  Fair  Insight:  Lacking  Psychomotor Activity:  Normal  Concentration:  Concentration: Fair  Recall:  [employer] of Knowledge: Fair  Language: Fair  Akathisia:  No  Handed:  Right  AIMS (if indicated):  Assets:  Communication Skills Desire for Improvement Resilience Social Support  ADL's:  Intact  Cognition: WNL  Sleep:  Fair   Screenings: AIMS     [facility]  AIMS Total Score  0    PHQ2-9     Counselor from 04/10/2019 in [facility] PROGRAM  PHQ-2 Total Score  4  PHQ-9 Total Score  17       Assessment and Plan:  Patient to continue with partial hospitalization program -Consider initiating Abilify for mood stabilization  Treatment plan was reviewed and agreed upon by NP T. Melvyn Neth and patient Leaundra Eernisse need for continued group services   Oneta Rack, NP 04/17/2019, 9:23 AM

## 2019-04-17 NOTE — Therapy (Signed)
[facility] PARTIAL HOSPITALIZATION PROGRAM [address] [place], [place], [address] Phone: [phone]   Fax:  [phone]  Occupational Therapy Treatment  Patient Details  Name: Shelby Gallegos MRN: [id number] Date of Birth: Dec 11, 1980 Referring Provider (OT): Hillery Jacks, NP  Virtual Visit via Video Note  I connected with Shelby Gallegos on 04/17/19 at  8:00 AM EDT by a video enabled telemedicine application and verified that I am speaking with the correct person using two identifiers.   I discussed the limitations of evaluation and management by telemedicine and the availability of in person appointments. The patient expressed understanding and agreed to proceed.   I discussed the assessment and treatment plan with the patient. The patient was provided an opportunity to ask questions and all were answered. The patient agreed with the plan and demonstrated an understanding of the instructions.   The patient was advised to call back or seek an in-person evaluation if the symptoms worsen or if the condition fails to improve as anticipated.  I provided 60 minutes of non-face-to-face time during this encounter.  Dalphine Handing, MSOT, OTR/L Behavioral Health OT/ Acute Relief OT PHP Office: [phone]  Dalphine Handing, [place]    Encounter Date: 04/17/2019  OT End of Session - 04/17/19 1430    Visit Number  4    Number of Visits  12    Date for OT Re-Evaluation  05/08/19    Authorization Type  BCBS    OT Start Time  1100    OT Stop Time  1200    OT Time Calculation (min)  60 min    Activity Tolerance  Patient tolerated treatment well    Behavior During Therapy  WFL for tasks assessed/performed       Past Medical History:  Diagnosis Date  . ADHD (attention deficit hyperactivity disorder)   . Bipolar 1 disorder (HCC)   . Migraine   . Suicide attempt by drug overdose (HCC) 02/15/2019    Past Surgical History:  Procedure Laterality Date  . NO PAST  SURGERIES    . TENDON REPAIR Bilateral 02/20/2019   Procedure: TENDON REPAIR BILATERAL FOREARMS;  Surgeon: [name], MD;  Location: [facility];  Service: Orthopedics;  Laterality: Bilateral;  . WOUND EXPLORATION Bilateral 02/20/2019   Procedure: WOUND EXPLORATION;  Surgeon: [name], MD;  Location: [facility];  Service: Orthopedics;  Laterality: Bilateral;    There were no vitals filed for this visit.  Subjective Assessment - 04/17/19 1429    Currently in Pain?  No/denies      S: "I suppress a lot of my stress"   O: Stress management group completed to use as productive coping strategy, to help mitigate maladaptive coping to integrate in functional BADL/IADL. Education given on the definition of stress and its cognitive, behavioral, emotional, and physical effects on the body. Stress symptom checklist completed to raise insight on current symptoms felt with stress. Stress management tool worksheet discussed to educate on unhealthy vs healthy coping skills to manage stress to improve community integration. Coping strategies taught include: relaxation based- deep breathing, counting to 10, taking a 1 minute vacation, acceptance, stress balls, relaxation audio/video, visual/mental imagery. Positive mental attitude- gratitude, acceptance, cognitive reframing, positive self talk, anger management. Not all education given, will be completed next date.   A: Pt presents to group with flat affect, engaged and participatory throughout session. Pt shares she often times suppresses her stress, to the point of wanting to hurt herself.  She mentions a history of punching walls to want to feel that physical pain. Reviewed healthy coping skills for self harm, such as holding ice, pt in agreeance. Further education to be given next treatment date for pt to choose preferred coping skills. Pt states she enjoys the few relaxation coping skills she had learned thus far. To be continued  and explored further next date.  P: Pt provided with education on stress management activities. OT will continue to follow up with activities learned for successful implementation into daily life. OT group will be x3 per week while pt in PHP.                    OT Education - 04/17/19 1429    Education Details  education given on stress management     Person(s) Educated  Patient    Methods  Explanation;Handout    Comprehension  Verbalized understanding       OT Short Term Goals - 04/12/19 1453      OT SHORT TERM GOAL #1   Title  Pt will be educated on strategies to improve psychosocial skills needed to participate fully in all daily, work, and leisure activities    Time  4    Period  Weeks    Status  On-going    Target Date  05/08/19      OT SHORT TERM GOAL #2   Title  Pt will apply psychosocial skills and coping mechanisms to daily activities in order to function independently and reintegrate into community    Time  4    Period  Weeks    Status  On-going    Target Date  05/08/19      OT SHORT TERM GOAL #3   Title  Pt will recall and/or apply 1-3 sleep hygiene strategies to improve BADL participation prior to reintegrating into community    Time  4    Period  Weeks    Status  On-going    Target Date  05/08/19      OT SHORT TERM GOAL #4   Title  Pt will engage in goal setting to improve BADL/IADL participation prior to reintegrating into community    Time  4    Period  Weeks    Status  On-going    Target Date  05/08/19      OT SHORT TERM GOAL #5   Title  Pt will create and/or implement functional BADL/IADL routine prior to reintegrating into community    Time  4    Period  Weeks    Status  On-going    Target Date  05/08/19               Plan - 04/17/19 1431    Occupational performance deficits (Please refer to evaluation for details):  ADL's;IADL's;Rest and Sleep;Work;Leisure;Social Participation    Body Structure / Function / Physical  Skills  ADL    Psychosocial Skills  Coping Strategies;Habits;Routines and Behaviors;Environmental  Adaptations;Interpersonal Interaction       Patient will benefit from skilled therapeutic intervention in order to improve the following deficits and impairments:  Body Structure / Function / Physical Skills, Psychosocial Skills  Visit Diagnosis: Bipolar affective disorder, depressed, severe (HCC)  Borderline personality disorder (HCC)  Difficulty coping    Problem List Patient Active Problem List   Diagnosis Date Noted  . Toxic encephalopathy 02/16/2019  . Suicide attempt (HCC)   . Overdose 02/15/2019  . Suicidal behavior with attempted self-injury (HCC)  02/15/2019  . AKI (acute kidney injury) (HCC) 02/15/2019  . Leukocytosis 02/15/2019  . Laceration of multiple sites of skin 02/15/2019  . Intentional acetaminophen overdose (HCC)   . History of suicide attempt 04/01/2017  . Borderline personality disorder (HCC) 02/16/2017  . Bipolar affective disorder, depressed, severe (HCC) 08/21/2015  . ADHD (attention deficit hyperactivity disorder) 08/21/2015  . Headache, migraine 09/28/2014  . Abnormal ECG 11/26/2009  . Essential (primary) hypertension 11/26/2009   Dalphine Handing, MSOT, OTR/L Behavioral Health OT/ Acute Relief OT PHP Office: [phone]  Dalphine Handing 04/17/2019, 2:33 PM  [facility] PARTIAL HOSPITALIZATION PROGRAM [address] [place], [place], [address] Phone: [phone]   Fax:  [phone]  Name: Shelby Gallegos MRN: [id number] Date of Birth: 07-May-1981

## 2019-04-18 ENCOUNTER — Other Ambulatory Visit (HOSPITAL_COMMUNITY): Payer: BLUE CROSS/BLUE SHIELD | Admitting: Licensed Clinical Social Worker

## 2019-04-18 ENCOUNTER — Other Ambulatory Visit: Payer: Self-pay

## 2019-04-18 ENCOUNTER — Encounter (HOSPITAL_COMMUNITY): Payer: Self-pay | Admitting: Family

## 2019-04-18 DIAGNOSIS — F314 Bipolar disorder, current episode depressed, severe, without psychotic features: Secondary | ICD-10-CM

## 2019-04-18 DIAGNOSIS — F603 Borderline personality disorder: Secondary | ICD-10-CM | POA: Diagnosis not present

## 2019-04-18 DIAGNOSIS — M25631 Stiffness of right wrist, not elsewhere classified: Secondary | ICD-10-CM | POA: Diagnosis not present

## 2019-04-18 DIAGNOSIS — M25532 Pain in left wrist: Secondary | ICD-10-CM | POA: Diagnosis not present

## 2019-04-18 DIAGNOSIS — S66921D Laceration of unspecified muscle, fascia and tendon at wrist and hand level, right hand, subsequent encounter: Secondary | ICD-10-CM | POA: Diagnosis not present

## 2019-04-18 DIAGNOSIS — Z818 Family history of other mental and behavioral disorders: Secondary | ICD-10-CM | POA: Diagnosis not present

## 2019-04-18 DIAGNOSIS — Z915 Personal history of self-harm: Secondary | ICD-10-CM | POA: Diagnosis not present

## 2019-04-18 DIAGNOSIS — S66922D Laceration of unspecified muscle, fascia and tendon at wrist and hand level, left hand, subsequent encounter: Secondary | ICD-10-CM | POA: Diagnosis not present

## 2019-04-18 DIAGNOSIS — Z79899 Other long term (current) drug therapy: Secondary | ICD-10-CM | POA: Diagnosis not present

## 2019-04-19 ENCOUNTER — Other Ambulatory Visit: Payer: Self-pay

## 2019-04-19 ENCOUNTER — Other Ambulatory Visit (HOSPITAL_COMMUNITY): Payer: BLUE CROSS/BLUE SHIELD | Admitting: Occupational Therapy

## 2019-04-19 ENCOUNTER — Other Ambulatory Visit (HOSPITAL_COMMUNITY): Payer: BLUE CROSS/BLUE SHIELD | Admitting: Licensed Clinical Social Worker

## 2019-04-19 DIAGNOSIS — Z79899 Other long term (current) drug therapy: Secondary | ICD-10-CM | POA: Diagnosis not present

## 2019-04-19 DIAGNOSIS — F603 Borderline personality disorder: Secondary | ICD-10-CM | POA: Diagnosis not present

## 2019-04-19 DIAGNOSIS — Z915 Personal history of self-harm: Secondary | ICD-10-CM | POA: Diagnosis not present

## 2019-04-19 DIAGNOSIS — R4589 Other symptoms and signs involving emotional state: Secondary | ICD-10-CM

## 2019-04-19 DIAGNOSIS — F314 Bipolar disorder, current episode depressed, severe, without psychotic features: Secondary | ICD-10-CM | POA: Diagnosis not present

## 2019-04-19 DIAGNOSIS — Z818 Family history of other mental and behavioral disorders: Secondary | ICD-10-CM | POA: Diagnosis not present

## 2019-04-19 NOTE — Progress Notes (Addendum)
Spiritual care group 04/18/2019 11:00-12:00  Facilitated by Simone Curia, MDiv, BCC   Group met via web-ex in response to COVID-19 precautions Group focused on topic of "self-care"  Patients engaged in facilitated discussion about topic.  Explored quotes related to self care and chose one which they agreed with and one which they disliked.  Engaged in discussion around quote choices and their experience / understanding of care for themselves.   Marco was persent throughout group.  Was engaged when facilitator inquired about her thoughts.  Otherwise, observant of group.  When asked about her participation by a group member,she stated, "I'm just taking it all in"  Noted her most resonant quote was "The hardest challenge is to be myself in a world that is trying to make me someone else"

## 2019-04-20 ENCOUNTER — Other Ambulatory Visit: Payer: Self-pay

## 2019-04-20 ENCOUNTER — Encounter (HOSPITAL_COMMUNITY): Payer: Self-pay

## 2019-04-20 ENCOUNTER — Other Ambulatory Visit (HOSPITAL_COMMUNITY): Payer: BLUE CROSS/BLUE SHIELD | Admitting: Occupational Therapy

## 2019-04-20 ENCOUNTER — Other Ambulatory Visit (HOSPITAL_COMMUNITY): Payer: BLUE CROSS/BLUE SHIELD | Admitting: Licensed Clinical Social Worker

## 2019-04-20 DIAGNOSIS — F603 Borderline personality disorder: Secondary | ICD-10-CM

## 2019-04-20 DIAGNOSIS — F314 Bipolar disorder, current episode depressed, severe, without psychotic features: Secondary | ICD-10-CM

## 2019-04-20 DIAGNOSIS — Z915 Personal history of self-harm: Secondary | ICD-10-CM | POA: Diagnosis not present

## 2019-04-20 DIAGNOSIS — Z818 Family history of other mental and behavioral disorders: Secondary | ICD-10-CM | POA: Diagnosis not present

## 2019-04-20 DIAGNOSIS — R4589 Other symptoms and signs involving emotional state: Secondary | ICD-10-CM

## 2019-04-20 DIAGNOSIS — Z79899 Other long term (current) drug therapy: Secondary | ICD-10-CM | POA: Diagnosis not present

## 2019-04-20 NOTE — Therapy (Signed)
[facility] PART[facility] PROGRAM [address] [place], [place], [address] Phone: [phone]   Fax:  [phone]  Occupational Therapy Treatment  Patient Details  Name: Shelby Gallegos MRN: [id number] Date of Birth: 12-Nov-1981 Referring Provider (OT): Hillery Jacksanika Lewis, NP   Virtual Visit via Video Note  I connected with Shelby MuirKelli Atilano on 04/20/19 at  8:00 AM EDT by a video enabled telemedicine application and verified that I am speaking with the correct person using two identifiers.   I discussed the limitations of evaluation and management by telemedicine and the availability of in person appointments. The patient expressed understanding and agreed to proceed.  I discussed the assessment and treatment plan with the patient. The patient was provided an opportunity to ask questions and all were answered. The patient agreed with the plan and demonstrated an understanding of the instructions.   The patient was advised to call back or seek an in-person evaluation if the symptoms worsen or if the condition fails to improve as anticipated.  I provided 60 minutes of non-face-to-face time during this encounter.  Dalphine HandingKaylee Dontae Minerva, MSOT, OTR/L Behavioral Health OT/ Acute Relief OT PHP Office: [phone]  Dalphine HandingKaylee Ronzell [place], [place]    Encounter Date: 04/20/2019  OT End of Session - 04/20/19 1353    Visit Number  6    Number of Visits  12    Date for OT Re-Evaluation  05/08/19    Authorization Type  BCBS    OT Start Time  1100    OT Stop Time  1200    OT Time Calculation (min)  60 min    Activity Tolerance  Patient tolerated treatment well    Behavior During Therapy  [facility] for tasks assessed/performed       Past Medical History:  Diagnosis Date  . ADHD (attention deficit hyperactivity disorder)   . Bipolar 1 disorder (HCC)   . Migraine   . Suicide attempt by drug overdose (HCC) 02/15/2019    Past Surgical History:  Procedure Laterality Date  . NO PAST  SURGERIES    . TENDON REPAIR Bilateral 02/20/2019   Procedure: TENDON REPAIR BILATERAL FOREARMS;  Surgeon: Betha LoaKuzma, Kevin, MD;  Location: [facility];  Service: Orthopedics;  Laterality: Bilateral;  . WOUND EXPLORATION Bilateral 02/20/2019   Procedure: WOUND EXPLORATION;  Surgeon: Betha LoaKuzma, Kevin, MD;  Location: [facility];  Service: Orthopedics;  Laterality: Bilateral;    There were no vitals filed for this visit.  Subjective Assessment - 04/20/19 1353    Currently in Pain?  No/denies        S: "I need to improve my environmental, emotional, and social wellness"   O: Education provided on health and wellness in the physical and mental aspect and how those connections positively affect the body/mind and the ability to appropriately engage in BADL/IADL/community. Definitions on mental and physical health given. Group discussion given on ways to facilitate positive mental health and physical health per pt current level. Self-esteem and awareness of emotions discussed to help further facilitate well rounded health and wellness. Pt brainstormed ways to increase physical activity per current level and comforts.  A: Pt presents with flat affect, engaged and participatory throughout session. Pt shares she wants to improve her environmental, emotional, and social wellness. She shares environmental has been a struggle, since having to move home with her parents after living alone for 12 years. Pt aspires to live independently again in the future.  P: OT group will be x3 per  week while in PHP.                    OT Education - 04/20/19 1353    Education Details  education given on health/wellness and occupational balance    Person(s) Educated  Patient    Methods  Explanation;Handout    Comprehension  Verbalized understanding       OT Short Term Goals - 04/12/19 1453      OT SHORT TERM GOAL #1   Title  Pt will be educated on strategies to improve  psychosocial skills needed to participate fully in all daily, work, and leisure activities    Time  4    Period  Weeks    Status  On-going    Target Date  05/08/19      OT SHORT TERM GOAL #2   Title  Pt will apply psychosocial skills and coping mechanisms to daily activities in order to function independently and reintegrate into community    Time  4    Period  Weeks    Status  On-going    Target Date  05/08/19      OT SHORT TERM GOAL #3   Title  Pt will recall and/or apply 1-3 sleep hygiene strategies to improve BADL participation prior to reintegrating into community    Time  4    Period  Weeks    Status  On-going    Target Date  05/08/19      OT SHORT TERM GOAL #4   Title  Pt will engage in goal setting to improve BADL/IADL participation prior to reintegrating into community    Time  4    Period  Weeks    Status  On-going    Target Date  05/08/19      OT SHORT TERM GOAL #5   Title  Pt will create and/or implement functional BADL/IADL routine prior to reintegrating into community    Time  4    Period  Weeks    Status  On-going    Target Date  05/08/19               Plan - 04/20/19 1354    Occupational performance deficits (Please refer to evaluation for details):  ADL's;IADL's;Rest and Sleep;Work;Leisure;Social Participation    Body Structure / Function / Physical Skills  ADL    Psychosocial Skills  Coping Strategies;Habits;Routines and Behaviors;Environmental  Adaptations;Interpersonal Interaction       Patient will benefit from skilled therapeutic intervention in order to improve the following deficits and impairments:  Body Structure / Function / Physical Skills, Psychosocial Skills  Visit Diagnosis: Bipolar affective disorder, depressed, severe (HCC)  Borderline personality disorder (HCC)  Difficulty coping    Problem List Patient Active Problem List   Diagnosis Date Noted  . Toxic encephalopathy 02/16/2019  . Suicide attempt (HCC)   . Overdose  02/15/2019  . Suicidal behavior with attempted self-injury (HCC) 02/15/2019  . AKI (acute kidney injury) (HCC) 02/15/2019  . Leukocytosis 02/15/2019  . Laceration of multiple sites of skin 02/15/2019  . Intentional acetaminophen overdose (HCC)   . History of suicide attempt 04/01/2017  . Borderline personality disorder (HCC) 02/16/2017  . Bipolar affective disorder, depressed, severe (HCC) 08/21/2015  . ADHD (attention deficit hyperactivity disorder) 08/21/2015  . Headache, migraine 09/28/2014  . Abnormal ECG 11/26/2009  . Essential (primary) hypertension 11/26/2009   Dalphine Handing, MSOT, OTR/L Behavioral Health OT/ Acute Relief OT PHP Office: [phone]  Dalphine Handing 04/20/2019, 1:55  PM  [facility] PARTIAL HOSPITALIZATION PROGRAM [address] [place], [place], [address] Phone: [phone]   Fax:  [phone]  Name: Maclynn Tolley MRN: [id number] Date of Birth: 1980/12/01

## 2019-04-20 NOTE — Therapy (Signed)
[facility] PARTIAL HOSPITALIZATION PROGRAM [address] [place], [place], [address] Phone: [phone]   Fax:  [phone]  Occupational Therapy Treatment  Patient Details  Name: Shelby Gallegos MRN: [id number] Date of Birth: 06-27-1981 Referring Provider (OT): Hillery Jacks, NP  Virtual Visit via Video Note  I connected with Shelby Gallegos on 04/20/19 at  8:00 AM EDT by a video enabled telemedicine application and verified that I am speaking with the correct person using two identifiers.   I discussed the limitations of evaluation and management by telemedicine and the availability of in person appointments. The patient expressed understanding and agreed to proceed.  I discussed the assessment and treatment plan with the patient. The patient was provided an opportunity to ask questions and all were answered. The patient agreed with the plan and demonstrated an understanding of the instructions.   The patient was advised to call back or seek an in-person evaluation if the symptoms worsen or if the condition fails to improve as anticipated.  I provided 60 minutes of non-face-to-face time during this encounter.  Dalphine Handing, MSOT, OTR/L Behavioral Health OT/ Acute Relief OT PHP Office: [phone]  Dalphine Handing, [place]    Encounter Date: 04/19/2019  OT End of Session - 04/20/19 1234    Visit Number  5    Number of Visits  12    Date for OT Re-Evaluation  05/08/19    Authorization Type  BCBS    OT Start Time  1100    OT Stop Time  1200    OT Time Calculation (min)  60 min    Activity Tolerance  Patient tolerated treatment well    Behavior During Therapy  [facility] for tasks assessed/performed       Past Medical History:  Diagnosis Date  . ADHD (attention deficit hyperactivity disorder)   . Bipolar 1 disorder (HCC)   . Migraine   . Suicide attempt by drug overdose (HCC) 02/15/2019    Past Surgical History:  Procedure Laterality Date  . NO PAST  SURGERIES    . TENDON REPAIR Bilateral 02/20/2019   Procedure: TENDON REPAIR BILATERAL FOREARMS;  Surgeon: [name], MD;  Location: [facility];  Service: Orthopedics;  Laterality: Bilateral;  . WOUND EXPLORATION Bilateral 02/20/2019   Procedure: WOUND EXPLORATION;  Surgeon: [name], MD;  Location: [facility];  Service: Orthopedics;  Laterality: Bilateral;    There were no vitals filed for this visit.  Subjective Assessment - 04/20/19 1233    Currently in Pain?  No/denies       S: "I suppress a lot of my stress"  O: Continued education given on stress management from previous treatment date (see previous treatment note for explanation). Pt to choose coping skills to begin implementing in daily life.  A: Pt presents with flat affect, engaged and participatory throughout session. Pt again shares how she suppresses her stress mostly commonly. She shares how she would like to practice gratitude journaling and begin planning good thing for her future to help boost a positive mental attitude. Pt also uses rubber rings on fingers as "fidgets" to help manage stress and keep hand busy when anxious.  P: OT group will be x3 per week while pt in PHP. OT will follow up on skills learned to ensure implementation and understanding.                    OT Education - 04/20/19 1233    Education Details  cont education given on stress management    Person(s) Educated  Patient    Methods  Explanation;Handout    Comprehension  Verbalized understanding       OT Short Term Goals - 04/12/19 1453      OT SHORT TERM GOAL #1   Title  Pt will be educated on strategies to improve psychosocial skills needed to participate fully in all daily, work, and leisure activities    Time  4    Period  Weeks    Status  On-going    Target Date  05/08/19      OT SHORT TERM GOAL #2   Title  Pt will apply psychosocial skills and coping mechanisms to daily activities  in order to function independently and reintegrate into community    Time  4    Period  Weeks    Status  On-going    Target Date  05/08/19      OT SHORT TERM GOAL #3   Title  Pt will recall and/or apply 1-3 sleep hygiene strategies to improve BADL participation prior to reintegrating into community    Time  4    Period  Weeks    Status  On-going    Target Date  05/08/19      OT SHORT TERM GOAL #4   Title  Pt will engage in goal setting to improve BADL/IADL participation prior to reintegrating into community    Time  4    Period  Weeks    Status  On-going    Target Date  05/08/19      OT SHORT TERM GOAL #5   Title  Pt will create and/or implement functional BADL/IADL routine prior to reintegrating into community    Time  4    Period  Weeks    Status  On-going    Target Date  05/08/19               Plan - 04/20/19 1234    Occupational performance deficits (Please refer to evaluation for details):  ADL's;IADL's;Rest and Sleep;Work;Leisure;Social Participation    Body Structure / Function / Physical Skills  ADL    Psychosocial Skills  Coping Strategies;Habits;Routines and Behaviors;Environmental  Adaptations;Interpersonal Interaction       Patient will benefit from skilled therapeutic intervention in order to improve the following deficits and impairments:  Body Structure / Function / Physical Skills, Psychosocial Skills  Visit Diagnosis: Bipolar affective disorder, depressed, severe (HCC)  Borderline personality disorder (HCC)  Difficulty coping    Problem List Patient Active Problem List   Diagnosis Date Noted  . Toxic encephalopathy 02/16/2019  . Suicide attempt (HCC)   . Overdose 02/15/2019  . Suicidal behavior with attempted self-injury (HCC) 02/15/2019  . AKI (acute kidney injury) (HCC) 02/15/2019  . Leukocytosis 02/15/2019  . Laceration of multiple sites of skin 02/15/2019  . Intentional acetaminophen overdose (HCC)   . History of suicide attempt  04/01/2017  . Borderline personality disorder (HCC) 02/16/2017  . Bipolar affective disorder, depressed, severe (HCC) 08/21/2015  . ADHD (attention deficit hyperactivity disorder) 08/21/2015  . Headache, migraine 09/28/2014  . Abnormal ECG 11/26/2009  . Essential (primary) hypertension 11/26/2009   Dalphine HandingKaylee Chaylee Ehrsam, MSOT, OTR/L Behavioral Health OT/ Acute Relief OT PHP Office: [phone]  Dalphine HandingKaylee Makalyn Lennox 04/20/2019, 12:35 PM  [facility] PART[facility] PROGRAM [address] [place], [place], [address] Phone: [phone]   Fax:  [phone]  Name: Shelby MuirKelli Gallegos MRN: [id number] Date of Birth: 1981/04/08

## 2019-04-23 ENCOUNTER — Other Ambulatory Visit (HOSPITAL_COMMUNITY): Payer: BLUE CROSS/BLUE SHIELD

## 2019-04-24 ENCOUNTER — Other Ambulatory Visit (HOSPITAL_COMMUNITY): Payer: BLUE CROSS/BLUE SHIELD | Admitting: Licensed Clinical Social Worker

## 2019-04-24 ENCOUNTER — Encounter (HOSPITAL_COMMUNITY): Payer: Self-pay | Admitting: Occupational Therapy

## 2019-04-24 ENCOUNTER — Encounter (HOSPITAL_COMMUNITY): Payer: Self-pay | Admitting: Family

## 2019-04-24 ENCOUNTER — Other Ambulatory Visit (HOSPITAL_COMMUNITY): Payer: BLUE CROSS/BLUE SHIELD | Admitting: Occupational Therapy

## 2019-04-24 ENCOUNTER — Other Ambulatory Visit: Payer: Self-pay

## 2019-04-24 DIAGNOSIS — F603 Borderline personality disorder: Secondary | ICD-10-CM

## 2019-04-24 DIAGNOSIS — F314 Bipolar disorder, current episode depressed, severe, without psychotic features: Secondary | ICD-10-CM

## 2019-04-24 DIAGNOSIS — R4589 Other symptoms and signs involving emotional state: Secondary | ICD-10-CM

## 2019-04-24 DIAGNOSIS — Z818 Family history of other mental and behavioral disorders: Secondary | ICD-10-CM | POA: Diagnosis not present

## 2019-04-24 DIAGNOSIS — Z915 Personal history of self-harm: Secondary | ICD-10-CM | POA: Diagnosis not present

## 2019-04-24 DIAGNOSIS — Z79899 Other long term (current) drug therapy: Secondary | ICD-10-CM | POA: Diagnosis not present

## 2019-04-24 NOTE — Psych (Signed)
Virtual Visit via Video Note  I connected with Shelby Gallegos on 04/10/19 at  9:00 AM EDT by a video enabled telemedicine application and verified that I am speaking with the correct person using two identifiers.   I discussed the limitations of evaluation and management by telemedicine and the availability of in person appointments. The patient expressed understanding and agreed to proceed.  I discussed the assessment and treatment plan with the patient. The patient was provided an opportunity to ask questions and all were answered. The patient agreed with the plan and demonstrated an understanding of the instructions.   The patient was advised to call back or seek an in-person evaluation if the symptoms worsen or if the condition fails to improve as anticipated.  Pt was provided 240 minutes of non-face-to-face time during this encounter.   Donia GuilesJenny Lestine Rahe, LCSW    [facility] THERAPIST PROGRESS NOTE  Shelby Gallegos [id number]  Session Time: 9:00 - 10:00  Participation Level: Active  Behavioral Response: CasualAlertDepressed  Type of Therapy: Group Therapy  Treatment Goals addressed: Coping  Interventions: CBT, DBT, Solution Focused, Supportive and Reframing  Summary: Clinician led check-in regarding current stressors and situation, and review of patient completed daily inventory. Clinician utilized active listening and empathetic response and validated patient emotions. Clinician facilitated processing group on pertinent issues.   Therapist Response: Shelby Gallegos is a 38 y.o. female who presents with depression and personality symotoms. Patient arrived within time allowed and reports that she is feeling "okay." Patient rates her mood at a 6 on a scale of 1-10 with 10 being great. Pt reports she has been struggling with ruminating since group yesterday. Pt reports having passive SI, denying plan and intent.  Pt able to process. Pt engaged in discussion.        Session Time:  10:00 -11:00  Participation Level:Active  Behavioral Response:CasualAlertDepressed  Type of Therapy: Group Therapy, psychoeducation, psychotherapy  Treatment Goals addressed: Coping  Interventions:CBT, DBT, Solution Focused, Supportive and Reframing  Summary:Cln led discussion on life amid restrictions from COVID-19 pandemic. Group discussed how it is affecting them the longer it continues. Group discussed ways to be kind to ourselves during this time.    Therapist Response: Ptshares one good thing about the stay at home orders is more easily having time for treatment and recognizes that if life was "normal" she would feel more conflicted about taking this time. Pt reports she tries to avoid the news as it raises her anxiety. Pt states she is trying to remind herself "we're all in the same boat."       Session Time: 11:00 - 12:00  Participation Level:Active  Behavioral Response:CasualAlertDepressed  Type of Therapy: Group Therapy, OT  Treatment Goals addressed: Coping  Interventions:Psychosocial skills training, Supportive,   Summary:Occupational Therapy group  Therapist Response: Patient engaged in group. See OT note.       Session Time: 12:00- 1:00  Participation Level:Active  Behavioral Response:CasualAlertDepressed  Type of Therapy: Group Therapy, Psychoeducation; Psychotherapy  Treatment Goals addressed: Coping  Interventions:CBT; Solution focused; Supportive; Reframing  Summary:12:00 - 12:50Cln continued topic of distress tolerance skills and reviewed from yesterday. Cln provided education on ACCEPTS skills. Group brainstormed ways to practice each skill and discussed how they can utilize it in their every day lives.  12:50 -1:00 Clinician led check-out. Clinician assessed for immediate needs, medication compliance and efficacy, and safety concerns   Therapist Response: Pt engaged in discussion. Pt was able  to identify coloring, games on her phone, solitaire,  and music as ways to utilize ACCEPTS. At checkout, pt rates her mood at [facility] a scale of 1-10 with 10 being great. Patient reports afternoon plans of going to bible study and trying to stay busy. Pt demonstrates some progress as evidenced by increased openness in group.Patient denies SI/HI/self-harm at the end of group.      Suicidal/Homicidal: Nowithout intent/plan   Plan: Pt will continue in PHP while working to decrease depression symptoms, decrease SI, and increase ability to manage symptoms in a healthy manner as they arise.   Diagnosis: Bipolar affective disorder, depressed, severe (HCC) [F31.4]    1. Bipolar affective disorder, depressed, severe (HCC)   2. Borderline personality disorder (HCC)       [name], LCSW 04/24/2019

## 2019-04-24 NOTE — Psych (Signed)
Virtual Visit via Video Note  I connected with Shelby Gallegos on 04/11/19 at  9:00 AM EDT by a video enabled telemedicine application and verified that I am speaking with the correct person using two identifiers.   I discussed the limitations of evaluation and management by telemedicine and the availability of in person appointments. The patient expressed understanding and agreed to proceed.  I discussed the assessment and treatment plan with the patient. The patient was provided an opportunity to ask questions and all were answered. The patient agreed with the plan and demonstrated an understanding of the instructions.   The patient was advised to call back or seek an in-person evaluation if the symptoms worsen or if the condition fails to improve as anticipated.  Pt was provided 240 minutes of non-face-to-face time during this encounter.   Shelby Guiles, LCSW    [facility] PHP THERAPIST PROGRESS NOTE  Shelby Gallegos [id number]  Session Time: 9:00 - 10:00  Participation Level: Active  Behavioral Response: CasualAlertDepressed  Type of Therapy: Group Therapy  Treatment Goals addressed: Coping  Interventions: CBT, DBT, Solution Focused, Supportive and Reframing  Summary: Clinician led check-in regarding current stressors and situation, and review of patient completed daily inventory. Clinician utilized active listening and empathetic response and validated patient emotions. Clinician facilitated processing group on pertinent issues.   Therapist Response: Shelby Gallegos is a 38 y.o. female who presents with depression and personality symotoms. Patient arrived within time allowed and reports that she is "not feeling great." Patient rates her mood at a 6 on a scale of 1-10 with 10 being great. Pt reports she has a headache that is causing her problems and effecting her vision. Pt states she "woke up in my feelings" due to an argument with her mom yesterday. Pt states her mom wanted her to stop  physical therapy and pt became upset. Pt reports it is a struggle to be an adult living back with her parents. Pt states this morning things were fine between her and mom which helped pt deescalate. Pt reports struggling with managing moods when she is upset. Pt able to process. Pt engaged in discussion.         Session Time: 10:00 -11:00  Participation Level:Active  Behavioral Response:CasualAlertDepressed  Type of Therapy: Group Therapy, psychoeducation, psychotherapy  Treatment Goals addressed: Coping  Interventions:CBT, DBT, Solution Focused, Supportive and Reframing  Summary:Cln led discussion on perspective. Cln shared ways in which perception can alter the way we see our reality and the importance of working to make perceptions more accurate. Group discussed ways in which their perceptions have hindered progress and neutrality/positivity.   Therapist Response: Patient states "it's really helpful to be able to look at things differently because I know I don't see things great all the time." Pt reports she will try to the best friend test when she needs a perspective shift.       Session Time: 11:00 -12:00  Participation Level:Active  Behavioral Response:CasualAlertDepressed  Type of Therapy: Group Therapy, psychotherapy  Treatment Goals addressed: Coping  Interventions:Strengths based, reframing, Supportive,   Summary:Spiritual Care group  Therapist Response: Patient engaged in group. See chaplain note.       Session Time: 12:00- 1:00  Participation Level:Active  Behavioral Response:CasualAlertDepressed  Type of Therapy: Group Therapy, Psychoeducation  Treatment Goals addressed: Coping  Interventions:relaxation training; Supportive; Reframing  Summary:12:00 - 12:50: Relaxation group: Cln led group focused on retraining the body's response to stress.  12:50 -1:00 Clinician led check-out. Clinician assessed  for immediate needs, medication compliance and efficacy, and safety concerns  Therapist Response:Pt engaged in activity and discussion. At checkout, pt rates her mood at a7.5on a scale of 1-10 with 10 being great. Patient reports afternoon plans of going to physical therapy and spending the night with a friend. Pt demonstrates some progress as evidenced by increased insight into feelings.Patient denies SI/HI/self-harm at the end of group.      Suicidal/Homicidal: Nowithout intent/plan   Plan: Pt will continue in PHP while working to decrease depression symptoms, decrease SI, and increase ability to manage symptoms in a healthy manner as they arise.   Diagnosis: Bipolar affective disorder, depressed, severe (HCC) [F31.4]    1. Bipolar affective disorder, depressed, severe (HCC)   2. Borderline personality disorder (HCC)       Shelby GuilesJenny Kimbly Eanes, LCSW 04/24/2019

## 2019-04-24 NOTE — Therapy (Signed)
[facility] [address] [place], [place], [address] Phone: [phone]   Fax:  [phone]  Occupational Therapy Treatment  Patient Details  Name: Shelby Gallegos MRN: [id number] Date of Birth: [birth year] Referring Provider (OT): Hillery Jacksanika Lewis, NP  Virtual Visit via Video Note  I connected with Shelby Gallegos on 04/24/19 at  8:00 AM EDT by a video enabled telemedicine application and verified that I am speaking with the correct person using two identifiers.   I discussed the limitations of evaluation and management by telemedicine and the availability of in person appointments. The patient expressed understanding and agreed to proceed.  I discussed the assessment and treatment plan with the patient. The patient was provided an opportunity to ask questions and all were answered. The patient agreed with the plan and demonstrated an understanding of the instructions.   The patient was advised to call back or seek an in-person evaluation if the symptoms worsen or if the condition fails to improve as anticipated.  I provided 60 minutes of non-face-to-face time during this encounter.  Dalphine HandingKaylee Lovenia Debruler, MSOT, OTR/L Behavioral Health OT/ Acute Relief OT PHP Office: [phone]  Dalphine HandingKaylee Caro [place], [place]    Encounter Date: 04/24/2019  OT End of Session - 04/24/19 1344    Visit Number  7    Number of Visits  12    Date for OT Re-Evaluation  05/08/19    Authorization Type  [employer]    OT Start Time  1100    OT Stop Time  1200    OT Time Calculation (min)  60 min    Activity Tolerance  Patient tolerated treatment well    Behavior During Therapy  [facility] for tasks assessed/performed       Past Medical History:  Diagnosis Date  . ADHD (attention deficit hyperactivity disorder)   . Bipolar 1 disorder (HCC)   . Migraine   . Suicide attempt by drug overdose (HCC) 02/15/2019    Past Surgical History:  Procedure Laterality Date  . NO PAST  SURGERIES    . TENDON REPAIR Bilateral 02/20/2019   Procedure: TENDON REPAIR BILATERAL FOREARMS;  Surgeon: Betha LoaKuzma, Kevin, MD;  Location: [facility];  Service: Orthopedics;  Laterality: Bilateral;  . WOUND EXPLORATION Bilateral 02/20/2019   Procedure: WOUND EXPLORATION;  Surgeon: Betha LoaKuzma, Kevin, MD;  Location: [facility];  Service: Orthopedics;  Laterality: Bilateral;    There were no vitals filed for this visit.  Subjective Assessment - 04/24/19 1343    Currently in Pain?  No/denies         S: "I need to improve my healthy thinking and self esteem"   O: Education given on protective factors and their importance in building resiliency to face difficult life challenges. Protective factors worksheet completed. Pt to rate current protective factors of social support, coping skills, physical health, sense of purpose, self-esteem, and healthy thinking on a scale from weak-moderate-strong. Pt then to identify the most valuable protective factor, 2 protective factors to improve, and specific goals to accomplish this task.  A: Pt presents with blunted affected, engaged and participatory throughout session. Pt shares how she needs to improve healthy thinking and self esteem. She plans to do this by working to see the positive in every situation. She shares how she feels relatively strong in social support and how she turns to this in difficult times (ex. Staying with family after her recent suicide attempt).  P: OT group will be x3 per week  while pt in [facility]               OT Education - 04/24/19 1344    Education Details  education given on protective factors    Person(s) Educated  Patient    Methods  Explanation;Handout    Comprehension  Verbalized understanding       OT Short Term Goals - 04/12/19 1453      OT SHORT TERM GOAL #1   Title  Pt will be educated on strategies to improve psychosocial skills needed to participate fully in all daily, work, and  leisure activities    Time  4    Period  Weeks    Status  On-going    Target Date  05/08/19      OT SHORT TERM GOAL #2   Title  Pt will apply psychosocial skills and coping mechanisms to daily activities in order to function independently and reintegrate into community    Time  4    Period  Weeks    Status  On-going    Target Date  05/08/19      OT SHORT TERM GOAL #3   Title  Pt will recall and/or apply 1-3 sleep hygiene strategies to improve BADL participation prior to reintegrating into community    Time  4    Period  Weeks    Status  On-going    Target Date  05/08/19      OT SHORT TERM GOAL #4   Title  Pt will engage in goal setting to improve BADL/IADL participation prior to reintegrating into community    Time  4    Period  Weeks    Status  On-going    Target Date  05/08/19      OT SHORT TERM GOAL #5   Title  Pt will create and/or implement functional BADL/IADL routine prior to reintegrating into community    Time  4    Period  Weeks    Status  On-going    Target Date  05/08/19               Plan - 04/24/19 1344    Occupational performance deficits (Please refer to evaluation for details):  ADL's;IADL's;Rest and Sleep;Work;Leisure;Social Participation    Body Structure / Function / Physical Skills  ADL    Psychosocial Skills  Coping Strategies;Habits;Routines and Behaviors;Environmental  Adaptations;Interpersonal Interaction       Patient will benefit from skilled therapeutic intervention in order to improve the following deficits and impairments:  Body Structure / Function / Physical Skills, Psychosocial Skills  Visit Diagnosis: Bipolar affective disorder, depressed, severe (HCC)  Borderline personality disorder (HCC)  Difficulty coping    Problem List Patient Active Problem List   Diagnosis Date Noted  . Toxic encephalopathy 02/16/2019  . Suicide attempt (HCC)   . Overdose 02/15/2019  . Suicidal behavior with attempted self-injury (HCC)  02/15/2019  . AKI (acute kidney injury) (HCC) 02/15/2019  . Leukocytosis 02/15/2019  . Laceration of multiple sites of skin 02/15/2019  . Intentional acetaminophen overdose (HCC)   . History of suicide attempt 04/01/2017  . Borderline personality disorder (HCC) 02/16/2017  . Bipolar affective disorder, depressed, severe (HCC) 08/21/2015  . ADHD (attention deficit hyperactivity disorder) 08/21/2015  . Headache, migraine 09/28/2014  . Abnormal ECG 11/26/2009  . Essential (primary) hypertension 11/26/2009   Dalphine Handing, MSOT, OTR/L Behavioral Health OT/ Acute Relief OT PHP Office: [phone]  Dalphine Handing 04/24/2019, 1:46 PM  [place] [facility]  HOSPITALIZATION PROGRAM [address] [place], [place], [address] Phone: [phone]   Fax:  [phone]  Name: Shelby Gallegos MRN: [id number] Date of Birth: Apr 18, 1981

## 2019-04-24 NOTE — Psych (Signed)
Virtual Visit via Video Note  I connected with Shelby Gallegos on 04/09/19 at  9:00 AM EDT by a video enabled telemedicine application and verified that I am speaking with the correct person using two identifiers.   I discussed the limitations of evaluation and management by telemedicine and the availability of in person appointments. The patient expressed understanding and agreed to proceed.  I discussed the assessment and treatment plan with the patient. The patient was provided an opportunity to ask questions and all were answered. The patient agreed with the plan and demonstrated an understanding of the instructions.   The patient was advised to call back or seek an in-person evaluation if the symptoms worsen or if the condition fails to improve as anticipated.  Pt was provided 240 minutes of non-face-to-face time during this encounter.   Shelby GuilesJenny Jacquees Gongora, LCSW    [facility] PHP THERAPIST PROGRESS NOTE  Shelby Gallegos [id number]  Session Time: 9:00 - 10:00  Participation Level: Active  Behavioral Response: CasualAlertDepressed  Type of Therapy: Group Therapy  Treatment Goals addressed: Coping  Interventions: CBT, DBT, Solution Focused, Supportive and Reframing  Summary: Clinician led check-in regarding current stressors and situation, and review of patient completed daily inventory. Clinician utilized active listening and empathetic response and validated patient emotions. Clinician facilitated processing group on pertinent issues.   Therapist Response: Shelby Gallegos is a 38 y.o. female who presents with depression and personality symotoms. Patient arrived within time allowed and reports that she is feeling "in a pretty good place right now." Patient rates her mood at a 7 on a scale of 1-10 with 10 being great. Pt reports her weekend was okay and she spent time with her friend and at home with her parents. Pt reports feeling low energy which is different for her, however states she is  mainly happy to be connected back to a group. Pt able to process. Pt engaged in discussion.        Session Time: 10:00 -11:00  Participation Level: Active  Behavioral Response: CasualAlertDepressed  Type of Therapy: Group Therapy, psychoeducation, psychotherapy  Treatment Goals addressed: Coping  Interventions: CBT, DBT, Solution Focused, Supportive and Reframing  Summary: Cln led discussion on acceptance and how it can lessen our emotional distress. Group discussed struggles they have with acceptance and how the concept could impact their situations.    Therapist Response: Pt shares she struggles to not have control in situations and "can see how that could be helpful" in regards to acceptance.      Session Time: 11:00 -12:00  Participation Level: Active  Behavioral Response: CasualAlertDepressed  Type of Therapy: Group Therapy, psychoeducation, psychotherapy  Treatment Goals addressed: Coping  Interventions: CBT, DBT, Solution Focused, Supportive and Reframing  Summary: Cln introduced distress tolerance skills, educating on their purpose and how to practice them. Cln introduced STOP skill and group discussed how they could utilize this skill in their every day life.    Therapist Response: Pt reports understanding of skills discussed. Pt identifies STOP skill as a helpful one to use when she is ruminating.     Session Time: 12:00- 1:00  Participation Level: Active  Behavioral Response: CasualAlertDepressed  Type of Therapy: Group Therapy, Psychoeducation; Psychotherapy  Treatment Goals addressed: Coping  Interventions: CBT; Solution focused; Supportive; Reframing  Summary: 12:00 - 12:50 Cln continued topic of distress tolerance skills. Cln introduced TIP skill and group discussed how to implement the skill in their every day life.  12:50 -1:00 Clinician led check-out. Clinician assessed for  immediate needs, medication compliance  and efficacy, and safety concerns   Therapist Response: Pt reports understanding of TIPP skills. Pt identifies Paced Breathing as the one she is most likely to utilize.   At checkout, pt rates her mood at [facility] a scale of 1-10 with 10 being great. Patient reports afternoon plans of hanging out with parents and coloring. Pt demonstrates some progress as evidenced by participating in first group session.Patient denies SI/HI/self-harm at the end of group.      Suicidal/Homicidal: Nowithout intent/plan   Plan: Pt will continue in PHP while working to decrease depression symptoms, decrease SI, and increase ability to manage symptoms in a healthy manner as they arise.   Diagnosis: Bipolar affective disorder, depressed, severe (HCC) [F31.4]    1. Bipolar affective disorder, depressed, severe (HCC)   2. Borderline personality disorder (HCC)       Shelby Guiles, LCSW 04/24/2019

## 2019-04-24 NOTE — Psych (Signed)
Virtual Visit via Video Note  I connected with Shelby Gallegos Goodwyn on 04/13/19 at  9:00 AM EDT by a video enabled telemedicine application and verified that I am speaking with the correct person using two identifiers.   I discussed the limitations of evaluation and management by telemedicine and the availability of in person appointments. The patient expressed understanding and agreed to proceed.  I discussed the assessment and treatment plan with the patient. The patient was provided an opportunity to ask questions and all were answered. The patient agreed with the plan and demonstrated an understanding of the instructions.   The patient was advised to call back or seek an in-person evaluation if the symptoms worsen or if the condition fails to improve as anticipated.  Pt was provided 240 minutes of non-face-to-face time during this encounter.   Donia GuilesJenny Laquesha Holcomb, LCSW    [facility] PHP THERAPIST PROGRESS NOTE  Shelby Gallegos Schrag [id number]  Session Time: 9:00 - 10:00  Participation Level: Active  Behavioral Response: CasualAlertDepressed  Type of Therapy: Group Therapy  Treatment Goals addressed: Coping  Interventions: CBT, DBT, Solution Focused, Supportive and Reframing  Summary: Clinician led check-in regarding current stressors and situation, and review of patient completed daily inventory. Clinician utilized active listening and empathetic response and validated patient emotions. Clinician facilitated processing group on pertinent issues.   Therapist Response: Shelby Gallegos Thibeau is a 38 y.o. female who presents with depression and personality symotoms. Patient arrived within time allowed and reports that she is feeling "pretty good." Patient rates her mood at a 7.5 on a scale of 1-10 with 10 being great. Pt reports she slept well and is optimistic about her weekend. Pt reports she is also concerned about her weekend as [place] and this is the first whole family event  since she has been in the hospital. Pt expresses concern regarding how to handle any uncomfortable questions regarding her hospitalization. Pt able to process. Pt engaged in discussion.        Session Time: 10:00 -11:00  Participation Level:Active  Behavioral Response:CasualAlertDepressed  Type of Therapy: Group Therapy, psychoeducation, psychotherapy  Treatment Goals addressed: Coping  Interventions:CBT, DBT, Solution Focused, Supportive and Reframing  Summary:Cln led discussion on implementation of distress tolerance skills. Cln made connection to pt's struggles with how skills could be helpful and brought awareness to things they have done that are skillfull. Group shared ways in which they have practiced the skills over this week intentionally.    Therapist Response: Ptreports she has been trying out STOP when she is ruminating and coloring to distract herself. Pt participates in brain storming of other ways to distract.        Session Time: 11:00 - 12:00  Participation Level:Active  Behavioral Response:CasualAlertDepressed  Type of Therapy: Group Therapy, OT  Treatment Goals addressed: Coping  Interventions:Psychosocial skills training, Supportive,   Summary:Occupational Therapy group  Therapist Response: Patient engaged in group. See OT note.       Session Time: 12:00- 1:00  Participation Level:Active  Behavioral Response:CasualAlertDepressed  Type of Therapy: Group Therapy, Psychoeducation; Psychotherapy  Treatment Goals addressed: Coping  Interventions:CBT; Solution focused; Supportive; Reframing  Summary:12:00 - 12:50Clinician introduced topic of cognitive distortions. Cln educated on what cognitive distortions are and how they affect us. Cln introduced "Catch, Challenge, Change" and group began to review cognitive distortion handout and came up with examples to work on "catch." 12:50 -1:00 Clinician  led check-out. Clinician assessed for immediate needs, medication compliance and efficacy,  and safety concerns   Therapist Response: Pt reports understanding of topic and is able to identify examples from her life.  At checkout, pt rates her mood at a7.5on a scale of 1-10 with 10 being great. Patient reports afternoon plans [place] with her family. Pt demonstrates some progress as evidenced by making practical decisions to plan ahead for anticipated rough patches in her weekend.Patient denies SI/HI/self-harm at the end of group.      Suicidal/Homicidal: Nowithout intent/plan   Plan: Pt will continue in PHP while working to decrease depression symptoms, decrease SI, and increase ability to manage symptoms in a healthy manner as they arise.   Diagnosis: Bipolar affective disorder, depressed, severe (HCC) [F31.4]    1. Bipolar affective disorder, depressed, severe (HCC)   2. Borderline personality disorder (HCC)       [name], LCSW 04/24/2019

## 2019-04-25 ENCOUNTER — Other Ambulatory Visit (HOSPITAL_COMMUNITY): Payer: BLUE CROSS/BLUE SHIELD | Admitting: Licensed Clinical Social Worker

## 2019-04-25 ENCOUNTER — Encounter (HOSPITAL_COMMUNITY): Payer: Self-pay | Admitting: Family

## 2019-04-25 ENCOUNTER — Other Ambulatory Visit: Payer: Self-pay

## 2019-04-25 DIAGNOSIS — F603 Borderline personality disorder: Secondary | ICD-10-CM | POA: Diagnosis not present

## 2019-04-25 DIAGNOSIS — Z915 Personal history of self-harm: Secondary | ICD-10-CM | POA: Diagnosis not present

## 2019-04-25 DIAGNOSIS — F314 Bipolar disorder, current episode depressed, severe, without psychotic features: Secondary | ICD-10-CM | POA: Diagnosis not present

## 2019-04-25 DIAGNOSIS — Z79899 Other long term (current) drug therapy: Secondary | ICD-10-CM | POA: Diagnosis not present

## 2019-04-25 DIAGNOSIS — Z818 Family history of other mental and behavioral disorders: Secondary | ICD-10-CM | POA: Diagnosis not present

## 2019-04-25 NOTE — Progress Notes (Signed)
  [facility] Behavioral Health Partial Hospitalization  Outpatient Program Discharge Summary  Shelby Gallegos [id number]  Admission date: 04/09/2019 Discharge date: 04/27/2019  Reason for admission: Suicidal attempt  Per assessment note: Patient is a 38 y.o. Caucasian female presents after inpatient hospitalization  worsening depression and suicidal attempt . Per assessment note on admission to Inpatient hosptilzation28 year old female. Attempted suicide 3/19 by overdosing on prescribed Alprazolam And on OTC sleeping medication ( Nyquil) . States she took " a whole bottle of Xanax). Following overdose she also cut herself on both forearms resulting in significant blood loss,needing multiple staples. States a friend contacted 911 and was brought to ED. States suicide attempt had been beenformed/planned earlier that day and that at the time " I felt really at peace with it". She cannot identify any specific triggers that may have exacerbated her depression or suicidal ideations. 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   Family of Origin Issues: Continues to report that her family is supportive during this time.  Stated minimal experience with mental illness.  Patient reports she is currently residing with her parents since her suicidal attempt.  Reports supportive friends and family.   Progress in Program Toward Treatment Goals: Ongoing, Shelby Gallegos attended and participated with daily group session with active and engaged participation.  Reports slight apprehension with transitioning from partial hospitalization program to intensive outpatient programming.  However patient was offered encouragement and reassurance.  Continues to report that she is working with her psychiatrist to find an alternate mood  stabilization medication due to cost.   Progress (rationale):  Stepping down to Intensive Outpatient programing  (IOP)   Take all medications as prescribed. Keep all follow-up appointments as scheduled.  Do not consume alcohol or use illegal drugs while on prescription medications. Report any adverse effects from your medications to your primary care provider promptly.  In the event of recurrent symptoms or worsening symptoms, call 911, a crisis hotline, or go to the nearest emergency department for evaluation.     Shelby Rack, NP 04/25/2019

## 2019-04-25 NOTE — Psych (Signed)
Virtual Visit via Video Note  I connected with Shelby Gallegos on 04/16/19 at  9:00 AM EDT by a video enabled telemedicine application and verified that I am speaking with the correct person using two identifiers.   I discussed the limitations of evaluation and management by telemedicine and the availability of in person appointments. The patient expressed understanding and agreed to proceed.  I discussed the assessment and treatment plan with the patient. The patient was provided an opportunity to ask questions and all were answered. The patient agreed with the plan and demonstrated an understanding of the instructions.   The patient was advised to call back or seek an in-person evaluation if the symptoms worsen or if the condition fails to improve as anticipated.  Pt was provided 240 minutes of non-face-to-face time during this encounter.   [name], LCSW    [facility] PHP THERAPIST PROGRESS NOTE  Shelby Gallegos [id number]  Session Time: 9:00 - 10:00  Participation Level: Active  Behavioral Response: CasualAlertDepressed  Type of Therapy: Group Therapy  Treatment Goals addressed: Coping  Interventions: CBT, DBT, Solution Focused, Supportive and Reframing  Summary: Clinician led check-in regarding current stressors and situation, and review of patient completed daily inventory. Clinician utilized active listening and empathetic response and validated patient emotions. Clinician facilitated processing group on pertinent issues.   Therapist Response: Shelby Gallegos is a 38 y.o. female who presents with depression and personality symotoms. Patient arrived within time allowed and reports that she is feeling "pretty low." Patient rates her mood at a 6 on a scale of 1-10 with 10 being great. Pt reports her weekend had ups and downs. Pt states she "kept getting in my head" and ruminated on being a burden and feeling without purpose. Pt states she egan wondering if it was more expensive to  pay for a funeral or her treatment and did the math. Pt states a funeral would have been less expensive. Pt shares she had passive SI over the weekend and denies plan or intent. Pt responded well to challenge that the math she did did not account for added value over time and that if it had the funeral would not have made more financial sense. Pt able to process. Pt engaged in discussion.        Session Time: 10:00 -11:00  Participation Level: Active  Behavioral Response: CasualAlertDepressed  Type of Therapy: Group Therapy, psychoeducation, psychotherapy  Treatment Goals addressed: Coping  Interventions: CBT, DBT, Solution Focused, Supportive and Reframing  Summary: Cln led discussion on judgment and the ways in which it can spiral and hinder progress. Cln suggested pt's utilize "and" instead of "but" as a way to increase awareness that feelings can be valid even if they are problematic or end up being unhelpful. Group discussed ways of applying this concept and how judgement may have been affecting them.  Therapist Response: Pt states using "and" is "really great for me." Pt shares she struggles with judging herself and her feelings. Pt states she will try to reframe invalidating statements with "and."        Session Time: 11:00 -12:00  Participation Level: Active  Behavioral Response: CasualAlertDepressed  Type of Therapy: Group Therapy, psychoeducation, psychotherapy  Treatment Goals addressed: Coping  Interventions: CBT, DBT, Solution Focused, Supportive and Reframing  Summary: Cln continued topic of cognitive distortions. Cln led review of topic from Friday and continued to review cognitive distortion handout. Group identified real life examples of distortions discussed.    Therapist Response: Pt reports  understanding of distortions. Pt identified real life examples for double standard, black and white thinking, and self centered thinking.         Session Time: 12:00- 1:00  Participation Level: Active  Behavioral Response: CasualAlertDepressed  Type of Therapy: Group Therapy, Psychoeducation; Psychotherapy  Treatment Goals addressed: Coping  Interventions: CBT; Solution focused; Supportive; Reframing  Summary: 12:00 - 12:50 Cln introduced second part of "catch, challenge, change" and group began to explore ways to "challenge" distortions once they are identified. Cln utilized Administrator, Civil Servicehandout "Socratic Questions" as ways to bring logic back into distorted thinking.  12:50 -1:00 Clinician led check-out. Clinician assessed for immediate needs, medication compliance and efficacy, and safety concerns   Therapist Response: Pt reports understanding of how to challenge distorted thinking and demonstrates this by successfully challenging examples discussed.  At checkout, pt rates her mood at [facility]on a scale of 1-10 with 10 being great. Patient reports afternoon plans of "just trying to keep busy." Pt demonstrates some progress as evidenced by allowing herself to reframe negative thinking when she didn't want to.Patient denies SI/HI/self-harm at the end of group.      Suicidal/Homicidal: Nowithout intent/plan   Plan: Pt will continue in PHP while working to decrease depression symptoms, decrease SI, and increase ability to manage symptoms in a healthy manner as they arise.   Diagnosis: Bipolar affective disorder, depressed, severe (HCC) [F31.4]    1. Bipolar affective disorder, depressed, severe (HCC)   2. Borderline personality disorder (HCC)       Donia GuilesJenny Hernandez [place], LCSW 04/25/2019

## 2019-04-26 ENCOUNTER — Other Ambulatory Visit (HOSPITAL_COMMUNITY): Payer: BLUE CROSS/BLUE SHIELD | Admitting: Licensed Clinical Social Worker

## 2019-04-26 ENCOUNTER — Encounter (HOSPITAL_COMMUNITY): Payer: Self-pay

## 2019-04-26 ENCOUNTER — Encounter (HOSPITAL_COMMUNITY): Payer: Self-pay | Admitting: Occupational Therapy

## 2019-04-26 ENCOUNTER — Other Ambulatory Visit: Payer: Self-pay

## 2019-04-26 ENCOUNTER — Other Ambulatory Visit (HOSPITAL_COMMUNITY): Payer: BLUE CROSS/BLUE SHIELD | Admitting: Occupational Therapy

## 2019-04-26 DIAGNOSIS — F314 Bipolar disorder, current episode depressed, severe, without psychotic features: Secondary | ICD-10-CM | POA: Diagnosis not present

## 2019-04-26 DIAGNOSIS — F603 Borderline personality disorder: Secondary | ICD-10-CM

## 2019-04-26 DIAGNOSIS — S66922D Laceration of unspecified muscle, fascia and tendon at wrist and hand level, left hand, subsequent encounter: Secondary | ICD-10-CM | POA: Diagnosis not present

## 2019-04-26 DIAGNOSIS — S66921D Laceration of unspecified muscle, fascia and tendon at wrist and hand level, right hand, subsequent encounter: Secondary | ICD-10-CM | POA: Diagnosis not present

## 2019-04-26 DIAGNOSIS — M25532 Pain in left wrist: Secondary | ICD-10-CM | POA: Diagnosis not present

## 2019-04-26 DIAGNOSIS — Z79899 Other long term (current) drug therapy: Secondary | ICD-10-CM | POA: Diagnosis not present

## 2019-04-26 DIAGNOSIS — R4589 Other symptoms and signs involving emotional state: Secondary | ICD-10-CM

## 2019-04-26 DIAGNOSIS — M25631 Stiffness of right wrist, not elsewhere classified: Secondary | ICD-10-CM | POA: Diagnosis not present

## 2019-04-26 DIAGNOSIS — Z915 Personal history of self-harm: Secondary | ICD-10-CM | POA: Diagnosis not present

## 2019-04-26 DIAGNOSIS — Z818 Family history of other mental and behavioral disorders: Secondary | ICD-10-CM | POA: Diagnosis not present

## 2019-04-26 NOTE — Progress Notes (Signed)
Met with patient through virtual WebEx session as she presented with flat affect, depressed mood and acknowledged she still has suicidal thoughts at times.  Patient denied any at present and denied any plans or intent to want to harm self or others.  Denies any auditory or visual hallucinations and stated at times, such as the previous night, she just had a hard time "letting things go" with her thoughts when they turn negative.  Patient reported doing better today even though she was unable to sleep the previous night.  States her energy level is a little low but admits she has been sleeping better each night overall, at least 5 hours a night usually. Reviewed all medications with patient and when she takes them as she reports plan to possibly try 4 mg of Prazosin on nights she has difficulty sleeping as her prescription allows.  Patient rated her current level of depression a 5, anxiety an 8, and hopefulness a 9 on a scale of 0-10 with 0 being none and no hope and 10 being the worst she could manage but hopeful.  Patient stated she does feel more hopeful overall and reports she tries skills she has learned when her mind is ruminating more to help her relax.  Reports at times this helps but no always and stated "I know they will not give me any Xanax" but at times feels she may need something to help with periods of increased anxiousness.  Patient reported she does not remember ever trying Hydroxyzine, Propranolol, BuSpar or other such medications that have been used to help with anxiety and that do not work like a benzodiazepines.  States she would be interested in discussing these as options if anxiety issues continue with NP but denies as current need.  Patient reported no plans, desire, or intent to want to harm self or others at this time and states she does feel PHP has been helpful with managing emotions and thoughts.  Patient agreed to inform this nurse or PHP staff if any worsening of thoughts or problems  with medications or if began to have thoughts of planning or wanting to act on periodic suicidal ideations that she admits just come up from time-to-time.  States she knows these are fleeting and has no intention to act on them when they occur.  If patient's anxiety continues she agrees to discuss more with her psychiatrist or Ricky Ala, NP.

## 2019-04-26 NOTE — Therapy (Signed)
[facility] PARTIAL HOSPITALIZATION PROGRAM [address] [place], [place], [address] Phone: [phone]   Fax:  [phone]  Occupational Therapy Treatment  Patient Details  Name: Shelby Gallegos MRN: [id number] Date of Birth: Jan 17, 1981 Referring Provider (OT): Hillery Jacks, NP  Virtual Visit via Video Note  I connected with Shelby Gallegos on 04/26/19 at  8:00 AM EDT by a video enabled telemedicine application and verified that I am speaking with the correct person using two identifiers.   I discussed the limitations of evaluation and management by telemedicine and the availability of in person appointments. The patient expressed understanding and agreed to proceed.   I discussed the assessment and treatment plan with the patient. The patient was provided an opportunity to ask questions and all were answered. The patient agreed with the plan and demonstrated an understanding of the instructions.   The patient was advised to call back or seek an in-person evaluation if the symptoms worsen or if the condition fails to improve as anticipated.  I provided 60 minutes of non-face-to-face time during this encounter.  Dalphine Handing, MSOT, OTR/L Behavioral Health OT/ Acute Relief OT PHP Office: [phone]  Dalphine Handing, [place]    Encounter Date: 04/26/2019  OT End of Session - 04/26/19 1357    Visit Number  8    Number of Visits  12    Date for OT Re-Evaluation  05/08/19    Authorization Type  [employer]    OT Start Time  1100    OT Stop Time  1200    OT Time Calculation (min)  60 min    Activity Tolerance  Patient tolerated treatment well    Behavior During Therapy  [facility] for tasks assessed/performed       Past Medical History:  Diagnosis Date  . ADHD (attention deficit hyperactivity disorder)   . Bipolar 1 disorder (HCC)   . Migraine   . Suicide attempt by drug overdose (HCC) 02/15/2019    Past Surgical History:  Procedure Laterality Date  . NO PAST  SURGERIES    . TENDON REPAIR Bilateral 02/20/2019   Procedure: TENDON REPAIR BILATERAL FOREARMS;  Surgeon: [name], MD;  Location: [facility];  Service: Orthopedics;  Laterality: Bilateral;  . WOUND EXPLORATION Bilateral 02/20/2019   Procedure: WOUND EXPLORATION;  Surgeon: [name], MD;  Location: [facility];  Service: Orthopedics;  Laterality: Bilateral;    There were no vitals filed for this visit.  Subjective Assessment - 04/26/19 1357    Currently in Pain?  No/denies        S: "I have had a hard time getting back into a routine since the hospital"   O: Pt educated on sleep hygiene as it pertains to daily life/routines this date. Education given on appropriate sleep routines, sleep disorders, detriments of too much/too little sleep with encouraged feedback of personal Experiences. Sleep hygiene handout given for pt to choose new areas for implementation in BADL routine. Further education given on relaxation techniques to implement before bed. Pt asked to identify one STG in relation to sleep hygiene to create better daily sleep habits.   A: Pt presents with blunted affect, engaged and participatory throughout session. Pt in understanding of sleep hygiene skills discussed. Pt shares in the hospital she had gotten used a sleep schedule, but since being home had struggled adapting. She has committed do decreasing her caffeine intake in the afternoons and implementing the "20 minute rule" skill of getting out of  bed after 20 minutes of not being able to fall asleep.  P: OT group will be x3 per week while pt in PHP                OT Education - 04/26/19 1357    Education Details  education given on sleep hygiene    Person(s) Educated  Patient    Methods  Explanation;Handout    Comprehension  Verbalized understanding       OT Short Term Goals - 04/12/19 1453      OT SHORT TERM GOAL #1   Title  Pt will be educated on strategies to  improve psychosocial skills needed to participate fully in all daily, work, and leisure activities    Time  4    Period  Weeks    Status  On-going    Target Date  05/08/19      OT SHORT TERM GOAL #2   Title  Pt will apply psychosocial skills and coping mechanisms to daily activities in order to function independently and reintegrate into community    Time  4    Period  Weeks    Status  On-going    Target Date  05/08/19      OT SHORT TERM GOAL #3   Title  Pt will recall and/or apply 1-3 sleep hygiene strategies to improve BADL participation prior to reintegrating into community    Time  4    Period  Weeks    Status  On-going    Target Date  05/08/19      OT SHORT TERM GOAL #4   Title  Pt will engage in goal setting to improve BADL/IADL participation prior to reintegrating into community    Time  4    Period  Weeks    Status  On-going    Target Date  05/08/19      OT SHORT TERM GOAL #5   Title  Pt will create and/or implement functional BADL/IADL routine prior to reintegrating into community    Time  4    Period  Weeks    Status  On-going    Target Date  05/08/19               Plan - 04/26/19 1358    Occupational performance deficits (Please refer to evaluation for details):  ADL's;IADL's;Rest and Sleep;Work;Leisure;Social Participation    Body Structure / Function / Physical Skills  ADL    Psychosocial Skills  Habits       Patient will benefit from skilled therapeutic intervention in order to improve the following deficits and impairments:  Body Structure / Function / Physical Skills, Psychosocial Skills  Visit Diagnosis: Bipolar affective disorder, depressed, severe (HCC)  Borderline personality disorder (HCC)  Difficulty coping    Problem List Patient Active Problem List   Diagnosis Date Noted  . Toxic encephalopathy 02/16/2019  . Suicide attempt (HCC)   . Overdose 02/15/2019  . Suicidal behavior with attempted self-injury (HCC) 02/15/2019  . AKI  (acute kidney injury) (HCC) 02/15/2019  . Leukocytosis 02/15/2019  . Laceration of multiple sites of skin 02/15/2019  . Intentional acetaminophen overdose (HCC)   . History of suicide attempt 04/01/2017  . Borderline personality disorder (HCC) 02/16/2017  . Bipolar affective disorder, depressed, severe (HCC) 08/21/2015  . ADHD (attention deficit hyperactivity disorder) 08/21/2015  . Headache, migraine 09/28/2014  . Abnormal ECG 11/26/2009  . Essential (primary) hypertension 11/26/2009   Dalphine Handing, MSOT, OTR/L Behavioral Health OT/ Acute Relief OT  PHP Office: [phone]  Dalphine HandingKaylee Kash [place] 04/26/2019, 1:58 PM  [facility] [address] [place], [place], [address] Phone: [phone]   Fax:  [phone]  Name: Shelby MuirKelli Darco MRN: [id number] Date of Birth: 09-10-1981

## 2019-04-27 ENCOUNTER — Other Ambulatory Visit (HOSPITAL_COMMUNITY): Payer: BLUE CROSS/BLUE SHIELD | Admitting: Licensed Clinical Social Worker

## 2019-04-27 ENCOUNTER — Other Ambulatory Visit (HOSPITAL_COMMUNITY): Payer: BLUE CROSS/BLUE SHIELD | Admitting: Occupational Therapy

## 2019-04-27 ENCOUNTER — Other Ambulatory Visit: Payer: Self-pay

## 2019-04-27 ENCOUNTER — Encounter (HOSPITAL_COMMUNITY): Payer: Self-pay | Admitting: Occupational Therapy

## 2019-04-27 DIAGNOSIS — Z915 Personal history of self-harm: Secondary | ICD-10-CM | POA: Diagnosis not present

## 2019-04-27 DIAGNOSIS — F314 Bipolar disorder, current episode depressed, severe, without psychotic features: Secondary | ICD-10-CM

## 2019-04-27 DIAGNOSIS — F603 Borderline personality disorder: Secondary | ICD-10-CM

## 2019-04-27 DIAGNOSIS — R4589 Other symptoms and signs involving emotional state: Secondary | ICD-10-CM

## 2019-04-27 DIAGNOSIS — Z79899 Other long term (current) drug therapy: Secondary | ICD-10-CM | POA: Diagnosis not present

## 2019-04-27 DIAGNOSIS — Z818 Family history of other mental and behavioral disorders: Secondary | ICD-10-CM | POA: Diagnosis not present

## 2019-04-27 NOTE — Therapy (Signed)
[place], [place], [address] Phone: [phone]   Fax:  [phone]  Occupational Therapy Treatment  Patient Details  Name: Shelby Gallegos MRN: [id number] Date of Birth: Apr 13, 1981 Referring Provider (OT): Ricky Ala, NP  Virtual Visit via Video Note  I connected with Shelby Gallegos on 04/27/19 at  8:00 AM EDT by a video enabled telemedicine application and verified that I am speaking with the correct person using two identifiers.   I discussed the limitations of evaluation and management by telemedicine and the availability of in person appointments. The patient expressed understanding and agreed to proceed.  I discussed the assessment and treatment plan with the patient. The patient was provided an opportunity to ask questions and all were answered. The patient agreed with the plan and demonstrated an understanding of the instructions.   The patient was advised to call back or seek an in-person evaluation if the symptoms worsen or if the condition fails to improve as anticipated.  I provided 60 minutes of non-face-to-face time during this encounter.  Zenovia Jarred, MSOT, OTR/L Behavioral Health OT/ Acute Relief OT PHP Office: [phone]  Zenovia Jarred, [place]    Encounter Date: 04/27/2019  OT End of Session - 04/27/19 1952    Visit Number  9    Number of Visits  12    Date for OT Re-Evaluation  05/08/19    Authorization Type  BCBS    OT Start Time  1100    OT Stop Time  1200    OT Time Calculation (min)  60 min    Activity Tolerance  Patient tolerated treatment well    Behavior During Therapy  [facility] for tasks assessed/performed       Past Medical History:  Diagnosis Date  . ADHD (attention deficit hyperactivity disorder)   . Bipolar 1 disorder ([place])   . Migraine   . Suicide attempt by drug overdose ([place]) 02/15/2019    Past Surgical History:  Procedure Laterality Date  . NO PAST  SURGERIES    . TENDON REPAIR Bilateral 02/20/2019   Procedure: TENDON REPAIR BILATERAL FOREARMS;  Surgeon: Leanora Cover, MD;  Location: [place];  Service: Orthopedics;  Laterality: Bilateral;  . WOUND EXPLORATION Bilateral 02/20/2019   Procedure: WOUND EXPLORATION;  Surgeon: Leanora Cover, MD;  Location: [place];  Service: Orthopedics;  Laterality: Bilateral;    There were no vitals filed for this visit.  Subjective Assessment - 04/27/19 1951    Currently in Pain?  No/denies         S: "I am more of a passive person"  O: Education given on assertive conversation, situations, body language, and appropriate context for skill.?Education and worksheet given to identify three definitions (assertive, passive, and aggressive)?with scenarios to identify assertive, passive, and aggressive with verbal and written examples. Education given on assertiveness techniques and effective assertiveness communication to apply to daily life when reintegrating into community. Pt asked to identify one area in which they would like to be more assertive.   A: Pt presents with flat/tearful affect, engaged and participatory throughout session. Pt tearful due to this being last day in PHP. She shares how she is generally passive and has been for most of her life. She shares she is this way in family and friendships. She would like to improve her assertiveness within her family dynamic.  P: OT group will be x3 per week while pt in PHP.  OT Education - 04/27/19 1951    Education Details  education given on assertiveness communication strategies    Person(s) Educated  Patient    Methods  Explanation;Handout    Comprehension  Verbalized understanding       OT Short Term Goals - 04/27/19 1953      OT SHORT TERM GOAL #1   Title  Pt will be educated on strategies to improve psychosocial skills needed to participate fully in all daily, work, and leisure  activities    Time  4    Period  Weeks    Status  Achieved    Target Date  05/08/19      OT SHORT TERM GOAL #2   Title  Pt will apply psychosocial skills and coping mechanisms to daily activities in order to function independently and reintegrate into community    Time  4    Period  Weeks    Status  Achieved    Target Date  05/08/19      OT SHORT TERM GOAL #3   Title  Pt will recall and/or apply 1-3 sleep hygiene strategies to improve BADL participation prior to reintegrating into community    Time  4    Period  Weeks    Status  Achieved    Target Date  05/08/19      OT SHORT TERM GOAL #4   Title  Pt will engage in goal setting to improve BADL/IADL participation prior to reintegrating into community    Time  4    Period  Weeks    Status  Achieved    Target Date  05/08/19      OT SHORT TERM GOAL #5   Title  Pt will create and/or implement functional BADL/IADL routine prior to reintegrating into community    Time  4    Period  Weeks    Status  Achieved    Target Date  05/08/19               Plan - 04/27/19 1952    Occupational performance deficits (Please refer to evaluation for details):  ADL's;IADL's;Rest and Sleep;Work;Leisure;Social Participation    Body Structure / Function / Physical Skills  ADL    Psychosocial Skills  Habits       Patient will benefit from skilled therapeutic intervention in order to improve the following deficits and impairments:  Body Structure / Function / Physical Skills, Psychosocial Skills  Visit Diagnosis: Bipolar affective disorder, depressed, severe ([place])  Borderline personality disorder ([place])  Difficulty coping    Problem List Patient Active Problem List   Diagnosis Date Noted  . Toxic encephalopathy 02/16/2019  . Suicide attempt ([place])   . Overdose 02/15/2019  . Suicidal behavior with attempted self-injury ([place]) 02/15/2019  . AKI (acute kidney injury) ([place]) 02/15/2019  . Leukocytosis 02/15/2019  . Laceration of  multiple sites of skin 02/15/2019  . Intentional acetaminophen overdose ([place])   . History of suicide attempt 04/01/2017  . Borderline personality disorder ([place]) 02/16/2017  . Bipolar affective disorder, depressed, severe ([place]) 08/21/2015  . ADHD (attention deficit hyperactivity disorder) 08/21/2015  . Headache, migraine 09/28/2014  . Abnormal ECG 11/26/2009  . Essential (primary) hypertension 11/26/2009    OCCUPATIONAL THERAPY DISCHARGE SUMMARY  Visits from Start of Care: 9  Current functional level related to goals / functional outcomes: Stepping down to IOP level of care   Remaining deficits: Continuing to implement coping skills   Education / Equipment: Education given on psychosocial skills  and coping mechanisms as it relates to BADL/IADL function and routine. Plan: Patient agrees to discharge.  Patient goals were met. Patient is being discharged due to meeting the stated rehab goals.  ?????        Zenovia Jarred, MSOT, OTR/L Behavioral Health OT/ Acute Relief OT PHP Office: [place] 04/27/2019, 7:55 PM  [facility] HOSPITALIZATION PROGRAM [place], [place], [address] Phone: [phone]   Fax:  [phone]  Name: Shelby Gallegos MRN: [id number] Date of Birth: 03/25/1981

## 2019-04-27 NOTE — Progress Notes (Signed)
GROUP NOTE - spiritual care group 04/25/2019 10:00 - 11:00 ?Facilitated by Wilkie Aye, MDiv, BCC.  ? ? Group focused on topic of strength. ?Group members reflected on what thoughts and feelings emerge when they hear this topic. ?They then engaged in facilitated dialog around how strength is present in their lives. This dialog focused on representing what strength had been to them in their lives (images and patterns given) and what they saw as helpful in their life now (what they needed / wanted). ? ? Activity drew on narrative framework    Shelby Gallegos was present throughout group.  Engaged in discussion and activity

## 2019-04-30 ENCOUNTER — Encounter (HOSPITAL_COMMUNITY): Payer: Self-pay | Admitting: Psychiatry

## 2019-04-30 ENCOUNTER — Other Ambulatory Visit: Payer: Self-pay

## 2019-04-30 ENCOUNTER — Other Ambulatory Visit (HOSPITAL_COMMUNITY): Payer: BLUE CROSS/BLUE SHIELD | Attending: Psychiatry | Admitting: Psychiatry

## 2019-04-30 DIAGNOSIS — F603 Borderline personality disorder: Secondary | ICD-10-CM

## 2019-04-30 DIAGNOSIS — F314 Bipolar disorder, current episode depressed, severe, without psychotic features: Secondary | ICD-10-CM | POA: Diagnosis not present

## 2019-04-30 DIAGNOSIS — F909 Attention-deficit hyperactivity disorder, unspecified type: Secondary | ICD-10-CM | POA: Insufficient documentation

## 2019-04-30 DIAGNOSIS — F419 Anxiety disorder, unspecified: Secondary | ICD-10-CM | POA: Insufficient documentation

## 2019-04-30 DIAGNOSIS — Z79899 Other long term (current) drug therapy: Secondary | ICD-10-CM | POA: Diagnosis not present

## 2019-04-30 DIAGNOSIS — R45851 Suicidal ideations: Secondary | ICD-10-CM | POA: Diagnosis not present

## 2019-04-30 NOTE — Progress Notes (Signed)
Virtual Visit via Video Note  I connected with Shelby Gallegos on 04/30/19 at 0800 by a video enabled telemedicine application and verified that I am speaking with the correct person using two identifiers.  I discussed the limitations of evaluation and management by telemedicine and the availability of in person appointments. The patient expressed understanding and agreed to proceed.  I discussed the assessment and treatment plan with the patient. The patient was provided an opportunity to ask questions and all were answered. The patient agreed with the plan and demonstrated an understanding of the instructions.  The patient was advised to call back or seek an in-person evaluation if the symptoms worsen or if the condition fails to improve as anticipated.  I provided 20 minutes of non-face-to-face time during this encounter.   This is a 38 yr old, single, employed, Caucasian female, who [facility].  As per previous [facility] note states:   Pt reports to PHP per inpt f/u 1.5 months ago. Pt was inpt for suicide attempt by overdose and cutting. Pt reports attempt was impulsive; "I woke up that morning and though 'today is the day I'm going to die.'" Pt reports she does not have intent/plan at this time. Denies any prior suicide attempts/gestures.  Pt states she questions why her attempt was unsuccessful. Pt is staying with parents for support and safety. Pt reports two prior psychiatric hospitalizations:  [facility] (2018) and [address] [phone]).  Pt reports seeing Dr. Evelene Croon (since 2000) and Mila Palmer, [facility] (since 2016) on an outpt basis. Pt reports continued struggles with depression, anxiety, isolation, and racing thoughts. Pt reports hx of self-harm by punching walls until knuckles bled.  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 Pt denies HI/AVH. Pt is well known to this Clinical research associate d/t previous MH-IOP admits.  CC: prior notes for hx. Discussed  aftercare plans with patient.  Pt states she will be meeting with the manager at [facility] before returning there to discuss groups or volunteer options.  Although pt denies SI today; discussed safety options with pt at length.  Pt denies HI or A/V hallucinations.  A:  Re-oriented pt to virtual MH-IOP.  Answered all questions.  Pt gave verbal consent for tx, to release chart information to referred providers and to complete any forms if needed.  Pt also gave consent for attending group virtually d/t COVID-19 social distancing restrictions.  Encouraged online support groups thru [facility].  F/U with Dr. Evelene Croon and Mila Palmer, [facility].  R:  Pt receptive.             Chestine Spore, RITA, M.Ed,CNA

## 2019-04-30 NOTE — Progress Notes (Signed)
Virtual Visit via Video Note  I connected with Shelby Gallegos on 04/30/19 at  9:00 AM EDT by a video enabled telemedicine application and verified that I am speaking with the correct person using two identifiers.  Location: Patient: Shelby Gallegos Provider: Hilbert Odor, LCSW   I discussed the limitations of evaluation and management by telemedicine and the availability of in person appointments. The patient expressed understanding and agreed to proceed.  History of Present Illness: Bipolar affective disorder, depressed, severe and  Borderline Personality disorder due to adverse life experiences.    Observations/Objective: Counselor checked in with all participants and introduced [place] as a new participant. Shelby Gallegos reported that she was feeling unsure and anxious today, but that she was glad to be there and would push through being uncomfortable. Counselor reassured her and validated feelings. Counselor reviewed learned coping skills during treatment, prompting participants to share their favorites and most helpful. Shelby Gallegos was able to identify several and how she implements them, especially those that allow her to express herself creatively. Counselor then shared a video on 25 additional coping skills, prompting the group to jot them down and share which they are interested in trying next time they are feeling overwhelmed with thoughts or feelings. Shelby Gallegos shared her preferences and plan to use them. Counselor then engaged participants in and ECOMAP activity, prompting them to create their own, then share about current support systems/dynamics. Shelby Gallegos identified some supports and that she has a need to focus on her mental health, self and housing. Counselor praised all for their participation in group and prompted each to share their self-care or productivity activity for the day. Shelby Gallegos planned to do self-care through resting and engaging in entertainment at home.    Assessment and Plan: Shelby Gallegos participated  well in group and was appropriate for level of services.  Counselor recommends that [facility] remain in IOP Group to continue work on treatment plan goals to better manage mental health symptoms.   Follow Up Instructions: Counselor will send Webex link for the next session.    I discussed the assessment and treatment plan with the patient. The patient was provided an opportunity to ask questions and all were answered. The patient agreed with the plan and demonstrated an understanding of the instructions.   The patient was advised to call back or seek an in-person evaluation if the symptoms worsen or if the condition fails to improve as anticipated.  I provided 150 minutes of non-face-to-face time during this encounter.   Hilbert Odor, LCSW

## 2019-05-01 ENCOUNTER — Encounter (HOSPITAL_COMMUNITY): Payer: Self-pay | Admitting: Family

## 2019-05-01 ENCOUNTER — Other Ambulatory Visit: Payer: Self-pay

## 2019-05-01 ENCOUNTER — Other Ambulatory Visit (HOSPITAL_COMMUNITY): Payer: BLUE CROSS/BLUE SHIELD | Admitting: Licensed Clinical Social Worker

## 2019-05-01 DIAGNOSIS — F314 Bipolar disorder, current episode depressed, severe, without psychotic features: Secondary | ICD-10-CM | POA: Diagnosis not present

## 2019-05-01 DIAGNOSIS — F419 Anxiety disorder, unspecified: Secondary | ICD-10-CM | POA: Diagnosis not present

## 2019-05-01 DIAGNOSIS — F603 Borderline personality disorder: Secondary | ICD-10-CM

## 2019-05-01 DIAGNOSIS — Z79899 Other long term (current) drug therapy: Secondary | ICD-10-CM | POA: Diagnosis not present

## 2019-05-01 DIAGNOSIS — R45851 Suicidal ideations: Secondary | ICD-10-CM | POA: Diagnosis not present

## 2019-05-01 DIAGNOSIS — F909 Attention-deficit hyperactivity disorder, unspecified type: Secondary | ICD-10-CM | POA: Diagnosis not present

## 2019-05-01 NOTE — Progress Notes (Signed)
Virtual Visit via Video Note  I connected with Shelby Gallegos on 05/01/19 at  9:00 AM EDT by a video enabled telemedicine application and verified that I am speaking with the correct person using two identifiers.   I discussed the limitations of evaluation and management by telemedicine and the availability of in person appointments. The patient expressed understanding and agreed to proceed.   I discussed the assessment and treatment plan with the patient. The patient was provided an opportunity to ask questions and all were answered. The patient agreed with the plan and demonstrated an understanding of the instructions.   The patient was advised to call back or seek an in-person evaluation if the symptoms worsen or if the condition fails to improve as anticipated.  I provided 15 minutes of non-face-to-face time during this encounter.   Oneta Rackanika N Hon[place] Name, NP    Psychiatric Initial Adult Assessment   Patient Identification: Shelby Gallegos MRN:  [id number] Date of Evaluation:  05/01/2019 Referral Source: Transition from partial hospitalization program Chief Complaint:  Worsening depression and anxiety with previous suicide attempt Visit Diagnosis: No diagnosis found.  History of Present Illness: Per assessment note:Shelby Gallegos is a 38 y.o. Caucasian female presents after inpatient hospitalization  worsening depression and suicidal attempt . Per assessment note on admission to Inpatient hosptilzation[age] year old female. Attempted suicide 3/19 by overdosing on prescribed Alprazolam And on OTC sleeping medication ( Nyquil) . States she took " a whole bottle of Xanax). Following overdose she also cut herself on both forearms resulting in significant blood loss,needing multiple staples. States a friend contacted 911 and was brought to ED. States suicide attempt had been beenformed/planned earlier that day and that at the time " I felt really at peace with it". She cannot identify any specific triggers  that may have exacerbated her depression or suicidal ideations. 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  Evaluation: 05/01/2019 patient recently completed partial hospitalization programming.  She is transitioning to intensive outpatient programming (IOP).  Shelby Gallegos has chronic suicidal ideations however is currently denying intent or plan.  Reports productive weekend.  As she stated she was around family and friends celebrating birthdays.  She denies auditory or visual hallucinations.  Reports a good appetite.  States she is resting well throughout the night.  Patient to continue medications as directed by attending psychiatrist.  Shelby Gallegos denied any concerns with medication management during this assessment.  Support encouragement and reassurance was provided.  Associated Signs/Symptoms: Depression Symptoms:  depressed mood, feelings of worthlessness/guilt, difficulty concentrating, (Hypo) Manic Symptoms:  Distractibility, Impulsivity, Anxiety Symptoms:  Excessive Worry, Psychotic Symptoms:  Hallucinations: None PTSD Symptoms: NA  Past Psychiatric History: See chart  Previous Psychotropic Medications: No   Substance Abuse History in the last 12 months:  No.  Consequences of Substance Abuse: NA  Past Medical History:  Past Medical History:  Diagnosis Date  . ADHD (attention deficit hyperactivity disorder)   . Bipolar 1 disorder ([facility])   . Migraine   . Suicide attempt by drug overdose ([facility]) 02/15/2019    Past Surgical History:  Procedure Laterality Date  . NO PAST SURGERIES    . TENDON REPAIR Bilateral 02/20/2019   Procedure: TENDON REPAIR BILATERAL FOREARMS;  Surgeon: Betha LoaKuzma, Kevin, MD;  Location: [facility];  Service: Orthopedics;  Laterality: Bilateral;  . WOUND  EXPLORATION Bilateral 02/20/2019   Procedure: WOUND EXPLORATION;  Surgeon: [name], MD;  Location: [facility];  Service: Orthopedics;  Laterality: Bilateral;    Family Psychiatric History:   Family History:  Family History  Problem Relation Age of Onset  . Atrial fibrillation Mother   . Bipolar disorder Paternal Uncle   . Alcohol abuse Paternal Uncle   . Depression Paternal Uncle   . OCD Paternal Uncle     Social History:   Social History   Socioeconomic History  . Marital status: Single    Spouse name: Not on file  . Number of children: Not on file  . Years of education: Not on file  . Highest education level: Not on file  Occupational History  . Not on file  Social Needs  . Financial resource strain: Not very hard  . Food insecurity:    Worry: Never true    Inability: Never true  . Transportation needs:    Medical: No    Non-medical: No  Tobacco Use  . Smoking status: Never Smoker  . Smokeless tobacco: Never Used  Substance and Sexual Activity  . Alcohol use: No  . Drug use: No  . Sexual activity: Not Currently  Lifestyle  . Physical activity:    Days per week: 2 days    Minutes per session: 30 min  . Stress: Rather much  Relationships  . Social connections:    Talks on phone: More than three times a week    Gets together: Once a week    Attends religious service: More than 4 times per year    Active member of club or organization: Yes    Attends meetings of clubs or organizations: More than 4 times per year    Relationship status: Never married  Other Topics Concern  . Not on file  Social History Narrative  . Not on file    Additional Social History:   Allergies:  No Known Allergies  Metabolic Disorder Labs: Lab Results  Component Value Date   HGBA1C 5.6 02/19/2019   MPG 114.02 02/19/2019   No results found for: PROLACTIN Lab Results  Component Value Date   CHOL 191 02/19/2019   TRIG 123 02/19/2019   HDL 52 02/19/2019    CHOLHDL 3.7 02/19/2019   VLDL 25 02/19/2019   LDLCALC 114 (H) 02/19/2019   LDLCALC 101 (H) 05/25/2017   Lab Results  Component Value Date   TSH 6.718 (H) 02/18/2019    Therapeutic Level Labs: No results found for: LITHIUM No results found for: CBMZ No results found for: VALPROATE  Current Medications: Current Outpatient Medications  Medication Sig Dispense Refill  . amphetamine-dextroamphetamine (ADDERALL) 20 MG tablet Take 20 mg by mouth 3 (three) times daily.    Dorise Hiss (AIMOVIG, 140 MG DOSE,) 70 MG/ML SOAJ Inject 70 mg into the skin every 30 (thirty) days.    Marland Kitchen gabapentin (NEURONTIN) 400 MG capsule Take 400 mg by mouth 4 (four) times daily.    Marland Kitchen lamoTRIgine (LAMICTAL) 150 MG tablet Take 150 mg by mouth 2 (two) times daily.    . Lurasidone HCl 60 MG TABS Take 1 tablet (60 mg total) by mouth daily with breakfast. 30 tablet 0  . OXcarbazepine (TRILEPTAL) 150 MG tablet Take 1 tablet (150 mg total) by mouth at bedtime. 30 tablet 0  . prazosin (MINIPRESS) 2 MG capsule Take 2-4 mg by mouth at bedtime.    . SUMAtriptan (IMITREX) 6  MG/0.5ML SOLN injection Inject 6 mg into the skin daily as needed.    . topiramate (TOPAMAX) 100 MG tablet      No current facility-administered medications for this visit.     Musculoskeletal: Strength & Muscle Tone: within normal limits Gait & Station: normal Patient leans: N/A  Psychiatric Specialty Exam: Review of Systems  Psychiatric/Behavioral: Positive for depression. Negative for hallucinations and suicidal ideas. The patient does not have insomnia.   All other systems reviewed and are negative.   There were no vitals taken for this visit.There is no height or weight on file to calculate BMI.  General Appearance: NA  Eye Contact:  NA  Speech:  Clear and Coherent  Volume:  Normal  Mood:  Anxious and Depressed  Affect:  NA  Thought Process:  Coherent and Linear  Orientation:  Full (Time, Place, and Person)  Thought Content:   Logical  Suicidal Thoughts:  No passive thoughts however is denying today  Homicidal Thoughts:  No  Memory:  Immediate;   Fair Recent;   Fair  Judgement:  Fair  Insight:  Fair  Psychomotor Activity:  Normal  Concentration:  Concentration: Fair  Recall:  [employer] of Knowledge:Fair  Language: Fair  Akathisia:  No  Handed:  Right  AIMS (if indicated):    Assets:  Communication Skills Desire for Improvement Resilience Social Support  ADL's:  Intact  Cognition: WNL  Sleep:  Fair   Screenings: AIMS     [facility]  AIMS Total Score  0    PHQ2-9     Counselor from 04/10/2019 in [facility]IORAL HEALTH PARTIAL HOSPITALIZATION PROGRAM  PHQ-2 Total Score  4  PHQ-9 Total Score  17      Assessment and Plan:  Admitted to Intensive Outpatient program Continue medications as directed  Treatment plan was reviewed and agreed upon by NP T. Melvyn Neth and patient Shelby Gallegos need for group Gallegos   Oneta Rack, NP 6/2/20207:11 AM

## 2019-05-01 NOTE — Psych (Signed)
Virtual Visit via Video Note  I connected with Shelby Gallegos on 04/17/19 at  9:00 AM EDT by a video enabled telemedicine application and verified that I am speaking with the correct person using two identifiers.   I discussed the limitations of evaluation and management by telemedicine and the availability of in person appointments. The patient expressed understanding and agreed to proceed.  I discussed the assessment and treatment plan with the patient. The patient was provided an opportunity to ask questions and all were answered. The patient agreed with the plan and demonstrated an understanding of the instructions.   The patient was advised to call back or seek an in-person evaluation if the symptoms worsen or if the condition fails to improve as anticipated.  Pt was provided 240 minutes of non-face-to-face time during this encounter.   [name], LCSW    [facility] PHP THERAPIST PROGRESS NOTE  Shelby Gallegos [id number]  Session Time: 9:00 - 10:00  Participation Level: Active  Behavioral Response: CasualAlertDepressed  Type of Therapy: Group Therapy  Treatment Goals addressed: Coping  Interventions: CBT, DBT, Solution Focused, Supportive and Reframing  Summary: Clinician led check-in regarding current stressors and situation, and review of patient completed daily inventory. Clinician utilized active listening and empathetic response and validated patient emotions. Clinician facilitated processing group on pertinent issues.   Therapist Response: Shelby Gallegos is a 38 y.o. female who presents with depression and personality symotoms. Patient arrived within time allowed and reports that she is feeling "pretty low." Patient rates her mood at a 6 on a scale of 1-10 with 10 being great. Pt reports after group she was "not okay" yesterday. Pt shares she went to bed after leaving group and didn't want to see people. Pt shares her niece and nephew came over and she did not come out of her  room. Pt shares frustrations because it has been one of the worst days in awhile. Pt reports passive SI and that it was harder to move past than normal. Pt able to process. Pt engaged in discussion.        Session Time: 10:00 -11:00  Participation Level:Active  Behavioral Response:CasualAlertDepressed  Type of Therapy: Group Therapy, psychoeducation, psychotherapy  Treatment Goals addressed: Coping  Interventions:CBT, DBT, Solution Focused, Supportive and Reframing  Summary:Cln led discussion on integrating and applying coping skills into our lives. Cln provided handouts listing activities and exercises for the purpose of helping pt's "unlock" different things to try for coping. Group shares ways they struggle to apply and talked through situations in which they could have applied skills to reinforce for next time.   Therapist Response: Ptshares she struggles most with using skills when she is in a bad place. Pt states she thought of ways to manage her bad mood yesterday, but didn't "feel like" trying any of them. Pt is able to list skills she can use and states she will try looking back on situations to increase ability to use skills in the moment.       Session Time: 11:00 - 12:00  Participation Level:Active  Behavioral Response:CasualAlertDepressed  Type of Therapy: Group Therapy, OT  Treatment Goals addressed: Coping  Interventions:Psychosocial skills training, Supportive,   Summary:Occupational Therapy group  Therapist Response: Patient engaged in group. See OT note.       Session Time: 12:00- 1:00  Participation Level:Active  Behavioral Response:CasualAlertDepressed  Type of Therapy: Group Therapy, Psychoeducation; Psychotherapy  Treatment Goals addressed: Coping  Interventions:CBT; Solution focused; Supportive; Reframing  Summary:12:00 - 12:50Cln continued  topic of cognitive distortions. Group did activity  in which they listened to/read conversations and then analyzed for distorted thinking and offered challenges.  12:50 -1:00 Clinician led check-out. Clinician assessed for immediate needs, medication compliance and efficacy, and safety concerns   Therapist Response: Pt is able to identify distorted thinking within examples and challenge them, but when it comes to personal examples she struggles with the challenges.  At checkout, pt rates her mood at a6.5on a scale of 1-10 with 10 being great. Patient reports afternoon plans of going to her best friend's house.Patient denies SI/HI/self-harm at the end of group.      Suicidal/Homicidal: Nowithout intent/plan   Plan: Pt will continue in PHP while working to decrease depression symptoms, decrease SI, and increase ability to manage symptoms in a healthy manner as they arise.   Diagnosis: Bipolar affective disorder, depressed, severe (HCC) [F31.4]    1. Bipolar affective disorder, depressed, severe (HCC)   2. Borderline personality disorder (HCC)       Donia GuilesJenny Naturi Alarid, LCSW 05/01/2019

## 2019-05-01 NOTE — Progress Notes (Signed)
    Daily Group Progress Note  Program: IOP  Group Time: 9am-12pm  Participation Level: Active  Behavioral Response: Appropriate  Type of Therapy:  Group Therapy; process group, psycho-educational group  Summary of Progress:  The purpose of the group is to utilize CBT and DBT skills to increase use of healthy coping skills and decrease frequency and intensity of active mental health symptoms. 9am-10:30am Clinician checked in with clients, assessing for SI/HI/psychosis and overall level of functioning. Clinician inquired about any recently attempted coping skills and their effectiveness.  Clinician utilized Loaded Questions to process with clients bad habits known but not changed, and addressing insecurities and criticism. Clinician praised clients for utilizing distress tolerance skills, including opposite action to remain engaged in conversation despite feelings if discomfort. Clinician led clients in Guided visualization as new skill to help manage anxiety and racing thoughts. 10:30am-12pm Clinician presented the topic of Radical Acceptance. Clinician reviewed CBT triangle and the connection of thoughts, feelings, and behaviors. Clinician and group members discussed what is and is not radical acceptance, what things need to be accepted and why as well as factors that interfere with acceptance. Clinician and group discussed what is something they would like to radically accept and the current barriers. Clinician requested clients identify a positive self-trait and discussed difficulty with positive self-talk as well as the importance of strengthening this skill. Client engaged in group discussions. Client was able to utilize CBT triangle to identify thoughts and feelings related to a behavior, as well as create alternative realistic thoughts. Client processed being anxious about what people would be saying from work about her mental health. Client was receptive to support from group members.  Client identified that answering questions in group caused some anxiety and agreed it was a strength to remain in group participating rather than leave or disengage. Client found visualization helpful.  Harlon Ditty, LCSW

## 2019-05-02 ENCOUNTER — Other Ambulatory Visit (HOSPITAL_COMMUNITY): Payer: BLUE CROSS/BLUE SHIELD | Admitting: Psychiatry

## 2019-05-02 ENCOUNTER — Other Ambulatory Visit: Payer: Self-pay

## 2019-05-02 DIAGNOSIS — F603 Borderline personality disorder: Secondary | ICD-10-CM

## 2019-05-02 DIAGNOSIS — F419 Anxiety disorder, unspecified: Secondary | ICD-10-CM | POA: Diagnosis not present

## 2019-05-02 DIAGNOSIS — Z79899 Other long term (current) drug therapy: Secondary | ICD-10-CM | POA: Diagnosis not present

## 2019-05-02 DIAGNOSIS — F314 Bipolar disorder, current episode depressed, severe, without psychotic features: Secondary | ICD-10-CM | POA: Diagnosis not present

## 2019-05-02 DIAGNOSIS — F909 Attention-deficit hyperactivity disorder, unspecified type: Secondary | ICD-10-CM | POA: Diagnosis not present

## 2019-05-02 DIAGNOSIS — R4589 Other symptoms and signs involving emotional state: Secondary | ICD-10-CM

## 2019-05-02 DIAGNOSIS — R45851 Suicidal ideations: Secondary | ICD-10-CM | POA: Diagnosis not present

## 2019-05-03 ENCOUNTER — Encounter (HOSPITAL_COMMUNITY): Payer: Self-pay

## 2019-05-03 ENCOUNTER — Encounter (HOSPITAL_COMMUNITY): Payer: Self-pay | Admitting: Psychiatry

## 2019-05-03 ENCOUNTER — Other Ambulatory Visit (HOSPITAL_COMMUNITY): Payer: BLUE CROSS/BLUE SHIELD | Admitting: Psychiatry

## 2019-05-03 ENCOUNTER — Other Ambulatory Visit: Payer: Self-pay

## 2019-05-03 DIAGNOSIS — R4589 Other symptoms and signs involving emotional state: Secondary | ICD-10-CM

## 2019-05-03 DIAGNOSIS — F314 Bipolar disorder, current episode depressed, severe, without psychotic features: Secondary | ICD-10-CM | POA: Diagnosis not present

## 2019-05-03 DIAGNOSIS — Z79899 Other long term (current) drug therapy: Secondary | ICD-10-CM | POA: Diagnosis not present

## 2019-05-03 DIAGNOSIS — M25631 Stiffness of right wrist, not elsewhere classified: Secondary | ICD-10-CM | POA: Diagnosis not present

## 2019-05-03 DIAGNOSIS — M25532 Pain in left wrist: Secondary | ICD-10-CM | POA: Diagnosis not present

## 2019-05-03 DIAGNOSIS — R45851 Suicidal ideations: Secondary | ICD-10-CM | POA: Diagnosis not present

## 2019-05-03 DIAGNOSIS — S66922D Laceration of unspecified muscle, fascia and tendon at wrist and hand level, left hand, subsequent encounter: Secondary | ICD-10-CM | POA: Diagnosis not present

## 2019-05-03 DIAGNOSIS — F419 Anxiety disorder, unspecified: Secondary | ICD-10-CM | POA: Diagnosis not present

## 2019-05-03 DIAGNOSIS — F603 Borderline personality disorder: Secondary | ICD-10-CM

## 2019-05-03 DIAGNOSIS — F909 Attention-deficit hyperactivity disorder, unspecified type: Secondary | ICD-10-CM | POA: Diagnosis not present

## 2019-05-03 DIAGNOSIS — S66921D Laceration of unspecified muscle, fascia and tendon at wrist and hand level, right hand, subsequent encounter: Secondary | ICD-10-CM | POA: Diagnosis not present

## 2019-05-03 NOTE — Progress Notes (Signed)
Virtual Visit via Video Note  I connected with Shelby Gallegos on 05/03/19 at  9:00 AM EDT by a video enabled telemedicine application and verified that I am speaking with the correct person using two identifiers.  Location: Patient: Shelby Gallegos Provider: Hilbert Odor, LCSW   I discussed the limitations of evaluation and management by telemedicine and the availability of in person appointments. The patient expressed understanding and agreed to proceed.  History of Present Illness: Bipolar affective disorder, Borderline personality disorder,and difficulty coping due to adverse life experiences.    Observations/Objective: Hira participated well and was engaged in giving feedback and in the therapy activities in group today. Counselor checked in with all group members to assess current mood and functioning. Fox was less nervous today and looking forward to learning. Counselor engaged the group in psychoeducation and skill development on grounding techniques. Shelbey shared when called on and offered ways she intended to utilize these skills in everyday situations. She particularly liked the 5 senses activities and movement activities. Counselor shared a video on Setting Boundaries and processed the concepts with group members. Jalise shared about personal and family relationships and how she wants to be more assertive in her communicating up front. Counselor closed with checking in with all about their self-care plan and productivity task for the day.   Assessment and Plan: Counselor recommends that [facility] remains in IOP treatment to continue work on treatment plan goals and better management of symptoms. She should continue taking medications as prescribed, follow safety and crisis plan and implement skills used in session.   Follow Up Instructions: Counselor will send out Webex Link to connect to IOP for tomorrow.    I discussed the assessment and treatment plan with the patient. The patient was  provided an opportunity to ask questions and all were answered. The patient agreed with the plan and demonstrated an understanding of the instructions.   The patient was advised to call back or seek an in-person evaluation if the symptoms worsen or if the condition fails to improve as anticipated.  I provided 180 minutes of non-face-to-face time during this encounter.   Hilbert Odor, LCSW
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
## 2019-05-03 NOTE — Progress Notes (Signed)
Virtual Visit via Video Note  I connected with Shelby Gallegos on 05/03/19 at  9:00 AM EDT by a video enabled telemedicine application and verified that I am speaking with the correct person using two identifiers.  Location: Patient: Shelby Gallegos Provider: Hilbert Odor, LCSW   I discussed the limitations of evaluation and management by telemedicine and the availability of in person appointments. The patient expressed understanding and agreed to proceed.  History of Present Illness: Bipolar affective disorder, depressed, sever, Borderline Personality Disorder and difficulty coping due to adverse life experiences.    Observations/Objective: Counselor checked in with all group members to determine current state and mood. Arion shared that she was feeling ok today. Counselor and group celebrated a group member who is graduating today from the program. Ciomara was able to share positive healthy comments about the group member. Counselor prompted the group members to share a goal they are hoping to achieve during group. Yanette stated that she wants to work on tapping into what led to her suicide attempt and put preventative measures in place to work towards long-term recovery again. Counselor introduced the Doctor, hospital, Theda [place], [facility] who covered grief and loss issues. Sekai noted that she has lost a sense of identity with loosing her job and would like to work on finding a new identity. Counselor allowed group members to journal/self-reflect on the presentation and processed out loud their major takeaways. Counselor introduced a second Doctor, hospital who walked the group through two yoga practices. [facility] participated fully and shared that she benefits from the experience. Counselor summarized session and group members shared their intentions for the afternoon.   Assessment and Plan: Counselor recommends that [facility] remain in IOP treatment to continue addressing mental health symptoms and  treatment plan goals. It is recommended that [facility] take medications as prescribed, apply skills learned in session, follow safety and crisis plan and communicate needs with support system.   Follow Up Instructions: Counselor will send link for tomorrow's group meeting via Webex.    I discussed the assessment and treatment plan with the patient. The patient was provided an opportunity to ask questions and all were answered. The patient agreed with the plan and demonstrated an understanding of the instructions.   The patient was advised to call back or seek an in-person evaluation if the symptoms worsen or if the condition fails to improve as anticipated.  I provided 180 minutes of non-face-to-face time during this encounter.   Hilbert Odor, LCSW

## 2019-05-04 ENCOUNTER — Other Ambulatory Visit: Payer: Self-pay

## 2019-05-04 ENCOUNTER — Other Ambulatory Visit (HOSPITAL_COMMUNITY): Payer: BLUE CROSS/BLUE SHIELD | Admitting: Licensed Clinical Social Worker

## 2019-05-04 DIAGNOSIS — F909 Attention-deficit hyperactivity disorder, unspecified type: Secondary | ICD-10-CM | POA: Diagnosis not present

## 2019-05-04 DIAGNOSIS — F314 Bipolar disorder, current episode depressed, severe, without psychotic features: Secondary | ICD-10-CM | POA: Diagnosis not present

## 2019-05-04 DIAGNOSIS — Z79899 Other long term (current) drug therapy: Secondary | ICD-10-CM | POA: Diagnosis not present

## 2019-05-04 DIAGNOSIS — F419 Anxiety disorder, unspecified: Secondary | ICD-10-CM | POA: Diagnosis not present

## 2019-05-04 DIAGNOSIS — R45851 Suicidal ideations: Secondary | ICD-10-CM | POA: Diagnosis not present
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
## 2019-05-07 ENCOUNTER — Other Ambulatory Visit: Payer: Self-pay

## 2019-05-07 ENCOUNTER — Encounter (HOSPITAL_COMMUNITY): Payer: Self-pay | Admitting: Psychiatry

## 2019-05-07 ENCOUNTER — Other Ambulatory Visit (HOSPITAL_COMMUNITY): Payer: BLUE CROSS/BLUE SHIELD | Admitting: Psychiatry

## 2019-05-07 DIAGNOSIS — F603 Borderline personality disorder: Secondary | ICD-10-CM

## 2019-05-07 DIAGNOSIS — R4589 Other symptoms and signs involving emotional state: Secondary | ICD-10-CM

## 2019-05-07 DIAGNOSIS — F314 Bipolar disorder, current episode depressed, severe, without psychotic features: Secondary | ICD-10-CM

## 2019-05-07 DIAGNOSIS — F909 Attention-deficit hyperactivity disorder, unspecified type: Secondary | ICD-10-CM | POA: Diagnosis not present

## 2019-05-07 DIAGNOSIS — R45851 Suicidal ideations: Secondary | ICD-10-CM | POA: Diagnosis not present

## 2019-05-07 DIAGNOSIS — F419 Anxiety disorder, unspecified: Secondary | ICD-10-CM | POA: Diagnosis not present

## 2019-05-07 DIAGNOSIS — Z79899 Other long term (current) drug therapy: Secondary | ICD-10-CM | POA: Diagnosis not present

## 2019-05-07 NOTE — Progress Notes (Signed)
Virtual Visit via Video Note  I connected with Shelby Gallegos on 05/07/19 at  9:00 AM EDT by a video enabled telemedicine application and verified that I am speaking with the correct person using two identifiers.  Location: Patient: Shelby Gallegos Provider: Lise Auer, LCSW   I discussed the limitations of evaluation and management by telemedicine and the availability of in person appointments. The patient expressed understanding and agreed to proceed.  History of Present Illness: Bipolar affective disorder, depressed, severe, Borderline Personality Disorder and difficulty.    Observations/Objective: Counselor started group by asking participants to connect with themselves for a few moments to identify 3 things they were currently feeling. Shelby Gallegos shared many anxieties and frustrations about what's next in her future and how she plans to scale back to being more independent. Counselor then engaged the group in spending time reflecting on what goals they are currently most focused on achieving in therapy. Shelby Gallegos reported having chronic passive suicidal ideations, with no intent, means or plan. We processed safety considerations in communicating about these within her support system and with professionals. Counselor took note of theses goals and will focus topics/treatment around her and other group members needs. Counselor then spent the remainder of group teaching cognitive coping skills. Group members gave examples of anxiety provoking situations, automatic negative thoughts and how they would apply the skills learned in session. All group members closed by sharing a self-care action plan for the remainder of the day. Shelby Gallegos planned to do some decompressing and distraction tasks.   Assessment and Plan: Counselor recommends that [facility] continue in IOP group treatment to continue work on treatment plan goals and to better manage mental health symptoms.   Follow Up Instructions: Counselor will send  Webex link for tomorrow's session for access to treatment.    I discussed the assessment and treatment plan with the patient. The patient was provided an opportunity to ask questions and all were answered. The patient agreed with the plan and demonstrated an understanding of the instructions.   The patient was advised to call back or seek an in-person evaluation if the symptoms worsen or if the condition fails to improve as anticipated.  I provided 180 minutes of non-face-to-face time during this encounter.   Lise Auer, LCSW

## 2019-05-08 ENCOUNTER — Other Ambulatory Visit (HOSPITAL_COMMUNITY): Payer: BLUE CROSS/BLUE SHIELD | Admitting: Licensed Clinical Social Worker

## 2019-05-08 ENCOUNTER — Other Ambulatory Visit: Payer: Self-pay

## 2019-05-08 DIAGNOSIS — R45851 Suicidal ideations: Secondary | ICD-10-CM | POA: Diagnosis not present

## 2019-05-08 DIAGNOSIS — F909 Attention-deficit hyperactivity disorder, unspecified type: Secondary | ICD-10-CM | POA: Diagnosis not present

## 2019-05-08 DIAGNOSIS — F419 Anxiety disorder, unspecified: Secondary | ICD-10-CM | POA: Diagnosis not present

## 2019-05-08 DIAGNOSIS — Z79899 Other long term (current) drug therapy: Secondary | ICD-10-CM | POA: Diagnosis not present

## 2019-05-08 DIAGNOSIS — F314 Bipolar disorder, current episode depressed, severe, without psychotic features: Secondary | ICD-10-CM

## 2019-05-09 ENCOUNTER — Other Ambulatory Visit: Payer: Self-pay

## 2019-05-09 ENCOUNTER — Encounter (HOSPITAL_COMMUNITY): Payer: Self-pay

## 2019-05-09 ENCOUNTER — Other Ambulatory Visit (HOSPITAL_COMMUNITY): Payer: BLUE CROSS/BLUE SHIELD | Admitting: Psychiatry

## 2019-05-09 DIAGNOSIS — F909 Attention-deficit hyperactivity disorder, unspecified type: Secondary | ICD-10-CM | POA: Diagnosis not present

## 2019-05-09 DIAGNOSIS — Z79899 Other long term (current) drug therapy: Secondary | ICD-10-CM | POA: Diagnosis not present

## 2019-05-09 DIAGNOSIS — R45851 Suicidal ideations: Secondary | ICD-10-CM | POA: Diagnosis not present

## 2019-05-09 DIAGNOSIS — F419 Anxiety disorder, unspecified: Secondary | ICD-10-CM | POA: Diagnosis not present

## 2019-05-09 DIAGNOSIS — F314 Bipolar disorder, current episode depressed, severe, without psychotic features: Secondary | ICD-10-CM | POA: Diagnosis not present

## 2019-05-09 DIAGNOSIS — R4589 Other symptoms and signs involving emotional state: Secondary | ICD-10-CM

## 2019-05-09 DIAGNOSIS — F603 Borderline personality disorder: Secondary | ICD-10-CM
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
## 2019-05-09 NOTE — Progress Notes (Signed)
Virtual Visit via Video Note  I connected with Shelby Gallegos on 05/09/19 at  9:00 AM EDT by a video enabled telemedicine application and verified that I am speaking with the correct person using two identifiers.  Location: Patient: Shelby Gallegos Provider: Lise Auer, LCSW   I discussed the limitations of evaluation and management by telemedicine and the availability of in person appointments. The patient expressed understanding and agreed to proceed.  History of Present Illness: Bipolar 1 disorder, borderline personality disorder and difficulty coping due to recent suicide attempt, loss of job and other adverse life experiences.    Observations/Objective: Counselor and Case Manager to check in with all group members to determine needs, share discharge dates, give paperwork reminders and to assess current mood and functioning. Arthur presented as happy, engaged and optimistic today. Counselor then introduced [employer], [place] for [employer], as he presents on Grief and Loss issues. Avory processed losses with Mikki Santee and identified areas where she needs to focus more energy on forgiving herself and letting go through exploring the areas of guilt more. Counselor explored fears that come with Grief and Loss after Mikki Santee left the meeting and allowed group members to reflect thoughts and feelings on paper. Counselor introduced the practice and benefits of Laughter Yoga to the group. All group members participated and shared feedback about their experience. Destina shared that she enjoyed the childlike play aspects and that she needed the laughter and benefits of the practice today. Counselor allowed all group members to share take-aways from today's session and to identify a self-care action step they can take today. Sanyah was inspired by today's session and wanted to create a video inspired by her learning's. Amellia also shared with the group that seeing everyone smiling and having a good time gave her hope  again.   Assessment and Plan: Counselor recommends that [facility] remain in IOP group treatment to learn to better manage mental health symptoms and to continue addressing treatment plan goals. Counselor recommends that medication continue as prescribed, safety plan be followed and that she find ways to incorporated skills learned in treatment to her everyday life situations. Lexii requested to stay in the group longer than authorized, so she will discuss this in more detail with the case manager.   Follow Up Instructions: Counselor will send Webex link for tomorrow's session.     I discussed the assessment and treatment plan with the patient. The patient was provided an opportunity to ask questions and all were answered. The patient agreed with the plan and demonstrated an understanding of the instructions.   The patient was advised to call back or seek an in-person evaluation if the symptoms worsen or if the condition fails to improve as anticipated.  I provided 180 minutes of non-face-to-face time during this encounter.   Lise Auer, LCSW

## 2019-05-10 ENCOUNTER — Other Ambulatory Visit (HOSPITAL_COMMUNITY): Payer: BLUE CROSS/BLUE SHIELD | Admitting: Psychiatry

## 2019-05-10 ENCOUNTER — Other Ambulatory Visit: Payer: Self-pay

## 2019-05-10 DIAGNOSIS — F603 Borderline personality disorder: Secondary | ICD-10-CM

## 2019-05-10 DIAGNOSIS — R4589 Other symptoms and signs involving emotional state: Secondary | ICD-10-CM

## 2019-05-10 DIAGNOSIS — Z79899 Other long term (current) drug therapy: Secondary | ICD-10-CM | POA: Diagnosis not present

## 2019-05-10 DIAGNOSIS — M25631 Stiffness of right wrist, not elsewhere classified: Secondary | ICD-10-CM | POA: Diagnosis not present

## 2019-05-10 DIAGNOSIS — S66921D Laceration of unspecified muscle, fascia and tendon at wrist and hand level, right hand, subsequent encounter: Secondary | ICD-10-CM | POA: Diagnosis not present

## 2019-05-10 DIAGNOSIS — F909 Attention-deficit hyperactivity disorder, unspecified type: Secondary | ICD-10-CM | POA: Diagnosis not present

## 2019-05-10 DIAGNOSIS — S66922D Laceration of unspecified muscle, fascia and tendon at wrist and hand level, left hand, subsequent encounter: Secondary | ICD-10-CM | POA: Diagnosis not present

## 2019-05-10 DIAGNOSIS — M25532 Pain in left wrist: Secondary | ICD-10-CM | POA: Diagnosis not present

## 2019-05-10 DIAGNOSIS — F314 Bipolar disorder, current episode depressed, severe, without psychotic features: Secondary | ICD-10-CM

## 2019-05-10 DIAGNOSIS — F419 Anxiety disorder, unspecified: Secondary | ICD-10-CM | POA: Diagnosis not present

## 2019-05-10 DIAGNOSIS — R45851 Suicidal ideations: Secondary | ICD-10-CM | POA: Diagnosis not present

## 2019-05-10 NOTE — Progress Notes (Signed)
Virtual Visit via Video Note  I connected with Shelby Gallegos on 05/10/19 at  9:00 AM EDT by a video enabled telemedicine application and verified that I am speaking with the correct person using two identifiers.  Location: Patient: Shelby Gallegos Provider: Lise Auer, LCSW   I discussed the limitations of evaluation and management by telemedicine and the availability of in person appointments. The patient expressed understanding and agreed to proceed.  History of Present Illness: Bipolar Affective Disorder, Borderline personality disorder and difficulty coping due to adverse life experiences.    Observations/Objective: Counselor and Case Manager greeted all IOP participants and followed up on auths and paperwork. Counselor introduced a new member to the group and did a brief check in with all members to gauge their state of being. Counselor then shared a video Shelby Gallegos made for the group, inspired by yesterday's laughter yoga. Counselor thanked her for her thoughtfulness and creativity. Counselor engaged the group in a [employer], sharing mental health resources for all to have to increase their support in managing their mental health. Shelby Gallegos shared several resources and noted that there were many shared that she is looking forward to contacting and learning more about. Counselor then walked the group through creating their own safety and crisis plan. Shelby Gallegos shared information she included on her plan and connected with others experiences as they shared as well. Counselor encouraged group members to share the plan with important people in their lives for added support. Shelby Gallegos noted that she would like to share hers with her sister. Counselor praised all for their hard work in group and encouraged self-care.   Assessment and Plan: Counselor recommends that [name] remian engaged in IOP treatment. She needs to continue taking medications as prescribed, following doctors orders, and crisis plan, utilizing  coping skills to manage MH symptoms.   Follow Up Instructions: Counselor will send link for Webex to join IOP group tomorrow.    I discussed the assessment and treatment plan with the patient. The patient was provided an opportunity to ask questions and all were answered. The patient agreed with the plan and demonstrated an understanding of the instructions.   The patient was advised to call back or seek an in-person evaluation if the symptoms worsen or if the condition fails to improve as anticipated.  I provided 180 minutes of non-face-to-face time during this encounter.   Lise Auer, LCSW

## 2019-05-10 NOTE — Psych (Signed)
Virtual Visit via Video Note  I connected with Shelby Gallegos on 04/24/19 at  9:00 AM EDT by a video enabled telemedicine application and verified that I am speaking with the correct person using two identifiers.   I discussed the limitations of evaluation and management by telemedicine and the availability of in person appointments. The patient expressed understanding and agreed to proceed.  I discussed the assessment and treatment plan with the patient. The patient was provided an opportunity to ask questions and all were answered. The patient agreed with the plan and demonstrated an understanding of the instructions.   The patient was advised to call back or seek an in-person evaluation if the symptoms worsen or if the condition fails to improve as anticipated.  Pt was provided 240 minutes of non-face-to-face time during this encounter.   Donia GuilesJenny Shalece Staffa, LCSW    [facility] THERAPIST PROGRESS NOTE  Shelby MuirKelli Gallegos [id number]  Session Time: 9:00 - 10:00  Participation Level: Active  Behavioral Response: CasualAlertDepressed  Type of Therapy: Group Therapy  Treatment Goals addressed: Coping  Interventions: CBT, DBT, Solution Focused, Supportive and Reframing  Summary: Clinician led check-in regarding current stressors and situation, and review of patient completed daily inventory. Clinician utilized active listening and empathetic response and validated patient emotions. Clinician facilitated processing group on pertinent issues.   Therapist Response: Shelby MuirKelli Gallegos is a 38 y.o. female who presents with depression and personality symotoms. Patient arrived within time allowed and reports that she is feeling "optimistic." Patient rates her mood at a 8 on a scale of 1-10 with 10 being great. Pt reports [place] the [place] went [place]really [place]." Pt shares that she had a positive time with family and enjoyed her birthday. Pt states after coming back from the lake, she was in a "slump" on  Monday. Pt reports struggling with negative thinking and having more intense SI. Pt continues to deny plan and intent but states the thoughts of it being better not to be alive were "louder." Pt shares she tried to challenge her negative thinking but ended up just ruminating. Pt states she woke up in a better place and feels like knowing she had group today helped. Pt continues to struggle with applying skills when in a worse place. Pt able to process. Pt engaged in discussion.        Session Time: 10:00 -11:00  Participation Level:Active  Behavioral Response:CasualAlertDepressed  Type of Therapy: Group Therapy, psychoeducation, psychotherapy  Treatment Goals addressed: Coping  Interventions:CBT, DBT, Solution Focused, Supportive and Reframing  Summary:Cln introduced topic of boundaries. Cln provided psychoeducation on what boundaries are and porous, rigid and healthy boundary characteristics. Group discussed how they feel their boundaries are.   Therapist Response: Pt identifies that she has mainly porous boundaries and that she tends to be passive about her needs. Pt appears overwhelmed with concept of asserting boundaries.        Session Time: 11:00 - 12:00  Participation Level:Active  Behavioral Response:CasualAlertDepressed  Type of Therapy: Group Therapy, OT  Treatment Goals addressed: Coping  Interventions:Psychosocial skills training, Supportive,   Summary:Occupational Therapy group  Therapist Response: Patient engaged in group. See OT note.       Session Time: 12:00- 1:00  Participation Level:Active  Behavioral Response:CasualAlertDepressed  Type of Therapy: Group Therapy, Psychoeducation; Psychotherapy  Treatment Goals addressed: Coping  Interventions:CBT; Solution focused; Supportive; Reframing  Summary:12:00 - 12:50Cln continued topic of boundaries and provided education on the different types of  boundaries. Group brainstormed ways they  may be experiencing boundary issues in each category.   12:50 -1:00 Clinician led check-out. Clinician assessed for immediate needs, medication compliance and efficacy, and safety concerns   Therapist Response: Pt reports understanding of the boundary categories. Pt identifies potential boundary issue of not addressing the discomfort she feels when her friend and friend's boyfriend argue in front of her.  At checkout, pt rates her mood at [facility] a scale of 1-10 with 10 being great. Patient reports afternoon plans of doing bible study prep. Pt demonstrates some progress as evidenced by reporting her weekend was the most consistent length of time that she has felt positive since hospitalization.Patient denies SI/HI/self-harm at the end of group.      Suicidal/Homicidal: Nowithout intent/plan   Plan: Pt will continue in PHP while working to decrease depression symptoms, decrease SI, and increase ability to manage symptoms in a healthy manner as they arise.   Diagnosis: Bipolar affective disorder, depressed, severe ([place]) [F31.4]    1. Bipolar affective disorder, depressed, severe ([place])   2. Borderline personality disorder ([place])       [name], LCSW 05/10/2019

## 2019-05-11 ENCOUNTER — Other Ambulatory Visit: Payer: Self-pay

## 2019-05-11 ENCOUNTER — Other Ambulatory Visit (HOSPITAL_COMMUNITY): Payer: BLUE CROSS/BLUE SHIELD | Admitting: Licensed Clinical Social Worker

## 2019-05-11 DIAGNOSIS — F314 Bipolar disorder, current episode depressed, severe, without psychotic features: Secondary | ICD-10-CM

## 2019-05-11 DIAGNOSIS — F909 Attention-deficit hyperactivity disorder, unspecified type: Secondary | ICD-10-CM | POA: Diagnosis not present

## 2019-05-11 DIAGNOSIS — Z79899 Other long term (current) drug therapy: Secondary | ICD-10-CM | POA: Diagnosis not present

## 2019-05-11 DIAGNOSIS — F419 Anxiety disorder, unspecified: Secondary | ICD-10-CM | POA: Diagnosis not present

## 2019-05-11 DIAGNOSIS — R45851 Suicidal ideations: Secondary | ICD-10-CM | POA: Diagnosis not present

## 2019-05-11 NOTE — Psych (Signed)
Virtual Visit via Video Note  I connected with Shelby Gallegos on 04/25/19 at  9:00 AM EDT by a video enabled telemedicine application and verified that I am speaking with the correct person using two identifiers.   I discussed the limitations of evaluation and management by telemedicine and the availability of in person appointments. The patient expressed understanding and agreed to proceed.  I discussed the assessment and treatment plan with the patient. The patient was provided an opportunity to ask questions and all were answered. The patient agreed with the plan and demonstrated an understanding of the instructions.   The patient was advised to call back or seek an in-person evaluation if the symptoms worsen or if the condition fails to improve as anticipated.  Pt was provided 240 minutes of non-face-to-face time during this encounter.   Donia GuilesJenny Norely Schlick, LCSW    [facility] THERAPIST PROGRESS NOTE  Shelby Gallegos [id number]  Session Time: 9:00 - 10:00  Participation Level: Active  Behavioral Response: CasualAlertDepressed  Type of Therapy: Group Therapy  Treatment Goals addressed: Coping  Interventions: CBT, DBT, Solution Focused, Supportive and Reframing  Summary: Clinician led check-in regarding current stressors and situation, and review of patient completed daily inventory. Clinician utilized active listening and empathetic response and validated patient emotions. Clinician facilitated processing group on pertinent issues.   Therapist Response: Shelby Gallegos is a 38 y.o. female who presents with depression and personality symptoms. Patient arrived within time allowed and reports that she is feeling "emotional." Patient rates her mood at a 7 on a scale of 1-10 with 10 being great. Pt reports experiencing heightened anxiety and racing thoughts due to upcoming planned discharge stating "I don't do well with change." Pt states she did "a lot of thought dumping" last night to manage  her feelings and it helped her maintain. Pt shares she is supposed to stay with her friend the following 2 nights and is concerned about that as well because it has been a stressful experience the past two visits. Pt able to process. Pt engaged in discussion.        Session Time: 10:00 -11:00  Participation Level:Active  Behavioral Response:CasualAlertDepressed  Type of Therapy: Group Therapy, psychotherapy  Treatment Goals addressed: Coping  Interventions:Strengths based, reframing, Supportive,   Summary:Spiritual Care group  Therapist Response: Patient engaged in group. See chaplain note.         Session Time: 11:00 -12:00  Participation Level:Active  Behavioral Response:CasualAlertDepressed  Type of Therapy: Group Therapy, psychoeducation, psychotherapy  Treatment Goals addressed: Coping  Interventions:CBT, DBT, Solution Focused, Supportive and Reframing  Summary:Cln led discussion on being honest with ourselves. Group members shared situations in which they are struggling and where being honest about what they are feeling will help in the decision making process.   Therapist Response: Patient states she is staying with her friend because it is part of her safety plan to not be alone right now and her parents will be out of the house. Pt shares that the stress she feels at witnessing her friend and friend's boyfriend fight is a big struggle for her and she has considered breaking the safety plan to stay at home by herself. Pt is able to process. Pt brainstorms alternatives to being at her friend's house such as friend coming to stay with pt or pt staying with her sister.         Session Time: 12:00- 1:00  Participation Level:Active  Behavioral Response:CasualAlertDepressed  Type of Therapy: Group Therapy, Psychoeducation  Treatment Goals addressed: Coping  Interventions:relaxation training; Supportive;  Reframing  Summary:12:00 - 12:50: Relaxation group: Cln led group focused on retraining the body's response to stress.  12:50 -1:00 Clinician led check-out. Clinician assessed for immediate needs, medication compliance and efficacy, and safety concerns  Therapist Response:Pt engaged in activity and discussion. At checkout, pt rates her mood at a7.5on a scale of 1-10 with 10 being great. Patient reports afternoon plans of working on her DBT workbook and watching a race with her parents. Pt demonstrates some progress as evidenced by insight into her need to follow her safety plan.Patient denies SI/HI/self-harm at the end of group.      Suicidal/Homicidal: Nowithout intent/plan   Plan: Pt will continue in PHP while working to decrease depression symptoms, decrease SI, and increase ability to manage symptoms in a healthy manner as they arise.   Diagnosis: Bipolar affective disorder, depressed, severe ([place]) [F31.4]    1. Bipolar affective disorder, depressed, severe ([place])   2. Borderline personality disorder ([place])       [name], LCSW 05/11/2019

## 2019-05-11 NOTE — Psych (Signed)
Virtual Visit via Video Note  I connected with Shelby Gallegos on 04/10/19 at  9:00 AM EDT by a video enabled telemedicine application and verified that I am speaking with the correct person using two identifiers.   I discussed the limitations of evaluation and management by telemedicine and the availability of in person appointments. The patient expressed understanding and agreed to proceed.  I discussed the assessment and treatment plan with the patient. The patient was provided an opportunity to ask questions and all were answered. The patient agreed with the plan and demonstrated an understanding of the instructions.   The patient was advised to call back or seek an in-person evaluation if the symptoms worsen or if the condition fails to improve as anticipated.  Shelby Gallegos was provided 240 minutes of non-face-to-face time during this encounter.   Royetta Crochet, [facility]    [facility] PHP THERAPIST PROGRESS NOTE  Stephonie Wilcoxen [id number]  Session Time: 9:00 - 10:00  Participation Level: Active  Behavioral Response: CasualAlertDepressed  Type of Therapy: Group Therapy  Treatment Goals addressed: Coping  Interventions: CBT, DBT, Solution Focused, Supportive and Reframing  Summary: Clinician led check-in regarding current stressors and situation, and review of patient completed daily inventory. Clinician utilized active listening and empathetic response and validated patient emotions. Clinician facilitated processing group on pertinent issues.   Therapist Response: Shelby Gallegos is a 38 y.o. female who presents with depression and personality symotoms. Patient arrived within time allowed and reports that she is feeling "good." Patient rates her mood at a 7 on a scale of 1-10 with 10 being great. Shelby Gallegos reports she spent time with friends last night that helped with distracting her and improving her mood. Shelby Gallegos reports she is struggling to find purpose in life and feeling guilt and shame over her attempt.  Shelby Gallegos is able to process.  Shelby Gallegos engaged in discussion.        Session Time: 10:00 -11:00  Participation Level:Active  Behavioral Response:CasualAlertDepressed  Type of Therapy: Group Therapy, psychoeducation, psychotherapy  Treatment Goals addressed: Coping  Interventions:CBT, DBT, Solution Focused, Supportive and Reframing  Summary:  Cln led discussion on positive affirmations. Group discussed how positive statements about self can be harder to believe than negative. Group discussed ways to use positive affirmations in every day life to help with mental health.   Therapist Response: Shelby Gallegos engaged in discussion. Shelby Gallegos reports she struggles believing others when they say positive things about her. Shelby Gallegos is able to identify 10 positive affirmations about self and write them down. Shelby Gallegos reports she will post positive affirmations around her home to remind herself to read them daily.     Session Time: 11:00 - 12:00  Participation Level:Active  Behavioral Response:CasualAlertDepressed  Type of Therapy: Group Therapy, OT  Treatment Goals addressed: Coping  Interventions:Psychosocial skills training, Supportive,   Summary:Occupational Therapy group  Therapist Response: Patient engaged in group. See OT note.       Session Time: 12:00- 1:00  Participation Level:Active  Behavioral Response:CasualAlertDepressed  Type of Therapy: Group Therapy, Psychoeducation; Psychotherapy  Treatment Goals addressed: Coping  Interventions:CBT; Solution focused; Supportive; Reframing  Summary: 12:00 - 12:50 Clinician continued topic of "Distress Tolerance". Group discussed Self Soothe and how/when patients can employ these methods to help. Patients identified when these techniques may be helpful in their personal lives. 12:50 -1:00 Clinician led check-out. Clinician assessed for immediate needs, medication compliance and efficacy, and safety concerns      Therapist Response: Shelby Gallegos engaged in discussion. Shelby Gallegos identified hearing as  most effective sense to use for self-soothe. Shelby Gallegos reports listening to nature and music will help her self-soothe.    At checkout, Shelby Gallegos rates her mood at an 8 on a scale of 1-10 with 10 being great. Patient reports afternoon plans of driving to parents' house and hanging out with them. Patient demonstrates some progress as evidenced by being able to identify positive affirmations about self and being willing to use them. Shelby Gallegos denies SI/HI at end of group.   Suicidal/Homicidal: Nowithout intent/plan   Plan: Shelby Gallegos will continue in PHP while working to decrease depression symptoms, decrease SI, and increase ability to manage symptoms in a healthy manner as they arise.   Diagnosis: Bipolar affective disorder, depressed, severe (HCC) [F31.4]    1. Bipolar affective disorder, depressed, severe (HCC)   2. Borderline personality disorder [facility](HCC)     Quinn AxeWhitney J Danita Proud, [facility] 05/11/2019

## 2019-05-12 NOTE — Psych (Signed)
Virtual Visit via Video Note  I connected with Shelby Gallegos on 04/27/19 at  9:00 AM EDT by a video enabled telemedicine application and verified that I am speaking with the correct person using two identifiers.   I discussed the limitations of evaluation and management by telemedicine and the availability of in person appointments. The patient expressed understanding and agreed to proceed.  I discussed the assessment and treatment plan with the patient. The patient was provided an opportunity to ask questions and all were answered. The patient agreed with the plan and demonstrated an understanding of the instructions.   The patient was advised to call back or seek an in-person evaluation if the symptoms worsen or if the condition fails to improve as anticipated.  Pt was provided 240 minutes of non-face-to-face time during this encounter.   Shelby GuilesJenny Alexias Margerum, LCSW    [facility] THERAPIST PROGRESS NOTE  Shelby MuirKelli Gallegos [id number]  Session Time: 9:00 - 10:00  Participation Level: Active  Behavioral Response: CasualAlertDepressed  Type of Therapy: Group Therapy  Treatment Goals addressed: Coping  Interventions: CBT, DBT, Solution Focused, Supportive and Reframing  Summary: Clinician led check-in regarding current stressors and situation, and review of patient completed daily inventory. Clinician utilized active listening and empathetic response and validated patient emotions. Clinician facilitated processing group on pertinent issues.   Therapist Response: Shelby MuirKelli Gallegos is a 38 y.o. female who presents with depression and personality symotoms. Patient arrived within time allowed and reports that she is feeling "optimistic." Patient rates her mood at a 8 on a scale of 1-10 with 10 being great. Pt reports she had a "great" evening. Pt shares her friend's boyfriend moved out so their night went very well. Her friend celebrated pt's birthday with presents and they went to dinner and wandered  around in shops. Pt states they stayed up late talking and it was refreshing and enjoyable. Pt reports not sleeping much but that was due to choice of staying up with friend. Pt reports anxiety about discharge today. Pt able to process. Pt engaged in discussion.        Session Time: 10:00 -11:00  Participation Level:Active  Behavioral Response:CasualAlertDepressed  Type of Therapy: Group Therapy, psychoeducation, psychotherapy  Treatment Goals addressed: Coping  Interventions:CBT, DBT, Solution Focused, Supportive and Reframing  Summary:Cln led discussion on processing "big" events. Group members shared issues they are struggling with processing currently and what is causing them issues. Group provided support and feedback to each other.   Therapist Response: Pt reports struggling to process her suicide attempt. Pt reports finding comfort in the concept that processing does not happen on a time line or in a linear fashion. Pt states she will try to focus on distress tolerance skills for now.         Session Time: 11:00 - 12:00  Participation Level:Active  Behavioral Response:CasualAlertDepressed  Type of Therapy: Group Therapy, OT  Treatment Goals addressed: Coping  Interventions:Psychosocial skills training, Supportive,   Summary:Occupational Therapy group  Therapist Response: Patient engaged in group. See OT note.       Session Time: 12:00- 1:00  Participation Level:Active  Behavioral Response:CasualAlertDepressed  Type of Therapy: Group Therapy, Psychoeducation; Psychotherapy  Treatment Goals addressed: Coping  Interventions:CBT; Solution focused; Supportive; Reframing  Summary:12:00 - 12:50Cln continued topic of boundaries and led review. Cln facilitated boundaries workshop. Group members first discussed boundary issues from examples and then from their own lives and group provided feedback and problem solving  based around effective ways to handle the  boundary issue at hand.  12:50 -1:00 Clinician led check-out. Clinician assessed for immediate needs, medication compliance and efficacy, and safety concerns   Therapist Response: Pt reports demonstrates ability to handle boundaries due to successfully completing examples.  At checkout, pt rates her mood at [facility] a scale of 1-10 with 10 being great. Patient reports afternoon plans of spending the afternoon and evening with her friend. Pt demonstrates some progress as evidenced by improved mood.Patient denies SI/HI/self-harm at the end of group.      Suicidal/Homicidal: Nowithout intent/plan   Plan: Pt will discharge from PHP due to meeting treatment goals of decreased depression symptoms, decreased SI, and increased ability to manage symptoms in a healthy manner as they arise. Pt will step down to IOP within this agency to increase further stabilization with start date of 6/1. Pt and provider are aligned with discharge. Pt denies SI/HI/psychosis at time of discharge.  Diagnosis: Bipolar affective disorder, depressed, severe ([place]) [F31.4]    1. Bipolar affective disorder, depressed, severe ([place])   2. Borderline personality disorder ([place])       Shelby Glass, LCSW 05/12/2019

## 2019-05-12 NOTE — Psych (Signed)
Virtual Visit via Video Note  I connected with Shelby Gallegos on 04/26/19 at  9:00 AM EDT by a video enabled telemedicine application and verified that I am speaking with the correct person using two identifiers.   I discussed the limitations of evaluation and management by telemedicine and the availability of in person appointments. The patient expressed understanding and agreed to proceed.  I discussed the assessment and treatment plan with the patient. The patient was provided an opportunity to ask questions and all were answered. The patient agreed with the plan and demonstrated an understanding of the instructions.   The patient was advised to call back or seek an in-person evaluation if the symptoms worsen or if the condition fails to improve as anticipated.  Pt was provided 240 minutes of non-face-to-face time during this encounter.   Donia GuilesJenny Icelynn Onken, LCSW    [facility] THERAPIST PROGRESS [facility]  Shelby Gallegos [id number]  Session Time: 9:00 - 10:00  Participation Level: Active  Behavioral Response: CasualAlertDepressed  Type of Therapy: Group Therapy  Treatment Goals addressed: Coping  Interventions: CBT, DBT, Solution Focused, Supportive and Reframing  Summary: Clinician led check-in regarding current stressors and situation, and review of patient completed daily inventory. Clinician utilized active listening and empathetic response and validated patient emotions. Clinician facilitated processing group on pertinent issues.   Therapist Response: Shelby MuirKelli Gallegos is a 38 y.o. female who presents with depression and personality symotoms. Patient arrived within time allowed and reports that she is feeling "optimistic." Patient rates her mood at a 8 on a scale of 1-10 with 10 being great. Pt reports she did not get any sleep last night due to ruminating. Pt denies trigger and shares her "head just went there." Pt states having passive suicidal thoughts throughout the night and denies  plan or intent. Pt reports using the coloring app to try to distract herself and it worked at times. Pt shares continued anxiety about staying with her friend tonight and states she did not follow up on any of the other options brainstormed during group. Pt able to process. Pt engaged in discussion.        Session Time: 10:00 -11:00  Participation Level:Active  Behavioral Response:CasualAlertDepressed  Type of Therapy: Group Therapy, psychoeducation, psychotherapy  Treatment Goals addressed: Coping  Interventions:CBT, DBT, Solution Focused, Supportive and Reframing  Summary:Cln led discussion on the purpose of distraction. Cln shared ways to recognize that staying with the rumination/problem is unproductive and how to know when is a more helpful time to come back to the issue at hand.   Therapist Response: Pt reports understanding the concept and struggles with implementation. Pt reports plans to write out distractions on post-it notes around her room to help her remember to use them.        Session Time: 11:00 - 12:00  Participation Level:Active  Behavioral Response:CasualAlertDepressed  Type of Therapy: Group Therapy, OT  Treatment Goals addressed: Coping  Interventions:Psychosocial skills training, Supportive,   Summary:Occupational Therapy group  Therapist Response: Patient engaged in group. See OT note.       Session Time: 12:00- 1:00  Participation Level:Active  Behavioral Response:CasualAlertDepressed  Type of Therapy: Group Therapy, Psychoeducation; Psychotherapy  Treatment Goals addressed: Coping  Interventions:CBT; Solution focused; Supportive; Reframing  Summary:12:00 - 12:50Cln continued topic of boundaries. Cln led review of previously discussed points. Cln provided psychoeducation on how to set a healthy boundary. Group members were active in connecting to past situations in their own lives.  12:50  -1:00 Clinician led  check-out. Clinician assessed for immediate needs, medication compliance and efficacy, and safety concerns   Therapist Response: Pt reports understanding of how to set a healthy boundary. Pt reports most important for her to set healthier time boundaries with herself.  At checkout, pt rates her mood at [facility] a scale of 1-10 with 10 being great. Patient reports afternoon plans of going to PT and to her friends house. Pt demonstrates some progress as evidenced by listing ways she can cope if anxious tonight.Patient denies SI/HI/self-harm at the end of group.      Suicidal/Homicidal: Nowithout intent/plan   Plan: Pt will continue in PHP while working to decrease depression symptoms, decrease SI, and increase ability to manage symptoms in a healthy manner as they arise.   Diagnosis: Bipolar affective disorder, depressed, severe ([place]) [F31.4]    1. Bipolar affective disorder, depressed, severe ([place])   2. Borderline personality disorder ([place])       [name], LCSW 05/12/2019

## 2019-05-14 ENCOUNTER — Other Ambulatory Visit (HOSPITAL_COMMUNITY): Payer: BLUE CROSS/BLUE SHIELD | Admitting: Psychiatry

## 2019-05-14 ENCOUNTER — Other Ambulatory Visit: Payer: Self-pay

## 2019-05-14 DIAGNOSIS — F603 Borderline personality disorder: Secondary | ICD-10-CM

## 2019-05-14 DIAGNOSIS — F314 Bipolar disorder, current episode depressed, severe, without psychotic features: Secondary | ICD-10-CM

## 2019-05-15 ENCOUNTER — Other Ambulatory Visit: Payer: Self-pay

## 2019-05-15 ENCOUNTER — Other Ambulatory Visit (HOSPITAL_COMMUNITY): Payer: BLUE CROSS/BLUE SHIELD | Admitting: Licensed Clinical Social Worker

## 2019-05-15 DIAGNOSIS — Z79899 Other long term (current) drug therapy: Secondary | ICD-10-CM | POA: Diagnosis not present

## 2019-05-15 DIAGNOSIS — F909 Attention-deficit hyperactivity disorder, unspecified type: Secondary | ICD-10-CM | POA: Diagnosis not present

## 2019-05-15 DIAGNOSIS — F314 Bipolar disorder, current episode depressed, severe, without psychotic features: Secondary | ICD-10-CM

## 2019-05-15 DIAGNOSIS — R45851 Suicidal ideations: Secondary | ICD-10-CM | POA: Diagnosis not present

## 2019-05-15 DIAGNOSIS — F419 Anxiety disorder, unspecified: Secondary | ICD-10-CM | POA: Diagnosis not present

## 2019-05-15 NOTE — Progress Notes (Signed)
  Virtual Visit via Video Note  I connected with Nichola Sizer on 05/08/2019 at  9:00 AM EDT by a video enabled telemedicine application and verified that I am speaking with the correct person using two identifiers.   I discussed the limitations of evaluation and management by telemedicine and the availability of in person appointments. The patient expressed understanding and agreed to proceed.  I discussed the assessment and treatment plan with the patient. The patient was provided an opportunity to ask questions and all were answered. The patient agreed with the plan and demonstrated an understanding of the instructions.   The patient was advised to call back or seek an in-person evaluation if the symptoms worsen or if the condition fails to improve as anticipated.  I provided 180 minutes of non-face-to-face time during this encounter.   Olegario Messier, LCSW    Daily Group Progress Note  Program: IOP  Group Time: 9am-12pm  Participation Level: Active  Behavioral Response: Appropriate  Type of Therapy:  Group Therapy; process group, psycho-educational group  Summary of Progress:  The purpose of this group is to utilize CBT and DBT skills in a group setting to increase use of healthy coping skills and decrease frequency and intensity of active mental health symptoms.  9am-10:30am Clinician checked in with group members, assessing for SI/HI/psychosis and overall level of functioning. Clinician provided time for clients to process recent stressors and effect on mental healthy symptoms. Clinician actively listened utilizing validating and summarizing statements. 10:30am-12pm Clinician presented  the topic of Automatic Negative Thoughts. Clinician reviewed the cognitive triangle and discussed how distorted thinking affects emotions and behaviors. Clinician and group members reviewed several types of common distorted thoughts and practiced in session creating alternative healthy thoughts.  Clinician inquired about self care plan. Client engaged in group discussions. Client is able to identify common themes of automatic thoughts and create alternative thoughts. Client verbalized understanding between thoughts, feelings, and behaviors. Client is able to continue utilizing 'brain dump' activity to help separate thoughts and avoid excessive rumination.  Olegario Messier, LCSW

## 2019-05-15 NOTE — Progress Notes (Signed)
Virtual Visit via Video Note  I connected with Shelby Gallegos on 05/15/19 at  9:00 AM EDT by a video enabled telemedicine application and verified that I am speaking with the correct person using two identifiers.  Location: Patient: Shelby Gallegos Provider: Lise Auer, LCSW   I discussed the limitations of evaluation and management by telemedicine and the availability of in person appointments. The patient expressed understanding and agreed to proceed.  History of Present Illness: Bipolar Affective Disorder and Borderline personality Disorder   Observations/Objective: Counselor and case manager checked in with all participants to determine needs and gauge mood. Counselor introduced a new group member to the group and Shelby Gallegos helped her to feel welcome. Counselor introduced a Agricultural consultant, Einar Grad, Pharmacist who shared about Psych Medications and Medication related topics. Shelby Gallegos asked questions and gathered information from the speaker related to her current medications. Counselor allowed group members to process the information from the speaker and share their [place]. Counselor engaged the group in an activity where they did a body scan, documenting any issues within their bodies, as well as writing down specific concerns and questions they have their providers to prepare them for upcoming appointments. Counselor shared how to advocate for themselves when communicating with professionals. Counselor then shared psychoeducation with the group regarding Mental Health Diagnosing and DSM criteria in assessing mental health. Shelby Gallegos participated well in the session, sharing feedback and thoughtful responses.   Assessment and Plan: Counselor recommends that Shelby Gallegos remain in IOP treatment to address mental health symptoms and treatment goals. Counselor recommends that she continue to follow recommendations from medical professionals, apply skills learned in sessions and follow safety/crisis plan.    Follow Up Instructions: Counselor will send Webex link for next session.   I provided 180 minutes of non-face-to-face time during this encounter.   Lise Auer, LCSW

## 2019-05-15 NOTE — Progress Notes (Signed)
Virtual Visit via Video Note  I connected with Shelby Gallegos on 05/11/2019 at  9:00 AM EDT by a video enabled telemedicine application and verified that I am speaking with the correct person using two identifiers.   I discussed the limitations of evaluation and management by telemedicine and the availability of in person appointments. The patient expressed understanding and agreed to proceed.  I discussed the assessment and treatment plan with the patient. The patient was provided an opportunity to ask questions and all were answered. The patient agreed with the plan and demonstrated an understanding of the instructions.   The patient was advised to call back or seek an in-person evaluation if the symptoms worsen or if the condition fails to improve as anticipated.  I provided 180 minutes of non-face-to-face time during this encounter.   Olegario Messier, LCSW     Daily Group Progress Note  Program: IOP  Group Time: 9am-12pm  Participation Level: Active  Behavioral Response: Appropriate  Type of Therapy:  Group Therapy; process group, psycho-educational group  Summary of Progress:  The purpose of this group is to utilize CBT and DBT skills in a process and psycho-educational groups to increase use of healthy coping skills and decrease frequency and intensity of active mental health symptoms.  9am10:30am Clinician checked in with group members, assessing for SI/HI/psychosis and overall level of functioning. Clinician provided time for clients to process recent stressors and the effects on mental health. Clinician and group members utilized Loaded Questions to practice distress tolerance and remaining engaged in discussion even when anxious. Clinician praised client processing and validated feelings. Client topics included anxiety when setting and maintaining boundaries, and self-destructive behaviors. 10:30am-12pm Clinician presented '5 Finger Communication' to address use of healthy,  assertive communication. Clinician and group members reviewed identifying goal of a discussion and utilizing I-statements to address others. Clinician and group members discussed concerns about asking for help and ways to address concerns with assertive statements. Client engaged appropriately in group discussions. Client was able to demonstrate skill appropriately in group.  Olegario Messier, LCSW

## 2019-05-15 NOTE — Progress Notes (Addendum)
Virtual Visit via Video Note  I connected with Shelby Gallegos on 05/15/19 at  9:00 AM EDT by a video enabled telemedicine application and verified that I am speaking with the correct person using two identifiers.   I discussed the limitations of evaluation and management by telemedicine and the availability of in person appointments. The patient expressed understanding and agreed to proceed.  I discussed the assessment and treatment plan with the patient. The patient was provided an opportunity to ask questions and all were answered. The patient agreed with the plan and demonstrated an understanding of the instructions.   The patient was advised to call back or seek an in-person evaluation if the symptoms worsen or if the condition fails to improve as anticipated.  I provided 180 minutes of non-face-to-face time during this encounter.   Olegario Messier, LCSW     Daily Group Progress Note  Program: IOP  Group Time: 9am-12pm  Participation Level: Active  Behavioral Response: Appropriate  Type of Therapy:  Group Therapy; process group, psycho-educational group  Summary of Progress:  The purpose of the group is to utilize CBT and DBT skills in a group setting to increase use of healthy coping skills and decrease frequency and intensity of active mental health symptoms.  9am-10:30am Clinician and group members checked in. Clinician provided clients with opportunity to process recent stressors and effect on mental health symptoms. Clinician inquired about any skills attempted and their effectiveness. Clinician and group members reviewed crisis resources and provided supportive feedback for those previously struggling to seek help despite SI. 10:30am-12pm Clinician presented the skill of Socratic Questioning to challenge ruminating thoughts and excessive worrying. Clinician provided clients with STOPP skill to pause, observe thoughts/feelings, challenge thoughts with facts, and do the next  right thing for them in the moment, which could including a breathing skill or progressive muscle relaxation, meditation, or problem solving. Client checked in, sharing she was able to utilize assertive communication skill to maintain boundary with her mother about independence. Client identified being proud she was able to stay in an uncomfortable situation and have the conversation even though it was uncomfortable and previously she would have walked away. Client engaged in all group discussions, providing supportive feedback to other group members. Client plans to attempt using change in physical temperature skill when noticing she feels anxious.  Olegario Messier, LCSW

## 2019-05-15 NOTE — Progress Notes (Signed)
Virtual Visit via Video Note  I connected with Nichola Sizer on 05/04/2019 at  9:00 AM EDT by a video enabled telemedicine application and verified that I am speaking with the correct person using two identifiers.   I discussed the limitations of evaluation and management by telemedicine and the availability of in person appointments. The patient expressed understanding and agreed to proceed.  I discussed the assessment and treatment plan with the patient. The patient was provided an opportunity to ask questions and all were answered. The patient agreed with the plan and demonstrated an understanding of the instructions.   The patient was advised to call back or seek an in-person evaluation if the symptoms worsen or if the condition fails to improve as anticipated.  I provided 180 minutes of non-face-to-face time during this encounter.   Olegario Messier, LCSW     Daily Group Progress Note  Program: IOP  Group Time: 9am-12pm  Participation Level: Active  Behavioral Response: Appropriate  Type of Therapy:  Group Therapy; process group, psycho-educational group  Summary of Progress:  THe purpose of this group is to utilize CBT and DBT skills in process and psycho-educational groups to increase use of healthy coping skills and decrease frequency and intensity of active mental health symptoms.  9am-10:30am Clinician checked in with group members, assessing for SI/HI/psychosis and overall level of functioning. Clinician provided clients time to process recent stressors and effects on mental health symptoms 10:30am-12pm Clinician and group members reviewed types of boundaries. Clinician and group members discussed ways to identify, implement, and maintain healthy boundaries with self and others. Clinician and group members role played sharing boundaries with others. Client shared boundaries she had with different people in different situations. Client identified factors which had a role in  how she created or maintained boundaries.   Olegario Messier, LCSW

## 2019-05-16 ENCOUNTER — Other Ambulatory Visit (HOSPITAL_COMMUNITY): Payer: BLUE CROSS/BLUE SHIELD

## 2019-05-16 DIAGNOSIS — S66921D Laceration of unspecified muscle, fascia and tendon at wrist and hand level, right hand, subsequent encounter: Secondary | ICD-10-CM | POA: Diagnosis not present

## 2019-05-16 DIAGNOSIS — M25631 Stiffness of right wrist, not elsewhere classified: Secondary | ICD-10-CM | POA: Diagnosis not present

## 2019-05-16 DIAGNOSIS — M25532 Pain in left wrist: Secondary | ICD-10-CM | POA: Diagnosis not present

## 2019-05-16 DIAGNOSIS — S66922D Laceration of unspecified muscle, fascia and tendon at wrist and hand level, left hand, subsequent encounter: Secondary | ICD-10-CM | POA: Diagnosis not present

## 2019-05-16 NOTE — Progress Notes (Unsigned)
  [facility] Discharge Summary  Shelby Gallegos [id number]  Admission date: 04/30/2019 Discharge date: 05/18/2019  Reason for admission: Per assessment note: Per assessment note: Patient is a 38 y.o.Caucasianfemale presents after inpatient hospitalization worsening depression and suicidal attempt .Per assessment note on admission to Inpatient hospitalization 38 year old female. Attempted suicide 3/19 by overdosing on prescribed Alprazolam And on OTC sleeping medication ( Nyquil) . States she took " a whole bottle of Xanax). Following overdose she also cut herself on both forearms resulting in significant blood loss,needing multiple staples. States a friend contacted 911 and was brought to ED. States suicide attempt had been beenformed/planned earlier that day and that at the time " I felt really at peace with it". She cannot identify any specific triggers that may have exacerbated her depression or suicidal ideations. 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  Chemical Use History: Denied  Family of Origin Issues: Claiborne Billings continues to express her family is supportive. Viviana states they do not quite understand mental illness fully during partial hospitalization discharge assessment.  Progress in Program Toward Treatment Goals: Ongoing, patient attended and participated with daily group sessions with active and engaged participation.  She recently completed partial hospitalization programming and is currently finishing up intensive outpatient programming.  Progress (rationale): Keep follow-up and patient to consider trauma based therapy.   Take all medications as prescribed. Keep all follow-up appointments as scheduled.  Do not consume alcohol or use  illegal drugs while on prescription medications. Report any adverse effects from your medications to your primary care provider promptly.  In the event of recurrent symptoms or worsening symptoms, call 911, a crisis hotline, or go to the nearest emergency department for evaluation.   Derrill Center, NP 05/16/2019

## 2019-05-17 ENCOUNTER — Other Ambulatory Visit: Payer: Self-pay

## 2019-05-17 ENCOUNTER — Other Ambulatory Visit (HOSPITAL_COMMUNITY): Payer: BLUE CROSS/BLUE SHIELD | Admitting: Psychiatry

## 2019-05-17 DIAGNOSIS — F314 Bipolar disorder, current episode depressed, severe, without psychotic features: Secondary | ICD-10-CM | POA: Diagnosis not present

## 2019-05-17 DIAGNOSIS — Z79899 Other long term (current) drug therapy: Secondary | ICD-10-CM | POA: Diagnosis not present

## 2019-05-17 DIAGNOSIS — F909 Attention-deficit hyperactivity disorder, unspecified type: Secondary | ICD-10-CM | POA: Diagnosis not present

## 2019-05-17 DIAGNOSIS — F419 Anxiety disorder, unspecified: Secondary | ICD-10-CM | POA: Diagnosis not present

## 2019-05-17 DIAGNOSIS — R4589 Other symptoms and signs involving emotional state: Secondary | ICD-10-CM

## 2019-05-17 DIAGNOSIS — R45851 Suicidal ideations: Secondary | ICD-10-CM | POA: Diagnosis not present

## 2019-05-17 DIAGNOSIS — F603 Borderline personality disorder: Secondary | ICD-10-CM

## 2019-05-17 NOTE — Patient Instructions (Signed)
D:  Patient is scheduled for discharge on 05-21-19.  A:  Discharge on 05-21-19.  Follow up with Dr. Toy Care on 05-23-19 @ 11 a.m and Madalyn Rob, [facility] on 05-25-19 @ 12noon.  R:  Patient receptive.

## 2019-05-17 NOTE — Progress Notes (Signed)
Virtual Visit via Video Note  I connected with Shelby Gallegos on 05/17/19 at  9:00 AM EDT by a video enabled telemedicine application and verified that I am speaking with the correct person using two identifiers.  Location: Patient: Shelby Gallegos Provider: Lise Auer   I discussed the limitations of evaluation and management by telemedicine and the availability of in person appointments. The patient expressed understanding and agreed to proceed.  History of Present Illness: Bipolar Affective Disorder, Borderline personality Disorder and Difficulty coping.    Observations/Objective: Case Manager checked in with all participants to review discharge dates, insurance authorizations, work-related documents and needs for the treatment team. Counselor engaged the group in an therapeutic activity of making Gratitude Statements and shared the benefits of this practice. Areanna shared 3 statements, but seemed a bit uneasy and nervous  in her responses. Counselor introduced guest speaker, Jeanella Craze, Lead [employer], to facilitate a discussion around Grief and Loss topics. During this time, she let me know that she was too anxious today to process her grief and loss verbally with the group. Counselor validated her feelings and she engaged through observation. Counselor prompted the group to journal about the G&L issues they would like to further process with their individual therapist. Counselor provided the group with 25 coping skills in better managing anxiety. Counselor introduced Field seismologist, Jan Fireman, Yoga Instructor, to guide the group in a yoga practice. Counselor checked in with all participants to assess the benefits. Counselor closed by having each share one coping skill they would like to apply over the next week. Charrie stated that she wanted to write a letter to her past self for homework. Despite her anxiety she was able to participate in group today.   Assessment and Plan: Counselor  recommends that [facility] remains in IOP treatment to better manage mental health symptoms and continue to address treatment plan goals. Counselor recommends adherence to crisis/safety plan, taking medications as prescribed and following up with medical professionals if any issues arise.   Follow Up Instructions: Counselor will send Webex link for next session.    I discussed the assessment and treatment plan with the patient. The patient was provided an opportunity to ask questions and all were answered. The patient agreed with the plan and demonstrated an understanding of the instructions.   The patient was advised to call back or seek an in-person evaluation if the symptoms worsen or if the condition fails to improve as anticipated.  I provided 180 minutes of non-face-to-face time during this encounter.   Lise Auer, LCSW

## 2019-05-18 ENCOUNTER — Other Ambulatory Visit (HOSPITAL_COMMUNITY): Payer: BLUE CROSS/BLUE SHIELD | Admitting: Psychiatry

## 2019-05-18 ENCOUNTER — Other Ambulatory Visit: Payer: Self-pay

## 2019-05-18 DIAGNOSIS — F314 Bipolar disorder, current episode depressed, severe, without psychotic features: Secondary | ICD-10-CM

## 2019-05-18 DIAGNOSIS — F603 Borderline personality disorder: Secondary | ICD-10-CM

## 2019-05-21 ENCOUNTER — Other Ambulatory Visit: Payer: Self-pay

## 2019-05-21 ENCOUNTER — Encounter (HOSPITAL_COMMUNITY): Payer: Self-pay | Admitting: Psychiatry

## 2019-05-21 ENCOUNTER — Other Ambulatory Visit (HOSPITAL_COMMUNITY): Payer: BLUE CROSS/BLUE SHIELD | Admitting: Psychiatry

## 2019-05-21 DIAGNOSIS — F603 Borderline personality disorder: Secondary | ICD-10-CM

## 2019-05-21 DIAGNOSIS — R45851 Suicidal ideations: Secondary | ICD-10-CM | POA: Diagnosis not present

## 2019-05-21 DIAGNOSIS — F419 Anxiety disorder, unspecified: Secondary | ICD-10-CM | POA: Diagnosis not present

## 2019-05-21 DIAGNOSIS — F314 Bipolar disorder, current episode depressed, severe, without psychotic features: Secondary | ICD-10-CM

## 2019-05-21 DIAGNOSIS — F909 Attention-deficit hyperactivity disorder, unspecified type: Secondary | ICD-10-CM | POA: Diagnosis not present

## 2019-05-21 DIAGNOSIS — Z79899 Other long term (current) drug therapy: Secondary | ICD-10-CM | POA: Diagnosis not present

## 2019-05-21 NOTE — Progress Notes (Signed)
Virtual Visit via Video Note  I connected with Shelby Gallegos on 05/21/19 at  9:00 AM EDT by a video enabled telemedicine application and verified that I am speaking with the correct person using two identifiers.  Location: Patient: Shelby Gallegos Provider: Lise Auer, LCSW  History of Present Illness: Borderline Personality Disorder and Bipolar Affective DO   Observations/Objective: Case Manager checked in with all participants to review discharge dates, insurance authorizations, work-related documents and needs for the treatment team. Counselor prompted all group members to share how they applied Anxiety Management Skills learned on Friday over the weekend. Each member shared very intentional and inspirational ways they applied the skills and saw positive results. Janitza shared that she had spent time creating goals and thinking about vocational options. Counselor then shared a TedTalk by Almond Lint on Shame and Vulnerability. Counselor allowed space for group members to share their [place]. Caralee shared talking points that were encouraging to her and her progress. Counselor walked the group through creating ECOMAPs about their current relationships and connections. Each group member shared what they created and how it is impacting their mental health and overall wellness. Aymee identified that housing, family and vocation are very important for her to work on to reduce stress levels. Counselor spent the last 10 minutes allowing all to share encouraging words and inspirations gathered from [place] as today she graduates. Aida was touched and all members shared thoughtful good-byes.   Assessment and Plan: Counselor recommends that [facility] complete IOP treatment today and that she start with individual therapy to continue management of mental health symptoms and continue to reevaluate and address treatment plan goals. Counselor recommends adherence to crisis/safety plan, taking medications as  prescribed and following up with medical professionals if any issues arise.   Follow Up Instructions: For [facility] to keep upcoming appointments.     I discussed the assessment and treatment plan with the patient. The patient was provided an opportunity to ask questions and all were answered. The patient agreed with the plan and demonstrated an understanding of the instructions.   The patient was advised to call back or seek an in-person evaluation if the symptoms worsen or if the condition fails to improve as anticipated.  I provided 180 minutes of non-face-to-face time during this encounter.   Lise Auer, LCSW

## 2019-05-21 NOTE — Progress Notes (Signed)
Virtual Visit via Video Note  I connected with Shelby Gallegos on 05/21/19 at 1015 by a video enabled telemedicine application and verified that I am speaking with the correct person using two identifiers.  I discussed the limitations of evaluation and management by telemedicine and the availability of in person appointments. The patient expressed understanding and agreed to proceed.  I discussed the assessment and treatment plan with the patient. The patient was provided an opportunity to ask questions and all were answered. The patient agreed with the plan and demonstrated an understanding of the instructions. The patient was advised to call back or seek an in-person evaluation if the symptoms worsen or if the condition fails to improve as anticipated. I provided 20 minutes of non-face-to-face time during this encounter.    This is a 38 yr old, single, employed, Caucasian female, who transitioned from [facility].  As per previous [facility] note states:   Pt reports to [facility] per inpt f/u 1.5 months ago. Pt was inpt for suicide attempt by overdose and cutting. Pt reports attempt was impulsive; "I woke up that morning and though 'today is the day I'm going to die.'" Pt reports she does not have intent/plan at this time. Denies any prior suicide attempts/gestures. Pt states she questions why her attempt was unsuccessful. Pt is staying with parents for support and safety. Pt reports two prior psychiatric hospitalizations: [facility] (2018) and [facility] [phone]). Pt reports seeing Dr. Toy Care (since 2000) and Madalyn Rob, [facility] (since 2016) on an outpt basis. Pt reports continued struggles with depression, anxiety, isolation, and racing thoughts. Pt reports hx of self-harm by punching walls until knuckles bled. 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 Pt denies HI/AVH. Pt is well known to this Probation officer d/t previous MH-IOP admits.  CC: prior notes for hx. Discussed aftercare  plans with patient.  Pt states she will be meeting with the manager at [place] before returning there to discuss groups or volunteer options.  Although pt denies SI today; discussed safety options with pt at length.  Pt denies HI or A/V hallucinations.    Pt completed MH-IOP today.  Reports overall mood being improved.  Attended all groups, except one day.  Has been applying all skills.  States that the groups were very helpful.  Reports feeling very anxious last Thursday and Friday, but attributes it to a larger group and upcoming discharge.  "You know I don't do well with change.  I am feeling much better today though."  Pt mentioned she has an upcoming neurologist appointment.  She's worried that the doctor may change her medicines around.  Encouraged pt to practice her assertiveness skills and mention how much better she's been doing with current regimen.  Pt denies SI/HI and A/V hallucinations.  [place] nephew's birthday. A:  D/C pt today.Encouraged online support groups thru [facility].  F/U with Dr. Toy Care on 05-23-19 and Madalyn Rob, [facility] on 05-25-19.  R:  Pt receptive.    Carlis Abbott, RITA, M.Ed, CNA

## 2019-05-22 ENCOUNTER — Other Ambulatory Visit (HOSPITAL_COMMUNITY): Payer: BLUE CROSS/BLUE SHIELD | Admitting: Psychiatry

## 2019-05-22 ENCOUNTER — Other Ambulatory Visit: Payer: Self-pay

## 2019-05-23 ENCOUNTER — Ambulatory Visit (HOSPITAL_COMMUNITY): Payer: BLUE CROSS/BLUE SHIELD

## 2019-05-23 DIAGNOSIS — M25631 Stiffness of right wrist, not elsewhere classified: Secondary | ICD-10-CM | POA: Diagnosis not present

## 2019-05-23 DIAGNOSIS — S66922D Laceration of unspecified muscle, fascia and tendon at wrist and hand level, left hand, subsequent encounter: Secondary | ICD-10-CM | POA: Diagnosis not present

## 2019-05-23 DIAGNOSIS — M25532 Pain in left wrist: Secondary | ICD-10-CM | POA: Diagnosis not present

## 2019-05-23 DIAGNOSIS — S66921D Laceration of unspecified muscle, fascia and tendon at wrist and hand level, right hand, subsequent encounter: Secondary | ICD-10-CM | POA: Diagnosis not present

## 2019-05-24 ENCOUNTER — Ambulatory Visit (HOSPITAL_COMMUNITY): Payer: BLUE CROSS/BLUE SHIELD

## 2019-05-24 DIAGNOSIS — G43709 Chronic migraine without aura, not intractable, without status migrainosus: Secondary | ICD-10-CM | POA: Diagnosis not present

## 2019-05-25 ENCOUNTER — Ambulatory Visit (HOSPITAL_COMMUNITY): Payer: BLUE CROSS/BLUE SHIELD

## 2019-05-28 ENCOUNTER — Ambulatory Visit (HOSPITAL_COMMUNITY): Payer: BLUE CROSS/BLUE SHIELD

## 2019-05-29 ENCOUNTER — Ambulatory Visit (HOSPITAL_COMMUNITY): Payer: BLUE CROSS/BLUE SHIELD

## 2019-05-30 ENCOUNTER — Ambulatory Visit (HOSPITAL_COMMUNITY): Payer: BLUE CROSS/BLUE SHIELD

## 2019-05-31 ENCOUNTER — Ambulatory Visit (HOSPITAL_COMMUNITY): Payer: BLUE CROSS/BLUE SHIELD

## 2019-06-04 DIAGNOSIS — S66922D Laceration of unspecified muscle, fascia and tendon at wrist and hand level, left hand, subsequent encounter: Secondary | ICD-10-CM | POA: Diagnosis not present

## 2019-06-04 DIAGNOSIS — M25631 Stiffness of right wrist, not elsewhere classified: Secondary | ICD-10-CM | POA: Diagnosis not present

## 2019-06-04 DIAGNOSIS — M25632 Stiffness of left wrist, not elsewhere classified: Secondary | ICD-10-CM | POA: Diagnosis not present

## 2019-06-04 DIAGNOSIS — S66921D Laceration of unspecified muscle, fascia and tendon at wrist and hand level, right hand, subsequent encounter: Secondary | ICD-10-CM | POA: Diagnosis not present

## 2019-06-14 DIAGNOSIS — S66922D Laceration of unspecified muscle, fascia and tendon at wrist and hand level, left hand, subsequent encounter: Secondary | ICD-10-CM | POA: Diagnosis not present

## 2019-06-14 DIAGNOSIS — M25631 Stiffness of right wrist, not elsewhere classified: Secondary | ICD-10-CM | POA: Diagnosis not present

## 2019-06-14 DIAGNOSIS — S66921D Laceration of unspecified muscle, fascia and tendon at wrist and hand level, right hand, subsequent encounter: Secondary | ICD-10-CM | POA: Diagnosis not present

## 2019-06-14 DIAGNOSIS — M25632 Stiffness of left wrist, not elsewhere classified: Secondary | ICD-10-CM | POA: Diagnosis not present

## 2019-06-19 DIAGNOSIS — F319 Bipolar disorder, unspecified: Secondary | ICD-10-CM | POA: Diagnosis not present

## 2019-06-28 DIAGNOSIS — M25631 Stiffness of right wrist, not elsewhere classified: Secondary | ICD-10-CM | POA: Diagnosis not present

## 2019-06-28 DIAGNOSIS — S66921D Laceration of unspecified muscle, fascia and tendon at wrist and hand level, right hand, subsequent encounter: Secondary | ICD-10-CM | POA: Diagnosis not present

## 2019-06-28 DIAGNOSIS — M25532 Pain in left wrist: Secondary | ICD-10-CM | POA: Diagnosis not present

## 2019-06-28 DIAGNOSIS — S66922D Laceration of unspecified muscle, fascia and tendon at wrist and hand level, left hand, subsequent encounter: Secondary | ICD-10-CM | POA: Diagnosis not present

## 2019-07-04 DIAGNOSIS — S66921D Laceration of unspecified muscle, fascia and tendon at wrist and hand level, right hand, subsequent encounter: Secondary | ICD-10-CM | POA: Diagnosis not present

## 2019-07-04 DIAGNOSIS — M25531 Pain in right wrist: Secondary | ICD-10-CM | POA: Diagnosis not present

## 2019-07-04 DIAGNOSIS — S66922D Laceration of unspecified muscle, fascia and tendon at wrist and hand level, left hand, subsequent encounter: Secondary | ICD-10-CM | POA: Diagnosis not present

## 2019-07-04 DIAGNOSIS — M25631 Stiffness of right wrist, not elsewhere classified: Secondary | ICD-10-CM | POA: Diagnosis not present

## 2019-07-12 DIAGNOSIS — F319 Bipolar disorder, unspecified: Secondary | ICD-10-CM | POA: Diagnosis not present

## 2019-07-19 DIAGNOSIS — M25531 Pain in right wrist: Secondary | ICD-10-CM | POA: Diagnosis not present

## 2019-07-19 DIAGNOSIS — S66922D Laceration of unspecified muscle, fascia and tendon at wrist and hand level, left hand, subsequent encounter: Secondary | ICD-10-CM | POA: Diagnosis not present

## 2019-07-19 DIAGNOSIS — S66921D Laceration of unspecified muscle, fascia and tendon at wrist and hand level, right hand, subsequent encounter: Secondary | ICD-10-CM | POA: Diagnosis not present

## 2019-07-19 DIAGNOSIS — M25631 Stiffness of right wrist, not elsewhere classified: Secondary | ICD-10-CM | POA: Diagnosis not present

## 2019-07-26 DIAGNOSIS — F319 Bipolar disorder, unspecified: Secondary | ICD-10-CM | POA: Diagnosis not present

## 2019-07-31 DIAGNOSIS — F319 Bipolar disorder, unspecified: Secondary | ICD-10-CM | POA: Diagnosis not present

## 2019-08-02 DIAGNOSIS — S66922D Laceration of unspecified muscle, fascia and tendon at wrist and hand level, left hand, subsequent encounter: Secondary | ICD-10-CM | POA: Diagnosis not present

## 2019-08-02 DIAGNOSIS — S66921D Laceration of unspecified muscle, fascia and tendon at wrist and hand level, right hand, subsequent encounter: Secondary | ICD-10-CM | POA: Diagnosis not present

## 2019-08-02 DIAGNOSIS — M25631 Stiffness of right wrist, not elsewhere classified: Secondary | ICD-10-CM | POA: Diagnosis not present

## 2019-08-02 DIAGNOSIS — M25531 Pain in right wrist: Secondary | ICD-10-CM | POA: Diagnosis not present

## 2019-08-07 DIAGNOSIS — M25631 Stiffness of right wrist, not elsewhere classified: Secondary | ICD-10-CM | POA: Diagnosis not present

## 2019-08-07 DIAGNOSIS — S66922D Laceration of unspecified muscle, fascia and tendon at wrist and hand level, left hand, subsequent encounter: Secondary | ICD-10-CM | POA: Diagnosis not present

## 2019-08-07 DIAGNOSIS — M25531 Pain in right wrist: Secondary | ICD-10-CM | POA: Diagnosis not present

## 2019-08-07 DIAGNOSIS — S66921D Laceration of unspecified muscle, fascia and tendon at wrist and hand level, right hand, subsequent encounter: Secondary | ICD-10-CM | POA: Diagnosis not present

## 2019-08-09 DIAGNOSIS — F310 Bipolar disorder, current episode hypomanic: Secondary | ICD-10-CM | POA: Diagnosis not present

## 2019-08-16 DIAGNOSIS — F3112 Bipolar disorder, current episode manic without psychotic features, moderate: Secondary | ICD-10-CM | POA: Diagnosis not present

## 2019-08-16 DIAGNOSIS — S66922D Laceration of unspecified muscle, fascia and tendon at wrist and hand level, left hand, subsequent encounter: Secondary | ICD-10-CM | POA: Diagnosis not present

## 2019-08-16 DIAGNOSIS — S66921D Laceration of unspecified muscle, fascia and tendon at wrist and hand level, right hand, subsequent encounter: Secondary | ICD-10-CM | POA: Diagnosis not present

## 2019-08-16 DIAGNOSIS — M25532 Pain in left wrist: Secondary | ICD-10-CM | POA: Diagnosis not present

## 2019-08-16 DIAGNOSIS — M25531 Pain in right wrist: Secondary | ICD-10-CM | POA: Diagnosis not present

## 2019-08-22 DIAGNOSIS — M25532 Pain in left wrist: Secondary | ICD-10-CM | POA: Diagnosis not present

## 2019-08-22 DIAGNOSIS — S66922D Laceration of unspecified muscle, fascia and tendon at wrist and hand level, left hand, subsequent encounter: Secondary | ICD-10-CM | POA: Diagnosis not present

## 2019-08-22 DIAGNOSIS — M25531 Pain in right wrist: Secondary | ICD-10-CM | POA: Diagnosis not present

## 2019-08-22 DIAGNOSIS — S66921D Laceration of unspecified muscle, fascia and tendon at wrist and hand level, right hand, subsequent encounter: Secondary | ICD-10-CM | POA: Diagnosis not present

## 2019-08-23 DIAGNOSIS — F3112 Bipolar disorder, current episode manic without psychotic features, moderate: Secondary | ICD-10-CM | POA: Diagnosis not present

## 2019-08-30 DIAGNOSIS — F3112 Bipolar disorder, current episode manic without psychotic features, moderate: Secondary | ICD-10-CM | POA: Diagnosis not present

## 2019-09-06 DIAGNOSIS — M25532 Pain in left wrist: Secondary | ICD-10-CM | POA: Diagnosis not present

## 2019-09-06 DIAGNOSIS — S66921D Laceration of unspecified muscle, fascia and tendon at wrist and hand level, right hand, subsequent encounter: Secondary | ICD-10-CM | POA: Diagnosis not present

## 2019-09-06 DIAGNOSIS — M25631 Stiffness of right wrist, not elsewhere classified: Secondary | ICD-10-CM | POA: Diagnosis not present

## 2019-09-06 DIAGNOSIS — S66922D Laceration of unspecified muscle, fascia and tendon at wrist and hand level, left hand, subsequent encounter: Secondary | ICD-10-CM | POA: Diagnosis not present

## 2019-09-10 DIAGNOSIS — F4312 Post-traumatic stress disorder, chronic: Secondary | ICD-10-CM | POA: Diagnosis not present

## 2019-09-18 DIAGNOSIS — F4312 Post-traumatic stress disorder, chronic: Secondary | ICD-10-CM | POA: Diagnosis not present

## 2019-09-25 DIAGNOSIS — F4312 Post-traumatic stress disorder, chronic: Secondary | ICD-10-CM | POA: Diagnosis not present

## 2019-10-02 DIAGNOSIS — F4312 Post-traumatic stress disorder, chronic: Secondary | ICD-10-CM | POA: Diagnosis not present

## 2019-10-09 DIAGNOSIS — F4312 Post-traumatic stress disorder, chronic: Secondary | ICD-10-CM | POA: Diagnosis not present

## 2019-10-11 DIAGNOSIS — S66921D Laceration of unspecified muscle, fascia and tendon at wrist and hand level, right hand, subsequent encounter: Secondary | ICD-10-CM | POA: Diagnosis not present

## 2019-10-11 DIAGNOSIS — S66922D Laceration of unspecified muscle, fascia and tendon at wrist and hand level, left hand, subsequent encounter: Secondary | ICD-10-CM | POA: Diagnosis not present

## 2019-10-11 DIAGNOSIS — M25631 Stiffness of right wrist, not elsewhere classified: Secondary | ICD-10-CM | POA: Diagnosis not present

## 2019-10-11 DIAGNOSIS — S61502D Unspecified open wound of left wrist, subsequent encounter: Secondary | ICD-10-CM | POA: Diagnosis not present

## 2019-10-16 DIAGNOSIS — F4312 Post-traumatic stress disorder, chronic: Secondary | ICD-10-CM | POA: Diagnosis not present

## 2019-10-17 DIAGNOSIS — M25631 Stiffness of right wrist, not elsewhere classified: Secondary | ICD-10-CM | POA: Diagnosis not present

## 2019-10-17 DIAGNOSIS — M25532 Pain in left wrist: Secondary | ICD-10-CM | POA: Diagnosis not present

## 2019-10-17 DIAGNOSIS — M25531 Pain in right wrist: Secondary | ICD-10-CM | POA: Diagnosis not present

## 2019-10-17 DIAGNOSIS — S66922D Laceration of unspecified muscle, fascia and tendon at wrist and hand level, left hand, subsequent encounter: Secondary | ICD-10-CM | POA: Diagnosis not present

## 2019-10-23 DIAGNOSIS — F4312 Post-traumatic stress disorder, chronic: Secondary | ICD-10-CM | POA: Diagnosis not present

## 2019-11-06 DIAGNOSIS — F4312 Post-traumatic stress disorder, chronic: Secondary | ICD-10-CM | POA: Diagnosis not present

## 2019-11-13 DIAGNOSIS — F4312 Post-traumatic stress disorder, chronic: Secondary | ICD-10-CM | POA: Diagnosis not present

## 2019-11-19 DIAGNOSIS — F4312 Post-traumatic stress disorder, chronic: Secondary | ICD-10-CM | POA: Diagnosis not present

## 2019-11-26 DIAGNOSIS — F4312 Post-traumatic stress disorder, chronic: Secondary | ICD-10-CM | POA: Diagnosis not present

## 2019-11-29 DIAGNOSIS — F4312 Post-traumatic stress disorder, chronic: Secondary | ICD-10-CM | POA: Diagnosis not present

## 2019-12-04 DIAGNOSIS — F4312 Post-traumatic stress disorder, chronic: Secondary | ICD-10-CM | POA: Diagnosis not present

## 2019-12-06 DIAGNOSIS — F4312 Post-traumatic stress disorder, chronic: Secondary | ICD-10-CM | POA: Diagnosis not present

## 2019-12-11 DIAGNOSIS — F4312 Post-traumatic stress disorder, chronic: Secondary | ICD-10-CM | POA: Diagnosis not present

## 2019-12-16 DIAGNOSIS — F4312 Post-traumatic stress disorder, chronic: Secondary | ICD-10-CM | POA: Diagnosis not present

## 2019-12-18 DIAGNOSIS — F4312 Post-traumatic stress disorder, chronic: Secondary | ICD-10-CM | POA: Diagnosis not present

## 2019-12-18 DIAGNOSIS — F900 Attention-deficit hyperactivity disorder, predominantly inattentive type: Secondary | ICD-10-CM | POA: Diagnosis not present

## 2019-12-18 DIAGNOSIS — F3174 Bipolar disorder, in full remission, most recent episode manic: Secondary | ICD-10-CM | POA: Diagnosis not present

## 2019-12-18 DIAGNOSIS — F3131 Bipolar disorder, current episode depressed, mild: Secondary | ICD-10-CM | POA: Diagnosis not present

## 2019-12-25 DIAGNOSIS — F4312 Post-traumatic stress disorder, chronic: Secondary | ICD-10-CM | POA: Diagnosis not present

## 2020-01-09 DIAGNOSIS — F4312 Post-traumatic stress disorder, chronic: Secondary | ICD-10-CM | POA: Diagnosis not present

## 2020-01-15 DIAGNOSIS — F4312 Post-traumatic stress disorder, chronic: Secondary | ICD-10-CM | POA: Diagnosis not present

## 2020-01-17 DIAGNOSIS — F4312 Post-traumatic stress disorder, chronic: Secondary | ICD-10-CM | POA: Diagnosis not present

## 2020-01-22 DIAGNOSIS — F4312 Post-traumatic stress disorder, chronic: Secondary | ICD-10-CM | POA: Diagnosis not present

## 2020-01-29 DIAGNOSIS — F4312 Post-traumatic stress disorder, chronic: Secondary | ICD-10-CM | POA: Diagnosis not present

## 2020-02-05 DIAGNOSIS — F4312 Post-traumatic stress disorder, chronic: Secondary | ICD-10-CM | POA: Diagnosis not present

## 2020-02-15 DIAGNOSIS — F4312 Post-traumatic stress disorder, chronic: Secondary | ICD-10-CM | POA: Diagnosis not present

## 2020-02-19 DIAGNOSIS — F4312 Post-traumatic stress disorder, chronic: Secondary | ICD-10-CM | POA: Diagnosis not present

## 2020-02-26 DIAGNOSIS — F4312 Post-traumatic stress disorder, chronic: Secondary | ICD-10-CM | POA: Diagnosis not present

## 2020-03-04 DIAGNOSIS — F4312 Post-traumatic stress disorder, chronic: Secondary | ICD-10-CM | POA: Diagnosis not present

## 2020-03-11 DIAGNOSIS — F4312 Post-traumatic stress disorder, chronic: Secondary | ICD-10-CM | POA: Diagnosis not present

## 2020-03-18 DIAGNOSIS — F4312 Post-traumatic stress disorder, chronic: Secondary | ICD-10-CM | POA: Diagnosis not present

## 2020-03-20 DIAGNOSIS — F4312 Post-traumatic stress disorder, chronic: Secondary | ICD-10-CM | POA: Diagnosis not present

## 2020-03-25 DIAGNOSIS — F4312 Post-traumatic stress disorder, chronic: Secondary | ICD-10-CM | POA: Diagnosis not present

## 2020-03-31 DIAGNOSIS — F4312 Post-traumatic stress disorder, chronic: Secondary | ICD-10-CM | POA: Diagnosis not present

## 2020-04-03 DIAGNOSIS — F4312 Post-traumatic stress disorder, chronic: Secondary | ICD-10-CM | POA: Diagnosis not present

## 2020-04-08 DIAGNOSIS — F4312 Post-traumatic stress disorder, chronic: Secondary | ICD-10-CM | POA: Diagnosis not present

## 2020-04-10 DIAGNOSIS — F4312 Post-traumatic stress disorder, chronic: Secondary | ICD-10-CM | POA: Diagnosis not present

## 2020-04-15 DIAGNOSIS — F4312 Post-traumatic stress disorder, chronic: Secondary | ICD-10-CM | POA: Diagnosis not present

## 2020-04-22 DIAGNOSIS — F4312 Post-traumatic stress disorder, chronic: Secondary | ICD-10-CM | POA: Diagnosis not present

## 2020-05-01 DIAGNOSIS — F4312 Post-traumatic stress disorder, chronic: Secondary | ICD-10-CM | POA: Diagnosis not present

## 2020-05-06 DIAGNOSIS — F4312 Post-traumatic stress disorder, chronic: Secondary | ICD-10-CM | POA: Diagnosis not present

## 2020-05-12 DIAGNOSIS — F3131 Bipolar disorder, current episode depressed, mild: Secondary | ICD-10-CM | POA: Diagnosis not present

## 2020-05-12 DIAGNOSIS — F3174 Bipolar disorder, in full remission, most recent episode manic: Secondary | ICD-10-CM | POA: Diagnosis not present

## 2020-05-12 DIAGNOSIS — F410 Panic disorder [episodic paroxysmal anxiety] without agoraphobia: Secondary | ICD-10-CM | POA: Diagnosis not present

## 2020-05-12 DIAGNOSIS — F900 Attention-deficit hyperactivity disorder, predominantly inattentive type: Secondary | ICD-10-CM | POA: Diagnosis not present

## 2020-05-13 ENCOUNTER — Other Ambulatory Visit
Admission: RE | Admit: 2020-05-13 | Discharge: 2020-05-13 | Disposition: A | Discharge status: Home / Self Care | Payer: BLUE CROSS/BLUE SHIELD | Attending: Specialist | Admitting: Specialist

## 2020-05-13 ENCOUNTER — Other Ambulatory Visit: Payer: Self-pay

## 2020-05-13 DIAGNOSIS — E559 Vitamin D deficiency, unspecified: Secondary | ICD-10-CM | POA: Insufficient documentation

## 2020-05-13 DIAGNOSIS — D529 Folate deficiency anemia, unspecified: Secondary | ICD-10-CM | POA: Insufficient documentation

## 2020-05-13 DIAGNOSIS — Z1329 Encounter for screening for other suspected endocrine disorder: Secondary | ICD-10-CM | POA: Insufficient documentation

## 2020-05-13 DIAGNOSIS — E785 Hyperlipidemia, unspecified: Secondary | ICD-10-CM | POA: Insufficient documentation

## 2020-05-13 DIAGNOSIS — D519 Vitamin B12 deficiency anemia, unspecified: Secondary | ICD-10-CM | POA: Diagnosis not present

## 2020-05-13 DIAGNOSIS — Z79899 Other long term (current) drug therapy: Secondary | ICD-10-CM | POA: Insufficient documentation

## 2020-05-13 DIAGNOSIS — F4312 Post-traumatic stress disorder, chronic: Secondary | ICD-10-CM | POA: Diagnosis not present

## 2020-05-13 DIAGNOSIS — Z130 Encounter for screening for diseases of the blood and blood-forming organs and certain disorders involving the immune mechanism: Secondary | ICD-10-CM | POA: Insufficient documentation

## 2020-05-13 LAB — TSH: TSH: 5.152 u[IU]/mL — ABNORMAL HIGH (ref 0.350–4.500)

## 2020-05-13 LAB — CBC
HCT: 38.4 % (ref 36.0–46.0)
Hemoglobin: 12.3 g/dL (ref 12.0–15.0)
MCH: 29.2 pg (ref 26.0–34.0)
MCHC: 32 g/dL (ref 30.0–36.0)
MCV: 91.2 fL (ref 80.0–100.0)
Platelets: 340 10*3/uL (ref 150–400)
RBC: 4.21 MIL/uL (ref 3.87–5.11)
RDW: 13.3 % (ref 11.5–15.5)
WBC: 6.2 10*3/uL (ref 4.0–10.5)
nRBC: 0 % (ref 0.0–0.2)

## 2020-05-13 LAB — COMPREHENSIVE METABOLIC PANEL
ALT: 17 U/L (ref 0–44)
AST: 15 U/L (ref 15–41)
Albumin: 3.6 g/dL (ref 3.5–5.0)
Alkaline Phosphatase: 77 U/L (ref 38–126)
Anion gap: 11 (ref 5–15)
BUN: 8 mg/dL (ref 6–20)
CO2: 25 mmol/L (ref 22–32)
Calcium: 8.3 mg/dL — ABNORMAL LOW (ref 8.9–10.3)
Chloride: 103 mmol/L (ref 98–111)
Creatinine, Ser: 1.07 mg/dL — ABNORMAL HIGH (ref 0.44–1.00)
GFR calc Af Amer: >60 mL/min (ref >60)
GFR calc non Af Amer: >60 mL/min (ref >60)
Glucose, Bld: 104 mg/dL — ABNORMAL HIGH (ref 70–99)
Potassium: 3.3 mmol/L — ABNORMAL LOW (ref 3.5–5.1)
Sodium: 139 mmol/L (ref 135–145)
Total Bilirubin: 0.4 mg/dL (ref 0.3–1.2)
Total Protein: 6.9 g/dL (ref 6.5–8.1)

## 2020-05-13 LAB — LIPID PANEL
Cholesterol: 220 mg/dL — ABNORMAL HIGH (ref 0–200)
HDL: 58 mg/dL (ref >40)
LDL Cholesterol: 146 mg/dL — ABNORMAL HIGH (ref 0–99)
Total CHOL/HDL Ratio: 3.8 RATIO
Triglycerides: 78 mg/dL (ref <150)
VLDL: 16 mg/dL (ref 0–40)

## 2020-05-13 LAB — VITAMIN B12: Vitamin B-12: 149 pg/mL — ABNORMAL LOW (ref 180–914)

## 2020-05-13 LAB — FOLATE: Folate: 10.1 ng/mL (ref >5.9)

## 2020-05-13 LAB — VITAMIN D 25 HYDROXY (VIT D DEFICIENCY, FRACTURES): Vit D, 25-Hydroxy: 18.12 ng/mL — ABNORMAL LOW (ref 30–100)

## 2020-05-20 DIAGNOSIS — F4312 Post-traumatic stress disorder, chronic: Secondary | ICD-10-CM | POA: Diagnosis not present

## 2020-05-27 DIAGNOSIS — F4312 Post-traumatic stress disorder, chronic: Secondary | ICD-10-CM | POA: Diagnosis not present

## 2020-06-03 DIAGNOSIS — F4312 Post-traumatic stress disorder, chronic: Secondary | ICD-10-CM | POA: Diagnosis not present

## 2020-06-05 NOTE — Progress Notes (Signed)
Established patient visit  I,Shelby Gallegos,acting as a scribe for Megan Mans, MD.,have documented all relevant documentation on the behalf of Megan Mans, MD,as directed by  Megan Mans, MD while in the presence of Megan Mans, MD.   Patient: Shelby Gallegos   DOB: 1981/10/10   39 y.o. Female  MRN: [id number] Visit Date: 06/09/2020  Today's healthcare provider: Megan Mans, MD   Chief Complaint  Patient presents with  . Other   Subjective    HPI HPI    Abnormal Labs    Last edited by Marlene Lard, CMA on 06/09/2020  8:13 AM. (History)    Patient has not been in since 2016.  She unfortunately had a bipolar psychotic break a year ago and ended up attempting suicide.  She has been doing well since then.  He has been followed by Dr. Evelene Croon from psychiatry for more than 20 years.  She has not had a primary care since she was here. She had mildly abnormal labs about the car which included low B12, low vitamin D, mildly worsened hypothyroidism. She has gained weight and is exercising and lifestyle changes to lose weight over the next few months.  She is going to [employer] with her family in December and this is her goal. Patient had labs done on 05/13/2020 ordered by Dr. Milagros Evener from [facility] Patient wanted to discuss abnormal labs.   Patient Active Problem List   Diagnosis Date Noted  . Toxic encephalopathy 02/16/2019  . Suicide attempt (HCC)   . Overdose 02/15/2019  . Suicidal behavior with attempted self-injury (HCC) 02/15/2019  . AKI (acute kidney injury) (HCC) 02/15/2019  . Leukocytosis 02/15/2019  . Laceration of multiple sites of skin 02/15/2019  . Intentional acetaminophen overdose (HCC)   . History of suicide attempt 04/01/2017  . Borderline personality disorder (HCC) 02/16/2017  . Bipolar affective disorder, depressed, severe (HCC) 08/21/2015  . ADHD (attention deficit hyperactivity disorder) 08/21/2015    . Headache, migraine 09/28/2014  . Abnormal ECG 11/26/2009  . Essential (primary) hypertension 11/26/2009   Social History   Tobacco Use  . Smoking status: Never Smoker  . Smokeless tobacco: Never Used  Vaping Use  . Vaping Use: Never used  Substance Use Topics  . Alcohol use: No  . Drug use: No       Medications: Outpatient Medications Prior to Visit  Medication Sig  . amphetamine-dextroamphetamine (ADDERALL) 20 MG tablet Take 20 mg by mouth 3 (three) times daily.  Dorise Hiss (AIMOVIG, 140 MG DOSE,) 70 MG/ML SOAJ Inject 70 mg into the skin every 30 (thirty) days.  Marland Kitchen gabapentin (NEURONTIN) 400 MG capsule Take 400 mg by mouth 4 (four) times daily.  Marland Kitchen lamoTRIgine (LAMICTAL) 150 MG tablet Take 150 mg by mouth 2 (two) times daily.  Marland Kitchen levothyroxine (SYNTHROID) 50 MCG tablet Take 50 mcg by mouth daily.  . OXcarbazepine (TRILEPTAL) 150 MG tablet Take 1 tablet (150 mg total) by mouth at bedtime.  . prazosin (MINIPRESS) 2 MG capsule Take 2-4 mg by mouth at bedtime.  . SUMAtriptan (IMITREX) 6 MG/0.5ML SOLN injection Inject 6 mg into the skin daily as needed.  . topiramate (TOPAMAX) 100 MG tablet   . VRAYLAR capsule Take 1.5 mg by mouth daily.  . Lurasidone HCl 60 MG TABS Take 1 tablet (60 mg total) by mouth daily with breakfast. (Patient not taking: Reported on 06/09/2020)   No facility-administered medications prior to visit.  Review of Systems  Constitutional: Negative for appetite change, chills, fatigue and fever.  HENT: Negative.   Eyes: Negative.   Respiratory: Negative for chest tightness and shortness of breath.   Cardiovascular: Negative for chest pain and palpitations.  Gastrointestinal: Negative for abdominal pain, nausea and vomiting.  Endocrine: Negative.   Musculoskeletal: Negative.   Skin: Negative.   Allergic/Immunologic: Negative.   Neurological: Negative for dizziness and weakness.  Psychiatric/Behavioral:       See HPI.    Last CBC Lab Results   Component Value Date   WBC 6.2 05/13/2020   HGB 12.3 05/13/2020   HCT 38.4 05/13/2020   MCV 91.2 05/13/2020   MCH 29.2 05/13/2020   RDW 13.3 05/13/2020   PLT 340 05/13/2020   Last metabolic panel Lab Results  Component Value Date   GLUCOSE 104 (H) 05/13/2020   NA 139 05/13/2020   K 3.3 (L) 05/13/2020   CL 103 05/13/2020   CO2 25 05/13/2020   BUN 8 05/13/2020   CREATININE 1.07 (H) 05/13/2020   GFRNONAA >60 05/13/2020   GFRAA >60 05/13/2020   CALCIUM 8.3 (L) 05/13/2020   PHOS 3.4 05/25/2017   PROT 6.9 05/13/2020   ALBUMIN 3.6 05/13/2020   LABGLOB 2.4 05/25/2017   AGRATIO 1.7 05/25/2017   BILITOT 0.4 05/13/2020   ALKPHOS 77 05/13/2020   AST 15 05/13/2020   ALT 17 05/13/2020   ANIONGAP 11 05/13/2020   Last lipids Lab Results  Component Value Date   CHOL 220 (H) 05/13/2020   HDL 58 05/13/2020   LDLCALC 146 (H) 05/13/2020   TRIG 78 05/13/2020   CHOLHDL 3.8 05/13/2020   Last hemoglobin A1c Lab Results  Component Value Date   HGBA1C 5.6 02/19/2019   Last thyroid functions Lab Results  Component Value Date   TSH 5.152 (H) 05/13/2020   T4TOTAL 8.1 05/25/2017   Last vitamin D Lab Results  Component Value Date   VD25OH 18.12 (L) 05/13/2020   Last vitamin B12 and Folate Lab Results  Component Value Date   VITAMINB12 149 (L) 05/13/2020   FOLATE 10.1 05/13/2020      Objective    BP (!) 158/77 (BP Location: Left Arm, Patient Position: Sitting, Cuff Size: Large)   Pulse (!) 106   Temp (!) 96.6 F (35.9 C) (Other (Comment))   Resp 16   Ht 5\' 7"  (1.702 m)   Wt 235 lb (106.6 kg)   SpO2 98%   BMI 36.81 kg/m  BP Readings from Last 3 Encounters:  06/09/20 (!) 158/77  02/17/19 109/71  05/25/17 110/70   Wt Readings from Last 3 Encounters:  06/09/20 235 lb (106.6 kg)  02/15/19 213 lb (96.6 kg)  05/25/17 171 lb (77.6 kg)      Physical Exam Vitals and nursing note reviewed.  Constitutional:      Appearance: Normal appearance. She is normal weight.   HENT:     Right Ear: External ear normal.     Left Ear: External ear normal.  Eyes:     General: No scleral icterus.    Conjunctiva/sclera: Conjunctivae normal.  Cardiovascular:     Rate and Rhythm: Normal rate and regular rhythm.     Pulses: Normal pulses.     Heart sounds: Normal heart sounds.  Pulmonary:     Effort: Pulmonary effort is normal.     Breath sounds: Normal breath sounds.  Abdominal:     General: Bowel sounds are normal.     Palpations: Abdomen is soft.  Musculoskeletal:        General: Normal range of motion.     Cervical back: Normal range of motion and neck supple.  Skin:    General: Skin is warm and dry.  Neurological:     Mental Status: She is alert and oriented to person, place, and time.  Psychiatric:        Mood and Affect: Mood normal.        Behavior: Behavior normal.        Thought Content: Thought content normal.        Judgment: Judgment normal.       No results found for any visits on 06/09/20.  Assessment & Plan     1. Vitamin B 12 deficiency Will start Vitamin B 12 injections once weekly for one month. Than start once a month injections.  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  - cyanocobalamin (,VITAMIN B-12,) 1000 MCG/ML injection; Inject 1 mL (1,000 mcg total) into the muscle once for 1 dose. Inject 1 mL into the muscle once a week for 3 weeks and then once monthly  Dispense: 1 mL; Refill: 5 - cyanocobalamin ((VITAMIN B-12)) injection 1,000 mcg  2. Hypothyroidism, unspecified type Follow-up 3 months. - levothyroxine (SYNTHROID) 75 MCG tablet; Take 1 tablet (75 mcg total) by mouth daily before breakfast.  Dispense: 30 tablet; Refill: 5  3. Essential (primary) hypertension Blood pressure elevated today but patient not sure about at home.  We will get home blood pressure readings and will treat on next visit if still elevated.  4. Bipolar affective disorder, depressed, severe (HCC) Per  psychiatry  5. Borderline personality disorder [facility]) Per psychiatry  6. Class 2 obesity due to excess calories without serious comorbidity with body mass index (BMI) of 36.0 to 36.9 in adult Right now the only issue is hypertension that we know of.  Lifestyle with diet and exercise encouraged.  Slow weight loss would be a great outcome.  No follow-ups on file.      I, Megan Mans, MD, have reviewed all documentation for this visit. The documentation on 06/09/20 for the exam, diagnosis, procedures, and orders are all accurate and complete.    Cleopha Indelicato Wendelyn Breslow, MD  [facility] [phone] (phone) [phone] (fax)  [facility]

## 2020-06-09 ENCOUNTER — Encounter: Payer: Self-pay | Admitting: Family Medicine

## 2020-06-09 ENCOUNTER — Ambulatory Visit (INDEPENDENT_AMBULATORY_CARE_PROVIDER_SITE_OTHER): Payer: BC Managed Care – PPO | Admitting: Family Medicine

## 2020-06-09 ENCOUNTER — Other Ambulatory Visit: Payer: Self-pay

## 2020-06-09 VITALS — BP 158/77 | HR 106 | Temp 96.6°F | Resp 16 | Ht 67.0 in | Wt 235.0 lb

## 2020-06-09 DIAGNOSIS — E6609 Other obesity due to excess calories: Secondary | ICD-10-CM

## 2020-06-09 DIAGNOSIS — E039 Hypothyroidism, unspecified: Secondary | ICD-10-CM | POA: Diagnosis not present

## 2020-06-09 DIAGNOSIS — F314 Bipolar disorder, current episode depressed, severe, without psychotic features: Secondary | ICD-10-CM

## 2020-06-09 DIAGNOSIS — I10 Essential (primary) hypertension: Secondary | ICD-10-CM | POA: Diagnosis not present

## 2020-06-09 DIAGNOSIS — F603 Borderline personality disorder: Secondary | ICD-10-CM

## 2020-06-09 DIAGNOSIS — E538 Deficiency of other specified B group vitamins: Secondary | ICD-10-CM | POA: Diagnosis not present

## 2020-06-09 DIAGNOSIS — Z6836 Body mass index (BMI) 36.0-36.9, adult: Secondary | ICD-10-CM

## 2020-06-09 MED ORDER — CYANOCOBALAMIN 1000 MCG/ML IJ SOLN
1000.0000 ug | Freq: Once | INTRAMUSCULAR | Status: AC
  Administered 2020-06-09: 1000 ug via INTRAMUSCULAR

## 2020-06-09 MED ORDER — LEVOTHYROXINE SODIUM 75 MCG PO TABS: 75.0000 ug | ORAL_TABLET | Freq: Every day | ORAL | 5 refills | Status: DC

## 2020-06-09 MED ORDER — CYANOCOBALAMIN 1000 MCG/ML IJ SOLN: 1000.0000 ug | Freq: Once | INTRAMUSCULAR | 5 refills | Status: AC

## 2020-06-09 NOTE — Patient Instructions (Signed)
For weight loss try Weight Watchers. If it's white eat less of it.

## 2020-06-10 DIAGNOSIS — F4312 Post-traumatic stress disorder, chronic: Secondary | ICD-10-CM | POA: Diagnosis not present

## 2020-06-17 DIAGNOSIS — F4312 Post-traumatic stress disorder, chronic: Secondary | ICD-10-CM | POA: Diagnosis not present

## 2020-06-24 DIAGNOSIS — F4312 Post-traumatic stress disorder, chronic: Secondary | ICD-10-CM | POA: Diagnosis not present

## 2020-06-30 DIAGNOSIS — F4312 Post-traumatic stress disorder, chronic: Secondary | ICD-10-CM | POA: Diagnosis not present

## 2020-07-08 DIAGNOSIS — F4312 Post-traumatic stress disorder, chronic: Secondary | ICD-10-CM | POA: Diagnosis not present

## 2020-07-15 DIAGNOSIS — F4312 Post-traumatic stress disorder, chronic: Secondary | ICD-10-CM | POA: Diagnosis not present

## 2020-07-22 DIAGNOSIS — F4312 Post-traumatic stress disorder, chronic: Secondary | ICD-10-CM | POA: Diagnosis not present

## 2020-08-12 DIAGNOSIS — F4312 Post-traumatic stress disorder, chronic: Secondary | ICD-10-CM | POA: Diagnosis not present

## 2020-08-19 DIAGNOSIS — F4312 Post-traumatic stress disorder, chronic: Secondary | ICD-10-CM | POA: Diagnosis not present

## 2020-08-26 DIAGNOSIS — F4312 Post-traumatic stress disorder, chronic: Secondary | ICD-10-CM | POA: Diagnosis not present

## 2020-09-02 DIAGNOSIS — F4312 Post-traumatic stress disorder, chronic: Secondary | ICD-10-CM | POA: Diagnosis not present

## 2020-09-04 ENCOUNTER — Telehealth: Payer: Self-pay

## 2020-09-04 NOTE — Telephone Encounter (Signed)
Copied from CRM [id number]. Topic: General - Other >> Sep 04, 2020 12:10 PM Jaquita Rector A wrote: Reason for CRM: Patient called to  inqure of Dr Sullivan Lone  if she will need labs for her visit on 09/11/20 if so asking can she come in before hand get that done so the results can be discussed at time of her visit. Please call Ph# [phone] with an answer.

## 2020-09-08 NOTE — Telephone Encounter (Signed)
Patient was advised if labs are needed, we will get those the at her visit.

## 2020-09-09 DIAGNOSIS — F4312 Post-traumatic stress disorder, chronic: Secondary | ICD-10-CM | POA: Diagnosis not present

## 2020-09-09 DIAGNOSIS — F4310 Post-traumatic stress disorder, unspecified: Secondary | ICD-10-CM | POA: Diagnosis not present

## 2020-09-11 ENCOUNTER — Encounter: Payer: Self-pay | Admitting: Family Medicine

## 2020-09-11 ENCOUNTER — Other Ambulatory Visit: Payer: Self-pay

## 2020-09-11 ENCOUNTER — Ambulatory Visit (INDEPENDENT_AMBULATORY_CARE_PROVIDER_SITE_OTHER): Payer: BC Managed Care – PPO | Admitting: Family Medicine

## 2020-09-11 VITALS — BP 122/86 | HR 83 | Ht 67.0 in | Wt 236.8 lb

## 2020-09-11 DIAGNOSIS — F32A Depression, unspecified: Secondary | ICD-10-CM

## 2020-09-11 DIAGNOSIS — D649 Anemia, unspecified: Secondary | ICD-10-CM

## 2020-09-11 DIAGNOSIS — D72829 Elevated white blood cell count, unspecified: Secondary | ICD-10-CM | POA: Diagnosis not present

## 2020-09-11 DIAGNOSIS — Z23 Encounter for immunization: Secondary | ICD-10-CM | POA: Diagnosis not present

## 2020-09-11 DIAGNOSIS — E876 Hypokalemia: Secondary | ICD-10-CM

## 2020-09-11 DIAGNOSIS — R5383 Other fatigue: Secondary | ICD-10-CM

## 2020-09-11 DIAGNOSIS — F314 Bipolar disorder, current episode depressed, severe, without psychotic features: Secondary | ICD-10-CM

## 2020-09-11 DIAGNOSIS — E538 Deficiency of other specified B group vitamins: Secondary | ICD-10-CM | POA: Diagnosis not present

## 2020-09-11 DIAGNOSIS — G44219 Episodic tension-type headache, not intractable: Secondary | ICD-10-CM

## 2020-09-11 DIAGNOSIS — I10 Essential (primary) hypertension: Secondary | ICD-10-CM | POA: Diagnosis not present

## 2020-09-11 DIAGNOSIS — F603 Borderline personality disorder: Secondary | ICD-10-CM

## 2020-09-11 DIAGNOSIS — E039 Hypothyroidism, unspecified: Secondary | ICD-10-CM | POA: Diagnosis not present

## 2020-09-11 NOTE — Progress Notes (Signed)
Established patient visit   Patient: Shelby Gallegos   DOB: 06-Feb-1981   39 y.o. Female  MRN: [id number] Visit Date: 09/11/2020  Today's healthcare provider: Megan Mans, MD   No chief complaint on file.  Subjective    HPI  Patient is stable. Followed closely by psychiatry. Hypertension, follow-up  BP Readings from Last 3 Encounters:  06/09/20 (!) 158/77  02/17/19 109/71  05/25/17 110/70   Wt Readings from Last 3 Encounters:  06/09/20 235 lb (106.6 kg)  02/15/19 213 lb (96.6 kg)  05/25/17 171 lb (77.6 kg)     She was last seen for hypertension 3 months ago.  BP at that visit was 158/77. Management since that visit includes; Blood pressure elevated today but patient not sure about at home.  We will get home blood pressure readings and will treat on next visit if still elevated. She reports good compliance with treatment. She is not having side effects.  She is exercising. She is not adherent to low salt diet.   Outside blood pressures are .  She does not smoke.  Use of agents associated with hypertension: none.   ---------------------------------------------------------------------------------------------------  Vitamin B 12 deficiency From 06/09/2020-Will start Vitamin B 12 injections once weekly for one month. Than start once a month injections.  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      Medications: Outpatient Medications Prior to Visit  Medication Sig  . amphetamine-dextroamphetamine (ADDERALL) 20 MG tablet Take 20 mg by mouth 3 (three) times daily.  Dorise Hiss (AIMOVIG, 140 MG DOSE,) 70 MG/ML SOAJ Inject 70 mg into the skin every 30 (thirty) days.  Marland Kitchen gabapentin (NEURONTIN) 400 MG capsule Take 400 mg by mouth 4 (four) times daily.  Marland Kitchen lamoTRIgine (LAMICTAL) 150 MG tablet Take 150 mg by mouth 2 (two) times daily.  Marland Kitchen levothyroxine (SYNTHROID) 75 MCG tablet Take 1 tablet (75 mcg total) by mouth daily  before breakfast.  . Lurasidone HCl 60 MG TABS Take 1 tablet (60 mg total) by mouth daily with breakfast. (Patient not taking: Reported on 06/09/2020)  . OXcarbazepine (TRILEPTAL) 150 MG tablet Take 1 tablet (150 mg total) by mouth at bedtime.  . prazosin (MINIPRESS) 2 MG capsule Take 2-4 mg by mouth at bedtime.  . SUMAtriptan (IMITREX) 6 MG/0.5ML SOLN injection Inject 6 mg into the skin daily as needed.  . topiramate (TOPAMAX) 100 MG tablet   . VRAYLAR capsule Take 1.5 mg by mouth daily.   No facility-administered medications prior to visit.    Review of Systems  Constitutional: Negative for appetite change, chills, fatigue and fever.  Respiratory: Negative for chest tightness and shortness of breath.   Cardiovascular: Negative for chest pain and palpitations.  Gastrointestinal: Negative for abdominal pain, nausea and vomiting.  Neurological: Negative for dizziness and weakness.       Objective    There were no vitals taken for this visit. Wt Readings from Last 3 Encounters:  09/11/20 236 lb 12.8 oz (107.4 kg)  06/09/20 235 lb (106.6 kg)  02/15/19 213 lb (96.6 kg)      Physical Exam Vitals and nursing note reviewed.  Constitutional:      Appearance: Normal appearance. She is normal weight.  HENT:     Right Ear: External ear normal.     Left Ear: External ear normal.  Eyes:     General: No scleral icterus.    Conjunctiva/sclera: Conjunctivae normal.  Cardiovascular:  Rate and Rhythm: Normal rate and regular rhythm.     Pulses: Normal pulses.     Heart sounds: Normal heart sounds.  Pulmonary:     Effort: Pulmonary effort is normal.     Breath sounds: Normal breath sounds.  Abdominal:     General: Bowel sounds are normal.     Palpations: Abdomen is soft.  Musculoskeletal:        General: Normal range of motion.     Cervical back: Normal range of motion and neck supple.  Skin:    General: Skin is warm and dry.  Neurological:     Mental Status: She is alert and  oriented to person, place, and time.  Psychiatric:        Mood and Affect: Mood normal.        Behavior: Behavior normal.        Thought Content: Thought content normal.        Judgment: Judgment normal.       No results found for any visits on 09/11/20.  Assessment & Plan     1. Fatigue due to depression   - COMPLETE METABOLIC PANEL WITH GFR  2. B12 deficiency  - B12 and Folate Panel  3. Essential (primary) hypertension  - COMPLETE METABOLIC PANEL WITH GFR  4. Leukocytosis, unspecified type   5. Bipolar affective disorder, depressed, severe (HCC) Per Dr. Evelene Croon.  6. Borderline personality disorder (HCC)   7. Hypothyroidism, unspecified type  - TSH  8. Hypokalemia  - COMPLETE METABOLIC PANEL WITH GFR  9. Normocytic anemia  - CBC w/Diff/Platelet  10. Need for influenza vaccination  - Flu Vaccine QUAD 36+ mos IM   No follow-ups on file.         Lauraine Crespo Wendelyn Breslow, MD  [facility] [phone] (phone) [phone] (fax)  [facility]

## 2020-09-12 LAB — COMPREHENSIVE METABOLIC PANEL
ALT: 15 IU/L (ref 0–32)
AST: 13 IU/L (ref 0–40)
Albumin/Globulin Ratio: 2 (ref 1.2–2.2)
Albumin: 4.3 g/dL (ref 3.8–4.8)
Alkaline Phosphatase: 101 IU/L (ref 44–121)
BUN/Creatinine Ratio: 8 — ABNORMAL LOW (ref 9–23)
BUN: 9 mg/dL (ref 6–20)
Bilirubin Total: <0.2 mg/dL (ref 0.0–1.2)
CO2: 22 mmol/L (ref 20–29)
Calcium: 9.4 mg/dL (ref 8.7–10.2)
Chloride: 105 mmol/L (ref 96–106)
Creatinine, Ser: 1.11 mg/dL — ABNORMAL HIGH (ref 0.57–1.00)
GFR calc Af Amer: 72 mL/min/{1.73_m2} (ref >59)
GFR calc non Af Amer: 63 mL/min/{1.73_m2} (ref >59)
Globulin, Total: 2.2 g/dL (ref 1.5–4.5)
Glucose: 86 mg/dL (ref 65–99)
Potassium: 4.3 mmol/L (ref 3.5–5.2)
Sodium: 141 mmol/L (ref 134–144)
Total Protein: 6.5 g/dL (ref 6.0–8.5)

## 2020-09-12 LAB — CBC WITH DIFFERENTIAL/PLATELET
Basophils Absolute: 0 10*3/uL (ref 0.0–0.2)
Basos: 1 %
EOS (ABSOLUTE): 0.2 10*3/uL (ref 0.0–0.4)
Eos: 3 %
Hematocrit: 39.6 % (ref 34.0–46.6)
Hemoglobin: 12.9 g/dL (ref 11.1–15.9)
Immature Grans (Abs): 0 10*3/uL (ref 0.0–0.1)
Immature Granulocytes: 0 %
Lymphocytes Absolute: 1.3 10*3/uL (ref 0.7–3.1)
Lymphs: 20 %
MCH: 29 pg (ref 26.6–33.0)
MCHC: 32.6 g/dL (ref 31.5–35.7)
MCV: 89 fL (ref 79–97)
Monocytes Absolute: 0.5 10*3/uL (ref 0.1–0.9)
Monocytes: 8 %
Neutrophils Absolute: 4.4 10*3/uL (ref 1.4–7.0)
Neutrophils: 68 %
Platelets: 344 10*3/uL (ref 150–450)
RBC: 4.45 x10E6/uL (ref 3.77–5.28)
RDW: 12.8 % (ref 11.7–15.4)
WBC: 6.5 10*3/uL (ref 3.4–10.8)

## 2020-09-12 LAB — B12 AND FOLATE PANEL
Folate: 5.7 ng/mL (ref >3.0)
Vitamin B-12: 900 pg/mL (ref 232–1245)

## 2020-09-12 LAB — TSH: TSH: 2.79 u[IU]/mL (ref 0.450–4.500)

## 2020-09-16 DIAGNOSIS — F4312 Post-traumatic stress disorder, chronic: Secondary | ICD-10-CM | POA: Diagnosis not present

## 2020-09-30 DIAGNOSIS — F4312 Post-traumatic stress disorder, chronic: Secondary | ICD-10-CM | POA: Diagnosis not present

## 2020-10-06 ENCOUNTER — Other Ambulatory Visit: Payer: Self-pay | Admitting: Family Medicine

## 2020-10-06 DIAGNOSIS — E538 Deficiency of other specified B group vitamins: Secondary | ICD-10-CM

## 2020-10-06 NOTE — Telephone Encounter (Signed)
Requested medication (s) are due for refill today: -  Requested medication (s) are on the active medication list: historical med  Last refill:  09/11/20  Future visit scheduled: yes  Notes to clinic:  historical provider and med not assigned to a protocol   Requested Prescriptions  Pending Prescriptions Disp Refills   cyanocobalamin (,VITAMIN B-12,) 1000 MCG/ML injection [Pharmacy Med Name: CYANOCOBALAMIN 1,000 MCG/ML] 1 mL 0    Sig: Inject 1 mL (1,000 mcg total) into the muscle once for 1 dose. Inject 1 mL into the muscle once a week for 3 weeks and then once monthly      Off-Protocol Failed - 10/06/2020 10:59 AM      Failed - Medication not assigned to a protocol, review manually.      Passed - Valid encounter within last 12 months    Recent Outpatient Visits           3 weeks ago Fatigue due to depression   [facility] Shelby Hudson., MD   3 months ago Vitamin B 12 deficiency   [facility] Shelby Hudson., MD   5 years ago Weight gain, abnormal   [facility] Shelby Hudson., MD       Future Appointments             In 3 months Shelby Hudson., MD [facility]           Off-Protocol Failed - 10/06/2020 10:59 AM      Failed - Medication not assigned to a protocol, review manually.      Passed - Valid encounter within last 12 months    Recent Outpatient Visits           3 weeks ago Fatigue due to depression   [facility] Shelby Hudson., MD   3 months ago Vitamin B 12 deficiency   [facility] Shelby Hudson., MD   5 years ago Weight gain, abnormal   [facility] Shelby Hudson., MD       Future Appointments             In 3 months Shelby Hudson., MD [facility]

## 2020-10-26 DIAGNOSIS — F4312 Post-traumatic stress disorder, chronic: Secondary | ICD-10-CM | POA: Diagnosis not present

## 2020-11-09 DIAGNOSIS — F4312 Post-traumatic stress disorder, chronic: Secondary | ICD-10-CM | POA: Diagnosis not present

## 2020-11-18 DIAGNOSIS — F4312 Post-traumatic stress disorder, chronic: Secondary | ICD-10-CM | POA: Diagnosis not present

## 2020-11-25 DIAGNOSIS — F4312 Post-traumatic stress disorder, chronic: Secondary | ICD-10-CM | POA: Diagnosis not present

## 2020-12-04 DIAGNOSIS — F3131 Bipolar disorder, current episode depressed, mild: Secondary | ICD-10-CM | POA: Diagnosis not present

## 2020-12-04 DIAGNOSIS — F3174 Bipolar disorder, in full remission, most recent episode manic: Secondary | ICD-10-CM | POA: Diagnosis not present

## 2020-12-09 DIAGNOSIS — F4312 Post-traumatic stress disorder, chronic: Secondary | ICD-10-CM | POA: Diagnosis not present

## 2020-12-16 DIAGNOSIS — F4312 Post-traumatic stress disorder, chronic: Secondary | ICD-10-CM | POA: Diagnosis not present

## 2020-12-23 DIAGNOSIS — F4312 Post-traumatic stress disorder, chronic: Secondary | ICD-10-CM | POA: Diagnosis not present

## 2020-12-30 DIAGNOSIS — F4312 Post-traumatic stress disorder, chronic: Secondary | ICD-10-CM | POA: Diagnosis not present

## 2021-01-12 NOTE — Progress Notes (Signed)
I,[name],acting as a scribe for Megan Mans, MD.,have documented all relevant documentation on the behalf of Megan Mans, MD,as directed by  Megan Mans, MD while in the presence of Megan Mans, MD.   Established patient visit   Patient: Shelby Gallegos   DOB: 1980/12/04   40 y.o. Female  MRN: [id number] Visit Date: 01/13/2021  Today's healthcare provider: Megan Mans, MD   Chief Complaint  Patient presents with  . Follow-up  . Hypertension   Subjective    HPI  She comes in today for recheck.  She is doing well but is struggling with her emotional health.  She sees Dr. Evelene Croon regularly Hypertension, follow-up  BP Readings from Last 3 Encounters:  01/13/21 113/74  09/11/20 122/86  06/09/20 (!) 158/77   Wt Readings from Last 3 Encounters:  01/13/21 236 lb (107 kg)  09/11/20 236 lb 12.8 oz (107.4 kg)  06/09/20 235 lb (106.6 kg)     She was last seen for hypertension 4 months ago.  BP at that visit was 122/86. Management since that visit includes; not currently on medication. She reports good compliance with treatment. She is not having side effects. none She is exercising. She is not adherent to low salt diet.   Outside blood pressures are not checking.  She does not smoke.  Use of agents associated with hypertension: NSAIDS.   --------------------------------------------------------------------  B12 deficiency From 09/11/2020-labs checked showing-normal.  Bipolar affective disorder, depressed, severe (HCC) From 09/11/2020-Per Dr. Evelene Croon.  Normocytic anemia From 09/11/2020-labs checked showing-normal.      Medications: Outpatient Medications Prior to Visit  Medication Sig  . ALPRAZolam (XANAX) 0.5 MG tablet Take 0.5 mg by mouth as needed.  Marland Kitchen amphetamine-dextroamphetamine (ADDERALL) 20 MG tablet Take 20 mg by mouth 3 (three) times daily.  Marland Kitchen aspirin-acetaminophen-caffeine (EXCEDRIN MIGRAINE) 250-250-65 MG tablet Take 1  tablet by mouth as needed.  . cyanocobalamin (,VITAMIN B-12,) 1000 MCG/ML injection Inject 1 mL (1,000 mcg total) into the muscle once for 1 dose. Inject 1 mL into the muscle once a week for 3 weeks and then once monthly  . Erenumab-aooe 70 MG/ML SOAJ Inject 70 mg into the skin every 30 (thirty) days.  Marland Kitchen gabapentin (NEURONTIN) 400 MG capsule Take 400 mg by mouth 4 (four) times daily.  Marland Kitchen lamoTRIgine (LAMICTAL) 150 MG tablet Take 150 mg by mouth 2 (two) times daily.  Marland Kitchen levothyroxine (SYNTHROID) 75 MCG tablet Take 1 tablet (75 mcg total) by mouth daily before breakfast.  . OXcarbazepine (TRILEPTAL) 150 MG tablet Take 1 tablet (150 mg total) by mouth at bedtime.  . prazosin (MINIPRESS) 2 MG capsule Take 2-4 mg by mouth at bedtime.  . SUMAtriptan 6 MG/0.5ML SOAJ Inject 6 mLs into the muscle as needed.  . topiramate (TOPAMAX) 100 MG tablet   . VRAYLAR capsule Take 1.5 mg by mouth daily.   No facility-administered medications prior to visit.    Review of Systems  Constitutional: Negative for appetite change, chills, fatigue and fever.  Respiratory: Negative for chest tightness and shortness of breath.   Cardiovascular: Negative for chest pain and palpitations.  Gastrointestinal: Negative for abdominal pain, nausea and vomiting.  Neurological: Negative for dizziness and weakness.        Objective    BP 113/74 (BP Location: Left Arm, Patient Position: Sitting, Cuff Size: Large)   Pulse 93   Temp 98.2 F (36.8 C) (Oral)   Resp 16   Ht 5\' 7"  (1.702 m)  Wt 236 lb (107 kg)   SpO2 97%   BMI 36.96 kg/m  BP Readings from Last 3 Encounters:  01/13/21 113/74  09/11/20 122/86  06/09/20 (!) 158/77   Wt Readings from Last 3 Encounters:  01/13/21 236 lb (107 kg)  09/11/20 236 lb 12.8 oz (107.4 kg)  06/09/20 235 lb (106.6 kg)       Physical Exam Vitals and nursing note reviewed.  Constitutional:      Appearance: Normal appearance. She is normal weight.  HENT:     Right Ear: External  ear normal.     Left Ear: External ear normal.  Eyes:     General: No scleral icterus.    Conjunctiva/sclera: Conjunctivae normal.  Cardiovascular:     Rate and Rhythm: Normal rate and regular rhythm.     Pulses: Normal pulses.     Heart sounds: Normal heart sounds.  Pulmonary:     Effort: Pulmonary effort is normal.     Breath sounds: Normal breath sounds.  Abdominal:     General: Bowel sounds are normal.     Palpations: Abdomen is soft.  Musculoskeletal:     Cervical back: Neck supple.  Skin:    General: Skin is warm and dry.  Neurological:     Mental Status: She is alert and oriented to person, place, and time.  Psychiatric:        Mood and Affect: Mood normal.        Behavior: Behavior normal.        Thought Content: Thought content normal.        Judgment: Judgment normal.       No results found for any visits on 01/13/21.  Assessment & Plan     1. Essential (primary) hypertension Good control on Minipress only  2. Bipolar affective disorder, depressed, severe (HCC) By psychiatry  3. Attention deficit hyperactivity disorder (ADHD), combined type   4. Class 2 obesity due to excess calories without serious comorbidity with body mass index (BMI) of 36.0 to 36.9 in adult Discussed smaller portions in addition to regular exercise. Follow-up for complete physical in the fall.   No follow-ups on file.      I, Megan Mans, MD, have reviewed all documentation for this visit. The documentation on 01/13/21 for the exam, diagnosis, procedures, and orders are all accurate and complete.    Sofie Schendel Wendelyn Breslow, MD  [facility] [phone] (phone) [phone] (fax)  [facility]

## 2021-01-13 ENCOUNTER — Encounter: Payer: Self-pay | Admitting: Family Medicine

## 2021-01-13 ENCOUNTER — Ambulatory Visit (INDEPENDENT_AMBULATORY_CARE_PROVIDER_SITE_OTHER): Payer: BC Managed Care – PPO | Admitting: Family Medicine

## 2021-01-13 ENCOUNTER — Other Ambulatory Visit: Payer: Self-pay

## 2021-01-13 VITALS — BP 113/74 | HR 93 | Temp 98.2°F | Resp 16 | Ht 67.0 in | Wt 236.0 lb

## 2021-01-13 DIAGNOSIS — I10 Essential (primary) hypertension: Secondary | ICD-10-CM

## 2021-01-13 DIAGNOSIS — F314 Bipolar disorder, current episode depressed, severe, without psychotic features: Secondary | ICD-10-CM

## 2021-01-13 DIAGNOSIS — F902 Attention-deficit hyperactivity disorder, combined type: Secondary | ICD-10-CM | POA: Diagnosis not present

## 2021-01-13 DIAGNOSIS — Z6836 Body mass index (BMI) 36.0-36.9, adult: Secondary | ICD-10-CM

## 2021-01-13 DIAGNOSIS — E6609 Other obesity due to excess calories: Secondary | ICD-10-CM | POA: Diagnosis not present

## 2021-01-20 DIAGNOSIS — F4312 Post-traumatic stress disorder, chronic: Secondary | ICD-10-CM | POA: Diagnosis not present

## 2021-01-27 DIAGNOSIS — F4312 Post-traumatic stress disorder, chronic: Secondary | ICD-10-CM | POA: Diagnosis not present

## 2021-01-30 DIAGNOSIS — F4312 Post-traumatic stress disorder, chronic: Secondary | ICD-10-CM | POA: Diagnosis not present

## 2021-02-03 DIAGNOSIS — F4312 Post-traumatic stress disorder, chronic: Secondary | ICD-10-CM | POA: Diagnosis not present

## 2021-02-10 DIAGNOSIS — F4312 Post-traumatic stress disorder, chronic: Secondary | ICD-10-CM | POA: Diagnosis not present

## 2021-02-17 DIAGNOSIS — F4312 Post-traumatic stress disorder, chronic: Secondary | ICD-10-CM | POA: Diagnosis not present

## 2021-02-24 DIAGNOSIS — F4312 Post-traumatic stress disorder, chronic: Secondary | ICD-10-CM | POA: Diagnosis not present

## 2021-03-10 DIAGNOSIS — F4312 Post-traumatic stress disorder, chronic: Secondary | ICD-10-CM | POA: Diagnosis not present

## 2021-03-17 DIAGNOSIS — F4312 Post-traumatic stress disorder, chronic: Secondary | ICD-10-CM | POA: Diagnosis not present

## 2021-03-24 DIAGNOSIS — F4312 Post-traumatic stress disorder, chronic: Secondary | ICD-10-CM | POA: Diagnosis not present

## 2021-04-07 DIAGNOSIS — F4312 Post-traumatic stress disorder, chronic: Secondary | ICD-10-CM | POA: Diagnosis not present

## 2021-04-14 DIAGNOSIS — F4312 Post-traumatic stress disorder, chronic: Secondary | ICD-10-CM | POA: Diagnosis not present

## 2021-04-30 DIAGNOSIS — F4312 Post-traumatic stress disorder, chronic: Secondary | ICD-10-CM | POA: Diagnosis not present

## 2021-05-05 DIAGNOSIS — F4312 Post-traumatic stress disorder, chronic: Secondary | ICD-10-CM | POA: Diagnosis not present

## 2021-05-12 DIAGNOSIS — F4312 Post-traumatic stress disorder, chronic: Secondary | ICD-10-CM | POA: Diagnosis not present

## 2021-05-28 DIAGNOSIS — F4310 Post-traumatic stress disorder, unspecified: Secondary | ICD-10-CM | POA: Diagnosis not present

## 2021-05-28 DIAGNOSIS — F900 Attention-deficit hyperactivity disorder, predominantly inattentive type: Secondary | ICD-10-CM | POA: Diagnosis not present

## 2021-05-28 DIAGNOSIS — F3174 Bipolar disorder, in full remission, most recent episode manic: Secondary | ICD-10-CM | POA: Diagnosis not present

## 2021-05-28 DIAGNOSIS — F3176 Bipolar disorder, in full remission, most recent episode depressed: Secondary | ICD-10-CM | POA: Diagnosis not present

## 2021-06-09 DIAGNOSIS — F4312 Post-traumatic stress disorder, chronic: Secondary | ICD-10-CM | POA: Diagnosis not present

## 2021-06-16 DIAGNOSIS — F4312 Post-traumatic stress disorder, chronic: Secondary | ICD-10-CM | POA: Diagnosis not present

## 2021-06-23 DIAGNOSIS — F4312 Post-traumatic stress disorder, chronic: Secondary | ICD-10-CM | POA: Diagnosis not present

## 2021-07-02 DIAGNOSIS — F4312 Post-traumatic stress disorder, chronic: Secondary | ICD-10-CM | POA: Diagnosis not present

## 2021-07-07 DIAGNOSIS — F4312 Post-traumatic stress disorder, chronic: Secondary | ICD-10-CM | POA: Diagnosis not present

## 2021-07-09 DIAGNOSIS — F4312 Post-traumatic stress disorder, chronic: Secondary | ICD-10-CM | POA: Diagnosis not present

## 2021-07-14 DIAGNOSIS — F4312 Post-traumatic stress disorder, chronic: Secondary | ICD-10-CM | POA: Diagnosis not present

## 2021-07-16 DIAGNOSIS — F4312 Post-traumatic stress disorder, chronic: Secondary | ICD-10-CM | POA: Diagnosis not present

## 2021-07-21 DIAGNOSIS — F4312 Post-traumatic stress disorder, chronic: Secondary | ICD-10-CM | POA: Diagnosis not present

## 2021-07-28 DIAGNOSIS — F4312 Post-traumatic stress disorder, chronic: Secondary | ICD-10-CM | POA: Diagnosis not present

## 2021-08-04 DIAGNOSIS — F4312 Post-traumatic stress disorder, chronic: Secondary | ICD-10-CM | POA: Diagnosis not present

## 2021-08-06 DIAGNOSIS — F4312 Post-traumatic stress disorder, chronic: Secondary | ICD-10-CM | POA: Diagnosis not present

## 2021-08-11 DIAGNOSIS — F4312 Post-traumatic stress disorder, chronic: Secondary | ICD-10-CM | POA: Diagnosis not present

## 2021-08-27 DIAGNOSIS — F3176 Bipolar disorder, in full remission, most recent episode depressed: Secondary | ICD-10-CM | POA: Diagnosis not present

## 2021-08-27 DIAGNOSIS — F900 Attention-deficit hyperactivity disorder, predominantly inattentive type: Secondary | ICD-10-CM | POA: Diagnosis not present

## 2021-09-01 DIAGNOSIS — F4312 Post-traumatic stress disorder, chronic: Secondary | ICD-10-CM | POA: Diagnosis not present

## 2021-09-10 DIAGNOSIS — F4312 Post-traumatic stress disorder, chronic: Secondary | ICD-10-CM | POA: Diagnosis not present

## 2021-09-15 DIAGNOSIS — F4312 Post-traumatic stress disorder, chronic: Secondary | ICD-10-CM | POA: Diagnosis not present

## 2021-09-16 ENCOUNTER — Encounter: Payer: Self-pay | Admitting: Family Medicine

## 2021-09-22 DIAGNOSIS — F4312 Post-traumatic stress disorder, chronic: Secondary | ICD-10-CM | POA: Diagnosis not present

## 2021-09-29 DIAGNOSIS — F4312 Post-traumatic stress disorder, chronic: Secondary | ICD-10-CM | POA: Diagnosis not present

## 2021-10-05 DIAGNOSIS — F4312 Post-traumatic stress disorder, chronic: Secondary | ICD-10-CM | POA: Diagnosis not present

## 2021-10-13 DIAGNOSIS — F4312 Post-traumatic stress disorder, chronic: Secondary | ICD-10-CM | POA: Diagnosis not present

## 2021-10-20 DIAGNOSIS — F4312 Post-traumatic stress disorder, chronic: Secondary | ICD-10-CM | POA: Diagnosis not present

## 2021-10-24 ENCOUNTER — Other Ambulatory Visit: Payer: Self-pay | Admitting: Family Medicine

## 2021-10-24 DIAGNOSIS — E538 Deficiency of other specified B group vitamins: Secondary | ICD-10-CM

## 2021-10-27 DIAGNOSIS — F4312 Post-traumatic stress disorder, chronic: Secondary | ICD-10-CM | POA: Diagnosis not present

## 2021-11-03 DIAGNOSIS — F4312 Post-traumatic stress disorder, chronic: Secondary | ICD-10-CM | POA: Diagnosis not present

## 2021-11-10 DIAGNOSIS — F4312 Post-traumatic stress disorder, chronic: Secondary | ICD-10-CM | POA: Diagnosis not present

## 2021-11-13 DIAGNOSIS — F4310 Post-traumatic stress disorder, unspecified: Secondary | ICD-10-CM | POA: Diagnosis not present

## 2021-11-13 DIAGNOSIS — F900 Attention-deficit hyperactivity disorder, predominantly inattentive type: Secondary | ICD-10-CM | POA: Diagnosis not present

## 2021-11-13 DIAGNOSIS — F3131 Bipolar disorder, current episode depressed, mild: Secondary | ICD-10-CM | POA: Diagnosis not present

## 2021-11-17 DIAGNOSIS — F4312 Post-traumatic stress disorder, chronic: Secondary | ICD-10-CM | POA: Diagnosis not present

## 2021-11-25 ENCOUNTER — Telehealth: Payer: BC Managed Care – PPO | Admitting: Physician Assistant

## 2021-11-25 DIAGNOSIS — S46211A Strain of muscle, fascia and tendon of other parts of biceps, right arm, initial encounter: Secondary | ICD-10-CM

## 2021-11-25 NOTE — Progress Notes (Signed)
Ms. Shelby Gallegos, eisenbeis are scheduled for a virtual visit with your provider today.    Just as we do with appointments in the office, we must obtain your consent to participate.  Your consent will be active for this visit and any virtual visit you may have with one of our providers in the next 365 days.    If you have a MyChart account, I can also send a copy of this consent to you electronically.  All virtual visits are billed to your insurance company just like a traditional visit in the office.  As this is a virtual visit, video technology does not allow for your provider to perform a traditional examination.  This may limit your provider's ability to fully assess your condition.  If your provider identifies any concerns that need to be evaluated in person or the need to arrange testing such as labs, EKG, etc, we will make arrangements to do so.    Although advances in technology are sophisticated, we cannot ensure that it will always work on either your end or our end.  If the connection with a video visit is poor, we may have to switch to a telephone visit.  With either a video or telephone visit, we are not always able to ensure that we have a secure connection.   I need to obtain your verbal consent now.   Are you willing to proceed with your visit today?   [name] has provided verbal consent on 11/25/2021 for a virtual visit (video or telephone).   Dierdre Forth, PA-C 11/25/2021  4:24 PM   Date:  11/25/2021   ID:  Shelby Gallegos, DOB 1981-02-16, MRN [id number]  Patient Location: Home Provider Location: Home Office   Participants: Patient and Provider for Visit and Wrap up  Method of visit: Video  Location of Patient: Home Location of Provider: Home Office Consent was obtain for visit over the video. Services rendered by provider: Visit was performed via video  A video enabled telemedicine application was used and I verified that I am speaking with the correct person using two  identifiers.  PCP:  [name]., MD   Chief Complaint:  arm injury  History of Present Illness:    Shelby Gallegos is a 40 y.o. female with history as stated below. Presents video telehealth for an acute care visit.  Pt reports she injured her arm at work last night and was seen by [facility] who sent her home.  Pt reports she doesn't feel comfortable going to work tonight because she lifts heavy boxes at [employer].    Reports she was lifting a pallet when the corner got stuck.  She reports pain and a pop in the right arm.  Pt reports pain with bicep curl, but denies deformity or tenderness.    Pt is taking ibuprofen with some relief. Also applied ice. Reports some weakness in the deltoid muscle, but denies numbness or tingling.    Past Medical, Surgical, Social History, Allergies, and Medications have been Reviewed.  Patient Active Problem List   Diagnosis Date Noted   Toxic encephalopathy 02/16/2019   Suicide attempt [facility])    Overdose 02/15/2019   Suicidal behavior with attempted self-injury (HCC) 02/15/2019   AKI (acute kidney injury) (HCC) 02/15/2019   Leukocytosis 02/15/2019   Laceration of multiple sites of skin 02/15/2019   Intentional acetaminophen overdose (HCC)    History of suicide attempt 04/01/2017   Borderline personality disorder (HCC) 02/16/2017   Bipolar affective disorder, depressed, severe (  HCC) 08/21/2015   ADHD (attention deficit hyperactivity disorder) 08/21/2015   Headache, migraine 09/28/2014   Abnormal ECG 11/26/2009   Essential (primary) hypertension 11/26/2009    Social History   Tobacco Use   Smoking status: Never   Smokeless tobacco: Never  Substance Use Topics   Alcohol use: No     Current Outpatient Medications:    ALPRAZolam (XANAX) 0.5 MG tablet, Take 0.5 mg by mouth as needed., Disp: , Rfl:    amphetamine-dextroamphetamine (ADDERALL) 20 MG tablet, Take 20 mg by mouth 3 (three) times daily., Disp: , Rfl:     aspirin-acetaminophen-caffeine (EXCEDRIN MIGRAINE) 250-250-65 MG tablet, Take 1 tablet by mouth as needed., Disp: , Rfl:    cyanocobalamin (,VITAMIN B-12,) 1000 MCG/ML injection, Inject 1 mL (1,000 mcg total) into the muscle once for 1 dose. Inject 1 mL into the muscle once a week for 3 weeks and then once monthly, Disp: 4 mL, Rfl: 3   Erenumab-aooe 70 MG/ML SOAJ, Inject 70 mg into the skin every 30 (thirty) days., Disp: , Rfl:    gabapentin (NEURONTIN) 400 MG capsule, Take 400 mg by mouth 4 (four) times daily., Disp: , Rfl:    lamoTRIgine (LAMICTAL) 150 MG tablet, Take 150 mg by mouth 2 (two) times daily., Disp: , Rfl:    levothyroxine (SYNTHROID) 75 MCG tablet, Take 1 tablet (75 mcg total) by mouth daily before breakfast., Disp: 30 tablet, Rfl: 5   OXcarbazepine (TRILEPTAL) 150 MG tablet, Take 1 tablet (150 mg total) by mouth at bedtime., Disp: 30 tablet, Rfl: 0   prazosin (MINIPRESS) 2 MG capsule, Take 2-4 mg by mouth at bedtime., Disp: , Rfl:    SUMAtriptan 6 MG/0.5ML SOAJ, Inject 6 mLs into the muscle as needed., Disp: , Rfl:    topiramate (TOPAMAX) 100 MG tablet, , Disp: , Rfl:    VRAYLAR capsule, Take 1.5 mg by mouth daily., Disp: , Rfl:    No Known Allergies   Review of Systems  Musculoskeletal:  Positive for joint pain and myalgias.  Neurological:  Positive for weakness (deltoid).  See HPI for history of present illness.  Physical Exam Constitutional:      General: She is not in acute distress.    Appearance: Normal appearance. She is not ill-appearing.  HENT:     Head: Normocephalic and atraumatic.     Nose: No congestion.  Eyes:     Extraocular Movements: Extraocular movements intact.  Pulmonary:     Effort: Pulmonary effort is normal.     Comments: Speaks in full sentences Musculoskeletal:        General: Normal range of motion.       Arms:     Cervical back: Normal range of motion.  Skin:    Coloration: Skin is not pale.  Neurological:     General: No focal deficit  present.     Mental Status: She is alert. Mental status is at baseline.  Psychiatric:        Mood and Affect: Mood normal.             A&P  Strain of biceps tendon, right, initial encounter  -Concern for likely tear of the biceps tendon.  Work note written.  Continue with anti-inflammatories and ice.  Please follow-up with an in person visit with orthopedic clinic.  I recommend [facility] as they have walk-in urgent care hours.   Patient voiced understanding and agreement to plan.   Time:   Today, I have spent 15 minutes with  the patient with telehealth technology discussing the above problems, reviewing the chart, previous notes, medications and orders.    Tests Ordered: No orders of the defined types were placed in this encounter.   Medication Changes: No orders of the defined types were placed in this encounter.    Disposition:  Follow up ortho clinic asap.  Yetta Barre, PA-C  11/25/2021 4:32 PM

## 2021-11-25 NOTE — Patient Instructions (Signed)
°  Strain of biceps tendon, right, initial encounter   -Concern for likely tear of the biceps tendon.  Work note written.  Continue with anti-inflammatories and ice.  Please follow-up with an in person visit with orthopedic clinic.  I recommend [facility] as they have walk-in urgent care hours.

## 2021-12-01 DIAGNOSIS — F4312 Post-traumatic stress disorder, chronic: Secondary | ICD-10-CM | POA: Diagnosis not present

## 2021-12-01 DIAGNOSIS — S56812A Strain of other muscles, fascia and tendons at forearm level, left arm, initial encounter: Secondary | ICD-10-CM | POA: Diagnosis not present

## 2021-12-01 DIAGNOSIS — M25522 Pain in left elbow: Secondary | ICD-10-CM | POA: Diagnosis not present

## 2021-12-08 DIAGNOSIS — F4312 Post-traumatic stress disorder, chronic: Secondary | ICD-10-CM | POA: Diagnosis not present

## 2021-12-09 DIAGNOSIS — S56812A Strain of other muscles, fascia and tendons at forearm level, left arm, initial encounter: Secondary | ICD-10-CM | POA: Diagnosis not present

## 2021-12-15 DIAGNOSIS — F4312 Post-traumatic stress disorder, chronic: Secondary | ICD-10-CM | POA: Diagnosis not present

## 2021-12-15 DIAGNOSIS — S56812A Strain of other muscles, fascia and tendons at forearm level, left arm, initial encounter: Secondary | ICD-10-CM | POA: Diagnosis not present

## 2021-12-17 DIAGNOSIS — S56812A Strain of other muscles, fascia and tendons at forearm level, left arm, initial encounter: Secondary | ICD-10-CM | POA: Diagnosis not present

## 2021-12-22 DIAGNOSIS — F4312 Post-traumatic stress disorder, chronic: Secondary | ICD-10-CM | POA: Diagnosis not present

## 2021-12-24 DIAGNOSIS — S46212D Strain of muscle, fascia and tendon of other parts of biceps, left arm, subsequent encounter: Secondary | ICD-10-CM | POA: Diagnosis not present

## 2021-12-29 DIAGNOSIS — F4312 Post-traumatic stress disorder, chronic: Secondary | ICD-10-CM | POA: Diagnosis not present

## 2021-12-31 DIAGNOSIS — S46212D Strain of muscle, fascia and tendon of other parts of biceps, left arm, subsequent encounter: Secondary | ICD-10-CM | POA: Diagnosis not present

## 2022-01-12 DIAGNOSIS — F3174 Bipolar disorder, in full remission, most recent episode manic: Secondary | ICD-10-CM | POA: Diagnosis not present

## 2022-01-12 DIAGNOSIS — S46212D Strain of muscle, fascia and tendon of other parts of biceps, left arm, subsequent encounter: Secondary | ICD-10-CM | POA: Diagnosis not present

## 2022-01-12 DIAGNOSIS — F900 Attention-deficit hyperactivity disorder, predominantly inattentive type: Secondary | ICD-10-CM | POA: Diagnosis not present

## 2022-01-12 DIAGNOSIS — F4312 Post-traumatic stress disorder, chronic: Secondary | ICD-10-CM | POA: Diagnosis not present

## 2022-01-12 DIAGNOSIS — F3131 Bipolar disorder, current episode depressed, mild: Secondary | ICD-10-CM | POA: Diagnosis not present

## 2022-01-14 ENCOUNTER — Encounter: Payer: Self-pay | Admitting: Family Medicine

## 2022-01-14 ENCOUNTER — Ambulatory Visit (INDEPENDENT_AMBULATORY_CARE_PROVIDER_SITE_OTHER): Payer: BC Managed Care – PPO | Admitting: Family Medicine

## 2022-01-14 ENCOUNTER — Other Ambulatory Visit: Payer: Self-pay

## 2022-01-14 VITALS — BP 131/85 | HR 87 | Temp 98.2°F | Resp 14 | Ht 67.0 in | Wt 242.0 lb

## 2022-01-14 DIAGNOSIS — R5383 Other fatigue: Secondary | ICD-10-CM

## 2022-01-14 DIAGNOSIS — F32A Depression, unspecified: Secondary | ICD-10-CM | POA: Diagnosis not present

## 2022-01-14 DIAGNOSIS — J019 Acute sinusitis, unspecified: Secondary | ICD-10-CM

## 2022-01-14 DIAGNOSIS — F314 Bipolar disorder, current episode depressed, severe, without psychotic features: Secondary | ICD-10-CM | POA: Diagnosis not present

## 2022-01-14 DIAGNOSIS — E039 Hypothyroidism, unspecified: Secondary | ICD-10-CM | POA: Diagnosis not present

## 2022-01-14 DIAGNOSIS — S46212D Strain of muscle, fascia and tendon of other parts of biceps, left arm, subsequent encounter: Secondary | ICD-10-CM | POA: Diagnosis not present

## 2022-01-14 MED ORDER — AZITHROMYCIN 250 MG PO TABS: ORAL_TABLET | ORAL | 0 refills | Status: AC

## 2022-01-14 NOTE — Progress Notes (Signed)
Established patient visit   Patient: Shelby Gallegos   DOB: 01-26-1981   41 y.o. Female  MRN: [id number] Visit Date: 01/14/2022  Today's healthcare provider: Megan Mans, MD   Chief Complaint  Patient presents with   Sinusitis   Subjective      Sinusitis This is a new problem. The current episode started yesterday. The problem has been gradually worsening since onset. There has been no fever. She is experiencing no pain. Associated symptoms include congestion, coughing, headaches, a hoarse voice, neck pain, sinus pressure, a sore throat and swollen glands. Pertinent negatives include no chills, diaphoresis, ear pain, shortness of breath or sneezing. Past treatments include oral decongestants. The treatment provided mild relief.   Patient is being seen today for possible symptoms of a sinus infection and to discuss labs.   Patient has been experiencing symptoms for 2 days with it getting worse. She has been taking over-the-counter Mucinex during the day and she states that she gets mild relief with that. She is taking over-the-counter NyQuil at night and states it is helping her sleep.  She has not tested for COVID at home.   Medications: Outpatient Medications Prior to Visit  Medication Sig   ALPRAZolam (XANAX) 0.5 MG tablet Take 0.5 mg by mouth as needed.   amphetamine-dextroamphetamine (ADDERALL) 20 MG tablet Take 20 mg by mouth 3 (three) times daily.   aspirin-acetaminophen-caffeine (EXCEDRIN MIGRAINE) 250-250-65 MG tablet Take 1 tablet by mouth as needed.   cyanocobalamin (,VITAMIN B-12,) 1000 MCG/ML injection Inject 1 mL (1,000 mcg total) into the muscle once for 1 dose. Inject 1 mL into the muscle once a week for 3 weeks and then once monthly   Erenumab-aooe 70 MG/ML SOAJ Inject 70 mg into the skin every 30 (thirty) days.   gabapentin (NEURONTIN) 400 MG capsule Take 400 mg by mouth 4 (four) times daily.   lamoTRIgine (LAMICTAL) 150 MG tablet Take 150 mg by mouth 2  (two) times daily.   levothyroxine (SYNTHROID) 75 MCG tablet Take 1 tablet (75 mcg total) by mouth daily before breakfast.   OXcarbazepine (TRILEPTAL) 150 MG tablet Take 1 tablet (150 mg total) by mouth at bedtime.   prazosin (MINIPRESS) 2 MG capsule Take 2-4 mg by mouth at bedtime.   SUMAtriptan 6 MG/0.5ML SOAJ Inject 6 mLs into the muscle as needed.   VRAYLAR capsule Take 1.5 mg by mouth daily.   topiramate (TOPAMAX) 100 MG tablet  (Patient not taking: Reported on 01/14/2022)   No facility-administered medications prior to visit.    Review of Systems  Constitutional:  Negative for chills and diaphoresis.  HENT:  Positive for congestion, hoarse voice, sinus pressure and sore throat. Negative for ear pain and sneezing.   Respiratory:  Positive for cough. Negative for shortness of breath.   Musculoskeletal:  Positive for neck pain.  Neurological:  Positive for headaches.   Last thyroid functions Lab Results  Component Value Date   TSH 2.240 01/14/2022   T4TOTAL 8.1 05/25/2017       Objective    BP 131/85 (BP Location: Right Arm, Patient Position: Sitting, Cuff Size: Normal)    Pulse 87    Temp 98.2 F (36.8 C) (Temporal)    Resp 14    Ht 5\' 7"  (1.702 m)    Wt 242 lb (109.8 kg)    SpO2 99%    BMI 37.90 kg/m  BP Readings from Last 3 Encounters:  01/14/22 131/85  01/13/21 113/74  09/11/20 122/86  Wt Readings from Last 3 Encounters:  01/14/22 242 lb (109.8 kg)  01/13/21 236 lb (107 kg)  09/11/20 236 lb 12.8 oz (107.4 kg)      Physical Exam Vitals and nursing note reviewed.  Constitutional:      Appearance: Normal appearance. She is normal weight.  HENT:     Head: Normocephalic and atraumatic.     Comments: Mild maxillary sinus tenderness    Right Ear: External ear normal.     Left Ear: External ear normal.  Eyes:     General: No scleral icterus.    Conjunctiva/sclera: Conjunctivae normal.  Cardiovascular:     Rate and Rhythm: Normal rate and regular rhythm.      Pulses: Normal pulses.     Heart sounds: Normal heart sounds.  Pulmonary:     Effort: Pulmonary effort is normal.     Breath sounds: Normal breath sounds.  Abdominal:     General: Bowel sounds are normal.     Palpations: Abdomen is soft.  Musculoskeletal:     Cervical back: Neck supple.  Skin:    General: Skin is warm and dry.  Neurological:     Mental Status: She is alert and oriented to person, place, and time.  Psychiatric:        Mood and Affect: Mood normal.        Behavior: Behavior normal.        Thought Content: Thought content normal.        Judgment: Judgment normal.      No results found for any visits on 01/14/22.  Assessment & Plan     1. Hypothyroidism, unspecified type Treat to euthyroid TSH.  Also follow A1c and patient being treated by psychiatry for bipolar. - TSH - Hemoglobin A1c  2. Fatigue due to depression  - TSH - Hemoglobin A1c  3. Acute non-recurrent sinusitis, unspecified location Probably not necessary but will treat as sinusitis at this time.  She may in fact have COVID versus other URI. - azithromycin (ZITHROMAX) 250 MG tablet; Take 2 tablets on day 1, then 1 tablet daily on days 2 through 5  Dispense: 6 tablet; Refill: 0  4. Bipolar affective disorder, depressed, severe (HCC) Doing well and stable per psychiatry. - TSH - Hemoglobin A1c   Return in about 6 months (around 07/14/2022).      I, Megan Mans, MD, have reviewed all documentation for this visit. The documentation on 01/19/22 for the exam, diagnosis, procedures, and orders are all accurate and complete.    Ramia Sidney Wendelyn Breslow, MD  [facility] [phone] (phone) [phone] (fax)  [facility]

## 2022-01-15 LAB — HEMOGLOBIN A1C
Est. average glucose Bld gHb Est-mCnc: 126 mg/dL
Hgb A1c MFr Bld: 6 % — ABNORMAL HIGH (ref 4.8–5.6)

## 2022-01-15 LAB — TSH: TSH: 2.24 u[IU]/mL (ref 0.450–4.500)

## 2022-01-21 DIAGNOSIS — S46212D Strain of muscle, fascia and tendon of other parts of biceps, left arm, subsequent encounter: Secondary | ICD-10-CM | POA: Diagnosis not present

## 2022-01-26 DIAGNOSIS — F4312 Post-traumatic stress disorder, chronic: Secondary | ICD-10-CM | POA: Diagnosis not present

## 2022-01-26 DIAGNOSIS — S46212D Strain of muscle, fascia and tendon of other parts of biceps, left arm, subsequent encounter: Secondary | ICD-10-CM | POA: Diagnosis not present

## 2022-02-10 DIAGNOSIS — M25522 Pain in left elbow: Secondary | ICD-10-CM | POA: Diagnosis not present

## 2022-02-11 DIAGNOSIS — F3132 Bipolar disorder, current episode depressed, moderate: Secondary | ICD-10-CM | POA: Diagnosis not present

## 2022-02-11 DIAGNOSIS — F4310 Post-traumatic stress disorder, unspecified: Secondary | ICD-10-CM | POA: Diagnosis not present

## 2022-02-11 DIAGNOSIS — F900 Attention-deficit hyperactivity disorder, predominantly inattentive type: Secondary | ICD-10-CM | POA: Diagnosis not present

## 2022-02-16 DIAGNOSIS — F4312 Post-traumatic stress disorder, chronic: Secondary | ICD-10-CM | POA: Diagnosis not present

## 2022-02-22 DIAGNOSIS — M25522 Pain in left elbow: Secondary | ICD-10-CM | POA: Diagnosis not present

## 2022-02-23 DIAGNOSIS — F4312 Post-traumatic stress disorder, chronic: Secondary | ICD-10-CM | POA: Diagnosis not present

## 2022-03-02 DIAGNOSIS — F4312 Post-traumatic stress disorder, chronic: Secondary | ICD-10-CM | POA: Diagnosis not present

## 2022-03-08 DIAGNOSIS — M25522 Pain in left elbow: Secondary | ICD-10-CM | POA: Diagnosis not present

## 2022-03-09 DIAGNOSIS — F4312 Post-traumatic stress disorder, chronic: Secondary | ICD-10-CM | POA: Diagnosis not present

## 2022-03-15 DIAGNOSIS — M25522 Pain in left elbow: Secondary | ICD-10-CM | POA: Diagnosis not present

## 2022-03-16 DIAGNOSIS — F4312 Post-traumatic stress disorder, chronic: Secondary | ICD-10-CM | POA: Diagnosis not present

## 2022-03-23 DIAGNOSIS — F4312 Post-traumatic stress disorder, chronic: Secondary | ICD-10-CM | POA: Diagnosis not present

## 2022-03-25 DIAGNOSIS — F3176 Bipolar disorder, in full remission, most recent episode depressed: Secondary | ICD-10-CM | POA: Diagnosis not present

## 2022-03-25 DIAGNOSIS — F900 Attention-deficit hyperactivity disorder, predominantly inattentive type: Secondary | ICD-10-CM | POA: Diagnosis not present

## 2022-03-25 DIAGNOSIS — F4310 Post-traumatic stress disorder, unspecified: Secondary | ICD-10-CM | POA: Diagnosis not present

## 2022-03-25 DIAGNOSIS — F3174 Bipolar disorder, in full remission, most recent episode manic: Secondary | ICD-10-CM | POA: Diagnosis not present

## 2022-04-02 DIAGNOSIS — F4312 Post-traumatic stress disorder, chronic: Secondary | ICD-10-CM | POA: Diagnosis not present

## 2022-04-06 DIAGNOSIS — F4312 Post-traumatic stress disorder, chronic: Secondary | ICD-10-CM | POA: Diagnosis not present

## 2022-04-13 DIAGNOSIS — F4312 Post-traumatic stress disorder, chronic: Secondary | ICD-10-CM | POA: Diagnosis not present

## 2022-04-20 DIAGNOSIS — F4312 Post-traumatic stress disorder, chronic: Secondary | ICD-10-CM | POA: Diagnosis not present

## 2022-04-22 DIAGNOSIS — F4312 Post-traumatic stress disorder, chronic: Secondary | ICD-10-CM | POA: Diagnosis not present

## 2022-04-27 DIAGNOSIS — F4312 Post-traumatic stress disorder, chronic: Secondary | ICD-10-CM | POA: Diagnosis not present

## 2022-04-29 DIAGNOSIS — F4312 Post-traumatic stress disorder, chronic: Secondary | ICD-10-CM | POA: Diagnosis not present

## 2022-05-04 DIAGNOSIS — F4312 Post-traumatic stress disorder, chronic: Secondary | ICD-10-CM | POA: Diagnosis not present

## 2022-05-06 DIAGNOSIS — F4312 Post-traumatic stress disorder, chronic: Secondary | ICD-10-CM | POA: Diagnosis not present

## 2022-05-11 DIAGNOSIS — F4312 Post-traumatic stress disorder, chronic: Secondary | ICD-10-CM | POA: Diagnosis not present

## 2022-05-13 DIAGNOSIS — F4312 Post-traumatic stress disorder, chronic: Secondary | ICD-10-CM | POA: Diagnosis not present

## 2022-05-18 DIAGNOSIS — F4312 Post-traumatic stress disorder, chronic: Secondary | ICD-10-CM | POA: Diagnosis not present

## 2022-05-20 DIAGNOSIS — F4312 Post-traumatic stress disorder, chronic: Secondary | ICD-10-CM | POA: Diagnosis not present

## 2022-05-25 DIAGNOSIS — F4312 Post-traumatic stress disorder, chronic: Secondary | ICD-10-CM | POA: Diagnosis not present

## 2022-05-27 DIAGNOSIS — F4312 Post-traumatic stress disorder, chronic: Secondary | ICD-10-CM | POA: Diagnosis not present

## 2022-06-08 DIAGNOSIS — F4312 Post-traumatic stress disorder, chronic: Secondary | ICD-10-CM | POA: Diagnosis not present

## 2022-06-10 DIAGNOSIS — F4312 Post-traumatic stress disorder, chronic: Secondary | ICD-10-CM | POA: Diagnosis not present

## 2022-06-14 DIAGNOSIS — F4312 Post-traumatic stress disorder, chronic: Secondary | ICD-10-CM | POA: Diagnosis not present

## 2022-06-15 DIAGNOSIS — F4312 Post-traumatic stress disorder, chronic: Secondary | ICD-10-CM | POA: Diagnosis not present

## 2022-06-17 DIAGNOSIS — F4312 Post-traumatic stress disorder, chronic: Secondary | ICD-10-CM | POA: Diagnosis not present

## 2022-06-22 DIAGNOSIS — F4312 Post-traumatic stress disorder, chronic: Secondary | ICD-10-CM | POA: Diagnosis not present

## 2022-06-23 ENCOUNTER — Other Ambulatory Visit: Payer: Self-pay | Admitting: Family Medicine

## 2022-06-23 DIAGNOSIS — E538 Deficiency of other specified B group vitamins: Secondary | ICD-10-CM

## 2022-06-24 DIAGNOSIS — F4312 Post-traumatic stress disorder, chronic: Secondary | ICD-10-CM | POA: Diagnosis not present

## 2022-06-29 DIAGNOSIS — F4312 Post-traumatic stress disorder, chronic: Secondary | ICD-10-CM | POA: Diagnosis not present

## 2022-07-01 DIAGNOSIS — F3176 Bipolar disorder, in full remission, most recent episode depressed: Secondary | ICD-10-CM | POA: Diagnosis not present

## 2022-07-01 DIAGNOSIS — F3174 Bipolar disorder, in full remission, most recent episode manic: Secondary | ICD-10-CM | POA: Diagnosis not present

## 2022-07-01 DIAGNOSIS — F900 Attention-deficit hyperactivity disorder, predominantly inattentive type: Secondary | ICD-10-CM | POA: Diagnosis not present

## 2022-07-01 DIAGNOSIS — F4312 Post-traumatic stress disorder, chronic: Secondary | ICD-10-CM | POA: Diagnosis not present

## 2022-07-06 DIAGNOSIS — F4312 Post-traumatic stress disorder, chronic: Secondary | ICD-10-CM | POA: Diagnosis not present

## 2022-07-08 DIAGNOSIS — F4312 Post-traumatic stress disorder, chronic: Secondary | ICD-10-CM | POA: Diagnosis not present

## 2022-07-13 DIAGNOSIS — F4312 Post-traumatic stress disorder, chronic: Secondary | ICD-10-CM | POA: Diagnosis not present

## 2022-07-14 ENCOUNTER — Encounter: Payer: BC Managed Care – PPO | Admitting: Family Medicine

## 2022-07-15 DIAGNOSIS — F4312 Post-traumatic stress disorder, chronic: Secondary | ICD-10-CM | POA: Diagnosis not present

## 2022-07-20 DIAGNOSIS — F4312 Post-traumatic stress disorder, chronic: Secondary | ICD-10-CM | POA: Diagnosis not present

## 2022-07-22 DIAGNOSIS — F4312 Post-traumatic stress disorder, chronic: Secondary | ICD-10-CM | POA: Diagnosis not present

## 2022-07-27 DIAGNOSIS — F4312 Post-traumatic stress disorder, chronic: Secondary | ICD-10-CM | POA: Diagnosis not present

## 2022-07-29 DIAGNOSIS — F4312 Post-traumatic stress disorder, chronic: Secondary | ICD-10-CM | POA: Diagnosis not present

## 2022-08-03 DIAGNOSIS — F4312 Post-traumatic stress disorder, chronic: Secondary | ICD-10-CM | POA: Diagnosis not present

## 2022-08-05 DIAGNOSIS — F4312 Post-traumatic stress disorder, chronic: Secondary | ICD-10-CM | POA: Diagnosis not present

## 2022-08-10 DIAGNOSIS — F4312 Post-traumatic stress disorder, chronic: Secondary | ICD-10-CM | POA: Diagnosis not present

## 2022-08-12 DIAGNOSIS — F4312 Post-traumatic stress disorder, chronic: Secondary | ICD-10-CM | POA: Diagnosis not present

## 2022-08-17 DIAGNOSIS — F4312 Post-traumatic stress disorder, chronic: Secondary | ICD-10-CM | POA: Diagnosis not present

## 2022-08-19 DIAGNOSIS — F4312 Post-traumatic stress disorder, chronic: Secondary | ICD-10-CM | POA: Diagnosis not present

## 2022-08-25 ENCOUNTER — Telehealth: Payer: Self-pay

## 2022-08-25 NOTE — Patient Outreach (Signed)
  Care Coordination   08/25/2022 Name: Shelby Gallegos MRN: [id number] DOB: [birth year]   Care Coordination Outreach Attempts:  An unsuccessful telephone outreach was attempted today to offer the patient information about available care coordination services as a benefit of their health plan.   Follow Up Plan:  Additional outreach attempts will be made to offer the patient care coordination information and services.   Encounter Outcome:  No Answer  Care Coordination Interventions Activated:  No   Care Coordination Interventions:  No, not indicated    Noreene Larsson RN, MSN, [facility]  Mobile: [phone]

## 2022-08-26 DIAGNOSIS — F4312 Post-traumatic stress disorder, chronic: Secondary | ICD-10-CM | POA: Diagnosis not present

## 2022-08-31 DIAGNOSIS — F4312 Post-traumatic stress disorder, chronic: Secondary | ICD-10-CM | POA: Diagnosis not present

## 2022-09-02 DIAGNOSIS — F4312 Post-traumatic stress disorder, chronic: Secondary | ICD-10-CM | POA: Diagnosis not present

## 2022-09-07 DIAGNOSIS — F4312 Post-traumatic stress disorder, chronic: Secondary | ICD-10-CM | POA: Diagnosis not present

## 2022-09-09 DIAGNOSIS — F4312 Post-traumatic stress disorder, chronic: Secondary | ICD-10-CM | POA: Diagnosis not present

## 2022-09-14 DIAGNOSIS — F4312 Post-traumatic stress disorder, chronic: Secondary | ICD-10-CM | POA: Diagnosis not present

## 2022-09-16 DIAGNOSIS — F4312 Post-traumatic stress disorder, chronic: Secondary | ICD-10-CM | POA: Diagnosis not present

## 2022-09-21 DIAGNOSIS — F4312 Post-traumatic stress disorder, chronic: Secondary | ICD-10-CM | POA: Diagnosis not present

## 2022-09-28 DIAGNOSIS — F4312 Post-traumatic stress disorder, chronic: Secondary | ICD-10-CM | POA: Diagnosis not present

## 2022-09-30 DIAGNOSIS — F4312 Post-traumatic stress disorder, chronic: Secondary | ICD-10-CM | POA: Diagnosis not present

## 2022-10-05 DIAGNOSIS — F4312 Post-traumatic stress disorder, chronic: Secondary | ICD-10-CM | POA: Diagnosis not present

## 2022-10-12 DIAGNOSIS — F4312 Post-traumatic stress disorder, chronic: Secondary | ICD-10-CM | POA: Diagnosis not present

## 2022-10-19 DIAGNOSIS — F4312 Post-traumatic stress disorder, chronic: Secondary | ICD-10-CM | POA: Diagnosis not present

## 2022-10-25 DIAGNOSIS — F4312 Post-traumatic stress disorder, chronic: Secondary | ICD-10-CM | POA: Diagnosis not present

## 2022-11-02 DIAGNOSIS — F4312 Post-traumatic stress disorder, chronic: Secondary | ICD-10-CM | POA: Diagnosis not present

## 2022-11-16 DIAGNOSIS — F4312 Post-traumatic stress disorder, chronic: Secondary | ICD-10-CM | POA: Diagnosis not present

## 2022-11-23 DIAGNOSIS — F4312 Post-traumatic stress disorder, chronic: Secondary | ICD-10-CM | POA: Diagnosis not present

## 2022-11-30 DIAGNOSIS — F4312 Post-traumatic stress disorder, chronic: Secondary | ICD-10-CM | POA: Diagnosis not present

## 2022-12-07 DIAGNOSIS — F4312 Post-traumatic stress disorder, chronic: Secondary | ICD-10-CM | POA: Diagnosis not present

## 2022-12-14 DIAGNOSIS — F4312 Post-traumatic stress disorder, chronic: Secondary | ICD-10-CM | POA: Diagnosis not present

## 2022-12-21 DIAGNOSIS — F4312 Post-traumatic stress disorder, chronic: Secondary | ICD-10-CM | POA: Diagnosis not present

## 2022-12-23 DIAGNOSIS — F3174 Bipolar disorder, in full remission, most recent episode manic: Secondary | ICD-10-CM | POA: Diagnosis not present

## 2022-12-23 DIAGNOSIS — F900 Attention-deficit hyperactivity disorder, predominantly inattentive type: Secondary | ICD-10-CM | POA: Diagnosis not present

## 2022-12-23 DIAGNOSIS — F4310 Post-traumatic stress disorder, unspecified: Secondary | ICD-10-CM | POA: Diagnosis not present

## 2022-12-23 DIAGNOSIS — F3176 Bipolar disorder, in full remission, most recent episode depressed: Secondary | ICD-10-CM | POA: Diagnosis not present

## 2022-12-28 DIAGNOSIS — F4312 Post-traumatic stress disorder, chronic: Secondary | ICD-10-CM | POA: Diagnosis not present

## 2023-01-04 DIAGNOSIS — F4312 Post-traumatic stress disorder, chronic: Secondary | ICD-10-CM | POA: Diagnosis not present

## 2023-01-11 ENCOUNTER — Telehealth: Payer: Self-pay | Admitting: Licensed Clinical Social Worker

## 2023-01-11 DIAGNOSIS — F4312 Post-traumatic stress disorder, chronic: Secondary | ICD-10-CM | POA: Diagnosis not present

## 2023-01-11 NOTE — Telephone Encounter (Signed)
Pt. Lvm for LCSW regarding questions about mental health services and psychiatry.

## 2023-01-18 DIAGNOSIS — F4312 Post-traumatic stress disorder, chronic: Secondary | ICD-10-CM | POA: Diagnosis not present

## 2023-01-25 DIAGNOSIS — F4312 Post-traumatic stress disorder, chronic: Secondary | ICD-10-CM | POA: Diagnosis not present

## 2023-02-01 DIAGNOSIS — F4312 Post-traumatic stress disorder, chronic: Secondary | ICD-10-CM | POA: Diagnosis not present

## 2023-02-09 DIAGNOSIS — F4312 Post-traumatic stress disorder, chronic: Secondary | ICD-10-CM | POA: Diagnosis not present

## 2023-02-14 DIAGNOSIS — F4312 Post-traumatic stress disorder, chronic: Secondary | ICD-10-CM | POA: Diagnosis not present

## 2023-02-23 DIAGNOSIS — F4312 Post-traumatic stress disorder, chronic: Secondary | ICD-10-CM | POA: Diagnosis not present

## 2023-03-01 DIAGNOSIS — F4312 Post-traumatic stress disorder, chronic: Secondary | ICD-10-CM | POA: Diagnosis not present

## 2023-03-08 DIAGNOSIS — F4312 Post-traumatic stress disorder, chronic: Secondary | ICD-10-CM | POA: Diagnosis not present

## 2023-03-15 DIAGNOSIS — F4312 Post-traumatic stress disorder, chronic: Secondary | ICD-10-CM | POA: Diagnosis not present

## 2023-03-16 DIAGNOSIS — F3132 Bipolar disorder, current episode depressed, moderate: Secondary | ICD-10-CM | POA: Diagnosis not present

## 2023-03-16 DIAGNOSIS — F900 Attention-deficit hyperactivity disorder, predominantly inattentive type: Secondary | ICD-10-CM | POA: Diagnosis not present

## 2023-03-16 DIAGNOSIS — F3174 Bipolar disorder, in full remission, most recent episode manic: Secondary | ICD-10-CM | POA: Diagnosis not present

## 2023-03-21 ENCOUNTER — Emergency Department (HOSPITAL_COMMUNITY): Payer: BC Managed Care – PPO

## 2023-03-21 ENCOUNTER — Emergency Department (HOSPITAL_COMMUNITY)
Admission: EM | Admit: 2023-03-21 | Discharge: 2023-03-21 | Disposition: A | Discharge status: Home / Self Care | Payer: BC Managed Care – PPO | Attending: Emergency Medicine | Admitting: Emergency Medicine

## 2023-03-21 ENCOUNTER — Encounter (HOSPITAL_COMMUNITY): Payer: Self-pay

## 2023-03-21 DIAGNOSIS — R569 Unspecified convulsions: Secondary | ICD-10-CM | POA: Diagnosis not present

## 2023-03-21 DIAGNOSIS — R9431 Abnormal electrocardiogram [ECG] [EKG]: Secondary | ICD-10-CM | POA: Diagnosis not present

## 2023-03-21 DIAGNOSIS — G43909 Migraine, unspecified, not intractable, without status migrainosus: Secondary | ICD-10-CM | POA: Diagnosis not present

## 2023-03-21 DIAGNOSIS — R29818 Other symptoms and signs involving the nervous system: Secondary | ICD-10-CM | POA: Diagnosis not present

## 2023-03-21 DIAGNOSIS — R519 Headache, unspecified: Secondary | ICD-10-CM | POA: Diagnosis not present

## 2023-03-21 LAB — CBC
HCT: 40.2 % (ref 36.0–46.0)
Hemoglobin: 12.9 g/dL (ref 12.0–15.0)
MCH: 29.3 pg (ref 26.0–34.0)
MCHC: 32.1 g/dL (ref 30.0–36.0)
MCV: 91.2 fL (ref 80.0–100.0)
Platelets: 389 10*3/uL (ref 150–400)
RBC: 4.41 MIL/uL (ref 3.87–5.11)
RDW: 13.7 % (ref 11.5–15.5)
WBC: 10.4 10*3/uL (ref 4.0–10.5)
nRBC: 0 % (ref 0.0–0.2)

## 2023-03-21 LAB — COMPREHENSIVE METABOLIC PANEL
ALT: 18 U/L (ref 0–44)
AST: 19 U/L (ref 15–41)
Albumin: 3.7 g/dL (ref 3.5–5.0)
Alkaline Phosphatase: 73 U/L (ref 38–126)
Anion gap: 12 (ref 5–15)
BUN: 7 mg/dL (ref 6–20)
CO2: 23 mmol/L (ref 22–32)
Calcium: 8.8 mg/dL — ABNORMAL LOW (ref 8.9–10.3)
Chloride: 105 mmol/L (ref 98–111)
Creatinine, Ser: 1.12 mg/dL — ABNORMAL HIGH (ref 0.44–1.00)
GFR, Estimated: >60 mL/min (ref >60)
Glucose, Bld: 86 mg/dL (ref 70–99)
Potassium: 3.5 mmol/L (ref 3.5–5.1)
Sodium: 140 mmol/L (ref 135–145)
Total Bilirubin: 0.3 mg/dL (ref 0.3–1.2)
Total Protein: 7.1 g/dL (ref 6.5–8.1)

## 2023-03-21 LAB — URINALYSIS, ROUTINE W REFLEX MICROSCOPIC
Bilirubin Urine: NEGATIVE
Glucose, UA: NEGATIVE mg/dL
Ketones, ur: NEGATIVE mg/dL
Nitrite: NEGATIVE
Protein, ur: NEGATIVE mg/dL
Specific Gravity, Urine: 1.008 (ref 1.005–1.030)
pH: 5 (ref 5.0–8.0)

## 2023-03-21 LAB — I-STAT BETA HCG BLOOD, ED (MC, WL, AP ONLY): I-stat hCG, quantitative: <5 m[IU]/mL (ref <5)

## 2023-03-21 MED ORDER — KETOROLAC TROMETHAMINE 15 MG/ML IJ SOLN
15.0000 mg | Freq: Once | INTRAMUSCULAR | Status: AC
  Administered 2023-03-21: 15 mg via INTRAVENOUS
  Filled 2023-03-21: qty 1

## 2023-03-21 MED ORDER — GADOBUTROL 1 MMOL/ML IV SOLN
10.0000 mL | Freq: Once | INTRAVENOUS | Status: AC | PRN
  Administered 2023-03-21: 10 mL via INTRAVENOUS

## 2023-03-21 MED ORDER — LORAZEPAM 1 MG PO TABS: 0.5000 mg | ORAL_TABLET | ORAL | Status: DC | PRN

## 2023-03-21 MED ORDER — DEXAMETHASONE SODIUM PHOSPHATE 10 MG/ML IJ SOLN
8.0000 mg | Freq: Once | INTRAMUSCULAR | Status: AC
  Administered 2023-03-21: 8 mg via INTRAVENOUS
  Filled 2023-03-21: qty 1

## 2023-03-21 MED ORDER — FOSFOMYCIN TROMETHAMINE 3 G PO PACK
3.0000 g | PACK | Freq: Once | ORAL | Status: AC
  Administered 2023-03-21: 3 g via ORAL
  Filled 2023-03-21: qty 3

## 2023-03-21 MED ORDER — LORAZEPAM 1 MG PO TABS: 1.0000 mg | ORAL_TABLET | ORAL | Status: DC | PRN

## 2023-03-21 MED ORDER — DIPHENHYDRAMINE HCL 50 MG/ML IJ SOLN
25.0000 mg | Freq: Once | INTRAMUSCULAR | Status: AC
  Administered 2023-03-21: 25 mg via INTRAVENOUS
  Filled 2023-03-21: qty 1

## 2023-03-21 MED ORDER — PROCHLORPERAZINE EDISYLATE 10 MG/2ML IJ SOLN
10.0000 mg | INTRAMUSCULAR | Status: AC
  Administered 2023-03-21: 10 mg via INTRAVENOUS
  Filled 2023-03-21: qty 2

## 2023-03-21 NOTE — Discharge Instructions (Signed)
Please return to the ED with any new or worsening signs or symptoms Please read the attached guide concerning migraine headaches Please follow-up with neurology.  I have placed an ambulatory referral.  They will reach out to you in the next 2 days to schedule an appointment.  If this does not occur, please use the contact information provided on this form to reach out to them.

## 2023-03-21 NOTE — ED Provider Triage Note (Signed)
Emergency Medicine Provider Triage Evaluation Note  Shakyia Bosso , a 42 y.o. female  was evaluated in triage.  Pt complains of headache and LOC.  Long history of migraines with [place].  States she has had a migraine for the last 3 days.  On Saturday coworker noted that she "blacked out" while at work.  She was lying flat on the ground and was unconscious.  This lasted for a couple of minutes when she returned to consciousness coworker noted that she was confused and then 15 minutes later had another moment of LOC.  She returned to baseline later that evening.  This morning still complaining of a headache and had another occurrence of LOC where she was unresponsive.  Headache is primarily located behind the right eye.  Denies visual disturbance denies fever.  Denies chest pain shortness of breath.  Review of Systems  Positive: See above Negative: See above  Physical Exam  BP (!) 146/118 (BP Location: Right Arm)   Pulse (!) 110   Temp 99 F (37.2 C)   Resp 18   SpO2 98%  Gen:   Awake, no distress   Resp:  Normal effort  MSK:   Moves extremities without difficulty  Other:    Medical Decision Making  Medically screening exam initiated at 2:40 PM.  Appropriate orders placed.  [name] was informed that the remainder of the evaluation will be completed by another provider, this initial triage assessment does not replace that evaluation, and the importance of remaining in the ED until their evaluation is complete.  Work up started   Gareth Eagle, PA-C 03/21/23 1442

## 2023-03-21 NOTE — ED Provider Notes (Signed)
[place] EMERGENCY DEPARTMENT AT [facility] Provider Note   CSN: [id number] Arrival date & time: 03/21/23  1336     History  Chief Complaint  Patient presents with   Migraine    Shelby Gallegos is a 42 y.o. female with medical history of ADHD, bipolar 1 disorder, migraine.  Patient presents ED for evaluation of 40 migraine, "blacking out".  Patient here with her mother who provides some history.  Patient mother reports that the patient has had migraines ever since she was 42 years old.  Patient mother reports that the patient was on abortive medication at 1 point however was taken off of this by her neurologist.  Patient does not have a neurologist currently, she is [facility] in [facility].  Patient reports that for the last 4 days she has had right-sided headache that will come and go.  Patient reports she has been taking Excedrin Migraine at home which will slightly relieve symptoms however headache always returns.  Patient mother reports that today patient had a "blackout" spell where she was very confused and weak and seemingly disoriented.  The patient mother called a doctor who she cannot tell me the name of and this doctor advised the patient mother that the patient could be suffering from seizures and they advised her to come to the ED for evaluation.  The patient and the patient mother denies any tongue biting, no urinary incontinence, no postictal confusion.  Patient denies any history of seizures.  Patient denies any one-sided weakness or numbness, fevers, chest pain, shortness of breath, abdominal pain.  The patient is endorsing photosensitivity as well as nausea.   Migraine Associated symptoms include headaches.       Home Medications Prior to Admission medications   Medication Sig Start Date End Date Taking? Authorizing Provider  ALPRAZolam Prudy Feeler) 0.5 MG tablet Take 0.5 mg by mouth as needed. 08/28/20   [provider]   amphetamine-dextroamphetamine (ADDERALL) 20 MG tablet Take 20 mg by mouth 3 (three) times daily.    [provider]  aspirin-acetaminophen-caffeine (EXCEDRIN MIGRAINE) [phone] MG tablet Take 1 tablet by mouth as needed.    [provider]  cyanocobalamin (VITAMIN B12) 1000 MCG/ML injection Inject 1 mL (1,000 mcg total) into the muscle once for 1 dose. Inject 1 mL into the muscle once a week for 3 weeks and then once monthly 06/23/22   Bosie Clos, MD  Erenumab-aooe 70 MG/ML SOAJ Inject 70 mg into the skin every 30 (thirty) days.    [provider]  gabapentin (NEURONTIN) 400 MG capsule Take 400 mg by mouth 4 (four) times daily. 02/26/19   [provider]  lamoTRIgine (LAMICTAL) 150 MG tablet Take 150 mg by mouth 2 (two) times daily. 12/21/18   [provider]  levothyroxine (SYNTHROID) 75 MCG tablet Take 1 tablet (75 mcg total) by mouth daily before breakfast. 06/09/20   Bosie Clos, MD  OXcarbazepine (TRILEPTAL) 150 MG tablet Take 1 tablet (150 mg total) by mouth at bedtime. 02/26/19   Oneta Rack, NP  prazosin (MINIPRESS) 2 MG capsule Take 2-4 mg by mouth at bedtime.    [provider]  SUMAtriptan 6 MG/0.5ML SOAJ Inject 6 mLs into the muscle as needed. 03/24/20   [provider]  topiramate (TOPAMAX) 100 MG tablet  05/19/17   [provider]  VRAYLAR capsule Take 1.5 mg by mouth daily. 06/04/20   [provider]  Allergies    Patient has no known allergies.    Review of Systems   Review of Systems  Eyes:  Positive for photophobia.  Gastrointestinal:  Positive for nausea.  Neurological:  Positive for syncope and headaches. Negative for seizures.  All other systems reviewed and are negative.   Physical Exam Updated Vital Signs BP (!) 146/118 (BP Location: Right Arm)   Pulse (!) 110   Temp 99 F (37.2 C)   Resp 18   SpO2 98%  Physical Exam Vitals and nursing note reviewed.   Constitutional:      General: She is not in acute distress.    Appearance: Normal appearance. She is not ill-appearing, toxic-appearing or diaphoretic.  HENT:     Head: Normocephalic and atraumatic.     Nose: Nose normal.     Mouth/Throat:     Mouth: Mucous membranes are moist.     Pharynx: Oropharynx is clear.  Eyes:     Extraocular Movements: Extraocular movements intact.     Conjunctiva/sclera: Conjunctivae normal.     Pupils: Pupils are equal, round, and reactive to light.  Cardiovascular:     Rate and Rhythm: Normal rate and regular rhythm.  Pulmonary:     Effort: Pulmonary effort is normal.     Breath sounds: Normal breath sounds. No wheezing.  Abdominal:     General: Abdomen is flat. Bowel sounds are normal.     Palpations: Abdomen is soft.     Tenderness: There is no abdominal tenderness.  Musculoskeletal:     Cervical back: Normal range of motion and neck supple. No tenderness.  Skin:    General: Skin is warm and dry.     Capillary Refill: Capillary refill takes less than 2 seconds.  Neurological:     General: No focal deficit present.     Mental Status: She is alert and oriented to person, place, and time.     GCS: GCS eye subscore is 4. GCS verbal subscore is 5. GCS motor subscore is 6.     Cranial Nerves: Cranial nerves 2-12 are intact. No cranial nerve deficit.     Sensory: Sensation is intact. No sensory deficit.     Motor: Motor function is intact. No weakness.     Coordination: Coordination is intact. Coordination normal.     Comments: Intact finger-nose, heel-to-shin.  Cranial nerves II through XII intact.  No pronator drift, no slurred speech, no facial droop.  5 out of 5 strength bilateral lower and upper extremities.     ED Results / Procedures / Treatments   Labs (all labs ordered are listed, but only abnormal results are displayed) Labs Reviewed  COMPREHENSIVE METABOLIC PANEL - Abnormal; Notable for the following components:      Result Value    Creatinine, Ser 1.12 (*)    Calcium 8.8 (*)    All other components within normal limits  URINALYSIS, ROUTINE W REFLEX MICROSCOPIC - Abnormal; Notable for the following components:   APPearance HAZY (*)    Hgb urine dipstick SMALL (*)    Leukocytes,Ua MODERATE (*)    Bacteria, UA RARE (*)    All other components within normal limits  CBC  I-STAT BETA HCG BLOOD, ED ([facility], [facility], [facility] ONLY)    EKG None  Radiology CT Head Wo Contrast  Result Date: 03/21/2023 CLINICAL DATA:  Headache, neuro deficit EXAM: CT HEAD WITHOUT CONTRAST TECHNIQUE: Contiguous axial images were obtained from the base of the skull through the vertex without intravenous contrast.  RADIATION DOSE REDUCTION: This exam was performed according to the departmental dose-optimization program which includes automated exposure control, adjustment of the mA and/or kV according to patient size and/or use of iterative reconstruction technique. COMPARISON:  None Available. FINDINGS: Brain: No evidence of acute infarction, hemorrhage, hydrocephalus, extra-axial collection or mass lesion/mass effect. Vascular: No hyperdense vessel identified. Skull: No acute fracture. Sinuses/Orbits: Clear sinuses.  No acute orbital findings Other: No mastoid effusions. IMPRESSION: No evidence of acute intracranial abnormality. Electronically Signed   By: Feliberto Harts M.D.   On: 03/21/2023 15:05    Procedures Procedures   Medications Ordered in ED Medications  prochlorperazine (COMPAZINE) injection 10 mg (has no administration in time range)  diphenhydrAMINE (BENADRYL) injection 25 mg (has no administration in time range)  ketorolac (TORADOL) 15 MG/ML injection 15 mg (has no administration in time range)  dexamethasone (DECADRON) injection 8 mg (has no administration in time range)    ED Course/ Medical Decision Making/ A&P  Medical Decision Making  42 year old female presents ED for evaluation.  Please see HPI for further details.  Patient lab  work initiated in triage include CBC without leukocytosis or anemia.  CMP with baseline creatinine, no other electrolyte derangement.  Urinalysis shows moderate leukocytes, rare bacteria however patient denies any dysuria.  Patient CT head without contrast shows no evidence of intracranial abnormality.  Patient has show no seizure-like activity here.  Patient alert and oriented x 3.  Patient reports she talked to a doctor through phone who reports that she was concerned about seizures however patient denies history of seizures, has show no seizure-like activity here.  Patient will be given migraine cocktail to include Decadron, Benadryl, Toradol, Compazine.  Patient has had an ambulatory referral to neurology placed.  The patient will be signed out to my attending, Dr. Karene Fry, pending reevaluation after migraine cocktail.  Plan of management discussed.  Patient amenable to plan.  Patient stable at time of shift handoff.   Final Clinical Impression(s) / ED Diagnoses Final diagnoses:  Bad headache    Rx / DC Orders ED Discharge Orders          Ordered    Ambulatory referral to Neurology       Comments: An appointment is requested in approximately: 1 week   03/21/23 1832              Clent Ridges 03/21/23 Channing Mutters, MD 03/21/23 2122

## 2023-03-21 NOTE — ED Provider Notes (Signed)
  Physical Exam  BP (!) 146/118 (BP Location: Right Arm)   Pulse (!) 110   Temp 99 F (37.2 C)   Resp 18   SpO2 98%   Physical Exam  Procedures  Procedures  ED Course / MDM    Medical Decision Making Amount and/or Complexity of Data Reviewed Radiology: ordered.  Risk Prescription drug management.   47F presenting with migraine headache, consistent with prior migraine headache. Near syncope vs syncopal episode today. Has not been seen by a neurologist in over two years. No red flag symptoms. Likely reassess after migraine cocktail and discharge.   Discussed the patient with her mother bedside.  She describes a 5 to 6-minute episode of unresponsiveness, following this she had a roughly 20 to 30-minute period that appeared to be postictal with some confusion.  No prior history of seizures.  Reviewed laboratory workup which is unremarkable.  The patient is mildly somnolent but otherwise symptomatically improved following a migraine cocktail.  Discussed care of the patient with Dr. Tempie Hoist who recommended MRI of the brain with and without contrast and if reassuring, okay for outpatient follow-up.  Reviewed, the patient did have evidence of UTI with minimal symptoms, will administer a one-time dose of fosfomycin.  MRI brain: IMPRESSION:  Normal brain MRI.  No acute intracranial abnormality.    Patient overall stable for discharge and outpatient neurology follow-up.       Ernie Avena, MD 03/21/23 2244

## 2023-03-21 NOTE — ED Triage Notes (Signed)
Pt c/o R sided HA, witnessed +LOC by coworker. Hx migraines, auras, endorses same x3days. Denies visual changes

## 2023-03-22 ENCOUNTER — Ambulatory Visit: Payer: BC Managed Care – PPO | Admitting: Physician Assistant

## 2023-03-22 DIAGNOSIS — F4312 Post-traumatic stress disorder, chronic: Secondary | ICD-10-CM | POA: Diagnosis not present

## 2023-03-22 NOTE — Progress Notes (Deleted)
      Established patient visit   Patient: Shelby Gallegos   DOB: Aug 19, 1981   42 y.o. Female  MRN: [id number] Visit Date: 03/22/2023  Today's healthcare provider: [name], PA-C   No chief complaint on file.  Subjective    HPI  ***  Medications: Outpatient Medications Prior to Visit  Medication Sig   ALPRAZolam (XANAX) 0.5 MG tablet Take 0.5 mg by mouth daily as needed for anxiety.   amphetamine-dextroamphetamine (ADDERALL) 20 MG tablet Take 20 mg by mouth 3 (three) times daily.   aspirin-acetaminophen-caffeine (EXCEDRIN MIGRAINE) 250-250-65 MG tablet Take 1 tablet by mouth every 6 (six) hours as needed for migraine.   cariprazine (VRAYLAR) 3 MG capsule Take 3 mg by mouth every evening.   cyanocobalamin (VITAMIN B12) 1000 MCG/ML injection Inject 1 mL (1,000 mcg total) into the muscle once for 1 dose. Inject 1 mL into the muscle once a week for 3 weeks and then once monthly (Patient taking differently: Inject 1,000 mcg into the skin every 30 (thirty) days.)   lamoTRIgine (LAMICTAL) 200 MG tablet Take 200 mg by mouth daily.   levothyroxine (SYNTHROID) 75 MCG tablet Take 1 tablet (75 mcg total) by mouth daily before breakfast. (Patient not taking: Reported on 03/21/2023)   OXcarbazepine (TRILEPTAL) 150 MG tablet Take 1 tablet (150 mg total) by mouth at bedtime.   prazosin (MINIPRESS) 2 MG capsule Take 2-4 mg by mouth daily as needed (For nightmares).   No facility-administered medications prior to visit.    Review of Systems  {Labs  Heme  Chem  Endocrine  Serology  Results Review (optional):23779}   Objective    LMP  (LMP Unknown)  {Show previous vital signs (optional):23777}  Physical Exam  ***  No results found for any visits on 03/22/23.  Assessment & Plan     ***  No follow-ups on file.      {provider attestation***:1}   [name], PA-C  [facility] [phone] (phone) [phone] (fax)  [facility]

## 2023-03-23 ENCOUNTER — Telehealth: Payer: Self-pay | Admitting: Neurology

## 2023-03-23 ENCOUNTER — Ambulatory Visit: Payer: BC Managed Care – PPO | Admitting: Neurology

## 2023-03-23 ENCOUNTER — Encounter: Payer: Self-pay | Admitting: Neurology

## 2023-03-23 VITALS — BP 148/85 | HR 88 | Ht 67.5 in | Wt 238.0 lb

## 2023-03-23 DIAGNOSIS — G43E09 Chronic migraine with aura, not intractable, without status migrainosus: Secondary | ICD-10-CM | POA: Diagnosis not present

## 2023-03-23 MED ORDER — NURTEC 75 MG PO TBDP: 75.0000 mg | ORAL_TABLET | ORAL | 6 refills | Status: DC

## 2023-03-23 MED ORDER — AIMOVIG 140 MG/ML ~~LOC~~ SOAJ: 140.0000 mg | SUBCUTANEOUS | 11 refills | Status: AC

## 2023-03-23 MED ORDER — SUMATRIPTAN SUCCINATE 6 MG/0.5ML ~~LOC~~ SOAJ: 6.0000 mg | SUBCUTANEOUS | 3 refills | Status: DC | PRN

## 2023-03-23 NOTE — Addendum Note (Signed)
Addended byWindell Norfolk on: 03/23/2023 05:19 PM   Modules accepted: Orders

## 2023-03-23 NOTE — Patient Instructions (Addendum)
Start Aimovig as preventive medication  Nurtec as abortive medication  Sumatriptan IM as abortive  Increase your water intake  Get plenty of sleep  Follow up in 4 to 5 months or sooner if worse

## 2023-03-23 NOTE — Telephone Encounter (Signed)
Pt states out of the 3 medications called in by Dr Teresa Coombs, the Sumitriptan was never called in.  Pt would like to know if Dr Teresa Coombs decided to not prescribe it or if something else is going on. Pt also states she was told by pharmacy that the insurance denied the  Rimegepant Sulfate (NURTEC) 75 MG TBDP

## 2023-03-23 NOTE — Telephone Encounter (Signed)
Call to patient. No answer, left voicemail to inform patient that prescriptions (aimovig, sumatriptan, and nurtec were sent to pharmacy today.  Advised to call office with any questions or concerns.

## 2023-03-23 NOTE — Telephone Encounter (Signed)
Called and spoke to pt I explained that the nurtec and aimovig will more than likely need pas completed so I will route to the prior auth team. She also inquired why the imitrex wasn't sent in when she stated Dr. Teresa Coombs verbally told her in the visit that he would send that too.  Routing to rxprior[facility]hte[facility] and dr. Teresa Coombs

## 2023-03-23 NOTE — Telephone Encounter (Signed)
Will sent the Imitrex IM.

## 2023-03-23 NOTE — Progress Notes (Addendum)
[facility]  PATIENT: Shelby Gallegos DOB: 07/27/1981  REQUESTING CLINICIAN: Al Decant, P* HISTORY FROM: Patient  REASON FOR VISIT: Migraine headaches    HISTORICAL  CHIEF COMPLAINT:  Chief Complaint  Patient presents with   New Patient (Initial Visit)    RM 12, ALONE MIGRAINE:5 IN THE PAST 7 DAYS. INTERMITTENT  [facility] FOLLOW UP 2 DAYS AGO    HISTORY OF PRESENT ILLNESS:  This is a 42 year old woman past medical history of bipolar disorder, chronic migraines, suicide attempt who is presenting to establish care.  Patient was previously managed by Dr. Sharene Skeans at [facility] but since she retired she has not seen neurology for the past 3 to 4 years.  She reported having a history of migraine as a kid.  Migraine have been manageable, she reports actually for the past 3 years, they have been tolerable but recently in the past few month migraine frequency has worsened.  Since Dr. Sharene Skeans retired, she has not been on a preventative medication.  In the past she had tried multiple preventative medication including amitriptyline, topiramate, Depakote, lamotrigine, Cymbalta, Effexor, Prozac and Wellbutrin.  She also tried Neurontin, NUrtec and Aimovig.  As abortive medication she tried triptan including sumatriptan and Zomig but again for the past 2-3 she has not been on any preventive or abortive medicine.  She reports 2 days ago she was seen in the ED after syncopal episode.  Her workup was negative.  In the ED she developed a severe migraine.  She was given a migraine cocktail with improvement of her pain. She reports with her migraine she does have aura, seizes zig zag line  on her left peripheral vision and then 30 minutes later the headaches will start.  She does have migrainous features including nausea vomiting tingling on her face.   Headache History and Characteristics: Onset: As a child  Location: Above right eye, occasional front  Quality:  Sharp  pressure, stabbing pain Intensity: 9-10/10.  Duration: All done  Migrainous Features: Photophobia, phonophobia, nausea, vomiting.  Aura: Yes, zigzag line in the left peripheral visual field  History of brain injury or tumor: No  Family history: Motion sickness: no Cardiac history: no  OTC: tylenol, codeine, Excedrin, Fioricet  Sleep:  Mood/ Stress: Stable   Prior prophylaxis: Propranolol: No Verapamil:No TCA: Yes  Topamax: Yes Depakote: Yes, also tried Lamotrigine  Effexor: No Cymbalta: Yes, also tried Effexor, Prozac and Wellbutrin  Neurontin:Yes cGRP: Aimovig   Prior abortives: Triptan: Yes Anti-emetic: No Steroids: No Ergotamine suppository: No    OTHER MEDICAL CONDITIONS: Bipolar disorder, Chronic Migraines    REVIEW OF SYSTEMS: Full 14 system review of systems performed and negative with exception of: As noted in the HPI   ALLERGIES: No Known Allergies  HOME MEDICATIONS: Outpatient Medications Prior to Visit  Medication Sig Dispense Refill   ALPRAZolam (XANAX) 0.5 MG tablet Take 0.5 mg by mouth daily as needed for anxiety.     amphetamine-dextroamphetamine (ADDERALL) 20 MG tablet Take 20 mg by mouth 3 (three) times daily.     aspirin-acetaminophen-caffeine (EXCEDRIN MIGRAINE) 250-250-65 MG tablet Take 1 tablet by mouth every 6 (six) hours as needed for migraine.     cariprazine (VRAYLAR) 3 MG capsule Take 1.5 mg by mouth every evening.     cyanocobalamin (VITAMIN B12) 1000 MCG/ML injection Inject 1 mL (1,000 mcg total) into the muscle once for 1 dose. Inject 1 mL into the muscle once a week for 3 weeks and then once monthly (  Patient taking differently: Inject 1,000 mcg into the skin every 30 (thirty) days.) 4 mL 5   lamoTRIgine (LAMICTAL) 200 MG tablet Take 400 mg by mouth daily.     levothyroxine (SYNTHROID) 75 MCG tablet Take 1 tablet (75 mcg total) by mouth daily before breakfast. 30 tablet 5   lumateperone tosylate (CAPLYTA) 42 MG capsule Take 42 mg by  mouth daily.     OXcarbazepine (TRILEPTAL) 150 MG tablet Take 1 tablet (150 mg total) by mouth at bedtime. 30 tablet 0   prazosin (MINIPRESS) 2 MG capsule Take 2-4 mg by mouth daily as needed (For nightmares).     No facility-administered medications prior to visit.    PAST MEDICAL HISTORY: Past Medical History:  Diagnosis Date   ADHD (attention deficit hyperactivity disorder)    Bipolar 1 disorder    Migraine    Suicide attempt by drug overdose 02/15/2019    PAST SURGICAL HISTORY: Past Surgical History:  Procedure Laterality Date   NO PAST SURGERIES     TENDON REPAIR Bilateral 02/20/2019   Procedure: TENDON REPAIR BILATERAL FOREARMS;  Surgeon: [name], MD;  Location: [facility];  Service: Orthopedics;  Laterality: Bilateral;   WOUND EXPLORATION Bilateral 02/20/2019   Procedure: WOUND EXPLORATION;  Surgeon: [name], MD;  Location: [facility];  Service: Orthopedics;  Laterality: Bilateral;    FAMILY HISTORY: Family History  Problem Relation Age of Onset   Atrial fibrillation Mother    Bipolar disorder Paternal Uncle    Alcohol abuse Paternal Uncle    Depression Paternal Uncle    OCD Paternal Uncle     SOCIAL HISTORY: Social History   Socioeconomic History   Marital status: Single    Spouse name: Not on file   Number of children: 0   Years of education: Not on file   Highest education level: Not on file  Occupational History   Not on file  Tobacco Use   Smoking status: Never   Smokeless tobacco: Never  Vaping Use   Vaping Use: Never used  Substance and Sexual Activity   Alcohol use: No   Drug use: No   Sexual activity: Not Currently    Birth control/protection: None  Other Topics Concern   Not on file  Social History Narrative   Not on file   Social Determinants of Health   Financial Resource Strain: Low Risk  (04/10/2019)   Overall Financial Resource Strain (CARDIA)    Difficulty of Paying Living Expenses: Not very  hard  Food Insecurity: No Food Insecurity (04/10/2019)   Hunger Vital Sign    Worried About Running Out of Food in the Last Year: Never true    Ran Out of Food in the Last Year: Never true  Transportation Needs: No Transportation Needs (04/10/2019)   PRAPARE - Administrator, Civil Service (Medical): No    Lack of Transportation (Non-Medical): No  Physical Activity: Insufficiently Active (04/10/2019)   Exercise Vital Sign    Days of Exercise per Week: 2 days    Minutes of Exercise per Session: 30 min  Stress: Stress Concern Present (04/10/2019)   [employer] of Occupational Health - Occupational Stress Questionnaire    Feeling of Stress : Rather much  Social Connections: Moderately Integrated (04/10/2019)   Social Connection and Isolation Panel [NHANES]    Frequency of Communication with Friends and Family: More than three times a week    Frequency of Social Gatherings with Friends and Family: Once a  week    Attends Religious Services: More than 4 times per year    Active Member of Clubs or Organizations: Yes    Attends Banker Meetings: More than 4 times per year    Marital Status: Never married  Intimate Partner Violence: Not At Risk (04/10/2019)   Humiliation, Afraid, Rape, and Kick questionnaire    Fear of Current or Ex-Partner: No    Emotionally Abused: No    Physically Abused: No    Sexually Abused: No    PHYSICAL EXAM  GENERAL EXAM/CONSTITUTIONAL: Vitals:  Vitals:   03/23/23 1402  BP: (!) 148/85  Pulse: 88  Weight: 238 lb (108 kg)  Height: 5' 7.5" (1.715 m)   Body mass index is 36.73 kg/m. Wt Readings from Last 3 Encounters:  03/23/23 238 lb (108 kg)  01/14/22 242 lb (109.8 kg)  01/13/21 236 lb (107 kg)   Patient is in no distress; well developed, nourished and groomed; neck is supple  EYES: Pupils round and reactive to light, Visual fields full to confrontation, Extraocular movements intacts,   MUSCULOSKELETAL: Gait, strength,  tone, movements noted in Neurologic exam below  NEUROLOGIC: MENTAL STATUS:      No data to display         awake, alert, oriented to person, place and time recent and remote memory intact normal attention and concentration language fluent, comprehension intact, naming intact fund of knowledge appropriate  CRANIAL NERVE:  2nd - no papilledema or hemorrhages on fundoscopic exam 2nd, 3rd, 4th, 6th - pupils equal and reactive to light, visual fields full to confrontation, extraocular muscles intact, no nystagmus 5th - facial sensation symmetric 7th - facial strength symmetric 8th - hearing intact 9th - palate elevates symmetrically, uvula midline 11th - shoulder shrug symmetric 12th - tongue protrusion midline  MOTOR:  normal bulk and tone, full strength in the BUE, BLE  SENSORY:  normal and symmetric to light touch, pinprick  COORDINATION:  finger-nose-finger, fine finger movements normal  GAIT/STATION:  normal    DIAGNOSTIC DATA (LABS, IMAGING, TESTING) - I reviewed patient records, labs, notes, testing and imaging myself where available.  Lab Results  Component Value Date   WBC 10.4 03/21/2023   HGB 12.9 03/21/2023   HCT 40.2 03/21/2023   MCV 91.2 03/21/2023   PLT 389 03/21/2023      Component Value Date/Time   NA 140 03/21/2023 1450   NA 141 09/11/2020 0940   K 3.5 03/21/2023 1450   CL 105 03/21/2023 1450   CO2 23 03/21/2023 1450   GLUCOSE 86 03/21/2023 1450   BUN 7 03/21/2023 1450   BUN 9 09/11/2020 0940   CREATININE 1.12 (H) 03/21/2023 1450   CALCIUM 8.8 (L) 03/21/2023 1450   PROT 7.1 03/21/2023 1450   PROT 6.5 09/11/2020 0940   ALBUMIN 3.7 03/21/2023 1450   ALBUMIN 4.3 09/11/2020 0940   AST 19 03/21/2023 1450   ALT 18 03/21/2023 1450   ALKPHOS 73 03/21/2023 1450   BILITOT 0.3 03/21/2023 1450   BILITOT <0.2 09/11/2020 0940   GFRNONAA >60 03/21/2023 1450   GFRAA 72 09/11/2020 0940   Lab Results  Component Value Date   CHOL 220 (H)  05/13/2020   HDL 58 05/13/2020   LDLCALC 146 (H) 05/13/2020   TRIG 78 05/13/2020   CHOLHDL 3.8 05/13/2020   Lab Results  Component Value Date   HGBA1C 6.0 (H) 01/14/2022   Lab Results  Component Value Date   VITAMINB12 900 09/11/2020   Lab  Results  Component Value Date   TSH 2.240 01/14/2022    MRI Brain 03/21/2023 Normal brain MRI. No acute intracranial abnormality.    ASSESSMENT AND PLAN  42 y.o. year old female with history of bipolar disorder, migraine headaches presenting to establish care for her migraines.  Her migraines was previously managed by Dr. Sharene Skeans but since she retired, send patient needs a new neurologist.  She reports trying multiple preventative and abortive medication as noted in the HPI and Nurtec and Aimovig gave her the best relief.  Plan will be for patient to restart Aimovig and Nurtec.  I have provided her samples in the office.  We will continue to follow her, I will see her in 3 to 4 months for follow-up.  She voices understanding.   1. Chronic migraine with aura without status migrainosus, not intractable     Patient Instructions  Start Aimovig as preventive medication  Nurtec as abortive medication  Sumatriptan IM as abortive  Increase your water intake  Get plenty of sleep  Follow up in 4 to 5 months or sooner if worse     No orders of the defined types were placed in this encounter.   Meds ordered this encounter  Medications   Rimegepant Sulfate (NURTEC) 75 MG TBDP    Sig: Take 1 tablet (75 mg total) by mouth every other day.    Dispense:  16 tablet    Refill:  6   Erenumab-aooe (AIMOVIG) 140 MG/ML SOAJ    Sig: Inject 140 mg into the skin every 30 (thirty) days.    Dispense:  1.12 mL    Refill:  11   SUMAtriptan 6 MG/0.5ML SOAJ    Sig: Inject 6 mg into the skin as needed.    Dispense:  15 mL    Refill:  3    Return in about 4 months (around 07/23/2023).    Windell Norfolk, MD 03/23/2023, 5:18 PM  [facility] [address], [address] [place], [place] [address] [phone]   ADDENDUM 04/06/2023 Chronic migraines, 4 to 5 headaches days per week. Migraines can last the entire day up to 3 days.   Windell Norfolk, MD

## 2023-03-29 DIAGNOSIS — F4312 Post-traumatic stress disorder, chronic: Secondary | ICD-10-CM | POA: Diagnosis not present

## 2023-03-29 NOTE — Progress Notes (Unsigned)
I,Sha'taria Tyson,acting as a Neurosurgeon for [employer], PA-C.,have documented all relevant documentation on the behalf of [name], PA-C,as directed by  [name], PA-C while in the presence of [name], PA-C.   Complete physical exam   Patient: Shelby Gallegos   DOB: [birth year]   42 y.o. Female  MRN: [id number] Visit Date: 03/30/2023  Today's healthcare provider: [name], PA-C   No chief complaint on file.  Subjective    Shelby Gallegos is a 42 y.o. female who presents today for a complete physical exam.  She reports consuming a {diet types:17450} diet. {Exercise:19826} She generally feels {well/fairly well/poorly:18703}. She reports sleeping {well/fairly well/poorly:18703}. She {does/does not:200015} have additional problems to discuss today.  HPI  ***  Past Medical History:  Diagnosis Date   ADHD (attention deficit hyperactivity disorder)    Bipolar 1 disorder (HCC)    Migraine    Suicide attempt by drug overdose (HCC) 02/15/2019   Past Surgical History:  Procedure Laterality Date   NO PAST SURGERIES     TENDON REPAIR Bilateral 02/20/2019   Procedure: TENDON REPAIR BILATERAL FOREARMS;  Surgeon: [name], MD;  Location: [facility];  Service: Orthopedics;  Laterality: Bilateral;   WOUND EXPLORATION Bilateral 02/20/2019   Procedure: WOUND EXPLORATION;  Surgeon: [name], MD;  Location: [facility];  Service: Orthopedics;  Laterality: Bilateral;   Social History   Socioeconomic History   Marital status: Single    Spouse name: Not on file   Number of children: 0   Years of education: Not on file   Highest education level: Not on file  Occupational History   Not on file  Tobacco Use   Smoking status: Never   Smokeless tobacco: Never  Vaping Use   Vaping Use: Never used  Substance and Sexual Activity   Alcohol use: No   Drug use: No   Sexual activity: Not Currently    Birth control/protection: None  Other  Topics Concern   Not on file  Social History Narrative   Not on file   Social Determinants of Health   Financial Resource Strain: Low Risk  (04/10/2019)   Overall Financial Resource Strain (CARDIA)    Difficulty of Paying Living Expenses: Not very hard  Food Insecurity: No Food Insecurity (04/10/2019)   Hunger Vital Sign    Worried About Running Out of Food in the Last Year: Never true    Ran Out of Food in the Last Year: Never true  Transportation Needs: No Transportation Needs (04/10/2019)   PRAPARE - Administrator, Civil Service (Medical): No    Lack of Transportation (Non-Medical): No  Physical Activity: Insufficiently Active (04/10/2019)   Exercise Vital Sign    Days of Exercise per Week: 2 days    Minutes of Exercise per Session: 30 min  Stress: Stress Concern Present (04/10/2019)   [employer] of Occupational Health - Occupational Stress Questionnaire    Feeling of Stress : Rather much  Social Connections: Moderately Integrated (04/10/2019)   Social Connection and Isolation Panel [NHANES]    Frequency of Communication with Friends and Family: More than three times a week    Frequency of Social Gatherings with Friends and Family: Once a week    Attends Religious Services: More than 4 times per year    Active Member of [employer] or Organizations: Yes    Attends Engineer, structural: More than 4 times per year    Marital Status: Never married  Intimate Partner Violence: Not At Risk (04/10/2019)   Humiliation, Afraid, Rape, and Kick questionnaire    Fear of Current or Ex-Partner: No    Emotionally Abused: No    Physically Abused: No    Sexually Abused: No   Family Status  Relation Name Status   Mother  Alive   Father  Alive   Sister  Alive   Oneal Grout  Alive   Family History  Problem Relation Age of Onset   Atrial fibrillation Mother    Bipolar disorder Paternal Uncle    Alcohol abuse Paternal Uncle    Depression Paternal Uncle    OCD Paternal  Uncle    No Known Allergies  Patient Care Team: [name], PA-C as PCP - General (Physician Assistant)   Medications: Outpatient Medications Prior to Visit  Medication Sig   ALPRAZolam (XANAX) 0.5 MG tablet Take 0.5 mg by mouth daily as needed for anxiety.   amphetamine-dextroamphetamine (ADDERALL) 20 MG tablet Take 20 mg by mouth 3 (three) times daily.   aspirin-acetaminophen-caffeine (EXCEDRIN MIGRAINE) 250-250-65 MG tablet Take 1 tablet by mouth every 6 (six) hours as needed for migraine.   cariprazine (VRAYLAR) 3 MG capsule Take 1.5 mg by mouth every evening.   cyanocobalamin (VITAMIN B12) 1000 MCG/ML injection Inject 1 mL (1,000 mcg total) into the muscle once for 1 dose. Inject 1 mL into the muscle once a week for 3 weeks and then once monthly (Patient taking differently: Inject 1,000 mcg into the skin every 30 (thirty) days.)   Erenumab-aooe (AIMOVIG) 140 MG/ML SOAJ Inject 140 mg into the skin every 30 (thirty) days.   lamoTRIgine (LAMICTAL) 200 MG tablet Take 400 mg by mouth daily.   levothyroxine (SYNTHROID) 75 MCG tablet Take 1 tablet (75 mcg total) by mouth daily before breakfast.   lumateperone tosylate (CAPLYTA) 42 MG capsule Take 42 mg by mouth daily.   OXcarbazepine (TRILEPTAL) 150 MG tablet Take 1 tablet (150 mg total) by mouth at bedtime.   prazosin (MINIPRESS) 2 MG capsule Take 2-4 mg by mouth daily as needed (For nightmares).   Rimegepant Sulfate (NURTEC) 75 MG TBDP Take 1 tablet (75 mg total) by mouth every other day.   SUMAtriptan 6 MG/0.5ML SOAJ Inject 6 mg into the skin as needed.   No facility-administered medications prior to visit.    Review of Systems  {Labs  Heme  Chem  Endocrine  Serology  Results Review (optional):23779}  Objective    LMP 03/15/2023 (Exact Date)  {Show previous vital signs (optional):23777}   Physical Exam  ***  Last depression screening scores    01/14/2022   10:40 AM 01/13/2021    8:18 AM 04/10/2019   10:07 AM  PHQ  2/9 Scores  PHQ - 2 Score 2 3   PHQ- 9 Score 6 5      Information is confidential and restricted. Go to Review Flowsheets to unlock data.   Last fall risk screening    01/14/2022   10:40 AM  Fall Risk   Falls in the past year? 0  Number falls in past yr: 0  Injury with Fall? 0  Risk for fall due to : No Fall Risks  Follow up Falls evaluation completed   Last Audit-C alcohol use screening    01/14/2022   10:40 AM  Alcohol Use Disorder Test (AUDIT)  1. How often do you have a drink containing alcohol? 0  2. How many drinks containing alcohol do you have on a typical day when  you are drinking? 0  3. How often do you have six or more drinks on one occasion? 0  AUDIT-C Score 0   A score of 3 or more in women, and 4 or more in men indicates increased risk for alcohol abuse, EXCEPT if all of the points are from question 1   No results found for any visits on 03/30/23.  Assessment & Plan    Routine Health Maintenance and Physical Exam  Exercise Activities and Dietary recommendations  Goals   None     Immunization History  Administered Date(s) Administered   Influenza,inj,Quad PF,6+ Mos 09/11/2020   Tdap 02/16/2019    Health Maintenance  Topic Date Due   COVID-19 Vaccine (1) Never done   Hepatitis C Screening  Never done   PAP SMEAR-Modifier  Never done   INFLUENZA VACCINE  06/30/2023   DTaP/Tdap/Td (2 - Td or Tdap) 02/15/2029   HIV Screening  Completed   HPV VACCINES  Aged Out    Discussed health benefits of physical activity, and encouraged her to engage in regular exercise appropriate for her age and condition.  ***  No follow-ups on file.     {provider attestation***:1}   [name], PA-C  [facility] [phone] (phone) [phone] (fax)  [facility]

## 2023-03-30 ENCOUNTER — Ambulatory Visit (INDEPENDENT_AMBULATORY_CARE_PROVIDER_SITE_OTHER): Payer: BC Managed Care – PPO | Admitting: Physician Assistant

## 2023-03-30 VITALS — BP 130/73 | HR 100 | Ht 67.0 in | Wt 239.4 lb

## 2023-03-30 DIAGNOSIS — G43E09 Chronic migraine with aura, not intractable, without status migrainosus: Secondary | ICD-10-CM

## 2023-03-30 DIAGNOSIS — Z720 Tobacco use: Secondary | ICD-10-CM | POA: Insufficient documentation

## 2023-03-30 DIAGNOSIS — R7303 Prediabetes: Secondary | ICD-10-CM | POA: Diagnosis not present

## 2023-03-30 DIAGNOSIS — E039 Hypothyroidism, unspecified: Secondary | ICD-10-CM | POA: Diagnosis not present

## 2023-03-30 DIAGNOSIS — Z1231 Encounter for screening mammogram for malignant neoplasm of breast: Secondary | ICD-10-CM

## 2023-03-30 DIAGNOSIS — Z0000 Encounter for general adult medical examination without abnormal findings: Secondary | ICD-10-CM

## 2023-03-30 DIAGNOSIS — F314 Bipolar disorder, current episode depressed, severe, without psychotic features: Secondary | ICD-10-CM

## 2023-03-30 NOTE — Assessment & Plan Note (Signed)
F/b neurology ?

## 2023-03-30 NOTE — Assessment & Plan Note (Signed)
Encouraged cessation.

## 2023-03-30 NOTE — Assessment & Plan Note (Addendum)
Pt has been unmedicated for around a year Will repeat tsh/t4 and treat as indicated Previous dose levothyroxine 75 mcg.

## 2023-03-30 NOTE — Assessment & Plan Note (Signed)
F/b psychiatry. H/o SA 2020. Well managed, follows with a therapist as well

## 2023-03-30 NOTE — Patient Instructions (Signed)
Please contact [phone] to schedule your mammogram. You will be asked your location preference to have procedure performed. You have two options listed below.  1) [facility] located at [address] [place], [place] [address] 2) [facility] [place] located at [address] [place], [place] [address]  Upon results being received our office will contact you. As well as all results can be viewed through your MyChart. Please feel free to contact us if you have any further questions or concerns.   

## 2023-03-30 NOTE — Assessment & Plan Note (Signed)
Historically repeat A1c

## 2023-03-31 ENCOUNTER — Other Ambulatory Visit (HOSPITAL_COMMUNITY): Payer: Self-pay

## 2023-03-31 ENCOUNTER — Telehealth: Payer: Self-pay

## 2023-03-31 DIAGNOSIS — R7303 Prediabetes: Secondary | ICD-10-CM | POA: Diagnosis not present

## 2023-03-31 DIAGNOSIS — Z0000 Encounter for general adult medical examination without abnormal findings: Secondary | ICD-10-CM | POA: Diagnosis not present

## 2023-03-31 DIAGNOSIS — E039 Hypothyroidism, unspecified: Secondary | ICD-10-CM | POA: Diagnosis not present

## 2023-03-31 NOTE — Telephone Encounter (Signed)
Pharmacy Patient Advocate Encounter   Received notification from [facility] that prior authorization for Aimovig 140MG /ML auto-injectors is required/requested.   PA submitted on 03/31/2023 to (ins) [employer] via [employer] or [facility]) confirmation # [id number] Status is pending

## 2023-04-01 ENCOUNTER — Other Ambulatory Visit: Payer: Self-pay | Admitting: Physician Assistant

## 2023-04-01 LAB — LIPID PANEL
Chol/HDL Ratio: 4 ratio (ref 0.0–4.4)
Cholesterol, Total: 202 mg/dL — ABNORMAL HIGH (ref 100–199)
HDL: 50 mg/dL (ref >39)
LDL Chol Calc (NIH): 130 mg/dL — ABNORMAL HIGH (ref 0–99)
Triglycerides: 124 mg/dL (ref 0–149)
VLDL Cholesterol Cal: 22 mg/dL (ref 5–40)

## 2023-04-01 LAB — HEMOGLOBIN A1C
Est. average glucose Bld gHb Est-mCnc: 120 mg/dL
Hgb A1c MFr Bld: 5.8 % — ABNORMAL HIGH (ref 4.8–5.6)

## 2023-04-01 LAB — TSH+FREE T4
Free T4: 1.22 ng/dL (ref 0.82–1.77)
TSH: 2.75 u[IU]/mL (ref 0.450–4.500)

## 2023-04-05 DIAGNOSIS — F4312 Post-traumatic stress disorder, chronic: Secondary | ICD-10-CM | POA: Diagnosis not present

## 2023-04-06 ENCOUNTER — Other Ambulatory Visit (HOSPITAL_COMMUNITY): Payer: Self-pay

## 2023-04-06 DIAGNOSIS — F4310 Post-traumatic stress disorder, unspecified: Secondary | ICD-10-CM | POA: Diagnosis not present

## 2023-04-06 DIAGNOSIS — F3175 Bipolar disorder, in partial remission, most recent episode depressed: Secondary | ICD-10-CM | POA: Diagnosis not present

## 2023-04-06 DIAGNOSIS — F900 Attention-deficit hyperactivity disorder, predominantly inattentive type: Secondary | ICD-10-CM | POA: Diagnosis not present

## 2023-04-06 DIAGNOSIS — F3174 Bipolar disorder, in full remission, most recent episode manic: Secondary | ICD-10-CM | POA: Diagnosis not present

## 2023-04-06 NOTE — Telephone Encounter (Signed)
Chronic migraines, 4 to 5 headaches days per week. Migraines can last the entire day up to 3 days.

## 2023-04-06 NOTE — Telephone Encounter (Signed)
   I did not find this answer in the chart notes (Will need to say number of headache days and migraine days each month for the past 3 months as well as how long each migraine lasts)-please advise.

## 2023-04-06 NOTE — Telephone Encounter (Signed)
Thank you so much for getting that information for me-Clinical questions have been submitted-will update as soon as we receive a response from insurance.

## 2023-04-07 ENCOUNTER — Telehealth: Payer: Self-pay

## 2023-04-07 ENCOUNTER — Other Ambulatory Visit (HOSPITAL_COMMUNITY): Payer: Self-pay

## 2023-04-07 NOTE — Telephone Encounter (Signed)
Pharmacy Patient Advocate Encounter   Received notification from [facility] that prior authorization for Nurtec 75MG  dispersible tablets is required/requested.   PA submitted on 04/07/2023 to (ins) [employer] [place] via [employer] or [facility]) confirmation # [id number] Status is pending

## 2023-04-07 NOTE — Telephone Encounter (Signed)
Pharmacy Patient Advocate Encounter   Received notification from [facility] that prior authorization for SUMAtriptan Succinate 6MG /0.5ML auto-injectors is required/requested.   PA submitted on 04/07/2023 to (ins) [employer] [place] via [employer] or [facility]) confirmation # [id number] Status is pending

## 2023-04-08 NOTE — Telephone Encounter (Signed)
I was answering the clinical questions and can you clarify-  The Nurtec is written like it is going to be taken as a preventative however at the end of the notes it states this will be taken as abortive but the sig dosen't reflect that-Please advise.

## 2023-04-11 ENCOUNTER — Other Ambulatory Visit: Payer: Self-pay | Admitting: Neurology

## 2023-04-11 ENCOUNTER — Other Ambulatory Visit (HOSPITAL_COMMUNITY): Payer: Self-pay

## 2023-04-11 MED ORDER — NURTEC 75 MG PO TBDP: 75.0000 mg | ORAL_TABLET | ORAL | 6 refills | Status: DC | PRN

## 2023-04-11 NOTE — Telephone Encounter (Signed)
Per [employer] a PA is not required-can get per 30 DS.

## 2023-04-11 NOTE — Telephone Encounter (Signed)
Yes, rescue medication.  I will update the Rx to 8 tablets.

## 2023-04-11 NOTE — Telephone Encounter (Signed)
Pharmacy Patient Advocate Encounter  Prior Authorization for Aimovig 140MG /ML auto-injectors has been approved by [facility] [employer] (ins).    PA # PA Case ID: [id number] Effective dates: 04/06/2023 through 06/29/2023

## 2023-04-12 DIAGNOSIS — F4312 Post-traumatic stress disorder, chronic: Secondary | ICD-10-CM | POA: Diagnosis not present

## 2023-04-13 NOTE — Telephone Encounter (Signed)
Pt is asking about a follow-up on PA for Nurtec. Stated she was giving samples and they are working. Pt requesting a call back

## 2023-04-13 NOTE — Telephone Encounter (Signed)
Called pt. Informed her of message nurse April sent.

## 2023-04-14 ENCOUNTER — Other Ambulatory Visit: Payer: Self-pay

## 2023-04-14 ENCOUNTER — Telehealth: Payer: Self-pay | Admitting: Neurology

## 2023-04-14 ENCOUNTER — Other Ambulatory Visit: Payer: Self-pay | Admitting: Neurology

## 2023-04-14 ENCOUNTER — Ambulatory Visit (HOSPITAL_COMMUNITY)
Admission: EM | Admit: 2023-04-14 | Discharge: 2023-04-15 | Disposition: A | Discharge status: Other Acute Inpatient Hospital | Payer: BC Managed Care – PPO | Attending: Psychiatry | Admitting: Psychiatry

## 2023-04-14 DIAGNOSIS — F4312 Post-traumatic stress disorder, chronic: Secondary | ICD-10-CM | POA: Diagnosis not present

## 2023-04-14 DIAGNOSIS — Z9151 Personal history of suicidal behavior: Secondary | ICD-10-CM | POA: Insufficient documentation

## 2023-04-14 DIAGNOSIS — Z79899 Other long term (current) drug therapy: Secondary | ICD-10-CM | POA: Diagnosis not present

## 2023-04-14 DIAGNOSIS — F69 Unspecified disorder of adult personality and behavior: Secondary | ICD-10-CM

## 2023-04-14 DIAGNOSIS — F319 Bipolar disorder, unspecified: Secondary | ICD-10-CM | POA: Insufficient documentation

## 2023-04-14 DIAGNOSIS — F411 Generalized anxiety disorder: Secondary | ICD-10-CM | POA: Insufficient documentation

## 2023-04-14 DIAGNOSIS — F909 Attention-deficit hyperactivity disorder, unspecified type: Secondary | ICD-10-CM | POA: Diagnosis not present

## 2023-04-14 DIAGNOSIS — G4700 Insomnia, unspecified: Secondary | ICD-10-CM | POA: Insufficient documentation

## 2023-04-14 DIAGNOSIS — R4689 Other symptoms and signs involving appearance and behavior: Secondary | ICD-10-CM | POA: Diagnosis not present

## 2023-04-14 LAB — LIPID PANEL
Cholesterol: 224 mg/dL — ABNORMAL HIGH (ref 0–200)
HDL: 55 mg/dL (ref >40)
LDL Cholesterol: 141 mg/dL — ABNORMAL HIGH (ref 0–99)
Total CHOL/HDL Ratio: 4.1 RATIO
Triglycerides: 142 mg/dL (ref <150)
VLDL: 28 mg/dL (ref 0–40)

## 2023-04-14 LAB — CBC WITH DIFFERENTIAL/PLATELET
Abs Immature Granulocytes: 0.02 10*3/uL (ref 0.00–0.07)
Basophils Absolute: 0.1 10*3/uL (ref 0.0–0.1)
Basophils Relative: 1 %
Eosinophils Absolute: 0.2 10*3/uL (ref 0.0–0.5)
Eosinophils Relative: 2 %
HCT: 41.7 % (ref 36.0–46.0)
Hemoglobin: 13.4 g/dL (ref 12.0–15.0)
Immature Granulocytes: 0 %
Lymphocytes Relative: 23 %
Lymphs Abs: 2.3 10*3/uL (ref 0.7–4.0)
MCH: 29.1 pg (ref 26.0–34.0)
MCHC: 32.1 g/dL (ref 30.0–36.0)
MCV: 90.7 fL (ref 80.0–100.0)
Monocytes Absolute: 0.7 10*3/uL (ref 0.1–1.0)
Monocytes Relative: 7 %
Neutro Abs: 6.7 10*3/uL (ref 1.7–7.7)
Neutrophils Relative %: 67 %
Platelets: 374 10*3/uL (ref 150–400)
RBC: 4.6 MIL/uL (ref 3.87–5.11)
RDW: 13.6 % (ref 11.5–15.5)
WBC: 9.9 10*3/uL (ref 4.0–10.5)
nRBC: 0 % (ref 0.0–0.2)

## 2023-04-14 LAB — COMPREHENSIVE METABOLIC PANEL
ALT: 21 U/L (ref 0–44)
AST: 17 U/L (ref 15–41)
Albumin: 3.7 g/dL (ref 3.5–5.0)
Alkaline Phosphatase: 75 U/L (ref 38–126)
Anion gap: 8 (ref 5–15)
BUN: 7 mg/dL (ref 6–20)
CO2: 26 mmol/L (ref 22–32)
Calcium: 9.4 mg/dL (ref 8.9–10.3)
Chloride: 105 mmol/L (ref 98–111)
Creatinine, Ser: 1.07 mg/dL — ABNORMAL HIGH (ref 0.44–1.00)
GFR, Estimated: >60 mL/min (ref >60)
Glucose, Bld: 83 mg/dL (ref 70–99)
Potassium: 3.5 mmol/L (ref 3.5–5.1)
Sodium: 139 mmol/L (ref 135–145)
Total Bilirubin: 0.3 mg/dL (ref 0.3–1.2)
Total Protein: 6.7 g/dL (ref 6.5–8.1)

## 2023-04-14 LAB — POCT URINE DRUG SCREEN - MANUAL ENTRY (I-SCREEN)
POC Amphetamine UR: NOT DETECTED
POC Buprenorphine (BUP): NOT DETECTED
POC Cocaine UR: NOT DETECTED
POC Marijuana UR: NOT DETECTED
POC Methadone UR: NOT DETECTED
POC Methamphetamine UR: NOT DETECTED
POC Morphine: NOT DETECTED
POC Oxazepam (BZO): POSITIVE — AB
POC Oxycodone UR: NOT DETECTED
POC Secobarbital (BAR): NOT DETECTED

## 2023-04-14 LAB — POCT PREGNANCY, URINE: Preg Test, Ur: NEGATIVE

## 2023-04-14 LAB — TSH: TSH: 3.1 u[IU]/mL (ref 0.350–4.500)

## 2023-04-14 LAB — ETHANOL: Alcohol, Ethyl (B): <10 mg/dL (ref <10)

## 2023-04-14 LAB — HEMOGLOBIN A1C
Hgb A1c MFr Bld: 5.8 % — ABNORMAL HIGH (ref 4.8–5.6)
Mean Plasma Glucose: 119.76 mg/dL

## 2023-04-14 LAB — POC URINE PREG, ED: Preg Test, Ur: NEGATIVE

## 2023-04-14 MED ORDER — UBRELVY 100 MG PO TABS: 100.0000 mg | ORAL_TABLET | ORAL | 3 refills | Status: DC | PRN

## 2023-04-14 MED ORDER — LUMATEPERONE TOSYLATE 42 MG PO CAPS
42.0000 mg | ORAL_CAPSULE | Freq: Every day | ORAL | Status: DC
  Filled 2023-04-14: qty 1

## 2023-04-14 MED ORDER — ALUM & MAG HYDROXIDE-SIMETH 200-200-20 MG/5ML PO SUSP: 30.0000 mL | ORAL | Status: DC | PRN

## 2023-04-14 MED ORDER — ZIPRASIDONE MESYLATE 20 MG IM SOLR: 20.0000 mg | INTRAMUSCULAR | Status: DC | PRN

## 2023-04-14 MED ORDER — CARIPRAZINE HCL 1.5 MG PO CAPS
1.5000 mg | ORAL_CAPSULE | Freq: Every evening | ORAL | Status: DC
  Administered 2023-04-14: 1.5 mg via ORAL
  Filled 2023-04-14: qty 1

## 2023-04-14 MED ORDER — LAMOTRIGINE 150 MG PO TABS
400.0000 mg | ORAL_TABLET | Freq: Every day | ORAL | Status: DC
  Administered 2023-04-15: 400 mg via ORAL
  Filled 2023-04-14: qty 1

## 2023-04-14 MED ORDER — AMPHETAMINE-DEXTROAMPHETAMINE 10 MG PO TABS
20.0000 mg | ORAL_TABLET | Freq: Three times a day (TID) | ORAL | Status: DC
  Administered 2023-04-15 (×2): 20 mg via ORAL
  Filled 2023-04-14 (×2): qty 2

## 2023-04-14 MED ORDER — LORAZEPAM 1 MG PO TABS: 1.0000 mg | ORAL_TABLET | ORAL | Status: DC | PRN

## 2023-04-14 MED ORDER — OLANZAPINE 10 MG PO TBDP: 10.0000 mg | ORAL_TABLET | Freq: Three times a day (TID) | ORAL | Status: DC | PRN

## 2023-04-14 MED ORDER — MAGNESIUM HYDROXIDE 400 MG/5ML PO SUSP: 30.0000 mL | Freq: Every day | ORAL | Status: DC | PRN

## 2023-04-14 MED ORDER — ACETAMINOPHEN 325 MG PO TABS
650.0000 mg | ORAL_TABLET | Freq: Four times a day (QID) | ORAL | Status: DC | PRN
  Administered 2023-04-15: 650 mg via ORAL
  Filled 2023-04-14: qty 2

## 2023-04-14 MED ORDER — PRAZOSIN HCL 2 MG PO CAPS: 2.0000 mg | ORAL_CAPSULE | Freq: Every day | ORAL | Status: DC | PRN

## 2023-04-14 MED ORDER — ALPRAZOLAM 0.5 MG PO TABS
0.5000 mg | ORAL_TABLET | Freq: Every day | ORAL | Status: DC | PRN
  Administered 2023-04-14: 0.5 mg via ORAL
  Filled 2023-04-14: qty 1

## 2023-04-14 NOTE — Telephone Encounter (Signed)
I resubmitted the PA with the updated dosing,  Status is pending  [id number] Nurtec 75mg , 8 tabs

## 2023-04-14 NOTE — Progress Notes (Signed)
Insurance wants patient to try Shelby Gallegos first before approving Nurtec.

## 2023-04-14 NOTE — ED Notes (Signed)
Pt sleeping@this time. Breathing even and unlabored. Will continue to monitor for safety 

## 2023-04-14 NOTE — Telephone Encounter (Signed)
[email or web address]  called to report the PA was denied for pt's Rimegepant Sulfate (NURTEC) 75 MG TBDP .  They are faxing over a denial letter.  Their call back# is [phone] option 3 option 1
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
## 2023-04-14 NOTE — Progress Notes (Signed)
   04/14/23 1957  [facility] Triage Screening (Walk-ins at [facility] only)  How Did You Hear About [place]? Family/Friend  What Is the Reason for Your Visit/Call Today? Shelby Gallegos is a 42 y/o female presenting to the [facility] as a walk-in. Diagnosed with Bipolar Disorder and chronic suicidal ideations. Referred by her therapist [name]) from [facility] in [place], [place]. States that she had a therapy appointment at 4pm, today. During that appointment she told her therapist that she tried to commit suicide last night because she hasn't been able to sleep. Patient states that she took 5-6 Trazadone pills (old prescription). She doesn't remember the dosage per pill.  In addition to the Trazadone overdose, she consumed "a couple of Smirnoff beers". She also made another suicide attempt 2020 by cutting her wrist, consuming #70 Xanax, and 2 bottles of  Nyquil. No hx of self injurious behaviors. Stressors: "I just got a new job, evicted my roommate, and loss my AA sponsor because I wasn't motivated". No HI and AVH's. No hx of drug use. However, has a hx of alcohol abuse. She has been sober for 3 years, relapsed last night when she overdosed on the Trazodone. She has participates in inpatient treatment, 3x's (1998, 2018, 2020). Single, no children, and lives alone in [place], [place].  How Long Has This Been Causing You Problems? 1 wk - 1 month  Have You Recently Had Any Thoughts About Hurting Yourself? Yes  How long ago did you have thoughts about hurting yourself? Patient overdosed on Trazadone and consumed alcohol, last night.  Are You Planning to Commit Suicide/Harm Yourself At This time? No  Have you Recently Had Thoughts About Hurting Someone [name]? No  Are You Planning To Harm Someone At This Time? No  Are you currently experiencing any auditory, visual or other hallucinations? No  Have You Used Any Alcohol or Drugs in the Past 24 Hours? No  Do you have any current medical co-morbidities that require immediate  attention? No  Clinician description of patient physical appearance/behavior: Patient is calm and cooperative.  What Do You Feel Would Help You the Most Today? Treatment for Depression or other mood problem;Stress Management;Medication(s);Alcohol or Drug Use Treatment  If access to [facility] was not available, would you have sought care in the Emergency Department? No  Determination of Need Urgent (48 hours)  Options For Referral Medication Management;Inpatient Hospitalization

## 2023-04-14 NOTE — Telephone Encounter (Signed)
You can prescribed her Ubrelby 100 mg as abortive medication.

## 2023-04-14 NOTE — Telephone Encounter (Signed)
Pharmacy Patient Advocate Encounter  Received notification from [facility] that the request for prior authorization for Nurtec 75MG  dispersible tablets has been denied due to PT has not tried [place] first..    Please be advised we currently do not have a Pharmacist to review denials, therefore you will need to process appeals accordingly as needed. Thanks for your support at this time.  I have not received a denial letter yet via fax or CMM there is an E Appeal available on CMM.

## 2023-04-14 NOTE — ED Notes (Signed)
Pt A&O x 4, presents with suicidal ideations, pt ingested  5-6 Trazodone and consumed a couple of beers.  Previous SI attempts noted in past.  Denies HI or AVH.  Monitoring for safety.

## 2023-04-14 NOTE — ED Provider Notes (Signed)
[facility] Continuous Assessment Admission H&P  Date: 04/15/23 Patient Name: Shelby Gallegos MRN: [id number] Chief Complaint: I took the trazodone to numb out because I couldn't sleep  Diagnoses:  Final diagnoses:  Insomnia, unspecified type  Behavior concern in adult  Generalized anxiety disorder    HPI: Shelby Gallegos, 42 y/o with a history of bipolar disorder, anxiety, ADHD, prior suicide attempt and OD.  Presented to [facility] voluntarily after she was referred by her therapist.  According to patient she went to see her therapist today and she was telling her therapist that she could not sleep and took 5 trazodone which she did not remember the milligrams.  According to patient stressors include her new job and her being broken up with her AA sponsor.  According to the patient she has been hospitalized 3 times for psychiatric issues ranging from 19 98,  2020 where she OD'd on 17 Xanax, and 2 NyQuil.  According to patient she just started a new job and she has been trying to catch up on her orientation however she is not getting enough patient report that she also had a disagreement with her roommate and she had to evict her and this also added to her stress level. According to patient she now lives alone.  Patient sees a psychiatry at [facility] and she sees a therapy weekly.    Face-to-face observation of patient, patient is alert and oriented x 4, speech is clear, maintained minimal eye contact.  Patient appearance is well groomed, patient is anxious and depressed, affect is flat congruent with mood.  Patient answers all questions appropriately and very cooperative with the interview process.  Patient denies that she is suicidal, denies HI, denies AVH or paranoia.  However given to the nature of why patient came in and what transpired last night patient was advised that staying overnight would a better choice for her this will allow her to get her mind off thing and refocus.  According to  patient she has been sober for over 3 years but start drinking last night she drank 2 bottles of Smirnoff and also took her medicine at the same time.  Patient denies access to guns at this time.  According to patient she does not smoke but she is vape, patient denies any illicit drug use.  At this time patient does not seem to be influenced by external or internal stimuli. Writer discussed with patient the need for overnight observation.  Patient in agreement.  Will restart pt home  medications   Recommend overnight observation  Total Time spent with patient: 30 minutes  Musculoskeletal  Strength & Muscle Tone: within normal limits Gait & Station: normal Patient leans: N/A  Psychiatric Specialty Exam  Presentation General Appearance:  Casual  Eye Contact: Fair  Speech: Clear and Coherent  Speech Volume: Normal  Handedness: Right   Mood and Affect  Mood: Anxious; Depressed  Affect: Appropriate   Thought Process  Thought Processes: Coherent  Descriptions of Associations:Circumstantial  Orientation:Full (Time, Place and Person)  Thought Content:Logical    Hallucinations:Hallucinations: None  Ideas of Reference:None  Suicidal Thoughts:Suicidal Thoughts: Yes, Passive SI Passive Intent and/or Plan: With Plan  Homicidal Thoughts:Homicidal Thoughts: No   Sensorium  Memory: Immediate Fair  Judgment: Poor  Insight: Fair   Chartered certified accountant: Fair  Attention Span: Good  Recall: Good  Fund of Knowledge: Good  Language: Good   Psychomotor Activity  Psychomotor Activity: Psychomotor Activity: Normal   Assets  Assets: Desire  for Improvement; Resilience   Sleep  Sleep: Sleep: Poor Number of Hours of Sleep: 3   Nutritional Assessment (For OBS and FBC admissions only) Has the patient had a weight loss or gain of 10 pounds or more in the last 3 months?: No Has the patient had a decrease in food intake/or  appetite?: No Does the patient have dental problems?: No Does the patient have eating habits or behaviors that may be indicators of an eating disorder including binging or inducing vomiting?: No Has the patient recently lost weight without trying?: 0 Has the patient been eating poorly because of a decreased appetite?: 0 Malnutrition Screening Tool Score: 0    Physical Exam HENT:     Head: Normocephalic.     Nose: Nose normal.  Cardiovascular:     Rate and Rhythm: Normal rate.  Pulmonary:     Effort: Pulmonary effort is normal.  Musculoskeletal:        General: Normal range of motion.     Cervical back: Normal range of motion.  Neurological:     General: No focal deficit present.     Mental Status: She is alert.  Psychiatric:        Mood and Affect: Mood normal.        Behavior: Behavior normal.        Thought Content: Thought content normal.        Judgment: Judgment normal.    Review of Systems  Constitutional: Negative.   HENT: Negative.    Eyes: Negative.   Respiratory: Negative.    Cardiovascular: Negative.   Gastrointestinal: Negative.   Genitourinary: Negative.   Musculoskeletal: Negative.   Skin: Negative.   Neurological: Negative.   Psychiatric/Behavioral:  Positive for depression and suicidal ideas. The patient is nervous/anxious.     Blood pressure (!) 136/90, pulse 99, temperature 98.1 F (36.7 C), temperature source Oral, resp. rate 19, last menstrual period 03/15/2023, SpO2 99 %. There is no height or weight on file to calculate BMI.  Past Psychiatric History: Bipolar disorder, ADHD, anxiety  Is the patient at risk to self? Yes  Has the patient been a risk to self in the past 6 months? Yes .    Has the patient been a risk to self within the distant past? Yes   Is the patient a risk to others? No   Has the patient been a risk to others in the past 6 months? No   Has the patient been a risk to others within the distant past? No   Past Medical  History: See chart  Family History: Unknown   Social History: Alcohol, Vape  Last Labs:  Admission on 04/14/2023  Component Date Value Ref Range Status   WBC 04/14/2023 9.9  4.0 - 10.5 K/uL Final   RBC 04/14/2023 4.60  3.87 - 5.11 MIL/uL Final   Hemoglobin 04/14/2023 13.4  12.0 - 15.0 g/dL Final   HCT 16/08/9603 41.7  36.0 - 46.0 % Final   MCV 04/14/2023 90.7  80.0 - 100.0 fL Final   MCH 04/14/2023 29.1  26.0 - 34.0 pg Final   MCHC 04/14/2023 32.1  30.0 - 36.0 g/dL Final   RDW 54/07/8118 13.6  11.5 - 15.5 % Final   Platelets 04/14/2023 374  150 - 400 K/uL Final   nRBC 04/14/2023 0.0  0.0 - 0.2 % Final   Neutrophils Relative % 04/14/2023 67  % Final   Neutro Abs 04/14/2023 6.7  1.7 - 7.7 K/uL Final  Lymphocytes Relative 04/14/2023 23  % Final   Lymphs Abs 04/14/2023 2.3  0.7 - 4.0 K/uL Final   Monocytes Relative 04/14/2023 7  % Final   Monocytes Absolute 04/14/2023 0.7  0.1 - 1.0 K/uL Final   Eosinophils Relative 04/14/2023 2  % Final   Eosinophils Absolute 04/14/2023 0.2  0.0 - 0.5 K/uL Final   Basophils Relative 04/14/2023 1  % Final   Basophils Absolute 04/14/2023 0.1  0.0 - 0.1 K/uL Final   Immature Granulocytes 04/14/2023 0  % Final   Abs Immature Granulocytes 04/14/2023 0.02  0.00 - 0.07 K/uL Final   Performed at [facility], [address], [place], [place] [address]   Sodium 04/14/2023 139  135 - 145 mmol/L Final   Potassium 04/14/2023 3.5  3.5 - 5.1 mmol/L Final   Chloride 04/14/2023 105  98 - 111 mmol/L Final   CO2 04/14/2023 26  22 - 32 mmol/L Final   Glucose, Bld 04/14/2023 83  70 - 99 mg/dL Final   Glucose reference range applies only to samples taken after fasting for at least 8 hours.   BUN 04/14/2023 7  6 - 20 mg/dL Final   Creatinine, Ser 04/14/2023 1.07 (H)  0.44 - 1.00 mg/dL Final   Calcium 60/45/4098 9.4  8.9 - 10.3 mg/dL Final   Total Protein 11/91/4782 6.7  6.5 - 8.1 g/dL Final   Albumin 95/62/1308 3.7  3.5 - 5.0 g/dL Final   AST 65/78/4696 17   15 - 41 U/L Final   ALT 04/14/2023 21  0 - 44 U/L Final   Alkaline Phosphatase 04/14/2023 75  38 - 126 U/L Final   Total Bilirubin 04/14/2023 0.3  0.3 - 1.2 mg/dL Final   GFR, Estimated 04/14/2023 >60  >60 mL/min Final   Comment: (NOTE) Calculated using the CKD-EPI Creatinine Equation (2021)    Anion gap 04/14/2023 8  5 - 15 Final   Performed at [facility], [address], [place], [place] [address]   Hgb A1c MFr Bld 04/14/2023 5.8 (H)  4.8 - 5.6 % Final   Comment: (NOTE) Pre diabetes:          5.7%-6.4%  Diabetes:              >6.4%  Glycemic control for   <7.0% adults with diabetes    Mean Plasma Glucose 04/14/2023 119.76  mg/dL Final   Performed at [facility], [address], [place], [place] [address]   Alcohol, Ethyl (B) 04/14/2023 <10  <10 mg/dL Final   Comment: (NOTE) Lowest detectable limit for serum alcohol is 10 mg/dL.  For medical purposes only. Performed at [facility], [address], [place], [place] [address]    Cholesterol 04/14/2023 224 (H)  0 - 200 mg/dL Final   Triglycerides 72/53/6644 142  <150 mg/dL Final   HDL 03/47/4259 55  >40 mg/dL Final   Total CHOL/HDL Ratio 04/14/2023 4.1  RATIO Final   VLDL 04/14/2023 28  0 - 40 mg/dL Final   LDL Cholesterol 04/14/2023 141 (H)  0 - 99 mg/dL Final   Comment:        Total Cholesterol/HDL:CHD Risk Coronary Heart Disease Risk Table                     Men   Women  1/2 Average Risk   3.4   3.3  Average Risk       5.0   4.4  2 X Average Risk   9.6   7.1  3 X Average Risk  23.4   11.0        Use the calculated Patient Ratio above and the CHD Risk Table to determine the patient's CHD Risk.        ATP III CLASSIFICATION (LDL):  <100     mg/dL   Optimal  657-846  mg/dL   Near or Above                    Optimal  130-159  mg/dL   Borderline  962-952  mg/dL   High  >841     mg/dL   Very High Performed at [facility], [address], [place], [place] [address]    Preg Test, Ur  04/14/2023 Negative  Negative Final   POC Amphetamine UR 04/14/2023 None Detected  NONE DETECTED (Cut Off Level 1000 ng/mL) Final   POC Secobarbital (BAR) 04/14/2023 None Detected  NONE DETECTED (Cut Off Level 300 ng/mL) Final   POC Buprenorphine (BUP) 04/14/2023 None Detected  NONE DETECTED (Cut Off Level 10 ng/mL) Final   POC Oxazepam (BZO) 04/14/2023 Positive (A)  NONE DETECTED (Cut Off Level 300 ng/mL) Final   POC Cocaine UR 04/14/2023 None Detected  NONE DETECTED (Cut Off Level 300 ng/mL) Final   POC Methamphetamine UR 04/14/2023 None Detected  NONE DETECTED (Cut Off Level 1000 ng/mL) Final   POC Morphine 04/14/2023 None Detected  NONE DETECTED (Cut Off Level 300 ng/mL) Final   POC Methadone UR 04/14/2023 None Detected  NONE DETECTED (Cut Off Level 300 ng/mL) Final   POC Oxycodone UR 04/14/2023 None Detected  NONE DETECTED (Cut Off Level 100 ng/mL) Final   POC Marijuana UR 04/14/2023 None Detected  NONE DETECTED (Cut Off Level 50 ng/mL) Final   Preg Test, Ur 04/14/2023 NEGATIVE  NEGATIVE Final   Comment:        THE SENSITIVITY OF THIS METHODOLOGY IS >24 mIU/mL    TSH 04/14/2023 3.100  0.350 - 4.500 uIU/mL Final   Comment: Performed by a 3rd Generation assay with a functional sensitivity of <=0.01 uIU/mL. Performed at [facility], [address], [place], [place] [address]   Office Visit on 03/30/2023  Component Date Value Ref Range Status   Cholesterol, Total 03/31/2023 202 (H)  100 - 199 mg/dL Final   Triglycerides 53/66/4403 124  0 - 149 mg/dL Final   HDL 47/42/5956 50  >39 mg/dL Final   VLDL Cholesterol Cal 03/31/2023 22  5 - 40 mg/dL Final   LDL Chol Calc (NIH) 03/31/2023 130 (H)  0 - 99 mg/dL Final   Chol/HDL Ratio 03/31/2023 4.0  0.0 - 4.4 ratio Final   Comment:                                   T. Chol/HDL Ratio                                             Men  Women                               1/2 Avg.Risk  3.4    3.3  Avg.Risk  5.0     4.4                                2X Avg.Risk  9.6    7.1                                3X Avg.Risk 23.4   11.0    Hgb A1c MFr Bld 03/31/2023 5.8 (H)  4.8 - 5.6 % Final   Comment:          Prediabetes: 5.7 - 6.4          Diabetes: >6.4          Glycemic control for adults with diabetes: <7.0    Est. average glucose Bld gHb Est-m* 03/31/2023 120  mg/dL Final   TSH 16/08/9603 2.750  0.450 - 4.500 uIU/mL Final   Free T4 03/31/2023 1.22  0.82 - 1.77 ng/dL Final  Admission on 54/07/8118, Discharged on 03/21/2023  Component Date Value Ref Range Status   Sodium 03/21/2023 140  135 - 145 mmol/L Final   Potassium 03/21/2023 3.5  3.5 - 5.1 mmol/L Final   Chloride 03/21/2023 105  98 - 111 mmol/L Final   CO2 03/21/2023 23  22 - 32 mmol/L Final   Glucose, Bld 03/21/2023 86  70 - 99 mg/dL Final   Glucose reference range applies only to samples taken after fasting for at least 8 hours.   BUN 03/21/2023 7  6 - 20 mg/dL Final   Creatinine, Ser 03/21/2023 1.12 (H)  0.44 - 1.00 mg/dL Final   Calcium 14/78/2956 8.8 (L)  8.9 - 10.3 mg/dL Final   Total Protein 21/30/8657 7.1  6.5 - 8.1 g/dL Final   Albumin 84/69/6295 3.7  3.5 - 5.0 g/dL Final   AST 28/41/3244 19  15 - 41 U/L Final   ALT 03/21/2023 18  0 - 44 U/L Final   Alkaline Phosphatase 03/21/2023 73  38 - 126 U/L Final   Total Bilirubin 03/21/2023 0.3  0.3 - 1.2 mg/dL Final   GFR, Estimated 03/21/2023 >60  >60 mL/min Final   Comment: (NOTE) Calculated using the CKD-EPI Creatinine Equation (2021)    Anion gap 03/21/2023 12  5 - 15 Final   Performed at [facility], [address] [place], [place] [address]   WBC 03/21/2023 10.4  4.0 - 10.5 K/uL Final   RBC 03/21/2023 4.41  3.87 - 5.11 MIL/uL Final   Hemoglobin 03/21/2023 12.9  12.0 - 15.0 g/dL Final   HCT 25/36/6440 40.2  36.0 - 46.0 % Final   MCV 03/21/2023 91.2  80.0 - 100.0 fL Final   MCH 03/21/2023 29.3  26.0 - 34.0 pg Final   MCHC 03/21/2023 32.1  30.0 - 36.0 g/dL Final   RDW  34/74/2595 13.7  11.5 - 15.5 % Final   Platelets 03/21/2023 389  150 - 400 K/uL Final   nRBC 03/21/2023 0.0  0.0 - 0.2 % Final   Performed at [facility], [address], [place], [place] [address]   I-stat hCG, quantitative 03/21/2023 <5.0  <5 mIU/mL Final   Comment 3 03/21/2023          Final   Comment:   GEST. AGE      CONC.  (mIU/mL)   <=1 WEEK        5 -  50     2 WEEKS       50 - 500     3 WEEKS       100 - 10,000     4 WEEKS     1,000 - 30,000        FEMALE AND NON-PREGNANT FEMALE:     LESS THAN 5 mIU/mL    Color, Urine 03/21/2023 YELLOW  YELLOW Final   APPearance 03/21/2023 HAZY (A)  CLEAR Final   Specific Gravity, Urine 03/21/2023 1.008  1.005 - 1.030 Final   pH 03/21/2023 5.0  5.0 - 8.0 Final   Glucose, UA 03/21/2023 NEGATIVE  NEGATIVE mg/dL Final   Hgb urine dipstick 03/21/2023 SMALL (A)  NEGATIVE Final   Bilirubin Urine 03/21/2023 NEGATIVE  NEGATIVE Final   Ketones, ur 03/21/2023 NEGATIVE  NEGATIVE mg/dL Final   Protein, ur 16/08/9603 NEGATIVE  NEGATIVE mg/dL Final   Nitrite 54/07/8118 NEGATIVE  NEGATIVE Final   Leukocytes,Ua 03/21/2023 MODERATE (A)  NEGATIVE Final   RBC / HPF 03/21/2023 0-5  0 - 5 RBC/hpf Final   WBC, UA 03/21/2023 21-50  0 - 5 WBC/hpf Final   Bacteria, UA 03/21/2023 RARE (A)  NONE SEEN Final   Squamous Epithelial / HPF 03/21/2023 6-10  0 - 5 /HPF Final   Performed at [facility], [address] [place], [place] [address]    Allergies: Patient has no known allergies.  Medications:  Facility Ordered Medications  Medication   acetaminophen (TYLENOL) tablet 650 mg   alum & mag hydroxide-simeth (MAALOX/MYLANTA) 200-200-20 MG/5ML suspension 30 mL   magnesium hydroxide (MILK OF MAGNESIA) suspension 30 mL   OLANZapine zydis (ZYPREXA) disintegrating tablet 10 mg   And   LORazepam (ATIVAN) tablet 1 mg   And   ziprasidone (GEODON) injection 20 mg   prazosin (MINIPRESS) capsule 2-4 mg   lumateperone tosylate (CAPLYTA) capsule 42 mg    lamoTRIgine (LAMICTAL) tablet 400 mg   cariprazine (VRAYLAR) capsule 1.5 mg   amphetamine-dextroamphetamine (ADDERALL) tablet 20 mg   ALPRAZolam (XANAX) tablet 0.5 mg   PTA Medications  Medication Sig   prazosin (MINIPRESS) 2 MG capsule Take 2-4 mg by mouth daily as needed (For nightmares).   amphetamine-dextroamphetamine (ADDERALL) 20 MG tablet Take 20 mg by mouth 3 (three) times daily.   ALPRAZolam (XANAX) 0.5 MG tablet Take 0.5 mg by mouth daily as needed for anxiety.   aspirin-acetaminophen-caffeine (EXCEDRIN MIGRAINE) 250-250-65 MG tablet Take 1 tablet by mouth every 6 (six) hours as needed for migraine.   cyanocobalamin (VITAMIN B12) 1000 MCG/ML injection Inject 1 mL (1,000 mcg total) into the muscle once for 1 dose. Inject 1 mL into the muscle once a week for 3 weeks and then once monthly (Patient taking differently: Inject 1,000 mcg into the skin every 30 (thirty) days.)   cariprazine (VRAYLAR) 3 MG capsule Take 1.5 mg by mouth every evening.   lamoTRIgine (LAMICTAL) 200 MG tablet Take 400 mg by mouth daily.   lumateperone tosylate (CAPLYTA) 42 MG capsule Take 42 mg by mouth daily.   Erenumab-aooe (AIMOVIG) 140 MG/ML SOAJ Inject 140 mg into the skin every 30 (thirty) days.   SUMAtriptan 6 MG/0.5ML SOAJ Inject 6 mg into the skin as needed.      Medical Decision Making  Inpatient observation   Lab Orders         CBC with Differential/Platelet         Comprehensive metabolic panel  Hemoglobin A1c         Ethanol         Lipid panel         TSH         POC urine preg, ED         POCT Urine Drug Screen - (I-Screen)         Pregnancy, urine POC     Meds ordered this encounter  Medications   acetaminophen (TYLENOL) tablet 650 mg   alum & mag hydroxide-simeth (MAALOX/MYLANTA) 200-200-20 MG/5ML suspension 30 mL   magnesium hydroxide (MILK OF MAGNESIA) suspension 30 mL   AND Linked Order Group    OLANZapine zydis (ZYPREXA) disintegrating tablet 10 mg    LORazepam  (ATIVAN) tablet 1 mg    ziprasidone (GEODON) injection 20 mg   prazosin (MINIPRESS) capsule 2-4 mg   lumateperone tosylate (CAPLYTA) capsule 42 mg   lamoTRIgine (LAMICTAL) tablet 400 mg   cariprazine (VRAYLAR) capsule 1.5 mg   amphetamine-dextroamphetamine (ADDERALL) tablet 20 mg   ALPRAZolam (XANAX) tablet 0.5 mg     Recommendations  Based on my evaluation the patient appears to have an emergency medical condition for which I recommend the patient be transferred to the emergency department for further evaluation.  Sindy Guadeloupe, NP 04/15/23  6:07 AM

## 2023-04-14 NOTE — Telephone Encounter (Signed)
I sent the Rx for 8 tablets

## 2023-04-15 ENCOUNTER — Telehealth: Payer: Self-pay

## 2023-04-15 ENCOUNTER — Encounter (HOSPITAL_COMMUNITY): Payer: Self-pay | Admitting: Psychiatry

## 2023-04-15 ENCOUNTER — Inpatient Hospital Stay (HOSPITAL_COMMUNITY)
Admission: AD | Admit: 2023-04-15 | Discharge: 2023-04-21 | DRG: 885 | Disposition: A | Discharge status: Home / Self Care | Payer: BC Managed Care – PPO | Source: Intra-hospital | Attending: Psychiatry | Admitting: Psychiatry

## 2023-04-15 ENCOUNTER — Other Ambulatory Visit (HOSPITAL_COMMUNITY): Payer: Self-pay

## 2023-04-15 DIAGNOSIS — T1491XA Suicide attempt, initial encounter: Secondary | ICD-10-CM | POA: Diagnosis present

## 2023-04-15 DIAGNOSIS — F314 Bipolar disorder, current episode depressed, severe, without psychotic features: Secondary | ICD-10-CM | POA: Diagnosis not present

## 2023-04-15 DIAGNOSIS — F411 Generalized anxiety disorder: Secondary | ICD-10-CM

## 2023-04-15 DIAGNOSIS — G43909 Migraine, unspecified, not intractable, without status migrainosus: Secondary | ICD-10-CM | POA: Diagnosis not present

## 2023-04-15 DIAGNOSIS — G4700 Insomnia, unspecified: Secondary | ICD-10-CM | POA: Diagnosis not present

## 2023-04-15 DIAGNOSIS — F4310 Post-traumatic stress disorder, unspecified: Secondary | ICD-10-CM | POA: Diagnosis present

## 2023-04-15 DIAGNOSIS — Z79899 Other long term (current) drug therapy: Secondary | ICD-10-CM | POA: Diagnosis not present

## 2023-04-15 DIAGNOSIS — F69 Unspecified disorder of adult personality and behavior: Secondary | ICD-10-CM

## 2023-04-15 DIAGNOSIS — F909 Attention-deficit hyperactivity disorder, unspecified type: Secondary | ICD-10-CM | POA: Diagnosis present

## 2023-04-15 DIAGNOSIS — F319 Bipolar disorder, unspecified: Secondary | ICD-10-CM | POA: Diagnosis present

## 2023-04-15 DIAGNOSIS — F419 Anxiety disorder, unspecified: Secondary | ICD-10-CM | POA: Diagnosis not present

## 2023-04-15 DIAGNOSIS — T43212A Poisoning by selective serotonin and norepinephrine reuptake inhibitors, intentional self-harm, initial encounter: Secondary | ICD-10-CM | POA: Diagnosis not present

## 2023-04-15 DIAGNOSIS — Z9151 Personal history of suicidal behavior: Secondary | ICD-10-CM | POA: Diagnosis not present

## 2023-04-15 DIAGNOSIS — Z818 Family history of other mental and behavioral disorders: Secondary | ICD-10-CM | POA: Diagnosis not present

## 2023-04-15 DIAGNOSIS — Z720 Tobacco use: Secondary | ICD-10-CM | POA: Diagnosis present

## 2023-04-15 MED ORDER — DIPHENHYDRAMINE HCL 50 MG/ML IJ SOLN: 50.0000 mg | Freq: Three times a day (TID) | INTRAMUSCULAR | Status: DC | PRN

## 2023-04-15 MED ORDER — HALOPERIDOL 5 MG PO TABS
5.0000 mg | ORAL_TABLET | Freq: Three times a day (TID) | ORAL | Status: DC | PRN
  Administered 2023-04-16 – 2023-04-20 (×3): 5 mg via ORAL
  Filled 2023-04-15 (×3): qty 1

## 2023-04-15 MED ORDER — ALUM & MAG HYDROXIDE-SIMETH 200-200-20 MG/5ML PO SUSP: 30.0000 mL | ORAL | Status: DC | PRN

## 2023-04-15 MED ORDER — HALOPERIDOL LACTATE 5 MG/ML IJ SOLN: 5.0000 mg | Freq: Three times a day (TID) | INTRAMUSCULAR | Status: DC | PRN

## 2023-04-15 MED ORDER — LORAZEPAM 1 MG PO TABS
2.0000 mg | ORAL_TABLET | Freq: Three times a day (TID) | ORAL | Status: DC | PRN
  Administered 2023-04-16 – 2023-04-20 (×3): 2 mg via ORAL
  Filled 2023-04-15 (×3): qty 2

## 2023-04-15 MED ORDER — ACETAMINOPHEN 325 MG PO TABS
650.0000 mg | ORAL_TABLET | Freq: Four times a day (QID) | ORAL | Status: DC | PRN
  Administered 2023-04-15 – 2023-04-18 (×3): 650 mg via ORAL
  Filled 2023-04-15 (×3): qty 2

## 2023-04-15 MED ORDER — CARIPRAZINE HCL 1.5 MG PO CAPS
1.5000 mg | ORAL_CAPSULE | Freq: Every evening | ORAL | Status: DC
  Administered 2023-04-15 – 2023-04-18 (×4): 1.5 mg via ORAL
  Filled 2023-04-15 (×6): qty 1

## 2023-04-15 MED ORDER — LUMATEPERONE TOSYLATE 42 MG PO CAPS
42.0000 mg | ORAL_CAPSULE | Freq: Every day | ORAL | Status: DC
  Filled 2023-04-15 (×4): qty 1

## 2023-04-15 MED ORDER — PRAZOSIN HCL 1 MG PO CAPS
2.0000 mg | ORAL_CAPSULE | Freq: Every day | ORAL | Status: DC | PRN
  Administered 2023-04-15: 2 mg via ORAL
  Filled 2023-04-15: qty 2

## 2023-04-15 MED ORDER — AMPHETAMINE-DEXTROAMPHETAMINE 20 MG PO TABS: 20.0000 mg | ORAL_TABLET | Freq: Three times a day (TID) | ORAL | Status: DC

## 2023-04-15 MED ORDER — MAGNESIUM HYDROXIDE 400 MG/5ML PO SUSP: 30.0000 mL | Freq: Every day | ORAL | Status: DC | PRN

## 2023-04-15 MED ORDER — ALPRAZOLAM 0.5 MG PO TABS
0.5000 mg | ORAL_TABLET | Freq: Every day | ORAL | Status: DC | PRN
  Administered 2023-04-15 – 2023-04-20 (×6): 0.5 mg via ORAL
  Filled 2023-04-15 (×6): qty 1

## 2023-04-15 MED ORDER — LORAZEPAM 2 MG/ML IJ SOLN: 2.0000 mg | Freq: Three times a day (TID) | INTRAMUSCULAR | Status: DC | PRN

## 2023-04-15 MED ORDER — DIPHENHYDRAMINE HCL 25 MG PO CAPS
50.0000 mg | ORAL_CAPSULE | Freq: Three times a day (TID) | ORAL | Status: DC | PRN
  Administered 2023-04-16 – 2023-04-20 (×3): 50 mg via ORAL
  Filled 2023-04-15 (×3): qty 2

## 2023-04-15 MED ORDER — LAMOTRIGINE 200 MG PO TABS
400.0000 mg | ORAL_TABLET | Freq: Every day | ORAL | Status: DC
  Administered 2023-04-16: 400 mg via ORAL
  Filled 2023-04-15: qty 4
  Filled 2023-04-15: qty 2

## 2023-04-15 NOTE — Telephone Encounter (Signed)
Pharmacy Patient Advocate Encounter  Prior Authorization for Ubrelvy 100MG  tablets has been approved by [employer] [place] (ins).    PA # PA Case ID: [id number] Effective dates: 04/15/2023 through 07/08/2023  Copay is $0 per test bill.

## 2023-04-15 NOTE — ED Provider Notes (Signed)
FBC/OBS ASAP Discharge Summary  Date and Time: 04/15/2023 9:57 AM  Name: Shelby Gallegos  MRN:  [id number]   Discharge Diagnoses:  Final diagnoses:  Insomnia, unspecified type  Behavior concern in adult  Generalized anxiety disorder    Subjective: "I had a lot going on the other day and I did not react well. My sponsor broke up with me, I evicted my manipulative roommate, and I recently started a new job that I am still learning. I took five Trazodone and I drank alcohol with it. I have been sober for three years prior to that night."  Patient assessed by this nurse practitioner face to face, patient on the phone on the observation unit, writer reviewed record and discussed cased with Dr. Nelly Rout. Patient alert and oriented x4, pleasant and cooperative, presents with depressed mood, admits to ingestion of five Trazodone two days ago in an attempt to end her life in the context of multiple stressors as mentioned above. Patient unable to contract for safety to go home and meets criteria for inpatient admission.   Patient denies homicidal ideations, denies auditory/visual hallucinations and shows no evidence of responding to internal stimuli. No delusional thought process noted.   Patient is agreeable to go inpatient and is going voluntarily. Discussed risks/benefits of inpatient admission with patient and safety planning.   Stay Summary: HPI: 04/14/2023 20:36 Shelby Gallegos, 42 y/o with a history of bipolar disorder, anxiety, ADHD, prior suicide attempt and OD.  Presented to [facility] voluntarily after she was referred by her therapist.  According to patient she went to see her therapist today and she was telling her therapist that she could not sleep and took 5 trazodone which she did not remember the milligrams.  According to patient stressors include her new job and her being broken up with her AA sponsor.  According to the patient she has been hospitalized 3 times for psychiatric issues ranging  from 19 98,  2020 where she OD'd on 17 Xanax, and 2 NyQuil.  According to patient she just started a new job and she has been trying to catch up on her orientation however she is not getting enough patient report that she also had a disagreement with her roommate and she had to evict her and this also added to her stress level. According to patient she now lives alone.  Patient sees a psychiatry at [facility] and she sees a therapy weekly.     Face-to-face observation of patient, patient is alert and oriented x 4, speech is clear, maintained minimal eye contact.  Patient appearance is well groomed, patient is anxious and depressed, affect is flat congruent with mood.  Patient answers all questions appropriately and very cooperative with the interview process.  Patient denies that she is suicidal, denies HI, denies AVH or paranoia.  However given to the nature of why patient came in and what transpired last night patient was advised that staying overnight would a better choice for her this will allow her to get her mind off thing and refocus.  According to patient she has been sober for over 3 years but start drinking last night she drank 2 bottles of Smirnoff and also took her medicine at the same time.  Patient denies access to guns at this time.  According to patient she does not smoke but she is vape, patient denies any illicit drug use.  At this time patient does not seem to be influenced by external or internal stimuli. Writer discussed with  patient the need for overnight observation.  Patient in agreement.   Will restart pt home  medications    Recommend overnight observation   Total Time spent with patient: 30 minutes  Total Time spent with patient: 30 minutes  Past Psychiatric History: See above Past Medical History: See above Family History: None reported Family Psychiatric History: None reported Social History: Resides alone in [place], [place]. Patient has support of seeing a  weekly therapist, seeing psychiatry every three months, has social support of church members. Tobacco Cessation:  N/A, patient does not currently use tobacco products  Current Medications:  Current Facility-Administered Medications  Medication Dose Route Frequency Provider Last Rate Last Admin   acetaminophen (TYLENOL) tablet 650 mg  650 mg Oral Q6H PRN Sindy Guadeloupe, NP       ALPRAZolam Prudy Feeler) tablet 0.5 mg  0.5 mg Oral Daily PRN Sindy Guadeloupe, NP   0.5 mg at 04/14/23 2235   alum & mag hydroxide-simeth (MAALOX/MYLANTA) 200-200-20 MG/5ML suspension 30 mL  30 mL Oral Q4H PRN Sindy Guadeloupe, NP       amphetamine-dextroamphetamine (ADDERALL) tablet 20 mg  20 mg Oral TID with meals Sindy Guadeloupe, NP   20 mg at 04/15/23 0756   cariprazine (VRAYLAR) capsule 1.5 mg  1.5 mg Oral QPM Sindy Guadeloupe, NP   1.5 mg at 04/14/23 2235   lamoTRIgine (LAMICTAL) tablet 400 mg  400 mg Oral Daily Sindy Guadeloupe, NP   400 mg at 04/15/23 0912   OLANZapine zydis (ZYPREXA) disintegrating tablet 10 mg  10 mg Oral Q8H PRN Sindy Guadeloupe, NP       And   LORazepam (ATIVAN) tablet 1 mg  1 mg Oral PRN Sindy Guadeloupe, NP       And   ziprasidone (GEODON) injection 20 mg  20 mg Intramuscular PRN Sindy Guadeloupe, NP       lumateperone tosylate (CAPLYTA) capsule 42 mg  42 mg Oral Daily Sindy Guadeloupe, NP       magnesium hydroxide (MILK OF MAGNESIA) suspension 30 mL  30 mL Oral Daily PRN Sindy Guadeloupe, NP       prazosin (MINIPRESS) capsule 2-4 mg  2-4 mg Oral Daily PRN Sindy Guadeloupe, NP       Current Outpatient Medications  Medication Sig Dispense Refill   ALPRAZolam (XANAX) 0.5 MG tablet Take 0.5 mg by mouth daily as needed for anxiety.     amphetamine-dextroamphetamine (ADDERALL) 20 MG tablet Take 20 mg by mouth 3 (three) times daily.     aspirin-acetaminophen-caffeine (EXCEDRIN MIGRAINE) 250-250-65 MG tablet Take 1 tablet by mouth every 6 (six) hours as needed for migraine.     cariprazine (VRAYLAR) 3 MG capsule Take 3 mg by mouth  at bedtime.     cyanocobalamin (VITAMIN B12) 1000 MCG/ML injection Inject 1 mL (1,000 mcg total) into the muscle once for 1 dose. Inject 1 mL into the muscle once a week for 3 weeks and then once monthly (Patient taking differently: Inject 1,000 mcg into the skin every 30 (thirty) days.) 4 mL 5   Erenumab-aooe (AIMOVIG) 140 MG/ML SOAJ Inject 140 mg into the skin every 30 (thirty) days. 1.12 mL 11   lamoTRIgine (LAMICTAL) 200 MG tablet Take 400 mg by mouth at bedtime.     Rimegepant Sulfate (NURTEC) 75 MG TBDP Take 75 mg by mouth as needed (For migraines).     SUMAtriptan 6 MG/0.5ML SOAJ Inject 6 mg into the skin as needed. (Patient taking differently: Inject 6 mg into the skin  every 2 (two) hours as needed (For migraines or headaches).) 15 mL 3   Ubrogepant (UBRELVY) 100 MG TABS Take 1 tablet (100 mg total) by mouth as needed. (Patient taking differently: Take 100 mg by mouth as needed (For migraines).) 10 tablet 3    PTA Medications:  Facility Ordered Medications  Medication   acetaminophen (TYLENOL) tablet 650 mg   alum & mag hydroxide-simeth (MAALOX/MYLANTA) 200-200-20 MG/5ML suspension 30 mL   magnesium hydroxide (MILK OF MAGNESIA) suspension 30 mL   OLANZapine zydis (ZYPREXA) disintegrating tablet 10 mg   And   LORazepam (ATIVAN) tablet 1 mg   And   ziprasidone (GEODON) injection 20 mg   prazosin (MINIPRESS) capsule 2-4 mg   lumateperone tosylate (CAPLYTA) capsule 42 mg   lamoTRIgine (LAMICTAL) tablet 400 mg   cariprazine (VRAYLAR) capsule 1.5 mg   amphetamine-dextroamphetamine (ADDERALL) tablet 20 mg   ALPRAZolam (XANAX) tablet 0.5 mg   PTA Medications  Medication Sig   amphetamine-dextroamphetamine (ADDERALL) 20 MG tablet Take 20 mg by mouth 3 (three) times daily.   ALPRAZolam (XANAX) 0.5 MG tablet Take 0.5 mg by mouth daily as needed for anxiety.   aspirin-acetaminophen-caffeine (EXCEDRIN MIGRAINE) 250-250-65 MG tablet Take 1 tablet by mouth every 6 (six) hours as needed for  migraine.   cyanocobalamin (VITAMIN B12) 1000 MCG/ML injection Inject 1 mL (1,000 mcg total) into the muscle once for 1 dose. Inject 1 mL into the muscle once a week for 3 weeks and then once monthly (Patient taking differently: Inject 1,000 mcg into the skin every 30 (thirty) days.)   cariprazine (VRAYLAR) 3 MG capsule Take 3 mg by mouth at bedtime.   lamoTRIgine (LAMICTAL) 200 MG tablet Take 400 mg by mouth at bedtime.   Erenumab-aooe (AIMOVIG) 140 MG/ML SOAJ Inject 140 mg into the skin every 30 (thirty) days.   SUMAtriptan 6 MG/0.5ML SOAJ Inject 6 mg into the skin as needed. (Patient taking differently: Inject 6 mg into the skin every 2 (two) hours as needed (For migraines or headaches).)   Rimegepant Sulfate (NURTEC) 75 MG TBDP Take 75 mg by mouth as needed (For migraines).       03/30/2023    8:25 AM 01/14/2022   10:40 AM 01/13/2021    8:18 AM  Depression screen PHQ 2/9  Decreased Interest 1 1 1   Down, Depressed, Hopeless 2 1 2   PHQ - 2 Score 3 2 3   Altered sleeping 0 1 0  Tired, decreased energy 1 2 1   Change in appetite 0  0  Feeling bad or failure about yourself  1 0 0  Trouble concentrating 0 1 1  Moving slowly or fidgety/restless 0 0 0  Suicidal thoughts 0 0 0  PHQ-9 Score 5 6 5   Difficult doing work/chores Somewhat difficult Somewhat difficult Not difficult at all    Flowsheet Row ED from 04/14/2023 in [facility] ED from 03/21/2023 in [facility] ED to Hosp-Admission (Discharged) from 02/15/2019 in [facility]  C-SSRS RISK CATEGORY Error: Q3, 4, or 5 should not be populated when Q2 is No No Risk High Risk       Musculoskeletal  Strength & Muscle Tone: within normal limits Gait & Station: normal Patient leans: N/A  Psychiatric Specialty Exam  Presentation  General Appearance:  Appropriate for Environment; Casual  Eye Contact: Fair  Speech: Clear and Coherent  Speech  Volume: Normal  Handedness: Right   Mood and Affect  Mood: Anxious; Depressed  Affect: Appropriate   Thought Process  Thought Processes: Coherent  Descriptions of Associations:Intact  Orientation:Full (Time, Place and Person)  Thought Content:Logical  Diagnosis of Schizophrenia or Schizoaffective disorder in past: No    Hallucinations:Hallucinations: None  Ideas of Reference:None  Suicidal Thoughts:Suicidal Thoughts: No SI Passive Intent and/or Plan: With Plan  Homicidal Thoughts:Homicidal Thoughts: No   Sensorium  Memory: Immediate Good  Judgment: Impaired  Insight: Fair   Art therapist  Concentration: Good  Attention Span: Good  Recall: Good  Fund of Knowledge: Good  Language: Good   Psychomotor Activity  Psychomotor Activity: Psychomotor Activity: Normal   Assets  Assets: Desire for Improvement; Communication Skills; Social Support   Sleep  Sleep: Sleep: Fair Number of Hours of Sleep: 3   Nutritional Assessment (For OBS and FBC admissions only) Has the patient had a weight loss or gain of 10 pounds or more in the last 3 months?: No Has the patient had a decrease in food intake/or appetite?: No Does the patient have dental problems?: No Does the patient have eating habits or behaviors that may be indicators of an eating disorder including binging or inducing vomiting?: No Has the patient recently lost weight without trying?: 0 Has the patient been eating poorly because of a decreased appetite?: 0 Malnutrition Screening Tool Score: 0    Physical Exam  Physical Exam Vitals and nursing note reviewed.  Constitutional:      Appearance: Normal appearance. She is well-developed.  HENT:     Head: Normocephalic and atraumatic.     Nose: Nose normal.  Cardiovascular:     Rate and Rhythm: Normal rate.  Pulmonary:     Effort: Pulmonary effort is normal.  Musculoskeletal:        General: Normal range of motion.      Cervical back: Normal range of motion.  Neurological:     Mental Status: She is alert and oriented to person, place, and time.  Psychiatric:        Attention and Perception: Attention and perception normal.        Mood and Affect: Mood is depressed.        Speech: Speech normal.        Behavior: Behavior is cooperative.        Thought Content: Thought content normal.        Cognition and Memory: Cognition normal.    Review of Systems  Constitutional: Negative.   HENT: Negative.    Eyes: Negative.   Respiratory: Negative.    Cardiovascular: Negative.   Gastrointestinal: Negative.   Genitourinary: Negative.   Musculoskeletal: Negative.   Skin: Negative.   Neurological: Negative.   Psychiatric/Behavioral:  Positive for depression. Negative for suicidal ideas.    Blood pressure 119/87, pulse 90, temperature 98.1 F (36.7 C), temperature source Oral, resp. rate 19, last menstrual period 03/15/2023, SpO2 98 %. There is no height or weight on file to calculate BMI.  Demographic Factors:  Caucasian and Living alone  Loss Factors: Loss of significant relationship  Historical Factors: Prior suicide attempts and Impulsivity  Risk Reduction Factors:   Employed and Positive social support  Continued Clinical Symptoms:  Previous Psychiatric Diagnoses and Treatments  Cognitive Features That Contribute To Risk:  None    Suicide Risk:  Moderate:  Frequent suicidal ideation with limited intensity, and duration, some specificity in terms of plans, no associated intent, good self-control, limited dysphoria/symptomatology, some risk factors present, and identifiable protective factors, including available and accessible  social support.  Plan Of Care/Follow-up recommendations:  Inpatient psychiatric treatment recommended. Patient remains voluntary.  Disposition: Accepted to [facility] by Dr Sherron Flemings  Lenard Lance, FNP 04/15/2023, 9:57 AM

## 2023-04-15 NOTE — Telephone Encounter (Signed)
Pharmacy Patient Advocate Encounter   Received notification from [facility] that prior authorization for Ubrelvy 100MG  tablets is required/requested.   PA submitted on 04/15/2023 to (ins) [employer] [place] via [employer] or (Medicaid) confirmation # [id number] Status is pending

## 2023-04-15 NOTE — ED Notes (Signed)
Pt sleeping@this time. Breathing even and unlabored. Will continue to monitor for saftey 

## 2023-04-15 NOTE — Plan of Care (Signed)
Upon admission at approximately 1515 pm, pt is calm, cooperative, alert and oriented times 4. Pt denies current SI/HI/AVH. Pt states reason for being here, "I've had a lot of changes with evicting my roommate and a new job. My sponsor fired me for not going to meetings because of my new job. I have been working with my sponsor for a year." Pt states sponsor is for AA and states loss of sponsor, evicting roommate and new job are major stressors leading up to hospitalization. Pt lists boundaries and confidence as two areas she wants to work on while receiving treatment here. Although pt denies current SI, she states that she has SI a couple times a week. Upon skin assessment, pt is noted to have scars to both forearms bilateral. Skin assessment completed with Christian Mate, RN.

## 2023-04-15 NOTE — ED Notes (Signed)
Patient alert and oriented x 3. Denies SI/HI/AVH. Denies intent or plan to harm self or others. Routine conducted according to faculty protocol. Encourage patient to notify staff with any needs or concerns. Patient verbalized agreement and understanding. Will continue to monitor for safety. 

## 2023-04-15 NOTE — Plan of Care (Signed)
  Problem: Coping: Goal: Will verbalize feelings Outcome: Progressing   

## 2023-04-15 NOTE — Progress Notes (Signed)
   04/15/23 2000  Psych Admission Type (Psych Patients Only)  Admission Status Voluntary  Psychosocial Assessment  Patient Complaints Anxiety;Depression  Eye Contact Fair  Facial Expression Sad  Affect Depressed  Speech Unremarkable  Interaction Assertive  Motor Activity Other (Comment) (WDL)  Appearance/Hygiene Unremarkable  Behavior Characteristics Cooperative  Mood Sad  Thought Process  Coherency WDL  Content WDL  Delusions None reported or observed  Perception WDL  Hallucination None reported or observed  Judgment Poor  Confusion None  Danger to Self  Current suicidal ideation? Denies  Self-Injurious Behavior No self-injurious ideation or behavior indicators observed or expressed   Agreement Not to Harm Self Yes  Description of Agreement verbal  Danger to Others  Danger to Others None reported or observed   On assessment, patient is alert, oriented, and pleasant.  Patient reports this as her 2nd hospitalization.  Patient reports moderate anxiety and denies SI/HI/AVH.  Patient also reported a mild headache.  Patient contracts for safety.  Vitals were obtained.  PRN Tylenol, Xanax, and Prazosin were administered per [facility] per patient request.  Patient is safe on the unit with q15 minute safety checks.

## 2023-04-15 NOTE — [facility] Group Notes (Signed)
[facility] Group Notes:  (Nursing/MHT/Case Management/Adjunct)  Date:  04/15/2023  Time:  8:16 PM  Type of Therapy:   AA group  Participation Level:  Minimal  Participation Quality:  Appropriate  Affect:  Appropriate  Cognitive:  Appropriate  Insight:  Appropriate  Engagement in Group:  Engaged  Modes of Intervention:  Education  Summary of Progress/Problems: Attended AA meeting.  Noah Delaine 04/15/2023, 8:16 PM

## 2023-04-15 NOTE — ED Notes (Signed)
Shelby Gallegos was transported to [facility] via [employer] with her personal items and the appropriate paper work.

## 2023-04-15 NOTE — ED Notes (Signed)
Patient in milieu. Environment is secured. Will continue to monitor for safety. 

## 2023-04-15 NOTE — Progress Notes (Signed)
Pt was accepted to [facility] TODAY 04/15/2023. Bed assignment: 304-1  Pt meets inpatient criteria per Doran Heater, NP  Attending Physician will be Phineas Inches, MD  Report can be called to: - Adult unit: [phone]  Pt can arrive after 2 PM  Care Team Notified: [facility] [place] Knierim, RN, Doran Heater, NP, and Durwin Reges, RN  [place], [place]  04/15/2023 1:07 PM

## 2023-04-15 NOTE — Tx Team (Signed)
Initial Treatment Plan 04/15/2023 6:06 PM [name] WUJ:[id number]    PATIENT STRESSORS: Loss of AA sponsor   Occupational concerns     PATIENT STRENGTHS: Ability for insight  Average or above average intelligence  Capable of independent living  Communication skills  Financial means  General fund of knowledge  Motivation for treatment/growth  Religious Affiliation  Supportive family/friends    PATIENT IDENTIFIED PROBLEMS:                      DISCHARGE CRITERIA:  Improved stabilization in mood, thinking, and/or behavior Motivation to continue treatment in a less acute level of care Need for constant or close observation no longer present Verbal commitment to aftercare and medication compliance  PRELIMINARY DISCHARGE PLAN: Attend aftercare/continuing care group Outpatient therapy Return to previous living arrangement  PATIENT/FAMILY INVOLVEMENT: This treatment plan has been presented to and reviewed with the patient, [name].  The patient and family have been given the opportunity to ask questions and make suggestions.  Laurance Flatten, RN 04/15/2023, 6:06 PM

## 2023-04-15 NOTE — ED Notes (Signed)
Report called to nurse Annice Pih at [facility] and [employer] was notified.

## 2023-04-16 MED ORDER — SUMATRIPTAN SUCCINATE 6 MG/0.5ML ~~LOC~~ SOAJ: 6.0000 mg | Freq: Two times a day (BID) | SUBCUTANEOUS | Status: DC | PRN

## 2023-04-16 MED ORDER — SERTRALINE HCL 25 MG PO TABS
12.5000 mg | ORAL_TABLET | Freq: Every day | ORAL | Status: DC
  Administered 2023-04-16 – 2023-04-18 (×3): 12.5 mg via ORAL
  Filled 2023-04-16 (×4): qty 0.5
  Filled 2023-04-16: qty 1

## 2023-04-16 MED ORDER — SUMATRIPTAN SUCCINATE 6 MG/0.5ML ~~LOC~~ SOLN
6.0000 mg | Freq: Two times a day (BID) | SUBCUTANEOUS | Status: DC | PRN
  Administered 2023-04-16 – 2023-04-20 (×4): 6 mg via SUBCUTANEOUS
  Filled 2023-04-16 (×5): qty 0.5

## 2023-04-16 MED ORDER — NICOTINE 14 MG/24HR TD PT24
14.0000 mg | MEDICATED_PATCH | Freq: Every day | TRANSDERMAL | Status: DC
  Administered 2023-04-16 – 2023-04-21 (×5): 14 mg via TRANSDERMAL
  Filled 2023-04-16 (×7): qty 1

## 2023-04-16 MED ORDER — LAMOTRIGINE 100 MG PO TABS
400.0000 mg | ORAL_TABLET | Freq: Every day | ORAL | Status: DC
  Administered 2023-04-17 – 2023-04-21 (×5): 400 mg via ORAL
  Filled 2023-04-16 (×6): qty 4

## 2023-04-16 MED ORDER — PRAZOSIN HCL 2 MG PO CAPS
2.0000 mg | ORAL_CAPSULE | Freq: Every day | ORAL | Status: DC
  Administered 2023-04-16 – 2023-04-20 (×5): 2 mg via ORAL
  Filled 2023-04-16 (×2): qty 1
  Filled 2023-04-16: qty 2
  Filled 2023-04-16 (×3): qty 1
  Filled 2023-04-16: qty 2
  Filled 2023-04-16: qty 1

## 2023-04-16 NOTE — Progress Notes (Signed)
The patient rated her day as a 7 or 8 out of a possible 10. Her positive event for the day is that she went to the gym and played "corn hole" with her peers.

## 2023-04-16 NOTE — Progress Notes (Signed)
   04/16/23 0546  15 Minute Checks  Location Bedroom  Visual Appearance Calm  Behavior Sleeping  Sleep (Behavioral Health Patients Only)  Calculate sleep? (Click Yes once per 24 hr at 0600 safety check) Yes  Documented sleep last 24 hours 7.75

## 2023-04-16 NOTE — Progress Notes (Signed)
    04/16/23 1000  Psych Admission Type (Psych Patients Only)  Admission Status Voluntary  Psychosocial Assessment  Patient Complaints Other (Comment) (Impending migraine)  Eye Contact Fair  Facial Expression Flat  Affect Depressed  Speech Unremarkable  Interaction Assertive  Appearance/Hygiene Unremarkable  Behavior Characteristics Cooperative;Calm  Mood Depressed  Thought Process  Coherency WDL  Content WDL  Delusions None reported or observed  Perception WDL  Hallucination None reported or observed  Judgment Poor  Confusion None  Danger to Self  Current suicidal ideation? Denies  Self-Injurious Behavior No self-injurious ideation or behavior indicators observed or expressed   Agreement Not to Harm Self Yes  Description of Agreement Verbal  Danger to Others  Danger to Others None reported or observed
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
## 2023-04-16 NOTE — Progress Notes (Signed)
   04/16/23 0625  Vital Signs  Pulse Rate (!) 131  Pulse Rate Source Monitor  Resp 18  BP 117/70  BP Location Left Arm  BP Method Automatic  Patient Position (if appropriate) Standing  Oxygen Therapy  SpO2 95 %   Patient presented with complaint of a bad migraine, pain score 8/10.  Administered PRN Tylenol.  Notified provider requesting migraine medication.

## 2023-04-16 NOTE — [facility] Suicide Risk Assessment (Signed)
Suicide Risk Assessment  Admission Assessment    [facility] Admission Suicide Risk Assessment   Nursing information obtained from:  Patient Demographic factors:  Caucasian, Living alone Current Mental Status:   (denies current SI thoughts) Loss Factors:  Loss of significant relationship Historical Factors:  Prior suicide attempts Risk Reduction Factors:  Religious beliefs about death, Employed, Positive social support, Positive coping skills or problem solving skills  Total Time spent with patient: 1 hour Principal Problem: Bipolar disorder, current episode depressed, severe (HCC) Diagnosis:  Principal Problem:   Bipolar disorder, current episode depressed, severe (HCC) Active Problems:   Headache, migraine   ADHD (attention deficit hyperactivity disorder)   Suicide attempt (HCC)   Nicotine vapor product user  Subjective Data: Shelby Gallegos is a 42 year old female with a past psychiatric history of bipolar 1 disorder, ADHD, and prior suicide attempts via OD who presented voluntarily from [facility] at the referral of her therapist for suicide attempt via overdose on trazodone and alcohol.   On Chart Review: Patient with 3 prior psychiatric hospitalizations between 1998-2020.  2020 admission due to overdose on 70 Xanax pills and 2 bottles of NyQuil as well as significant cuts to bilateral forearms.   Current Outpatient (Home) Medication List:  Prazosin 4 mg nightly Adderall 20 mg 3 times daily Xanax 0.5 mg as needed anxiety Lamictal 400 mg nightly Vraylar 3 mg nightly Imitrex IM as needed for migraines Nurtec as needed for migraines Aimovig monthly for migraines; last received 2 weeks ago  Continued Clinical Symptoms:    The "Alcohol Use Disorders Identification Test", Guidelines for Use in Primary Care, Second Edition.  World Science writer [facility]). Score between 0-7:  no or low risk or alcohol related problems. Score between 8-15:  moderate risk of alcohol related problems. Score  between 16-19:  high risk of alcohol related problems. Score 20 or above:  warrants further diagnostic evaluation for alcohol dependence and treatment.   CLINICAL FACTORS:   Bipolar Disorder:   Depressive phase Alcohol/Substance Abuse/Dependencies Previous Psychiatric Diagnoses and Treatments Medical Diagnoses and Treatments/Surgeries   Musculoskeletal: Strength & Muscle Tone: within normal limits Gait & Station: normal Patient leans: N/A  Psychiatric Specialty Exam:  Presentation  General Appearance:  Appropriate for Environment; Casual  Eye Contact: Fair  Speech: Clear and Coherent  Speech Volume: Normal  Handedness: Right   Mood and Affect  Mood: Anxious; Depressed  Affect: Appropriate   Thought Process  Thought Processes: Coherent  Descriptions of Associations:Intact  Orientation:Full (Time, Place and Person)  Thought Content:Logical  History of Schizophrenia/Schizoaffective disorder:No  Duration of Psychotic Symptoms:No data recorded Hallucinations:Hallucinations: None  Ideas of Reference:None  Suicidal Thoughts:Suicidal Thoughts: No  Homicidal Thoughts:Homicidal Thoughts: No   Sensorium  Memory: Immediate Good  Judgment: Impaired  Insight: Fair   Art therapist  Concentration: Good  Attention Span: Good  Recall: Good  Fund of Knowledge: Good  Language: Good   Psychomotor Activity  Psychomotor Activity: Psychomotor Activity: Normal   Assets  Assets: Desire for Improvement; Communication Skills; Social Support   Sleep  Sleep: Sleep: Fair    Physical Exam: Physical Exam Vitals reviewed.  Constitutional:      General: She is not in acute distress. HENT:     Head: Normocephalic and atraumatic.  Pulmonary:     Effort: Pulmonary effort is normal.  Skin:    General: Skin is warm and dry.     Comments: Multiple well-healed scars to bilateral forearms from previous suicide attempt  Neurological:  General: No focal deficit present.     Mental Status: She is alert and oriented to person, place, and time.     Motor: No weakness.     Gait: Gait normal.      Review of Systems  Cardiovascular:  Negative for chest pain.  Gastrointestinal: Negative.   Genitourinary: Negative.   Neurological:  Positive for headaches. Negative for dizziness, tremors and seizures.  Blood pressure 117/70, pulse 77, temperature 98.3 F (36.8 C), temperature source Oral, resp. rate 18, height 5\' 7"  (1.702 m), weight 105.6 kg, last menstrual period 03/15/2023, SpO2 97 %. Body mass index is 36.46 kg/m.   COGNITIVE FEATURES THAT CONTRIBUTE TO RISK:  Thought constriction (tunnel vision)    SUICIDE RISK:   Severe:  Frequent, intense, and enduring suicidal ideation, specific plan, no subjective intent, but some objective markers of intent (i.e., choice of lethal method), the method is accessible, some limited preparatory behavior, evidence of impaired self-control, severe dysphoria/symptomatology, multiple risk factors present, and few if any protective factors, particularly a lack of social support.  PLAN OF CARE:   See H&P for full plan of care  I certify that inpatient services furnished can reasonably be expected to improve the patient's condition.   Lamar Sprinkles, MD 04/16/2023, 2:16 PM

## 2023-04-17 NOTE — Plan of Care (Signed)
Nurse discussed coping skills with patient.  

## 2023-04-17 NOTE — Group Note (Signed)
Date:  04/17/2023 Time:  6:39 PM  Group Topic/Focus:  Self Care:   The focus of this group is to help patients understand the importance of self-care in order to improve or restore emotional, physical, spiritual, interpersonal, and financial health.    Participation Level:  Active  Participation Quality:  Appropriate  Affect:  Appropriate  Cognitive:  Appropriate  Insight: Appropriate  Engagement in Group:  Engaged  Modes of Intervention:  Education  Additional Comments:     Reymundo Poll 04/17/2023, 6:39 PM

## 2023-04-17 NOTE — Plan of Care (Addendum)
Patient has been very cooperative and pleasant today.

## 2023-04-17 NOTE — Progress Notes (Signed)
   04/16/23 2200  Psych Admission Type (Psych Patients Only)  Admission Status Voluntary  Psychosocial Assessment  Patient Complaints Anxiety  Eye Contact Fair  Facial Expression Flat  Affect Appropriate to circumstance  Speech Logical/coherent  Interaction Assertive  Motor Activity Slow  Appearance/Hygiene Unremarkable  Behavior Characteristics Cooperative;Appropriate to situation  Mood Pleasant  Thought Process  Coherency WDL  Content WDL  Delusions None reported or observed  Perception WDL  Hallucination None reported or observed  Judgment Poor  Confusion None  Danger to Self  Current suicidal ideation? Passive  Self-Injurious Behavior No self-injurious ideation or behavior indicators observed or expressed   Agreement Not to Harm Self Yes  Description of Agreement verbal  Danger to Others  Danger to Others None reported or observed   Pt presented anxious and reported being suicidal and could not tell what was the trigger. Pt was able to contract fro safety but required agitation protocol. Pt went bed after wards calm, will continue to monitor.

## 2023-04-17 NOTE — Group Note (Signed)
Date:  04/17/2023 Time:  5:22 PM  Group Topic/Focus:  Orientation:   The focus of this group is to educate the patient on the purpose and policies of crisis stabilization and provide a format to answer questions about their admission.  The group details unit policies and expectations of patients while admitted.    Participation Level:  Active  Participation Quality:  Appropriate  Affect:  Appropriate  Cognitive:  Appropriate  Insight: Appropriate  Engagement in Group:  Engaged  Modes of Intervention:  Education  Additional Comments:     Reymundo Poll 04/17/2023, 5:22 PM
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
## 2023-04-17 NOTE — [facility] Group Notes (Signed)
Adult Psychoeducational Group Note  Date:  04/17/2023 Time:  8:59 PM  Group Topic/Focus:  Wrap-Up Group:   The focus of this group is to help patients review their daily goal of treatment and discuss progress on daily workbooks.  Participation Level:  Active  Participation Quality:  Appropriate  Affect:  Appropriate  Cognitive:  Appropriate  Insight: Appropriate  Engagement in Group:  Engaged  Modes of Intervention:  Discussion and Support  Additional Comments:  Pt attended the evening group and responded to all discussion prompts from the Writer. Pt shared that today was a good day on the unit, the highlight of which was receiving a supportive visit from a church friend. On the subject of goals for the coming week, Shelby Gallegos mentioned "wanting to make progress on working on myself." Pt rated her day a 7 out of 10.  Shelby Gallegos 04/17/2023, 8:59 PM

## 2023-04-17 NOTE — Plan of Care (Signed)
  Problem: Activity: Goal: Interest or engagement in activities will improve Outcome: Progressing   Problem: Coping: Goal: Ability to demonstrate self-control will improve Outcome: Progressing   Problem: Coping: Goal: Will verbalize feelings Outcome: Progressing   Problem: Medication: Goal: Compliance with prescribed medication regimen will improve Outcome: Progressing

## 2023-04-17 NOTE — Progress Notes (Signed)
   04/17/23 2115  Psych Admission Type (Psych Patients Only)  Admission Status Voluntary  Psychosocial Assessment  Patient Complaints Anxiety;Depression  Eye Contact Fair  Facial Expression Flat  Affect Anxious  Speech Logical/coherent  Interaction Assertive  Motor Activity Other (Comment) (WDL)  Appearance/Hygiene Unremarkable  Behavior Characteristics Cooperative;Appropriate to situation  Mood Pleasant;Anxious  Thought Process  Coherency WDL  Content WDL  Delusions None reported or observed  Perception WDL  Hallucination None reported or observed  Judgment Poor  Confusion None  Danger to Self  Current suicidal ideation? Passive  Self-Injurious Behavior Some self-injurious ideation observed or expressed.  No lethal plan expressed   Agreement Not to Harm Self Yes  Description of Agreement verbal  Danger to Others  Danger to Others None reported or observed    During assessment, patient reported feeling anxious.  Administered PRN Hydroxyzine per [facility] per patient request.

## 2023-04-17 NOTE — Progress Notes (Signed)
D:  Patient's self inventory sheet, patient sleeps good, sleep medication helpful.  Good appetite, low energy level, poor concentration.  Rated depression 8, hopeless 6, anxiety 10.  Does have nicotine withdrawals, sedation and agitation.  SI, sometimes, contracts  for safety.  Physical problems, headaches, worst pain #5 in past 24 hours. Haskell Flirt and being less depressed.  Plans not to isolate as much.  No discharge plan yet.   A:  Medications administered per MD orders.  Emotional support and encouragement given patient. R:  Denied SI and HI while talking to nurse this morning.  Denied A/V hallucinations.  Contracts for safety.  Safety maintained with 15 minute checks.

## 2023-04-18 ENCOUNTER — Encounter (HOSPITAL_COMMUNITY): Payer: Self-pay

## 2023-04-18 DIAGNOSIS — F314 Bipolar disorder, current episode depressed, severe, without psychotic features: Principal | ICD-10-CM

## 2023-04-18 NOTE — BH IP Treatment Plan (Signed)
Interdisciplinary Treatment and Diagnostic Plan Update  04/18/2023 Time of Session: 10:50am [place] MRN: [id number]  Principal Diagnosis: Bipolar disorder, current episode depressed, severe (HCC)  Secondary Diagnoses: Principal Problem:   Bipolar disorder, current episode depressed, severe (HCC) Active Problems:   Headache, migraine   ADHD (attention deficit hyperactivity disorder)   Suicide attempt (HCC)   Nicotine vapor product user   Current Medications:  Current Facility-Administered Medications  Medication Dose Route Frequency Provider Last Rate Last Admin   acetaminophen (TYLENOL) tablet 650 mg  650 mg Oral Q6H PRN Lenard Lance, FNP   650 mg at 04/18/23 4098   ALPRAZolam Prudy Feeler) tablet 0.5 mg  0.5 mg Oral Daily PRN Lenard Lance, FNP   0.5 mg at 04/17/23 2115   alum & mag hydroxide-simeth (MAALOX/MYLANTA) 200-200-20 MG/5ML suspension 30 mL  30 mL Oral Q4H PRN Lenard Lance, FNP       cariprazine (VRAYLAR) capsule 1.5 mg  1.5 mg Oral QPM Lenard Lance, FNP   1.5 mg at 04/17/23 1848   diphenhydrAMINE (BENADRYL) capsule 50 mg  50 mg Oral TID PRN Lenard Lance, FNP   50 mg at 04/16/23 2048   Or   diphenhydrAMINE (BENADRYL) injection 50 mg  50 mg Intramuscular TID PRN Lenard Lance, FNP       haloperidol (HALDOL) tablet 5 mg  5 mg Oral TID PRN Lenard Lance, FNP   5 mg at 04/16/23 2048   Or   haloperidol lactate (HALDOL) injection 5 mg  5 mg Intramuscular TID PRN Lenard Lance, FNP       lamoTRIgine (LAMICTAL) tablet 400 mg  400 mg Oral Daily Massengill, Harrold Donath, MD   400 mg at 04/18/23 0810   LORazepam (ATIVAN) tablet 2 mg  2 mg Oral TID PRN Lenard Lance, FNP   2 mg at 04/16/23 2048   Or   LORazepam (ATIVAN) injection 2 mg  2 mg Intramuscular TID PRN Lenard Lance, FNP       magnesium hydroxide (MILK OF MAGNESIA) suspension 30 mL  30 mL Oral Daily PRN Lenard Lance, FNP       nicotine (NICODERM CQ - dosed in mg/24 hours) patch 14 mg  14 mg Transdermal Daily Lamar Sprinkles,  MD   14 mg at 04/17/23 0749   prazosin (MINIPRESS) capsule 2 mg  2 mg Oral QHS Lamar Sprinkles, MD   2 mg at 04/17/23 2115   sertraline (ZOLOFT) tablet 12.5 mg  12.5 mg Oral QHS Lamar Sprinkles, MD   12.5 mg at 04/17/23 2116   SUMAtriptan (IMITREX) injection 6 mg  6 mg Subcutaneous BID PRN Bobbitt, Shalon E, NP   6 mg at 04/17/23 1421   PTA Medications: Medications Prior to Admission  Medication Sig Dispense Refill Last Dose   ALPRAZolam (XANAX) 0.5 MG tablet Take 0.5 mg by mouth daily as needed for anxiety.   04/15/2023   amphetamine-dextroamphetamine (ADDERALL) 20 MG tablet Take 20 mg by mouth 3 (three) times daily.   04/15/2023   aspirin-acetaminophen-caffeine (EXCEDRIN MIGRAINE) 250-250-65 MG tablet Take 1 tablet by mouth every 6 (six) hours as needed for migraine.   Past Week   cariprazine (VRAYLAR) 3 MG capsule Take 3 mg by mouth at bedtime.   04/15/2023   cyanocobalamin (VITAMIN B12) 1000 MCG/ML injection Inject 1 mL (1,000 mcg total) into the muscle once for 1 dose. Inject 1 mL into the muscle once a week for 3 weeks and then once monthly (  Patient taking differently: Inject 1,000 mcg into the skin every 30 (thirty) days.) 4 mL 5 Past Month   Erenumab-aooe (AIMOVIG) 140 MG/ML SOAJ Inject 140 mg into the skin every 30 (thirty) days. 1.12 mL 11 Past Month   ibuprofen (ADVIL) 200 MG tablet Take 400 mg by mouth every 6 (six) hours as needed for headache.   Past Month   lamoTRIgine (LAMICTAL) 200 MG tablet Take 400 mg by mouth at bedtime.   04/15/2023   SUMAtriptan 6 MG/0.5ML SOAJ Inject 6 mg into the skin as needed. (Patient taking differently: Inject 6 mg into the skin every 2 (two) hours as needed (For migraines or headaches).) 15 mL 3 Past Month   Rimegepant Sulfate (NURTEC) 75 MG TBDP Take 75 mg by mouth as needed (For migraines).      Ubrogepant (UBRELVY) 100 MG TABS Take 1 tablet (100 mg total) by mouth as needed. (Patient taking differently: Take 100 mg by mouth as needed (For migraines).) 10  tablet 3     Patient Stressors: Loss of AA sponsor   Occupational concerns    Patient Strengths: Ability for insight  Average or above average intelligence  Capable of independent living  Communication skills  Financial means  General fund of knowledge  Motivation for treatment/growth  Religious Affiliation  Supportive family/friends   Treatment Modalities: Medication Management, Group therapy, Case management,  1 to 1 session with clinician, Psychoeducation, Recreational therapy.   Physician Treatment Plan for Primary Diagnosis: Bipolar disorder, current episode depressed, severe (HCC) Long Term Goal(s): Improvement in symptoms so as ready for discharge   Short Term Goals: Ability to identify changes in lifestyle to reduce recurrence of condition will improve Ability to verbalize feelings will improve Ability to disclose and discuss suicidal ideas Ability to demonstrate self-control will improve Ability to identify and develop effective coping behaviors will improve Ability to maintain clinical measurements within normal limits will improve Compliance with prescribed medications will improve  Medication Management: Evaluate patient's response, side effects, and tolerance of medication regimen.  Therapeutic Interventions: 1 to 1 sessions, Unit Group sessions and Medication administration.  Evaluation of Outcomes: Progressing  Physician Treatment Plan for Secondary Diagnosis: Principal Problem:   Bipolar disorder, current episode depressed, severe (HCC) Active Problems:   Headache, migraine   ADHD (attention deficit hyperactivity disorder)   Suicide attempt (HCC)   Nicotine vapor product user  Long Term Goal(s): Improvement in symptoms so as ready for discharge   Short Term Goals: Ability to identify changes in lifestyle to reduce recurrence of condition will improve Ability to verbalize feelings will improve Ability to disclose and discuss suicidal ideas Ability to  demonstrate self-control will improve Ability to identify and develop effective coping behaviors will improve Ability to maintain clinical measurements within normal limits will improve Compliance with prescribed medications will improve     Medication Management: Evaluate patient's response, side effects, and tolerance of medication regimen.  Therapeutic Interventions: 1 to 1 sessions, Unit Group sessions and Medication administration.  Evaluation of Outcomes: Progressing   RN Treatment Plan for Primary Diagnosis: Bipolar disorder, current episode depressed, severe (HCC) Long Term Goal(s): Knowledge of disease and therapeutic regimen to maintain health will improve  Short Term Goals: Ability to remain free from injury will improve, Ability to verbalize frustration and anger appropriately will improve, Ability to demonstrate self-control, Ability to participate in decision making will improve, Ability to verbalize feelings will improve, Ability to disclose and discuss suicidal ideas, Ability to identify and develop effective coping  behaviors will improve, and Compliance with prescribed medications will improve  Medication Management: RN will administer medications as ordered by provider, will assess and evaluate patient's response and provide education to patient for prescribed medication. RN will report any adverse and/or side effects to prescribing provider.  Therapeutic Interventions: 1 on 1 counseling sessions, Psychoeducation, Medication administration, Evaluate responses to treatment, Monitor vital signs and CBGs as ordered, Perform/monitor CIWA, COWS, AIMS and Fall Risk screenings as ordered, Perform wound care treatments as ordered.  Evaluation of Outcomes: Progressing   LCSW Treatment Plan for Primary Diagnosis: Bipolar disorder, current episode depressed, severe (HCC) Long Term Goal(s): Safe transition to appropriate next level of care at discharge, Engage patient in therapeutic  group addressing interpersonal concerns.  Short Term Goals: Engage patient in aftercare planning with referrals and resources, Increase social support, Increase ability to appropriately verbalize feelings, Increase emotional regulation, Facilitate acceptance of mental health diagnosis and concerns, Facilitate patient progression through stages of change regarding substance use diagnoses and concerns, Identify triggers associated with mental health/substance abuse issues, and Increase skills for wellness and recovery  Therapeutic Interventions: Assess for all discharge needs, 1 to 1 time with Social worker, Explore available resources and support systems, Assess for adequacy in community support network, Educate family and significant other(s) on suicide prevention, Complete Psychosocial Assessment, Interpersonal group therapy.  Evaluation of Outcomes: Progressing   Progress in Treatment: Attending groups: Yes. Participating in groups: Yes. Taking medication as prescribed: Yes. Toleration medication: Yes. Family/Significant other contact made: Yes, individual(s) contacted:  [name], [phone] Patient understands diagnosis: Yes. Discussing patient identified problems/goals with staff: Yes. Medical problems stabilized or resolved: Yes. Denies suicidal/homicidal ideation: Yes. Issues/concerns per patient self-inventory: No.   New problem(s) identified: No, Describe:  none reported   New Short Term/Long Term Goal(s): detox, medication management for mood stabilization; elimination of SI thoughts; development of comprehensive mental wellness/sobriety plan     Patient Goals:  Pt states, "I need to stay away from suicidal thoughts and gain more confidence"  Discharge Plan or Barriers: Patient recently admitted. CSW will continue to follow and assess for appropriate referrals and possible discharge planning.    Reason for Continuation of Hospitalization:  Anxiety Depression Medication stabilization Suicidal ideation  Estimated Length of Stay: 3-5 days  Last 3 [place] Suicide Severity Risk Score: Flowsheet Row Admission (Current) [facility] from [facility]14/2023 in [facility] ED from 03/21/2023 in [facility] Emergency Department at [facility]  C-SSRS RISK CATEGORY Low Risk Error: Q3, 4, or 5 should not be populated when Q2 is No No Risk       Last PHQ 2/9 Scores:    03/30/2023    8:25 AM 01/14/2022   10:40 AM 01/13/2021    8:18 AM  Depression screen PHQ 2/9  Decreased Interest 1 1 1   Down, Depressed, Hopeless 2 1 2   PHQ - 2 Score 3 2 3   Altered sleeping 0 1 0  Tired, decreased energy 1 2 1   Change in appetite 0  0  Feeling bad or failure about yourself  1 0 0  Trouble concentrating 0 1 1  Moving slowly or fidgety/restless 0 0 0  Suicidal thoughts 0 0 0  PHQ-9 Score 5 6 5   Difficult doing work/chores Somewhat difficult Somewhat difficult Not difficult at all    Scribe for Treatment Team: Beatris Si, LCSW 04/18/2023 2:45 PM

## 2023-04-18 NOTE — Progress Notes (Addendum)
Patient denies SI/HI/AVH this morning but presents with a lot of pain due to migraine.sumatriptan SQ was administered and patient reports relief.  Patient rates depression 7/10, hopelessness 6/10 and anxiety 9/10 today. Patient is pleasant and interacting in the milieu and attending group. Patient attending to ADLs by taking a shower which "took a lot of self motivation" Patient remains safe on the unit with 15 minute safety checks ongoing.   04/18/23 1000  Psych Admission Type (Psych Patients Only)  Admission Status Voluntary  Psychosocial Assessment  Patient Complaints Anxiety;Depression  Eye Contact Fair  Facial Expression Flat;Pained  Affect Anxious;Preoccupied  Speech Logical/coherent  Interaction Assertive  Motor Activity Other (Comment) (WDL)  Appearance/Hygiene Unremarkable  Behavior Characteristics Cooperative;Appropriate to situation  Mood Pleasant;Preoccupied;Depressed;Anxious  Thought Process  Coherency WDL  Content WDL  Delusions None reported or observed  Perception WDL  Hallucination None reported or observed  Judgment Poor  Confusion None  Danger to Self  Current suicidal ideation? Denies  Self-Injurious Behavior No self-injurious ideation or behavior indicators observed or expressed   Agreement Not to Harm Self Yes  Description of Agreement verbal  Danger to Others  Danger to Others None reported or observed

## 2023-04-18 NOTE — Progress Notes (Signed)
[facility] MD Progress Note  04/18/2023 1:56 PM Shelby Gallegos  MRN:  [id number]  Principal Problem: Bipolar disorder, current episode depressed, severe (HCC) Diagnosis: Principal Problem:   Bipolar disorder, current episode depressed, severe (HCC) Active Problems:   Headache, migraine   ADHD (attention deficit hyperactivity disorder)   Suicide attempt (HCC)   Nicotine vapor product user  Subjective:  Shelby Gallegos is a 42 year old female with a past psychiatric history of bipolar 1 disorder, ADHD, and prior suicide attempts via OD who presented voluntarily from [name] at the referral of her therapist for suicide attempt via overdose on trazodone and alcohol.   On Evaluation Today:  The patient reports feeling tired today.  It is noted that her thought process is linear and logical.  Her affect appears depressed.  It is quite possible that her lethargy is related to her as needed migraine medication, Imitrex.  She reports a stable level of depression and denies significant change in her mood since admission.  She denies side effects from her psychiatric medications.  She reports passing thoughts of suicide, with no formed plan or intention.  She reports that [birth year] is her birthday and she is hopeful that she can be discharged before then.  She reports attendance in groups and finding these helpful.  Total Time spent with patient: 20 min  Past Psychiatric History: Reviewed from H&P, no changes  Past Medical History:  Past Medical History:  Diagnosis Date   ADHD (attention deficit hyperactivity disorder)    Bipolar 1 disorder (HCC)    Migraine    Suicide attempt by drug overdose (HCC) 02/15/2019    Past Surgical History:  Procedure Laterality Date   NO PAST SURGERIES     TENDON REPAIR Bilateral 02/20/2019   Procedure: TENDON REPAIR BILATERAL FOREARMS;  Surgeon: [name], MD;  Location: [facility];  Service: Orthopedics;  Laterality: Bilateral;   WOUND EXPLORATION Bilateral  02/20/2019   Procedure: WOUND EXPLORATION;  Surgeon: [name], MD;  Location: [facility];  Service: Orthopedics;  Laterality: Bilateral;   Family History:  Family History  Problem Relation Age of Onset   Atrial fibrillation Mother    Bipolar disorder Paternal Uncle    Alcohol abuse Paternal Uncle    Depression Paternal Uncle    OCD Paternal Uncle    Family Psychiatric  History: Reviewed from H&P, no changes Social History:  Social History   Substance and Sexual Activity  Alcohol Use Not Currently   Alcohol/week: 2.0 standard drinks of alcohol   Types: 2 Cans of beer per week   Comment: pt states she has been sober since 2020 but relapsed 04/13/23 and had 2 wine coolers     Social History   Substance and Sexual Activity  Drug Use No    Social History   Socioeconomic History   Marital status: Single    Spouse name: Not on file   Number of children: 0   Years of education: Not on file   Highest education level: Bachelor's degree (e.g., BA, AB, BS)  Occupational History   Not on file  Tobacco Use   Smoking status: Never   Smokeless tobacco: Never  Vaping Use   Vaping Use: Every day   Start date: 11/29/2021   Substances: Nicotine, Flavoring  Substance and Sexual Activity   Alcohol use: Not Currently    Alcohol/week: 2.0 standard drinks of alcohol    Types: 2 Cans of beer per week    Comment: pt states  she has been sober since 2020 but relapsed 04/13/23 and had 2 wine coolers   Drug use: No   Sexual activity: Not Currently    Birth control/protection: None  Other Topics Concern   Not on file  Social History Narrative   Not on file   Social Determinants of Health   Financial Resource Strain: Low Risk  (03/30/2023)   Overall Financial Resource Strain (CARDIA)    Difficulty of Paying Living Expenses: Not very hard  Food Insecurity: No Food Insecurity (04/15/2023)   Hunger Vital Sign    Worried About Running Out of Food in the Last Year: Never true     Ran Out of Food in the Last Year: Never true  Transportation Needs: No Transportation Needs (04/15/2023)   PRAPARE - Administrator, Civil Service (Medical): No    Lack of Transportation (Non-Medical): No  Physical Activity: Insufficiently Active (03/30/2023)   Exercise Vital Sign    Days of Exercise per Week: 2 days    Minutes of Exercise per Session: 20 min  Stress: Stress Concern Present (03/30/2023)   [employer] of Occupational Health - Occupational Stress Questionnaire    Feeling of Stress : Rather much  Social Connections: Moderately Integrated (03/30/2023)   Social Connection and Isolation Panel [NHANES]    Frequency of Communication with Friends and Family: More than three times a week    Frequency of Social Gatherings with Friends and Family: More than three times a week    Attends Religious Services: More than 4 times per year    Active Member of [employer] or Organizations: Yes    Attends Engineer, structural: More than 4 times per year    Marital Status: Never married   Additional Social History:                         Sleep: Good  Appetite:  Good  Current Medications: Current Facility-Administered Medications  Medication Dose Route Frequency Provider Last Rate Last Admin   acetaminophen (TYLENOL) tablet 650 mg  650 mg Oral Q6H PRN Lenard Lance, FNP   650 mg at 04/18/23 1610   ALPRAZolam (XANAX) tablet 0.5 mg  0.5 mg Oral Daily PRN Lenard Lance, FNP   0.5 mg at 04/17/23 2115   alum & mag hydroxide-simeth (MAALOX/MYLANTA) 200-200-20 MG/5ML suspension 30 mL  30 mL Oral Q4H PRN Lenard Lance, FNP       cariprazine (VRAYLAR) capsule 1.5 mg  1.5 mg Oral QPM Lenard Lance, FNP   1.5 mg at 04/17/23 1848   diphenhydrAMINE (BENADRYL) capsule 50 mg  50 mg Oral TID PRN Lenard Lance, FNP   50 mg at 04/16/23 2048   Or   diphenhydrAMINE (BENADRYL) injection 50 mg  50 mg Intramuscular TID PRN Lenard Lance, FNP       haloperidol (HALDOL) tablet 5 mg  5  mg Oral TID PRN Lenard Lance, FNP   5 mg at 04/16/23 2048   Or   haloperidol lactate (HALDOL) injection 5 mg  5 mg Intramuscular TID PRN Lenard Lance, FNP       lamoTRIgine (LAMICTAL) tablet 400 mg  400 mg Oral Daily Massengill, Nathan, MD   400 mg at 04/18/23 0810   LORazepam (ATIVAN) tablet 2 mg  2 mg Oral TID PRN Lenard Lance, FNP   2 mg at 04/16/23 2048   Or   LORazepam (ATIVAN) injection 2 mg  2 mg Intramuscular TID PRN Lenard Lance, FNP       magnesium hydroxide (MILK OF MAGNESIA) suspension 30 mL  30 mL Oral Daily PRN Lenard Lance, FNP       nicotine (NICODERM CQ - dosed in mg/24 hours) patch 14 mg  14 mg Transdermal Daily Lamar Sprinkles, MD   14 mg at 04/17/23 0749   prazosin (MINIPRESS) capsule 2 mg  2 mg Oral QHS Lamar Sprinkles, MD   2 mg at 04/17/23 2115   sertraline (ZOLOFT) tablet 12.5 mg  12.5 mg Oral QHS Lamar Sprinkles, MD   12.5 mg at 04/17/23 2116   SUMAtriptan (IMITREX) injection 6 mg  6 mg Subcutaneous BID PRN Bobbitt, Shalon E, NP   6 mg at 04/17/23 1421    Lab Results:  No results found for this or any previous visit (from the past 48 hour(s)).  Blood Alcohol level:  Lab Results  Component Value Date   ETH <10 04/14/2023   ETH <10 02/15/2019    Metabolic Disorder Labs: Lab Results  Component Value Date   HGBA1C 5.8 (H) 04/14/2023   MPG 119.76 04/14/2023   MPG 114.02 02/19/2019   No results found for: "PROLACTIN" Lab Results  Component Value Date   CHOL 224 (H) 04/14/2023   TRIG 142 04/14/2023   HDL 55 04/14/2023   CHOLHDL 4.1 04/14/2023   VLDL 28 04/14/2023   LDLCALC 141 (H) 04/14/2023   LDLCALC 130 (H) 03/31/2023   Psychiatric Specialty Exam: Physical Exam Constitutional:      Appearance: the patient is not toxic-appearing.  Pulmonary:     Effort: Pulmonary effort is normal.  Neurological:     General: No focal deficit present.     Mental Status: the patient is alert and oriented to person, place, and time.   Review of Systems   Respiratory:  Negative for shortness of breath.   Cardiovascular:  Negative for chest pain.  Gastrointestinal:  Negative for abdominal pain, constipation, diarrhea, nausea and vomiting.  Neurological:  Negative for headaches.      BP 111/64   Pulse 99   Temp 98.5 F (36.9 C) (Oral)   Resp 16   Ht 5\' 7"  (1.702 m)   Wt 105.6 kg   LMP 03/15/2023 (Exact Date)   SpO2 97%   BMI 36.46 kg/m   General Appearance: Fairly Groomed  Eye Contact:  Good  Speech:  Clear and Coherent  Volume:  Normal  Mood:  "okay"  Affect:  depressed  Thought Process:  Coherent  Orientation:  Full (Time, Place, and Person)  Thought Content: Logical   Suicidal Thoughts:  No  Homicidal Thoughts:  No  Memory:  Immediate;   Good  Judgement:  fair  Insight:  fair  Psychomotor Activity:  Normal  Concentration:  Concentration: Good  Recall:  Good  Fund of Knowledge: Good  Language: Good  Akathisia:  No  Handed:  not assessed  AIMS (if indicated): not done  Assets:  Communication Skills Desire for Improvement Financial Resources/Insurance Housing Leisure Time Physical Health  ADL's:  Intact  Cognition: WNL  Sleep:  Fair     Treatment Plan Summary: ASSESSMENT: Principal Problem:   Bipolar disorder, current episode depressed, severe (HCC) Active Problems:   Headache, migraine   ADHD (attention deficit hyperactivity disorder)   Suicide attempt (HCC)   Nicotine vapor product user       Patient is a 42 year old female with a history of bipolar disorder, ADHD, anxiety, and previous  suicide attempts resulting in hospitalization.  Patient remains severely depressed although today she denies passive SI.  She reports that her current medication regimen has worked well with mood stabilization, but situations ongoing since February of this year have contributed to worsening of her mood.  She is tolerating the SSRI well thus far without adverse effects or mood activation.     PLAN:  Safety and  Monitoring: voluntarily admission to inpatient psychiatric unit for safety, stabilization and treatment Daily contact with patient to assess and evaluate symptoms and progress in treatment Patient's case to be discussed in multi-disciplinary team meeting Observation Level : q15 minute checks Vital signs: q12 hours Precautions: suicide, elopement, and assault   2. Psychiatric Diagnoses and Treatment # Bipolar 1 disorder, current episode depressed, severe #PTSD Plan to avoid changes in medication to observe if Zoloft is making her sedated or it was simply the Imitrex. -Continue home Lamictal 400 mg nightly - Continue home Vraylar 1.5 mg nightly, plan to increase this medication in the coming days - Decrease home prazosin to 2 mg nightly - Continue Zoloft 12.5 mg nightly.  Starting a low dose to assess for mood activation prior to titrating dosage.  Plan to increase his medication in the coming days -Continue home Xanax 0.5 mg as needed for severe anxiety   #ADHD -As Adderall is not in stock on the unit, will hold throughout this admission.  Discussed with patient, and she is understanding and agreeable   PRN: Continue PRN's: Tylenol, Maalox, Atarax, Milk of Magnesia, Trazodone    -- The risks/benefits/side-effects/alternatives to this medication were discussed in detail with the patient and time was given for questions. The patient consents to medication trial.              -- Metabolic profile and EKG monitoring obtained while on an atypical antipsychotic  BMI: 36.46 TSH: 3.1 Lipid Panel: Cholesterol 224, LDL 141 HbgA1c: 5.8% QTc: 161             -- Encouraged patient to participate in unit milieu and in scheduled group therapies      3. Medical Issues Being Addressed: #Chronic migraines, improved -Continue home Imitrex injections as needed. Aimovig received 2 weeks ago   # Nicotine use disorder - Nicotine patch 14 mg daily   #Pre-diabetes -We will discuss lifestyle changes  throughout admission and advised PCP follow-up   #History of hypothyroidism - TSH WNL   4. Discharge Planning:              -- Social work and case management to assist with discharge planning and identification of hospital follow-up needs prior to discharge             -- Estimated LOS: 5-7 days             -- Discharge Concerns: Need to establish a safety plan; Medication compliance and effectiveness             -- Discharge Goals: Return home with outpatient referrals for mental health follow-up including medication management/psychotherapy     Carlyn Reichert, MD 04/18/2023, 1:56 PM

## 2023-04-18 NOTE — Progress Notes (Signed)
   04/18/23 0636  Vital Signs  Pulse Rate (!) 105  BP 108/66  BP Method Automatic  Patient Position (if appropriate) Sitting     04/18/23 0638  Vital Signs  Temp 98.5 F (36.9 C)  Temp Source Oral  Pulse Rate (!) 125  BP 109/76  BP Method Automatic  Patient Position (if appropriate) Standing    Patient with complaint of migraine headache 5/10.  Administered PRN Tylenol per [facility] per patient request.

## 2023-04-18 NOTE — [facility] Group Notes (Signed)
Spirituality group facilitated by Kathleen Argue, BCC.  Group Description: Group focused on topic of hope. Patients participated in facilitated discussion around topic, connecting with one another around experiences and definitions for hope. Group members engaged with visual explorer photos, reflecting on what hope looks like for them today. Group engaged in discussion around how their definitions of hope are present today in hospital.  Modalities: Psycho-social ed, Adlerian, Narrative, MI  Patient Progress: Mykelti attended the second half of group and was able to join in the conversation and activities immediately after arrival.  She was engaged and her comments demonstrated good insight.

## 2023-04-18 NOTE — Progress Notes (Signed)
   04/18/23 0544  15 Minute Checks  Location Bedroom  Visual Appearance Calm  Behavior Sleeping  Sleep (Behavioral Health Patients Only)  Calculate sleep? (Click Yes once per 24 hr at 0600 safety check) Yes  Documented sleep last 24 hours 9

## 2023-04-18 NOTE — [facility] Suicide Risk Assessment (Signed)
[facility] INPATIENT:  Family/Significant Other Suicide Prevention Education  Suicide Prevention Education:  Education Completed; [name],  (friend) has been identified by the patient as the family member/significant other with whom the patient will be residing, and identified as the person(s) who will aid the patient in the event of a mental health crisis (suicidal ideations/suicide attempt).  With written consent from the patient, the family member/significant other has been provided the following suicide prevention education, prior to the and/or following the discharge of the patient.  The suicide prevention education provided includes the following: Suicide risk factors Suicide prevention and interventions National Suicide Hotline telephone number [facility] assessment telephone number [facility] Emergency Assistance 911 [facility] and/or Residential Mobile Crisis Unit telephone number  Request made of family/significant other to: Remove weapons (e.g., guns, rifles, knives), all items previously/currently identified as safety concern.   Remove drugs/medications (over-the-counter, prescriptions, illicit drugs), all items previously/currently identified as a safety concern.  The family member/significant other verbalizes understanding of the suicide prevention education information provided.  The family member/significant other agrees to remove the items of safety concern listed above.  Jodelle Gross Meliss Fleek 04/18/2023, 11:08 AM

## 2023-04-19 MED ORDER — CARIPRAZINE HCL 3 MG PO CAPS
3.0000 mg | ORAL_CAPSULE | Freq: Every evening | ORAL | Status: DC
  Administered 2023-04-19 – 2023-04-20 (×2): 3 mg via ORAL
  Filled 2023-04-19 (×3): qty 1

## 2023-04-19 MED ORDER — ONDANSETRON 4 MG PO TBDP
4.0000 mg | ORAL_TABLET | Freq: Once | ORAL | Status: AC
  Administered 2023-04-19: 4 mg via ORAL
  Filled 2023-04-19 (×2): qty 1
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
## 2023-04-19 NOTE — Progress Notes (Addendum)
Pt reported passive SI this morning with no lethal plan expressed at this time. Patient verbally contracts for safety. Pt denied HI/AVH. Pt rated her depression a 8/10, anxiety a 8/10, and feelings of hopelessness a 8/10. Pt reports that she slept "good" last night. Patient's only physical complaint today is of a "slight headache". Patient reports that her goal for today is "to work on self-help and finding hope and reason to live". Pt has been pleasant, calm, and cooperative throughout the shift. RN provided support and encouragement to patient. Pt given scheduled medications as prescribed. Q15 min checks verified for safety. Patient verbally contracts for safety. Patient compliant with medications and treatment plan. Patient is interacting well on the unit. Pt is safe on the unit.   04/19/23 1000  Psych Admission Type (Psych Patients Only)  Admission Status Voluntary  Psychosocial Assessment  Patient Complaints Anxiety;Depression  Eye Contact Fair  Facial Expression Anxious  Affect Anxious;Sad  Speech Logical/coherent  Interaction Assertive  Motor Activity Slow  Appearance/Hygiene Unremarkable  Behavior Characteristics Cooperative;Appropriate to situation  Mood Pleasant  Thought Process  Coherency WDL  Content WDL  Delusions None reported or observed  Perception WDL  Hallucination None reported or observed  Judgment Impaired  Confusion None  Danger to Self  Current suicidal ideation? Passive  Description of Suicide Plan No plan  Self-Injurious Behavior Some self-injurious ideation observed or expressed.  No lethal plan expressed   Agreement Not to Harm Self Yes  Description of Agreement Pt verbally contracts for safety  Danger to Others  Danger to Others None reported or observed

## 2023-04-19 NOTE — Group Note (Signed)
Recreation Therapy Group Note   Group Topic:Animal Assisted Therapy   Group Date: 04/19/2023 Start Time: 0945 End Time: 1030 Facilitators: Demica Zook, Benito Mccreedy, LRT Location: [address] Dayroom  Animal-Assisted Activity (AAA) Program Checklist/Progress Note Patient Eligibility Criteria Checklist & Daily Group note for Rec Tx Intervention   AAA/T Program Assumption of Risk Form signed by Patient/ or Parent Legal Guardian YES  Patient is free of allergies or severe asthma  YES  Patient reports no fear of animals YES  Patient reports no history of cruelty to animals YES  Patient understands their participation is voluntary YES  Patient washes hands before animal contact YES  Patient washes hands after animal contact YES   Group Description: Patients provided opportunity to interact with trained and credentialed [employer] Therapy dog and the community volunteer/dog handler. Patients practiced appropriate animal interaction and were educated on dog safety outside of the hospital in common community settings. Patients were allowed to use dog toys and other items to practice commands, engage the dog in play, and/or complete routine aspects of animal care.   Education: Charity fundraiser, Health visitor, Communication & Social Skills   Affect/Mood: Congruent and Euthymic   Participation Level: Engaged   Participation Quality: Independent   Behavior: Appropriate, Cooperative, and Interactive    Speech/Thought Process: Coherent, Directed, and Oriented   Insight: Good   Judgement: Good   Modes of Intervention: Activity, Teaching laboratory technician, and Socialization   Patient Response to Interventions:  Interested  and Receptive   Education Outcome:  Acknowledges education   Clinical Observations/Individualized Feedback: Engineer, structural appropriately pet the visiting therapy dog, Bella during AAA programming. Pt expressed that they have a miniature Yorkie named Armed forces operational officer as a Administrator, arts at  home. Pt was pleasant and interactive with peers and Teaching laboratory technician, asking questions and sharing stories about personal experiences with animals.    Benito Mccreedy Bj Morlock, LRT, CTRS 04/19/2023 3:58 PM

## 2023-04-19 NOTE — [facility] Group Notes (Signed)
[facility] Group Notes:  (Nursing/MHT/Case Management/Adjunct)  Date:  04/19/2023  Time:  4:04 AM  Type of Therapy:   AA group  Participation Level:  Minimal  Participation Quality:  Appropriate  Affect:  Appropriate  Cognitive:  Appropriate  Insight:  Appropriate  Engagement in Group:  Engaged  Modes of Intervention:  Education  Summary of Progress/Problems: Attended AA group.  Shelby Gallegos 04/19/2023, 4:04 AM

## 2023-04-19 NOTE — Progress Notes (Signed)
   04/19/23 0000  Psych Admission Type (Psych Patients Only)  Admission Status Voluntary  Psychosocial Assessment  Patient Complaints Anxiety;Agitation;Hopelessness;Irritability;Tension;Worrying  Eye Contact Fair  Facial Expression Anxious  Affect Anxious;Preoccupied  Speech Soft  Interaction Isolative  Motor Activity Slow  Appearance/Hygiene Unremarkable  Behavior Characteristics Cooperative;Appropriate to situation  Mood Pleasant  Thought Process  Coherency WDL  Content WDL  Delusions None reported or observed  Perception WDL  Hallucination None reported or observed  Judgment Impaired  Confusion None  Danger to Self  Current suicidal ideation? Passive  Self-Injurious Behavior No self-injurious ideation or behavior indicators observed or expressed   Agreement Not to Harm Self Yes  Description of Agreement Verbal  Danger to Others  Danger to Others None reported or observed   Patient became upset after a phone call  Agitation protocol was met as she was not able to calm herself. Medications were effective and she slept through night. Endorses SI but contracts for safety and verbally agreed not harm herself.  Denies HI/AVH

## 2023-04-19 NOTE — Group Note (Signed)
LCSW Group Therapy Note  Group Date: 04/19/2023 Start Time: 1100 End Time: 1200   Type of Therapy and Topic:  Group Therapy - How To Cope with Nervousness about Discharge   Participation Level:  Minimal   Description of Group This process group involved identification of patients' feelings about discharge. Some of them are scheduled to be discharged soon, while others are new admissions, but each of them was asked to share thoughts and feelings surrounding discharge from the hospital. One common theme was that they are excited at the prospect of going home, while another was that many of them are apprehensive about sharing why they were hospitalized. Patients were given the opportunity to discuss these feelings with their peers in preparation for discharge.  Therapeutic Goals  Patient will identify their overall feelings about pending discharge. Patient will think about how they might proactively address issues that they believe will once again arise once they get home (i.e. with parents). Patients will participate in discussion about having hope for change.   Summary of Patient Progress:  She was very active throughout the session.   Therapeutic Modalities Cognitive Behavioral Therapy   Shelby Gallegos, Shelby Gallegos 04/19/2023  12:03 PM

## 2023-04-19 NOTE — Progress Notes (Signed)
D- Patient alert and oriented. Denies HI, AVH. Endorses passive SI with no plan. Reports pain 7/10 described as a migraine. Patient states that she is having a hard time focusing and concentration while on the unit and in groups. States that she takes adderall 20mg  TID at home.  A- Scheduled medications administered to patient, per MAR. PRN IM imitrex administered for migraine, per MAR. Patient reported no relief and nausea during recheck. One time dose of zofran ordered and administered for nausea. Patient asleep at recheck. Support and encouragement provided.  Routine safety checks conducted every 15 minutes.  Patient informed to notify staff with problems or concerns.  R- No adverse drug reactions noted. Patient contracts for safety at this time. Patient compliant with medications and treatment plan. Patient receptive, calm, and cooperative. Patient interacts well with others on the unit.  Patient remains safe at this time.

## 2023-04-19 NOTE — Plan of Care (Signed)
  Problem: Education: Goal: Emotional status will improve Outcome: Progressing Goal: Mental status will improve Outcome: Progressing   Problem: Health Behavior/Discharge Planning: Goal: Compliance with treatment plan for underlying cause of condition will improve Outcome: Progressing   
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
## 2023-04-19 NOTE — Group Note (Signed)
Date:  04/19/2023 Time:  4:44 PM  Group Topic/Focus:  Goals Group:   The focus of this group is to help patients establish daily goals to achieve during treatment and discuss how the patient can incorporate goal setting into their daily lives to aide in recovery. Orientation:   The focus of this group is to educate the patient on the purpose and policies of crisis stabilization and provide a format to answer questions about their admission.  The group details unit policies and expectations of patients while admitted.    Participation Level:  Active  Participation Quality:  Appropriate  Affect:  Appropriate  Cognitive:  Appropriate  Insight: Appropriate  Engagement in Group:  Engaged  Modes of Intervention:  Discussion and Orientation  Additional Comments:   Pt attended and actively participated in the Orientation/Goals group.  Edmund Hilda Malley Hauter 04/19/2023, 4:44 PM

## 2023-04-20 NOTE — Progress Notes (Signed)
Adult Psychoeducational Group Note  Date:  04/20/2023 Time:  9:34 PM  Group Topic/Focus:  Wrap-Up Group:   The focus of this group is to help patients review their daily goal of treatment and discuss progress on daily workbooks.  Participation Level:  Active  Participation Quality:  Appropriate  Affect:  Appropriate  Cognitive:  Appropriate  Insight: Appropriate  Engagement in Group:  Engaged  Modes of Intervention:  Discussion  Additional Comments:  Pt attended and participated in NA meeting.  Byanka Landrus Katrinka Blazing 04/20/2023, 9:34 PM

## 2023-04-20 NOTE — Progress Notes (Signed)
   04/20/23 2956  15 Minute Checks  Location Bedroom  Visual Appearance Calm  Behavior Composed  Sleep (Behavioral Health Patients Only)  Calculate sleep? (Click Yes once per 24 hr at 0600 safety check) Yes  Documented sleep last 24 hours 6.25

## 2023-04-20 NOTE — [facility] Group Notes (Signed)
[facility] Group Notes:  (Nursing/MHT/Case Management/Adjunct)  Date:  04/20/2023  Time:  2:34 AM  Type of Therapy:   Wrap-up group  Participation Level:  Active  Participation Quality:  Appropriate  Affect:  Appropriate  Cognitive:  Appropriate  Insight:  Appropriate  Engagement in Group:  Engaged  Modes of Intervention:  Education  Summary of Progress/Problems: Pt goal to be more social, communicate more. Day 6/10.  Noah Delaine 04/20/2023, 2:34 AM

## 2023-04-20 NOTE — [facility] Group Notes (Signed)
Spiritual care group on grief and loss facilitated by chaplain Dyanne Carrel, [facility]  Group Goal:  Support / Education around grief and loss  Members engage in facilitated group support and psycho-social education.  Group Description:  Following introductions and group rules, group members engaged in facilitated group dialog and support around topic of loss, with particular support around experiences of loss in their lives. Group Identified types of loss (relationships / self / things) and identified patterns, circumstances, and changes that precipitate losses. Reflected on thoughts / feelings around loss, normalized grief responses, and recognized variety in grief experience. Group noted Worden's four tasks of grief in discussion.  Group drew on Adlerian / Rogerian, narrative, MI,  Patient Progress: Shelby Gallegos attended group and participated in group conversation.  While verbal comments were limited, she remained engaged for the duration of the group.

## 2023-04-20 NOTE — Progress Notes (Signed)
   04/20/23 1000  Psych Admission Type (Psych Patients Only)  Admission Status Voluntary  Psychosocial Assessment  Patient Complaints Anxiety;Depression  Eye Contact Fair  Facial Expression Anxious  Affect Anxious  Speech Logical/coherent  Interaction Isolative;Assertive  Motor Activity Slow  Appearance/Hygiene Unremarkable  Behavior Characteristics Cooperative  Mood Anxious;Pleasant  Thought Process  Coherency WDL  Content WDL  Delusions None reported or observed  Perception WDL  Hallucination None reported or observed  Judgment Impaired  Confusion None  Danger to Self  Current suicidal ideation? Denies  Agreement Not to Harm Self Yes  Description of Agreement verbal  Danger to Others  Danger to Others None reported or observed

## 2023-04-20 NOTE — Progress Notes (Signed)
Patient woke up stating that nausea has been relieved, but migraine pain is still rated as a 9/10. Per instructions from [place], NP, PRN xanax administered. No adverse drug reactions noted. Patient remains safe at this time.

## 2023-04-20 NOTE — Group Note (Signed)
Date:  04/20/2023 Time:  10:47 AM  Group Topic/Focus:  Goals Group:   The focus of this group is to help patients establish daily goals to achieve during treatment and discuss how the patient can incorporate goal setting into their daily lives to aide in recovery. Orientation:   The focus of this group is to educate the patient on the purpose and policies of crisis stabilization and provide a format to answer questions about their admission.  The group details unit policies and expectations of patients while admitted.    Participation Level:  Did Not Attend  Participation Quality:   n/a  Affect:   n/a  Cognitive:   n/a  Insight: None  Engagement in Group:   n/a  Modes of Intervention:   n/a  Additional Comments:   Pt did not attend.  Shelby Gallegos 04/20/2023, 10:47 AM
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
## 2023-04-20 NOTE — Group Note (Signed)
Recreation Therapy Group Note   Group Topic:Health and Wellness  Group Date: 04/20/2023 Start Time: 1400 End Time: 1450 Facilitators: Zealand Boyett, Benito Mccreedy, LRT Location: [address]  Activity Description/Intervention: Therapeutic Drumming. Patients with peers and staff were given the opportunity to engage in a leader facilitated [facility] Group Empowerment Drumming Circle with staff from the [employer], in partnership with [employer].    Affect/Mood: N/A   Participation Level: Did not attend    Clinical Observations/Individualized Feedback: Pt declined RT session offered.    Benito Mccreedy May Ozment, LRT, CTRS 04/20/2023 4:29 PM

## 2023-04-21 MED ORDER — PRAZOSIN HCL 2 MG PO CAPS: 2.0000 mg | ORAL_CAPSULE | Freq: Every day | ORAL | 0 refills | Status: DC

## 2023-04-21 MED ORDER — CARIPRAZINE HCL 3 MG PO CAPS: 3.0000 mg | ORAL_CAPSULE | Freq: Every evening | ORAL | 0 refills | Status: DC

## 2023-04-21 MED ORDER — NICOTINE 14 MG/24HR TD PT24: 14.0000 mg | MEDICATED_PATCH | Freq: Every day | TRANSDERMAL | 0 refills | Status: DC

## 2023-04-21 NOTE — Plan of Care (Signed)
  Problem: Safety: Goal: Periods of time without injury will increase Outcome: Progressing   

## 2023-04-21 NOTE — Progress Notes (Signed)
Patient verbalizes readiness for discharge. All patient belongings returned to patient. Discharge instructions read and discussed with patient (appointments, medications, resources). Patient expressed gratitude for care provided. Patient discharged to lobby at 1152 where safe transport was waiting.

## 2023-04-21 NOTE — Progress Notes (Addendum)
  [facility] Adult Case Management Discharge Plan :  Will you be returning to the same living situation after discharge:  Yes,  Pt will return to her apartment where she resides with her room mate At discharge, do you have transportation home?: Yes,   Shelby Gallegos, [phone]  Do you have the ability to pay for your medications: Yes,  Insured BCBS  Release of information consent forms completed and in the chart;  Patient's signature needed at discharge.  Patient to Follow up at:  Follow-up Information     [facility]. Go on 04/26/2023.   Why: appointment is at 10am with [name], therapist Contact information: [address] [place] [address] [phone]         BEHAVIORAL HEALTH PARTIAL HOSPITALIZATION PROGRAM. Call on 04/26/2023.   Specialty: Behavioral Health Why: You are scheduled for an assessment for the PHP on Tuesday, 5/28 at 10:00 am. This appointment will last approximately one hour and will be virtual via [employer] Please download the [employer] Teams app prior to the appointment. If you need to cancel or reschedule, please call [phone] and leave a voicemail with your name, date of birth, and phone number." Contact information: [address] [place] [address] [phone]                Next level of care provider has access to [facility] Link:yes  Safety Planning and Suicide Prevention discussed: Yes,   Shelby Gallegos, [phone]      Has patient been referred to the Quitline?: Patient does not use tobacco/nicotine products  Patient has been referred for addiction treatment: Yes, the patient will follow up with an outpatient provider for substance use disorder. Psychiatrist/APP: appointment made and Therapist: appointment made Patient to continue working towards treatment goals after discharge. Patient no longer meets criteria for inpatient criteria per attending physician. Continue taking  medications as prescribed, nursing to provide instructions at discharge. Follow up with all scheduled appointments.   Shelby Junkins S Shelby Percival, LCSW 04/21/2023, 11:23 AM

## 2023-04-21 NOTE — BHH Suicide Risk Assessment (Signed)
[facility] Discharge Suicide Risk Assessment   Principal Problem: Bipolar disorder, current episode depressed, severe (HCC) Discharge Diagnoses: Principal Problem:   Bipolar disorder, current episode depressed, severe (HCC) Active Problems:   Headache, migraine   ADHD (attention deficit hyperactivity disorder)   Suicide attempt (HCC)   Nicotine vapor product user    Total Time spent with patient: 15 min   During the patient's hospitalization, patient had extensive initial psychiatric evaluation, and follow-up psychiatric evaluations every day.  Upon evaluation, psychiatric diagnoses were given as follows:  # Bipolar 1 disorder, current episode depressed, severe #PTSD  Patient's psychiatric medications were adjusted on admission:  -Continue home Lamictal 400 mg nightly - Continue home Vraylar 3 mg nightly - Decrease home prazosin to 2 mg nightly - START Zoloft 12.5 mg nightly.  Will start low and assess for mood activation prior to titrating dosage. -Continue home Xanax 0.5 mg as needed for severe anxiety  During the hospitalization, other adjustments were made to the patient's psychiatric medication regimen:  Zoloft was discontinued due to perceived sedation by the patient   Gradually, patient started adjusting to milieu.   Patient's care was discussed during the interdisciplinary team meeting every day during the hospitalization.  The patient denied having side effects to prescribed psychiatric medication.  The patient reports their target psychiatric symptoms of depression responded well to the psychiatric medications, and the patient reports overall benefit other psychiatric hospitalization. Supportive psychotherapy was provided to the patient. The patient also participated in regular group therapy while admitted.   Labs were reviewed with the patient, and abnormal results were discussed with the patient.  The patient denied having suicidal thoughts more than 48 hours prior  to discharge.  Patient denies having homicidal thoughts.  Patient denies having auditory hallucinations.  Patient denies any visual hallucinations.  Patient denies having paranoid thoughts.  The patient is able to verbalize their individual safety plan to this provider.  It is recommended to the patient to continue psychiatric medications as prescribed, after discharge from the hospital.    It is recommended to the patient to follow up with your outpatient psychiatric provider and PCP.  Discussed with the patient, the impact of alcohol, drugs, tobacco have been there overall psychiatric and medical wellbeing, and total abstinence from substance use was recommended the patient.    Psychiatric Specialty Exam: Physical Exam Constitutional:      Appearance: the patient is not toxic-appearing.  Pulmonary:     Effort: Pulmonary effort is normal.  Neurological:     General: No focal deficit present.     Mental Status: the patient is alert and oriented to person, place, and time.   Review of Systems  Respiratory:  Negative for shortness of breath.   Cardiovascular:  Negative for chest pain.  Gastrointestinal:  Negative for abdominal pain, constipation, diarrhea, nausea and vomiting.  Neurological:  Negative for headaches.      BP 91/62 (BP Location: Left Arm)   Pulse (!) 106   Temp 98.4 F (36.9 C) (Oral)   Resp 14   Ht 5\' 7"  (1.702 m)   Wt 105.6 kg   LMP 03/15/2023 (Exact Date)   SpO2 97%   BMI 36.46 kg/m   General Appearance: Fairly Groomed  Eye Contact:  Good  Speech:  Clear and Coherent  Volume:  Normal  Mood:  Euthymic  Affect:  Congruent  Thought Process:  Coherent  Orientation:  Full (Time, Place, and Person)  Thought Content: Logical   Suicidal Thoughts:  No  Homicidal Thoughts:  No  Memory:  Immediate;   Good  Judgement:  fair  Insight:  fair  Psychomotor Activity:  Normal  Concentration:  Concentration: Good  Recall:  Good  Fund of Knowledge: Good   Language: Good  Akathisia:  No  Handed:  not assessed  AIMS (if indicated): not done  Assets:  Communication Skills Desire for Improvement Financial Resources/Insurance Housing Leisure Time Physical Health  ADL's:  Intact  Cognition: WNL  Sleep:  Fair     Mental Status Per Nursing Assessment::   On Admission:   (denies current SI thoughts)  Demographic Factors:  NA  Loss Factors: NA  Historical Factors: NA  Risk Reduction Factors:   Positive social support Coping skills Good therapeutic relationship  Continued Clinical Symptoms:  Depression   Cognitive Features That Contribute To Risk:  None  Suicide Risk:  Mild: Suicidal ideation of limited frequency, intensity, duration, and specificity.  There are no identifiable plans, no associated intent, mild dysphoria and related symptoms, good self-control (both objective and subjective assessment), few other risk factors, and identifiable protective factors, including available and accessible social support.   Follow-up Information     [facility]. Go on 04/26/2023.   Why: appointment is at 10am with [name], therapist Contact information: [address] [place] [address] [phone]         BEHAVIORAL HEALTH PARTIAL HOSPITALIZATION PROGRAM. Call on 04/26/2023.   Specialty: Behavioral Health Why: You are scheduled for an assessment for the PHP on Tuesday, 5/28 at 10:00 am. This appointment will last approximately one hour and will be virtual via [employer] Please download the [employer] Teams app prior to the appointment. If you need to cancel or reschedule, please call [phone] and leave a voicemail with your name, date of birth, and phone number." Contact information: [address] [place] [address] [phone]        [facility] .   Specialty: Behavioral Health Contact information: [address] [place]  [place] [address] [phone]                Plan Of Care/Follow-up recommendations:  Activity as tolerated. Diet as recommended by PCP. Keep all scheduled follow-up appointments as recommended.  Patient is instructed to take all prescribed medications as recommended. Report any side effects or adverse reactions to your outpatient psychiatrist. Patient is instructed to abstain from alcohol and illegal drugs while on prescription medications. In the event of worsening symptoms, patient is instructed to call the crisis hotline, 911, or go to the nearest emergency department for evaluation and treatment.  Prescriptions given at discharge. Patient agreeable to plan. Given opportunity to ask questions. Appears to feel comfortable with discharge.  Patient is also instructed prior to discharge to: Take all medications as prescribed by mental healthcare provider. Report any adverse effects and or reactions from the medicines to outpatient provider promptly. Patient has been instructed & cautioned: To not engage in alcohol and or illegal drug use while on prescription medicines. In the event of worsening symptoms,  patient is instructed to call the crisis hotline, 911 and or go to the nearest ED for appropriate evaluation and treatment of symptoms. To follow-up with primary care provider for other medical issues, concerns and or health care needs  The patient was evaluated each day by a clinical provider to ascertain response to treatment. Improvement was noted by the patient's report of decreasing symptoms, improved sleep  and appetite, affect, medication tolerance, behavior, and participation in unit programming.  Patient was asked each day to complete a self inventory noting mood, mental status, pain, new symptoms, anxiety and concerns.  Patient responded well to medication and being in a therapeutic and supportive environment. Positive and appropriate behavior was noted and the patient was motivated  for recovery. The patient worked closely with the treatment team and case manager to develop a discharge plan with appropriate goals. Coping skills, problem solving as well as relaxation therapies were also part of the unit programming.  By the day of discharge patient was in much improved condition than upon admission.  Symptoms were reported as significantly decreased or resolved completely. The patient was motivated to continue taking medication with a goal of continued improvement in mental health.     Carlyn Reichert, MD PGY-2

## 2023-04-21 NOTE — Discharge Summary (Signed)
Physician Discharge Summary Note  Patient:  Shelby Gallegos is an 42 y.o., female MRN:  [id number] DOB:  [birth year] Patient phone:  [phone] (home)  Patient address:   [address] [place] [address],  Total Time spent with patient: 15 min  Date of Admission:  04/15/2023 Date of Discharge: 04/21/2023  Reason for Admission:   Shelby Gallegos is a 42 year old female with a past psychiatric history of bipolar 1 disorder, ADHD, and prior suicide attempts via OD who presented voluntarily from [facility] at the referral of her therapist for suicide attempt via overdose on trazodone and alcohol.   Principal Problem: Bipolar disorder, current episode depressed, severe (HCC) Discharge Diagnoses: Principal Problem:   Bipolar disorder, current episode depressed, severe (HCC) Active Problems:   Headache, migraine   ADHD (attention deficit hyperactivity disorder)   Suicide attempt (HCC)   Nicotine vapor product user    Past Psychiatric History: See H and P  Past Medical History:  Past Medical History:  Diagnosis Date   ADHD (attention deficit hyperactivity disorder)    Bipolar 1 disorder (HCC)    Migraine    Suicide attempt by drug overdose (HCC) 02/15/2019    Past Surgical History:  Procedure Laterality Date   NO PAST SURGERIES     TENDON REPAIR Bilateral 02/20/2019   Procedure: TENDON REPAIR BILATERAL FOREARMS;  Surgeon: [name], MD;  Location: [facility];  Service: Orthopedics;  Laterality: Bilateral;   WOUND EXPLORATION Bilateral 02/20/2019   Procedure: WOUND EXPLORATION;  Surgeon: [name], MD;  Location: [facility];  Service: Orthopedics;  Laterality: Bilateral;   Family History:  Family History  Problem Relation Age of Onset   Atrial fibrillation Mother    Bipolar disorder Paternal Uncle    Alcohol abuse Paternal Uncle    Depression Paternal Uncle    OCD Paternal Uncle    Family Psychiatric  History: See H and P Social History:  Social  History   Substance and Sexual Activity  Alcohol Use Not Currently   Alcohol/week: 2.0 standard drinks of alcohol   Types: 2 Cans of beer per week   Comment: pt states she has been sober since 2020 but relapsed 04/13/23 and had 2 wine coolers     Social History   Substance and Sexual Activity  Drug Use No    Social History   Socioeconomic History   Marital status: Single    Spouse name: Not on file   Number of children: 0   Years of education: Not on file   Highest education level: Bachelor's degree (e.g., BA, AB, BS)  Occupational History   Not on file  Tobacco Use   Smoking status: Never   Smokeless tobacco: Never  Vaping Use   Vaping Use: Every day   Start date: 11/29/2021   Substances: Nicotine, Flavoring  Substance and Sexual Activity   Alcohol use: Not Currently    Alcohol/week: 2.0 standard drinks of alcohol    Types: 2 Cans of beer per week    Comment: pt states she has been sober since 2020 but relapsed 04/13/23 and had 2 wine coolers   Drug use: No   Sexual activity: Not Currently    Birth control/protection: None  Other Topics Concern   Not on file  Social History Narrative   Not on file   Social Determinants of Health   Financial Resource Strain: Low Risk  (03/30/2023)   Overall Financial Resource Strain (CARDIA)    Difficulty of Paying  Living Expenses: Not very hard  Food Insecurity: No Food Insecurity (04/15/2023)   Hunger Vital Sign    Worried About Running Out of Food in the Last Year: Never true    Ran Out of Food in the Last Year: Never true  Transportation Needs: No Transportation Needs (04/15/2023)   PRAPARE - Administrator, Civil Service (Medical): No    Lack of Transportation (Non-Medical): No  Physical Activity: Insufficiently Active (03/30/2023)   Exercise Vital Sign    Days of Exercise per Week: 2 days    Minutes of Exercise per Session: 20 min  Stress: Stress Concern Present (03/30/2023)   [employer] of Occupational Health  - Occupational Stress Questionnaire    Feeling of Stress : Rather much  Social Connections: Moderately Integrated (03/30/2023)   Social Connection and Isolation Panel [NHANES]    Frequency of Communication with Friends and Family: More than three times a week    Frequency of Social Gatherings with Friends and Family: More than three times a week    Attends Religious Services: More than 4 times per year    Active Member of [employer] or Organizations: Yes    Attends Engineer, structural: More than 4 times per year    Marital Status: Never married    Hospital Course:   During the patient's hospitalization, patient had extensive initial psychiatric evaluation, and follow-up psychiatric evaluations every day.   Upon evaluation, psychiatric diagnoses were given as follows:  # Bipolar 1 disorder, current episode depressed, severe #PTSD   Patient's psychiatric medications were adjusted on admission:  -Continue home Lamictal 400 mg nightly - Continue home Vraylar 3 mg nightly - Decrease home prazosin to 2 mg nightly - START Zoloft 12.5 mg nightly.  Will start low and assess for mood activation prior to titrating dosage. -Continue home Xanax 0.5 mg as needed for severe anxiety   During the hospitalization, other adjustments were made to the patient's psychiatric medication regimen:  Zoloft was discontinued due to perceived sedation by the patient     Gradually, patient started adjusting to milieu.   Patient's care was discussed during the interdisciplinary team meeting every day during the hospitalization.   The patient denied having side effects to prescribed psychiatric medication.   The patient reports their target psychiatric symptoms of depression responded well to the psychiatric medications, and the patient reports overall benefit other psychiatric hospitalization. Supportive psychotherapy was provided to the patient. The patient also participated in regular group therapy while  admitted.    Labs were reviewed with the patient, and abnormal results were discussed with the patient.   The patient denied having suicidal thoughts more than 48 hours prior to discharge.  Patient denies having homicidal thoughts.  Patient denies having auditory hallucinations.  Patient denies any visual hallucinations.  Patient denies having paranoid thoughts.   The patient is able to verbalize their individual safety plan to this provider.   It is recommended to the patient to continue psychiatric medications as prescribed, after discharge from the hospital.     It is recommended to the patient to follow up with your outpatient psychiatric provider and PCP.   Discussed with the patient, the impact of alcohol, drugs, tobacco have been there overall psychiatric and medical wellbeing, and total abstinence from substance use was recommended the patient.  It is notable that the patient did receive an agitation protocol last night, after a stressful phone call with her family.  The patient is able to  have a discussion regarding emotional regulation and coping skills going forward.  She agrees to reach out to get help should she develop suicidal thoughts.  Physical Findings: AIMS 0  Psychiatric Specialty Exam: Physical Exam Constitutional:      Appearance: the patient is not toxic-appearing.  Pulmonary:     Effort: Pulmonary effort is normal.  Neurological:     General: No focal deficit present.     Mental Status: the patient is alert and oriented to person, place, and time.   Review of Systems  Respiratory:  Negative for shortness of breath.   Cardiovascular:  Negative for chest pain.  Gastrointestinal:  Negative for abdominal pain, constipation, diarrhea, nausea and vomiting.  Neurological:  Negative for headaches.      BP 91/62 (BP Location: Left Arm)   Pulse (!) 106   Temp 98.4 F (36.9 C) (Oral)   Resp 14   Ht 5\' 7"  (1.702 m)   Wt 105.6 kg   LMP 03/15/2023 (Exact Date)    SpO2 97%   BMI 36.46 kg/m   General Appearance: Fairly Groomed  Eye Contact:  Good  Speech:  Clear and Coherent  Volume:  Normal  Mood:  Euthymic  Affect:  Congruent  Thought Process:  Coherent  Orientation:  Full (Time, Place, and Person)  Thought Content: Logical   Suicidal Thoughts:  No  Homicidal Thoughts:  No  Memory:  Immediate;   Good  Judgement:  fair  Insight:  fair  Psychomotor Activity:  Normal  Concentration:  Concentration: Good  Recall:  Good  Fund of Knowledge: Good  Language: Good  Akathisia:  No  Handed:  not assessed  AIMS (if indicated): not done  Assets:  Communication Skills Desire for Improvement Financial Resources/Insurance Housing Leisure Time Physical Health  ADL's:  Intact  Cognition: WNL  Sleep:  Fair      Social History   Tobacco Use  Smoking Status Never  Smokeless Tobacco Never   Tobacco Cessation:  A prescription for an FDA-approved tobacco cessation medication provided at discharge   Blood Alcohol level:  Lab Results  Component Value Date   [facility] <10 04/14/2023   ETH <10 02/15/2019    Metabolic Disorder Labs:  Lab Results  Component Value Date   HGBA1C 5.8 (H) 04/14/2023   MPG 119.76 04/14/2023   MPG 114.02 02/19/2019   No results found for: "PROLACTIN" Lab Results  Component Value Date   CHOL 224 (H) 04/14/2023   TRIG 142 04/14/2023   HDL 55 04/14/2023   CHOLHDL 4.1 04/14/2023   VLDL 28 04/14/2023   LDLCALC 141 (H) 04/14/2023   LDLCALC 130 (H) 03/31/2023    See Psychiatric Specialty Exam and Suicide Risk Assessment completed by Attending Physician prior to discharge.  Discharge destination: self-care  Is patient on multiple antipsychotic therapies at discharge:  no Has Patient had three or more failed trials of antipsychotic monotherapy by history:  no  Recommended Plan for Multiple Antipsychotic Therapies: NA  Discharge Instructions     Diet - low sodium heart healthy   Complete by: As directed     Increase activity slowly   Complete by: As directed       Allergies as of 04/21/2023       Reactions   Onion    Triggers migraines        Medication List     STOP taking these medications    amphetamine-dextroamphetamine 20 MG tablet Commonly known as: ADDERALL   aspirin-acetaminophen-caffeine 250-250-65 MG  tablet Commonly known as: EXCEDRIN MIGRAINE   ibuprofen 200 MG tablet Commonly known as: ADVIL       TAKE these medications      Indication  Aimovig 140 MG/ML Soaj Generic drug: Erenumab-aooe Inject 140 mg into the skin every 30 (thirty) days.  Indication: Migraine Headache   ALPRAZolam 0.5 MG tablet Commonly known as: XANAX Take 0.5 mg by mouth daily as needed for anxiety.  Indication: Feeling Anxious   cariprazine 3 MG capsule Commonly known as: Vraylar Take 1 capsule (3 mg total) by mouth every evening. What changed: when to take this  Indication: Depressive Phase of Manic-Depression   cyanocobalamin 1000 MCG/ML injection Commonly known as: VITAMIN B12 Inject 1 mL (1,000 mcg total) into the muscle once for 1 dose. Inject 1 mL into the muscle once a week for 3 weeks and then once monthly What changed: See the new instructions.  Indication: Inadequate Vitamin B12   lamoTRIgine 200 MG tablet Commonly known as: LAMICTAL Take 400 mg by mouth at bedtime.  Indication: Manic-Depression   nicotine 14 mg/24hr patch Commonly known as: NICODERM CQ - dosed in mg/24 hours Place 1 patch (14 mg total) onto the skin daily. Start taking on: Apr 22, 2023  Indication: Nicotine Addiction   Nurtec 75 MG Tbdp Generic drug: Rimegepant Sulfate Take 75 mg by mouth as needed (For migraines).  Indication: Migraine Headache   prazosin 2 MG capsule Commonly known as: MINIPRESS Take 1 capsule (2 mg total) by mouth at bedtime.  Indication: Frightening Dreams   SUMAtriptan 6 MG/0.5ML Soaj Inject 6 mg into the skin as needed. What changed:  when to take  this reasons to take this  Indication: Migraine Headache   Ubrelvy 100 MG Tabs Generic drug: Ubrogepant Take 1 tablet (100 mg total) by mouth as needed. What changed: reasons to take this  Indication: Migraine Headache        Follow-up Information     [facility] Go on 04/26/2023.   Why: appointment is at 10am with [name], therapist Contact information: [address] [place] [address] [phone]         BEHAVIORAL HEALTH PARTIAL HOSPITALIZATION PROGRAM. Call on 04/26/2023.   Specialty: Behavioral Health Why: You are scheduled for an assessment for the PHP on Tuesday, 5/28 at 10:00 am. This appointment will last approximately one hour and will be virtual via [employer] Please download the [employer] Teams app prior to the appointment. If you need to cancel or reschedule, please call [phone] and leave a voicemail with your name, date of birth, and phone number." Contact information: [address] [place] [address] [phone]        [facility] .   Specialty: Behavioral Health Contact information: [address] [place] [address] [phone]                Follow-up recommendations:  Activity as tolerated. Diet as recommended by PCP. Keep all scheduled follow-up appointments as recommended.  Patient is instructed to take all prescribed medications as recommended. Report any side effects or adverse reactions to your outpatient psychiatrist. Patient is instructed to abstain from alcohol and illegal drugs while on prescription medications. In the event of worsening symptoms, patient is instructed to call the crisis hotline, 911, or go to the nearest emergency department for evaluation and treatment.  Prescriptions given at discharge. Patient agreeable to plan. Given opportunity to ask questions. Appears to feel comfortable  with discharge.  Patient is  also instructed prior to discharge to: Take all medications as prescribed by mental healthcare provider. Report any adverse effects and or reactions from the medicines to outpatient provider promptly. Patient has been instructed & cautioned: To not engage in alcohol and or illegal drug use while on prescription medicines. In the event of worsening symptoms,  patient is instructed to call the crisis hotline, 911 and or go to the nearest ED for appropriate evaluation and treatment of symptoms. To follow-up with primary care provider for other medical issues, concerns and or health care needs  The patient was evaluated each day by a clinical provider to ascertain response to treatment. Improvement was noted by the patient's report of decreasing symptoms, improved sleep and appetite, affect, medication tolerance, behavior, and participation in unit programming.  Patient was asked each day to complete a self inventory noting mood, mental status, pain, new symptoms, anxiety and concerns.  Patient responded well to medication and being in a therapeutic and supportive environment. Positive and appropriate behavior was noted and the patient was motivated for recovery. The patient worked closely with the treatment team and case manager to develop a discharge plan with appropriate goals. Coping skills, problem solving as well as relaxation therapies were also part of the unit programming.  By the day of discharge patient was in much improved condition than upon admission.  Symptoms were reported as significantly decreased or resolved completely. The patient was motivated to continue taking medication with a goal of continued improvement in mental health.    Comments:  As above  Signed: Carlyn Reichert, MD PGY-2

## 2023-04-21 NOTE — Progress Notes (Signed)
   04/21/23 0000  Psych Admission Type (Psych Patients Only)  Admission Status Voluntary  Psychosocial Assessment  Patient Complaints Anxiety;Agitation;Crying spells;Depression;Hopelessness;Irritability;Nervousness;Self-harm thoughts;Tension;Worrying  Eye Contact Fair  Facial Expression Anxious;Sad;Pained;Worried  Affect Anxious;Depressed;Irritable  Multimedia programmer;Slow  Appearance/Hygiene Unremarkable  Behavior Characteristics Cooperative  Mood Anxious;Depressed;Angry;Despair;Irritable;Sad  Thought Process  Coherency WDL  Content WDL  Delusions None reported or observed  Perception WDL  Hallucination None reported or observed  Judgment Impaired  Confusion None  Danger to Self  Current suicidal ideation? Denies  Agreement Not to Harm Self Yes  Description of Agreement verbal  Danger to Others  Danger to Others None reported or observed   Alert/oriented. Patient observed very agitated during shift after a phone call. Patient stated "I feel like I want to hit the wall". Talked with patient about using  coping strategies. Requested PRN medication for agitation. Medication given with good effect.  Denies SI/HI/A/V hallucinations.  Verbally contracted for safety. Will continue to monitor.

## 2023-04-26 ENCOUNTER — Other Ambulatory Visit (HOSPITAL_COMMUNITY): Payer: BC Managed Care – PPO | Attending: Psychiatry | Admitting: Licensed Clinical Social Worker

## 2023-04-26 ENCOUNTER — Encounter (HOSPITAL_COMMUNITY): Payer: Self-pay

## 2023-04-26 ENCOUNTER — Telehealth (HOSPITAL_COMMUNITY): Payer: Self-pay | Admitting: Professional

## 2023-04-26 ENCOUNTER — Telehealth (HOSPITAL_COMMUNITY): Payer: Self-pay | Admitting: Licensed Clinical Social Worker

## 2023-04-26 DIAGNOSIS — Z79899 Other long term (current) drug therapy: Secondary | ICD-10-CM | POA: Insufficient documentation

## 2023-04-26 DIAGNOSIS — F17290 Nicotine dependence, other tobacco product, uncomplicated: Secondary | ICD-10-CM | POA: Insufficient documentation

## 2023-04-26 DIAGNOSIS — F4312 Post-traumatic stress disorder, chronic: Secondary | ICD-10-CM | POA: Diagnosis not present

## 2023-04-26 DIAGNOSIS — F4310 Post-traumatic stress disorder, unspecified: Secondary | ICD-10-CM | POA: Insufficient documentation

## 2023-04-26 DIAGNOSIS — F314 Bipolar disorder, current episode depressed, severe, without psychotic features: Secondary | ICD-10-CM | POA: Insufficient documentation

## 2023-04-26 DIAGNOSIS — F909 Attention-deficit hyperactivity disorder, unspecified type: Secondary | ICD-10-CM | POA: Insufficient documentation

## 2023-04-26 DIAGNOSIS — Z9151 Personal history of suicidal behavior: Secondary | ICD-10-CM | POA: Insufficient documentation

## 2023-04-26 DIAGNOSIS — F419 Anxiety disorder, unspecified: Secondary | ICD-10-CM | POA: Insufficient documentation

## 2023-04-26 DIAGNOSIS — Z658 Other specified problems related to psychosocial circumstances: Secondary | ICD-10-CM | POA: Insufficient documentation

## 2023-04-26 NOTE — Treatment Plan (Signed)
Active     OP Depression     LTG: Increase coping skills to manage depression and improve ability to perform daily activities (Initial)     Start:  04/26/23    Expected End:  07/27/23         LTG: Shelby Gallegos will score less than 10 on the Patient Health Questionnaire (PHQ-9)  (Initial)     Start:  04/26/23    Expected End:  07/27/23         STG: Shelby Gallegos will attend at least 80% of scheduled PHP sessions    (Initial)     Start:  04/26/23    Expected End:  07/27/23         STG: Shelby Gallegos will attend at least 80% of scheduled group psychotherapy sessions  (Initial)     Start:  04/26/23    Expected End:  07/27/23         STG: Shelby Gallegos will complete at least 80% of assigned homework  (Initial)     Start:  04/26/23    Expected End:  07/27/23         STG: Shelby Gallegos will identify cognitive patterns and beliefs that support depression (Initial)     Start:  04/26/23    Expected End:  07/27/23
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
## 2023-04-27 ENCOUNTER — Other Ambulatory Visit (HOSPITAL_COMMUNITY): Payer: BC Managed Care – PPO

## 2023-04-28 ENCOUNTER — Other Ambulatory Visit (HOSPITAL_COMMUNITY): Payer: BC Managed Care – PPO

## 2023-04-29 ENCOUNTER — Encounter (HOSPITAL_COMMUNITY): Payer: Self-pay

## 2023-04-29 ENCOUNTER — Other Ambulatory Visit (HOSPITAL_COMMUNITY): Payer: BC Managed Care – PPO | Admitting: Licensed Clinical Social Worker

## 2023-04-29 ENCOUNTER — Other Ambulatory Visit (HOSPITAL_COMMUNITY): Payer: BC Managed Care – PPO

## 2023-04-29 DIAGNOSIS — Z79899 Other long term (current) drug therapy: Secondary | ICD-10-CM | POA: Diagnosis not present

## 2023-04-29 DIAGNOSIS — Z9151 Personal history of suicidal behavior: Secondary | ICD-10-CM | POA: Diagnosis not present

## 2023-04-29 DIAGNOSIS — F17290 Nicotine dependence, other tobacco product, uncomplicated: Secondary | ICD-10-CM | POA: Diagnosis not present

## 2023-04-29 DIAGNOSIS — F314 Bipolar disorder, current episode depressed, severe, without psychotic features: Secondary | ICD-10-CM

## 2023-04-29 DIAGNOSIS — R4589 Other symptoms and signs involving emotional state: Secondary | ICD-10-CM

## 2023-04-29 DIAGNOSIS — F909 Attention-deficit hyperactivity disorder, unspecified type: Secondary | ICD-10-CM | POA: Diagnosis not present

## 2023-04-29 DIAGNOSIS — F4310 Post-traumatic stress disorder, unspecified: Secondary | ICD-10-CM | POA: Diagnosis not present

## 2023-04-29 DIAGNOSIS — Z658 Other specified problems related to psychosocial circumstances: Secondary | ICD-10-CM | POA: Diagnosis not present

## 2023-04-29 DIAGNOSIS — T1491XA Suicide attempt, initial encounter: Secondary | ICD-10-CM

## 2023-04-29 DIAGNOSIS — F419 Anxiety disorder, unspecified: Secondary | ICD-10-CM | POA: Diagnosis not present

## 2023-04-29 NOTE — Progress Notes (Unsigned)
Virtual Visit via Video Note  I connected with Shelby Gallegos on 04/30/23 at  9:00 AM EDT by a video enabled telemedicine application and verified that I am speaking with the correct person using two identifiers.  Location: Patient: Home Provider: office   I discussed the limitations of evaluation and management by telemedicine and the availability of in person appointments. The patient expressed understanding and agreed to proceed.   I discussed the assessment and treatment plan with the patient. The patient was provided an opportunity to ask questions and all were answered. The patient agreed with the plan and demonstrated an understanding of the instructions.   The patient was advised to call back or seek an in-person evaluation if the symptoms worsen or if the condition fails to improve as anticipated.  I provided 15 minutes of non-face-to-face time during this encounter.   Oneta Rack, NP    Psychiatric Initial Adult Assessment   Patient Identification: Shelby Gallegos MRN:  [id number] Date of Evaluation:  04/30/2023 Referral Source: self Chief Complaint:  worsening depression and a recent suicide attempt.   Visit Diagnosis:    ICD-10-CM   1. Bipolar affective disorder, depressed, severe (HCC)  F31.4     2. Suicide attempt [facility])  T14.91XA       History of Present Illness:  Shelby Gallegos is a 42 year old female presented after a recent inpatient admission. Reported she had a recent recent suicide attempt.  States she overdosed on medications.  She reports she is currently diagnosed with major depressive disorder, bipolar disorder, attention deficit disorder and posttraumatic stress disorder.  States she is currently followed by Dr. Evelene Croon at [facility] psychiatric.  She reports multiple inpatient admissions in the past.  Reported attending intensive outpatient programming and partial hospitalization programming.  Shelby Gallegos states she is currently prescribed Lamictal, Adderall and Prozac which  she reports she has been taking and tolerating well.   She reports multiple psychosocial stressors relating to her worsening depression and anxiety.  States she had a bad break-up and feels as if her roommate was trying to manipulate her.  States her anxiety and depression has improved since her admission.  Shelby Gallegos reported that she has family support.  States she has 3 siblings who are all close to her.  States one of her siblings passed but overall her family is close.  Denies that she is ever been married.  States she recently quit her job.  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  States she has plans to go back to working for [employer] and doors at [employer].  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  Patient to start partial hospitalization program.   During evaluation Shelby Gallegos is siting; she is alert/oriented x 4; calm/cooperative; and mood congruent with affect.  Patient is speaking in a clear tone at moderate volume, and normal pace; with good eye contact. Her thought process is coherent and relevant; There is no indication that she is currently responding to internal/external stimuli or experiencing delusional thought content.  Patient denies suicidal/self-harm/homicidal ideation, psychosis, and paranoia.  Patient has remained calm throughout assessment and has answered questions appropriately.    Associated Signs/Symptoms: Depression Symptoms:  depressed mood, difficulty concentrating, suicidal attempt, anxiety, (Hypo) Manic Symptoms:  Distractibility, Impulsivity, Anxiety Symptoms:  Excessive Worry, Psychotic Symptoms:  Hallucinations: None PTSD Symptoms: Re-experiencing:  Intrusive Thoughts  Past Psychiatric History: bipolar disorder, ADHD, Borderline Personality and  suicidal ideations   Previous Psychotropic Medications: Yes   Substance Abuse History in the last 12 months:   No.  Consequences of Substance Abuse: NA  Past Medical History:  Past Medical History:  Diagnosis Date   ADHD (attention deficit hyperactivity disorder)    Bipolar 1 disorder (HCC)    Migraine    Suicide attempt by drug overdose (HCC) 02/15/2019    Past Surgical History:  Procedure Laterality Date   NO PAST SURGERIES     TENDON REPAIR Bilateral 02/20/2019   Procedure: TENDON REPAIR BILATERAL FOREARMS;  Surgeon: [name], MD;  Location: [facility];  Service: Orthopedics;  Laterality: Bilateral;   WOUND EXPLORATION Bilateral 02/20/2019   Procedure: WOUND EXPLORATION;  Surgeon: [name], MD;  Location: [facility];  Service: Orthopedics;  Laterality: Bilateral;    Family Psychiatric History:   Family History:  Family History  Problem Relation Age of Onset   Atrial fibrillation Mother    Bipolar disorder Paternal Uncle    Alcohol abuse Paternal Uncle    Depression Paternal Uncle    OCD Paternal Uncle     Social History:   Social History   Socioeconomic History   Marital status: Single    Spouse name: Not on file   Number of children: 0   Years of education: Not on file   Highest education level: Bachelor's degree (e.g., BA, AB, BS)  Occupational History   Not on file  Tobacco Use   Smoking status: Every Day    Types: Cigarettes, E-cigarettes   Smokeless tobacco: Never   Tobacco comments:    Reports she vaps for past year.   Vaping Use   Vaping Use: Every day   Start date: 11/29/2021   Substances: Nicotine, Flavoring  Substance and Sexual Activity   Alcohol use: Not Currently    Comment: Reported last use on 04/14/23 when she stated trying to overdose, none since.   Drug use: No   Sexual activity: Not Currently    Birth control/protection: None  Other Topics Concern   Not on file  Social History Narrative   Not on file   Social Determinants of Health   Financial Resource Strain: Low Risk  (03/30/2023)   Overall Financial  Resource Strain (CARDIA)    Difficulty of Paying Living Expenses: Not very hard  Food Insecurity: No Food Insecurity (04/15/2023)   Hunger Vital Sign    Worried About Running Out of Food in the Last Year: Never true    Ran Out of Food in the Last Year: Never true  Transportation Needs: No Transportation Needs (04/15/2023)   PRAPARE - Administrator, Civil Service (Medical): No    Lack of Transportation (Non-Medical): No  Physical Activity: Insufficiently Active (03/30/2023)   Exercise Vital Sign    Days of Exercise per Week: 2 days    Minutes of Exercise per Session: 20 min  Stress: Stress Concern Present (03/30/2023)   [employer] of Occupational Health - Occupational Stress Questionnaire    Feeling of Stress : Rather much  Social Connections: Moderately Integrated (03/30/2023)   Social Connection and Isolation Panel [NHANES]    Frequency of Communication with Friends and Family: More than three times a week    Frequency of Social Gatherings with Friends and Family: More than three times a week    Attends Religious Services: More than 4 times per year    Active Member of [employer] or Organizations: Yes    Attends [employer]  or Organization Meetings: More than 4 times per year    Marital Status: Never married    Additional Social History:   Allergies:   Allergies  Allergen Reactions   Onion     Triggers migraines    Metabolic Disorder Labs: Lab Results  Component Value Date   HGBA1C 5.8 (H) 04/14/2023   MPG 119.76 04/14/2023   MPG 114.02 02/19/2019   No results found for: "PROLACTIN" Lab Results  Component Value Date   CHOL 224 (H) 04/14/2023   TRIG 142 04/14/2023   HDL 55 04/14/2023   CHOLHDL 4.1 04/14/2023   VLDL 28 04/14/2023   LDLCALC 141 (H) 04/14/2023   LDLCALC 130 (H) 03/31/2023   Lab Results  Component Value Date   TSH 3.100 04/14/2023    Therapeutic Level Labs: No results found for: "LITHIUM" No results found for: "CBMZ" No results found for:  "VALPROATE"  Current Medications: Current Outpatient Medications  Medication Sig Dispense Refill   ALPRAZolam (XANAX) 0.5 MG tablet Take 0.5 mg by mouth daily as needed for anxiety.     amphetamine-dextroamphetamine (ADDERALL) 20 MG tablet Take 20 mg by mouth in the morning, at noon, and at bedtime.     cariprazine (VRAYLAR) 3 MG capsule Take 1 capsule (3 mg total) by mouth every evening. 30 capsule 0   cyanocobalamin (VITAMIN B12) 1000 MCG/ML injection Inject 1 mL (1,000 mcg total) into the muscle once for 1 dose. Inject 1 mL into the muscle once a week for 3 weeks and then once monthly 4 mL 5   Erenumab-aooe (AIMOVIG) 140 MG/ML SOAJ Inject 140 mg into the skin every 30 (thirty) days. 1.12 mL 11   lamoTRIgine (LAMICTAL) 200 MG tablet Take 400 mg by mouth at bedtime.     prazosin (MINIPRESS) 2 MG capsule Take 1 capsule (2 mg total) by mouth at bedtime. 30 capsule 0   Rimegepant Sulfate (NURTEC) 75 MG TBDP Take 75 mg by mouth as needed (For migraines).     nicotine (NICODERM CQ - DOSED IN MG/24 HOURS) 14 mg/24hr patch Place 1 patch (14 mg total) onto the skin daily. (Patient not taking: Reported on 04/29/2023) 28 patch 0   SUMAtriptan 6 MG/0.5ML SOAJ Inject 6 mg into the skin as needed. (Patient taking differently: Inject 6 mg into the skin every 2 (two) hours as needed (For migraines or headaches).) 15 mL 3   Ubrogepant (UBRELVY) 100 MG TABS Take 1 tablet (100 mg total) by mouth as needed. (Patient not taking: Reported on 04/29/2023) 10 tablet 3   No current facility-administered medications for this visit.    Musculoskeletal: Strength & Muscle Tone: within normal limits Gait & Station: normal Patient leans: N/A  Psychiatric Specialty Exam: Review of Systems  Constitutional: Negative.   Cardiovascular: Negative.   Gastrointestinal: Negative.   Psychiatric/Behavioral:  Positive for decreased concentration. The patient is nervous/anxious.   All other systems reviewed and are negative.    There were no vitals taken for this visit.There is no height or weight on file to calculate BMI.  General Appearance: Casual  Eye Contact:  Good  Speech:  Clear and Coherent  Volume:  Normal  Mood:  Anxious and Depressed  Affect:  Congruent  Thought Process:  Coherent  Orientation:  Full (Time, Place, and Person)  Thought Content:  Logical  Suicidal Thoughts:  No  Homicidal Thoughts:  No  Memory:  Immediate;   Fair Recent;   Fair  Judgement:  Good  Insight:  Good  Psychomotor  Activity:  Normal  Concentration:  Concentration: Good  Recall:  Good  Fund of Knowledge:Good  Language: Good  Akathisia:  No  Handed:  Right  AIMS (if indicated):  done  Assets:  Communication Skills Desire for Improvement Resilience Social Support  ADL's:  Intact  Cognition: WNL  Sleep:  Good   Screenings: AIMS    Flowsheet Row [facility]  AIMS Total Score 0      GAD-7    Flowsheet Row Counselor from 04/29/2023 in [facility]  Total GAD-7 Score 19      PHQ2-9    Flowsheet Row Counselor from 04/29/2023 in [facility] PROGRAM Counselor from 04/26/2023 in [facility] PROGRAM Office Visit from 03/30/2023 in [facility] Office Visit from 01/14/2022 in [facility] Office Visit from 01/13/2021 in [facility]  PHQ-2 Total Score 3 4 3 2 3   PHQ-9 Total Score 16 19 5 6 5       Flowsheet Row Counselor from 04/29/2023 in [facility] PROGRAM Counselor from 04/26/2023 in [facility]PITAL[facility]ATION PROGRAM Ad[facility] ([facility]  C-SSRS RISK CATEGORY No Risk High Risk Low Risk       Assessment and Plan:  Patient to start partial hospitalization  programming (PHP) -Continue medications as indicated by psychiatrist  Medication at discharge from inpatient hospitalization:  Continue home Lamictal 400 mg nightly Continue home Vraylar 3 mg nightly Decrease home prazosin to 2 mg nightly  START Zoloft 12.5 mg nightly.  Will start low and assess for mood activation prior to titrating dosage. Continue home Xanax 0.5 mg as needed for severe anxiety  Collaboration of Care: Medication Management AEB Continue Lamictal, adderall and minipress as directed and Psychiatrist AEB Dr. Evelene Croon  Patient/Guardian was advised Release of Information must be obtained prior to any record release in order to collaborate their care with an outside provider. Patient/Guardian was advised if they have not already done so to contact the registration department to sign all necessary forms in order for [place] to release information regarding their care.   Consent: Patient/Guardian gives verbal consent for treatment and assignment of benefits for services provided during this visit. Patient/Guardian expressed understanding and agreed to proceed.   Oneta Rack, NP 6/1/202411:01 AM

## 2023-04-29 NOTE — Progress Notes (Unsigned)
Patient presented with appropriate affect, pleasant and level mood and stated she often has suicidal ideations but no plans, intent or means to want to harm self or others at this time.  Patient stated she had been in [facility] in 2020 after another SI attempt and felt this was very helpful.  Patient acknowledged a recent attempt to harm self with use of medications and alcohol on 04/14/23 overdose and then hospitalization.  Patient rated her depression a 7 and anxiety a 10 on a scale of 0-10 with 10 being the worst today.  Stating she is often anxious and had the recent overdose attempt after multiple stressors accumulated.  Patient scored a 16 on her PHQ2/9 today and a 19 on her GAD-7.  Patient reviewed all medications, stating she had also restarted Adderall 20 mg three times a day that was stopped when inpatient but originally prescribed by Dr. Evelene Croon for reported ADHD.  Patient with no other concerns at this time and again denied any current suicidal or homicidal ideations and agreed to inform PHP staff or to reach out for emergency interventions if any such thoughts with a plan, intent or means returned.  Patient stated she felt PHP would be very helpful in returning as stated it was 4 years prior with improving her stability.

## 2023-04-30 DIAGNOSIS — Z9151 Personal history of suicidal behavior: Secondary | ICD-10-CM | POA: Insufficient documentation

## 2023-04-30 DIAGNOSIS — R4589 Other symptoms and signs involving emotional state: Secondary | ICD-10-CM | POA: Insufficient documentation

## 2023-04-30 DIAGNOSIS — F909 Attention-deficit hyperactivity disorder, unspecified type: Secondary | ICD-10-CM | POA: Insufficient documentation

## 2023-04-30 DIAGNOSIS — F319 Bipolar disorder, unspecified: Secondary | ICD-10-CM | POA: Insufficient documentation

## 2023-04-30 DIAGNOSIS — Z7389 Other problems related to life management difficulty: Secondary | ICD-10-CM | POA: Insufficient documentation

## 2023-05-01 NOTE — Psych (Signed)
Virtual Visit via Video Note  I connected with Shelby Gallegos on 04/29/23 at  9:00 AM EDT by a video enabled telemedicine application and verified that I am speaking with the correct person using two identifiers.  Location: Patient: patient home Provider: clinical home office   I discussed the limitations of evaluation and management by telemedicine and the availability of in person appointments. The patient expressed understanding and agreed to proceed.  I discussed the assessment and treatment plan with the patient. The patient was provided an opportunity to ask questions and all were answered. The patient agreed with the plan and demonstrated an understanding of the instructions.   The patient was advised to call back or seek an in-person evaluation if the symptoms worsen or if the condition fails to improve as anticipated.  Pt was provided 240 minutes of non-face-to-face time during this encounter.   [name], LCSW   [facility] PHP THERAPIST PROGRESS NOTE  Shelby Gallegos [id number]  Session Time: 9:00 - 10:00  Participation Level: Active  Behavioral Response: CasualAlertDepressed  Type of Therapy: Group Therapy  Treatment Goals addressed: Coping  Progress Towards Goals: Initial  Interventions: CBT, DBT, Supportive, and Reframing  Summary: Shelby Gallegos is a 42 y.o. female who presents with depression and mood symptoms.  Clinician led check-in regarding current stressors and situation, and review of patient completed daily inventory. Clinician utilized active listening and empathetic response and validated patient emotions. Clinician facilitated processing group on pertinent issues.?    Therapist Response: Patient arrived within time allowed. Patient rates her mood at a 7 on a scale of 1-10 with 10 being best. Pt states she feels "fairly good." Pt states she slept 4 hours and ate 2x. Pt reports feeling optimistic about group. Pt shares she saw an old friend yesterday for  catching up and legal consultation. Pt reports struggle with evicting her current roommate. Pt shares she had flashback nightmares last night which impeded her sleep. Pt reports passive SI last 2 days ago.  Patient able to process. Patient engaged in discussion.         Session Time: 10:00 am - 11:00 am   Participation Level: Active   Behavioral Response: CasualAlertDepressed   Type of Therapy: Group Therapy   Treatment Goals addressed: Coping   Progress Towards Goals: Progressing   Interventions: CBT, DBT, Solution Focused, Strength-based, Supportive, and Reframing   Therapist Response: Cln led discussion on trust and how to establish healthy trust. Group discussed common missteps such as disclosing personal information too soon, ignoring behaviors being displayed, inaccurately defining the relationship, and not giving people credit. Cln utilized CBT thought challenging principles to encourage pt's to rely on "evidence" versus feelings. Group members shared struggles they experience with trust.   Therapist Response: Pt engaged in discussion and is able to identify  areas of improvement in their trust process.          Session Time: 11:00 -12:00   Participation Level: Active   Behavioral Response: CasualAlertDepressed   Type of Therapy: Group Therapy   Treatment Goals addressed: Coping   Progress Towards Goals: Progressing   Interventions: CBT, DBT, Solution Focused, Strength-based, Supportive, and Reframing   Summary: Cln led discussion on control and the way it impacts our lives. Group members shared struggles and worked to identify the way in which control is contributing to the struggle. Cln utilized CBT thought challenging and the Catch-Challenge-Change model to address their control issues.    Therapist Response:  Pt engaged in discussion  and is able to determine ways in which control is an issue for them and brainstormed how to address it in a healthy manner.          Session Time: 12:00 -1:00   Participation Level: Active   Behavioral Response: CasualAlertDepressed   Type of Therapy: Group therapy, Occupational Therapy   Treatment Goals addressed: Coping   Progress Towards Goals: Progressing   Interventions: Supportive; Psychoeducation   Summary: 12:00 - 12:50: Occupational Therapy group led by cln [name]. 12:50 - 1:00 Clinician assessed for immediate needs, medication compliance and efficacy, and safety concerns.   Therapist Response: 12:00 - 12:50: See OT note 12:50 - 1:00 pm: At check-out, patient reports no immediate concerns. Patient demonstrates progress as evidenced by participation in first group session. Patient denies SI/HI/self-harm thoughts at the end of group.     Suicidal/Homicidal: Nowithout intent/plan  Plan: Pt will continue in PHP while working to increase mood stability, decrease depression symptoms, and increase ability to manage symptoms in a healthy manner.   Collaboration of Care: Medication Management AEB [name]  Patient/Guardian was advised Release of Information must be obtained prior to any record release in order to collaborate their care with an outside provider. Patient/Guardian was advised if they have not already done so to contact the registration department to sign all necessary forms in order for [place] to release information regarding their care.   Consent: Patient/Guardian gives verbal consent for treatment and assignment of benefits for services provided during this visit. Patient/Guardian expressed understanding and agreed to proceed.   Diagnosis: Bipolar affective disorder, depressed, severe (HCC) [F31.4]    1. Bipolar affective disorder, depressed, severe (HCC)   2. Suicide attempt (HCC)   3. Bipolar disorder, current episode depressed, severe, without psychotic features (HCC)       [name], LCSW 05/01/2023

## 2023-05-02 ENCOUNTER — Other Ambulatory Visit (HOSPITAL_COMMUNITY): Payer: BC Managed Care – PPO | Admitting: Licensed Clinical Social Worker

## 2023-05-02 ENCOUNTER — Other Ambulatory Visit (HOSPITAL_COMMUNITY): Payer: BC Managed Care – PPO | Attending: Psychiatry

## 2023-05-02 ENCOUNTER — Encounter (HOSPITAL_COMMUNITY): Payer: Self-pay

## 2023-05-02 DIAGNOSIS — R4589 Other symptoms and signs involving emotional state: Secondary | ICD-10-CM | POA: Diagnosis not present

## 2023-05-02 DIAGNOSIS — Z7389 Other problems related to life management difficulty: Secondary | ICD-10-CM | POA: Diagnosis not present

## 2023-05-02 DIAGNOSIS — F319 Bipolar disorder, unspecified: Secondary | ICD-10-CM | POA: Diagnosis not present

## 2023-05-02 DIAGNOSIS — F314 Bipolar disorder, current episode depressed, severe, without psychotic features: Secondary | ICD-10-CM

## 2023-05-02 DIAGNOSIS — Z9151 Personal history of suicidal behavior: Secondary | ICD-10-CM | POA: Diagnosis not present

## 2023-05-02 DIAGNOSIS — F909 Attention-deficit hyperactivity disorder, unspecified type: Secondary | ICD-10-CM | POA: Diagnosis not present

## 2023-05-02 NOTE — Psych (Signed)
Virtual Visit via Video Note  I connected with Shelby Gallegos on 05/02/23 at  9:00 AM EDT by a video enabled telemedicine application and verified that I am speaking with the correct person using two identifiers.  Location: Patient: patient home Provider: clinical home office   I discussed the limitations of evaluation and management by telemedicine and the availability of in person appointments. The patient expressed understanding and agreed to proceed.  I discussed the assessment and treatment plan with the patient. The patient was provided an opportunity to ask questions and all were answered. The patient agreed with the plan and demonstrated an understanding of the instructions.   The patient was advised to call back or seek an in-person evaluation if the symptoms worsen or if the condition fails to improve as anticipated.  Pt was provided 240 minutes of non-face-to-face time during this encounter.   Shelby Guiles, LCSW   [facility] PHP THERAPIST PROGRESS NOTE  Kariel Lucey [id number]  Session Time: 9:00 - 10:00  Participation Level: Active  Behavioral Response: CasualAlertDepressed  Type of Therapy: Group Therapy  Treatment Goals addressed: Coping  Progress Towards Goals: Initial  Interventions: CBT, DBT, Supportive, and Reframing  Summary: Shelby Gallegos is a 42 y.o. female who presents with depression and mood symptoms.  Clinician led check-in regarding current stressors and situation, and review of patient completed daily inventory. Clinician utilized active listening and empathetic response and validated patient emotions. Clinician facilitated processing group on pertinent issues.?    Therapist Response: Patient arrived within time allowed. Patient rates her mood at a 5 on a scale of 1-10 with 10 being best. Pt states she feels "anxious." Pt states she slept 4 hours off/on and ate 0x yesterday. Pt shares Saturday was spent with family and went really well. Pt reports Sunday  was "rough" and she went to church and became overwhelmed by the number of people. Pt states she had a panic attack and dealt with going to sleep and she slept all day. Pt reports she woke up still panicked and has increased anxiety.  Pt reports passive SI.  Patient able to process. Patient engaged in discussion.         Session Time: 10:00 am - 11:00 am   Participation Level: Active   Behavioral Response: CasualAlertDepressed   Type of Therapy: Group Therapy   Treatment Goals addressed: Coping   Progress Towards Goals: Progressing   Interventions: CBT, DBT, Solution Focused, Strength-based, Supportive, and Reframing   Therapist Response: Cln introduced wellness topic of sleep hygiene. Cln discussed ways in which poor sleep affects our mood and overall wellness. Cln provided education on sleep hygiene techniques and principles. Group discussed their current sleep issues and how they can apply sleep hygiene skills to improve their quality of sleep.    Therapist Response:  Pt engaged in discussion and identifies which sleep hygiene skill they will apply first.            Session Time: 11:00 -12:00   Participation Level: Active   Behavioral Response: CasualAlertDepressed   Type of Therapy: Group Therapy   Treatment Goals addressed: Coping   Progress Towards Goals: Progressing   Interventions: CBT, DBT, Solution Focused, Strength-based, Supportive, and Reframing   Summary: Cln led discussion on negative self-talk and how it affects Shelby Gallegos. Cln utilized CBT to discuss how thoughts shape our feelings and actions. Group members shared how negative thinking affects them and worked to reframe their negative thinking.   Therapist Response:  Pt engaged in  discussion and reports understanding.        Session Time: 12:00 -1:00   Participation Level: Active   Behavioral Response: CasualAlertDepressed   Type of Therapy: Group therapy, Occupational Therapy   Treatment Goals  addressed: Coping   Progress Towards Goals: Progressing   Interventions: Supportive; Psychoeducation   Summary: 12:00 - 12:50: Occupational Therapy group led by cln [name]. 12:50 - 1:00 Clinician assessed for immediate needs, medication compliance and efficacy, and safety concerns.   Therapist Response: 12:00 - 12:50: See OT note 12:50 - 1:00 pm: At check-out, patient reports no immediate concerns. Patient demonstrates progress as evidenced by continued engagement and responsiveness to treatment. Patient denies SI/HI/self-harm thoughts at the end of group.     Suicidal/Homicidal: Nowithout intent/plan  Plan: Pt will continue in PHP while working to increase mood stability, decrease depression symptoms, and increase ability to manage symptoms in a healthy manner.   Collaboration of Care: Medication Management A[facility] [name]  Patient/Guardian was advised Release of Information must be obtained prior to any record release in order to collaborate their care with an outside provider. Patient/Guardian was advised if they have not already done so to contact the registration department to sign all necessary forms in order for Shelby Gallegos to release information regarding their care.   Consent: Patient/Guardian gives verbal consent for treatment and assignment of benefits for services provided during this visit. Patient/Guardian expressed understanding and agreed to proceed.   Diagnosis: Bipolar disorder, current episode depressed, severe, without psychotic features (HCC) [F31.4]    1. Bipolar disorder, current episode depressed, severe, without psychotic features (HCC)       Shelby Guiles, LCSW 05/02/2023

## 2023-05-02 NOTE — Therapy (Signed)
[facility] PARTIAL HOSPITALIZATION PROGRAM [address] [place], [place], [address] Phone: [phone]   Fax:  [phone]  Occupational Therapy Evaluation Virtual Visit via Video Note  I connected with Shelby Gallegos on 05/02/23 at  8:00 AM EDT by a video enabled telemedicine application and verified that I am speaking with the correct person using two identifiers.  Location: Patient: home Provider: office   I discussed the limitations of evaluation and management by telemedicine and the availability of in person appointments. The patient expressed understanding and agreed to proceed.    The patient was advised to call back or seek an in-person evaluation if the symptoms worsen or if the condition fails to improve as anticipated.  I provided 85 minutes of non-face-to-face time during this encounter.   Patient Details  Name: Shelby Gallegos MRN: [id number] Date of Birth: [birth year] No data recorded  Encounter Date: 04/29/2023   OT End of Session - 05/02/23 1003     Visit Number 1    Number of Visits 20    Date for OT Re-Evaluation 06/01/23    OT Start Time 0930    OT Stop Time 1255   eval: 30; Tx: 55   OT Time Calculation (min) 205 min    Activity Tolerance Patient tolerated treatment well             Past Medical History:  Diagnosis Date   ADHD (attention deficit hyperactivity disorder)    Bipolar 1 disorder (HCC)    Migraine    Suicide attempt by drug overdose (HCC) 02/15/2019    Past Surgical History:  Procedure Laterality Date   NO PAST SURGERIES     TENDON REPAIR Bilateral 02/20/2019   Procedure: TENDON REPAIR BILATERAL FOREARMS;  Surgeon: [name], MD;  Location: [facility];  Service: Orthopedics;  Laterality: Bilateral;   WOUND EXPLORATION Bilateral 02/20/2019   Procedure: WOUND EXPLORATION;  Surgeon: [name], MD;  Location: [facility];  Service: Orthopedics;  Laterality: Bilateral;    There  were no vitals filed for this visit.   Subjective Assessment - 05/02/23 1001     Subjective  I hope to learn new coping skills and communication skills while I am here.    Pertinent History MDD, ADHD, BPD    Patient Stated Goals boundries, coping skills, communication, goals and interests.    Currently in Pain? No/denies    Pain Score 0-No pain    Multiple Pain Sites No                 OT Assessment  Diagnosis: MDD Past medical history/referral information: MDD / ADHD Living situation: alone / home ADLs: independent Work: na Leisure: inhibited Social support:  fair Struggles: communication / boundaries / interests  OT goal:  improve deficit areas to allow for improved participation in daily occupational performance   OCAIRS Mental Health Interview Summary of Client Scores:  Facilitates participation in occupation Allows participation in occupation Inhibits participation in occupation Restricts participation in occupation Comments:  Roles   X    Habits   X    Personal Causation   X    Values  X     Interests   X    Skills   X    Short-Term Goals  X     Long-term Goals   X    Interpretation of Past Experiences   X    Physical Environment   X    Social Environment  X     Readiness for Change   X      Need for Occupational Therapy:  4 Shows positive occupational participation, no need for OT.   3 Need for minimal intervention/consultative participation   2 Need for OT intervention indicated to restore/improve participation   1 Need for extensive OT intervention indicated to improve participation.  Referral for follow up services also recommended.   Assessment:  Patient demonstrates behavior that INHIBITS participation in occupation.  Patient will benefit from occupational therapy intervention in order to improve time management, financial management, stress management, job readiness skills, social skills, and health management skills in preparation to return to full  time community living and to be a productive community member.    Plan:  Patient will participate in skilled occupational therapy sessions individually or in a group setting to improve coping skills, psychosocial skills, and emotional skills required to return to prior level of function. Treatment will be 4-5 times per week for 4 weeks.     Group Session:  O: The objective of this presentation is to provide a comprehensive understanding of the concept of "motivation" and its role in human behavior and well-being. The content covers various theories of motivation, including intrinsic and extrinsic motivators, and explores the psychological mechanisms that drive individuals to achieve goals, overcome obstacles, and make decisions. By diving into real-world applications, the presentation aims to offer actionable strategies for enhancing motivation in different life domains, such as work, relationships, and personal growth. Utilizing a multi-disciplinary approach, this presentation integrates insights from psychology, neuroscience, and behavioral economics to present a holistic view of motivation. The objective is not only to educate the audience about the complexities and driving forces behind motivation but also to equip them with practical tools and techniques to improve their own motivation levels. By the end of the presentation, attendees should have a well-rounded understanding of what motivates human actions and how to harness this knowledge for personal and professional betterment.   A:  The patient demonstrates a high level of engagement during the session, actively participating in discussions about motivation theories and their applicability to their own life. They show keen interest in learning new strategies to improve their motivation and even offer examples from their own experiences that align with the theories presented. Their level of self-awareness and willingness to invest in  self-improvement suggest that they are well-positioned to benefit from the practical tools and techniques discussed. The patient's ability to articulate their goals and challenges further supports the likelihood of successfully implementing the strategies presented.                    OT Education - 05/02/23 1003     Education Details OCAIRS / Motivation 1    Person(s) Educated Patient    Methods Explanation;Handout    Comprehension Verbalized understanding              OT Short Term Goals - 05/02/23 1005       OT SHORT TERM GOAL #1   Title Pt will be educated on strategies to improve psychosocial skills needed to participate fully in all daily, work, and leisure activities    Time 4    Period Weeks    Status On-going    Target Date 06/01/23      OT SHORT TERM GOAL #2   Title Pt will apply psychosocial skills and coping mechanisms to daily activities in order to function independently and reintegrate into community  Time 4    Period Weeks    Status On-going    Target Date 06/01/23      OT SHORT TERM GOAL #3   Title Pt will recall and/or apply 1-3 sleep hygiene strategies to improve BADL participation prior to reintegrating into community    Time 4    Period Weeks    Status On-going    Target Date 06/01/23      OT SHORT TERM GOAL #4   Title Pt will engage in goal setting to improve BADL/IADL participation prior to reintegrating into community    Time 4    Period Weeks    Status On-going    Target Date 06/01/23      OT SHORT TERM GOAL #5   Title Pt will create and/or implement functional BADL/IADL routine prior to reintegrating into community    Time 4    Period Weeks    Status On-going    Target Date 06/01/23                      Plan - 05/02/23 1004     Clinical Impression Statement Pt presents w/ deficits across multiple psychosocial domains that inhibit participation and quality in daily occupational performance.    OT  Occupational Profile and History Problem Focused Assessment - Including review of records relating to presenting problem    Occupational performance deficits (Please refer to evaluation for details): Rest and Sleep;Leisure;Social Participation;Work    Environmental health practitioner;Habits;Routines and Behaviors;Interpersonal Interaction    Rehab Potential Good    Clinical Decision Making Limited treatment options, no task modification necessary    Comorbidities Affecting Occupational Performance: None    Modification or Assistance to Complete Evaluation  No modification of tasks or assist necessary to complete eval    OT Frequency 5x / week    OT Duration 4 weeks    OT Treatment/Interventions Psychosocial skills training;Coping strategies training    Consulted and Agree with Plan of Care Patient             Patient will benefit from skilled therapeutic intervention in order to improve the following deficits and impairments:       Psychosocial Skills: Coping Strategies, Habits, Routines and Behaviors, Interpersonal Interaction   Visit Diagnosis: Difficulty coping  Bipolar disorder, current episode depressed, severe, without psychotic features [facility])    Problem List Patient Active Problem List   Diagnosis Date Noted   Bipolar disorder, current episode depressed, severe (HCC) 04/15/2023   Hypothyroidism 03/30/2023   Prediabetes 03/30/2023   Nicotine vapor product user 03/30/2023   Suicide attempt (HCC)    Intentional acetaminophen overdose (HCC)    History of suicide attempt 04/01/2017   Borderline personality disorder (HCC) 02/16/2017   Bipolar affective disorder, depressed, severe (HCC) 08/21/2015   ADHD (attention deficit hyperactivity disorder) 08/21/2015   Headache, migraine 09/28/2014   Abnormal ECG 11/26/2009    Ted Mcalpine, OT 05/02/2023, 10:07 AM  Kerrin Champagne, OT   [facility] HOSPITALIZATION PROGRAM [address]  [address] [place], [place], [address] Phone: [phone]   Fax:  [phone]  Name: Shelby Gallegos MRN: [id number] Date of Birth: [birth year]

## 2023-05-02 NOTE — Progress Notes (Signed)
Spoke with patient via Teams video call, used 2 identifiers to correctly identify patient. States that today she is having an "off" day. Has a lot of anxiety with racing heart and not sure why. Says it happened yesterday as well. Took a xanax while on the phone with this Clinical research associate. Had passive SI last night with no plan or intent. Denies HI or AV hallucinations. On scale 1-10 as 10 being worst she rates depression at 7 and anxiety at 7. No other issues or complaints.

## 2023-05-03 ENCOUNTER — Other Ambulatory Visit (HOSPITAL_COMMUNITY): Payer: BC Managed Care – PPO

## 2023-05-03 ENCOUNTER — Encounter (HOSPITAL_COMMUNITY): Payer: Self-pay

## 2023-05-03 ENCOUNTER — Other Ambulatory Visit (HOSPITAL_COMMUNITY): Payer: BC Managed Care – PPO | Admitting: Licensed Clinical Social Worker

## 2023-05-03 DIAGNOSIS — Z9151 Personal history of suicidal behavior: Secondary | ICD-10-CM | POA: Diagnosis not present

## 2023-05-03 DIAGNOSIS — R4589 Other symptoms and signs involving emotional state: Secondary | ICD-10-CM

## 2023-05-03 DIAGNOSIS — F603 Borderline personality disorder: Secondary | ICD-10-CM

## 2023-05-03 DIAGNOSIS — F314 Bipolar disorder, current episode depressed, severe, without psychotic features: Secondary | ICD-10-CM

## 2023-05-03 DIAGNOSIS — F319 Bipolar disorder, unspecified: Secondary | ICD-10-CM | POA: Diagnosis not present

## 2023-05-03 DIAGNOSIS — Z7389 Other problems related to life management difficulty: Secondary | ICD-10-CM | POA: Diagnosis not present

## 2023-05-03 DIAGNOSIS — F909 Attention-deficit hyperactivity disorder, unspecified type: Secondary | ICD-10-CM | POA: Diagnosis not present

## 2023-05-03 MED ORDER — SERTRALINE HCL 25 MG PO TABS
12.5000 mg | ORAL_TABLET | Freq: Every day | ORAL | 0 refills | Status: DC
Start: 2023-05-03 — End: 2023-05-26

## 2023-05-03 NOTE — Progress Notes (Signed)
Virtual Visit via Video Note  I connected with Shelby Gallegos on 05/03/23 at  9:00 AM EDT by a video enabled telemedicine application and verified that I am speaking with the correct person using two identifiers.  Location: Patient: Home Provider: Office   I discussed the limitations of evaluation and management by telemedicine and the availability of in person appointments. The patient expressed understanding and agreed to proceed.      I discussed the assessment and treatment plan with the patient. The patient was provided an opportunity to ask questions and all were answered. The patient agreed with the plan and demonstrated an understanding of the instructions.   The patient was advised to call back or seek an in-person evaluation if the symptoms worsen or if the condition fails to improve as anticipated.     Shelby Morton, MD  [facility] MD/PA/NP OP Progress Note  05/03/2023 11:23 AM Shelby Gallegos  MRN:  [id number]  Chief Complaint:  Chief Complaint  Patient presents with   Depression   Anxiety   HPI: Shelby Gallegos is a 42 year old female with a past psychiatric history of bipolar 1 disorder, ADHD, and prior suicide attempts via OD.  prazosin to 2 mg nightly  Vraylar 3 mg nightly  Lamictal 400 mg nightly    Patient has been taking Adderall 20mg  (clarified that patient should not be taking this any longer) Xanax 0.5mg  , daily PRN  Patient reports that she is not sure that Zoloft was sedating. Patient reports that feels that her anxiety is worse. Patient reports she is having more frequent panic attacks. Patient reports that she is not sure why her anxiety is so high. Patient reports that when she has panic attacks her heart starts to race and her finger become tingly and she has tachypnea. Patient reports that she does think large crowds can make her anxious, but this is the only trigger she has been able to identify. Patient reports that she has been sleeping more than usual, but  last night she trouble sleeping and had SI. Patient reports that she called 39 and talked to her for 1hr until her sleep meds kicked in and she was able to go to school. Patient reports that she normally has nightmares that make it hard to fall asleep, but the Prazosin helps with this.   Last night patient reports that she was ruminating on a plan, to harm herself and she was struggling to come up with a coping skill. Patient reports that her plan last night was to jump off an overpass. Patient reports that she did have a SA in 2020 because she took 2 bottles of Xanax and cut her arms.   Patient reports that last week she felt like there was too much going on and she took 7-8 pills of trazodone and then drank with it. Patient reports that she did this because she was trying to "calm down and take the edge off" and did not care if she would wake up or not. Patient reports that she does have some positive coping skills like calling a friend, Disney movies, or watch TV but that day her thoughts were going to fast. Patient reports that she feels overwhelmed by things such as court dates for evicting her friend out of the house.   Patient endorses that she does not think she truly wants to kill herself, but she fears the unknown and her self harm is more impulsive and she later regrets it.  Patient denies SI, HI, and AVH. Patient reports that she has low mood and low energy. Patient denies anhedonia.she is able to find joy in spending time with her niece, nephew, and dog. Patient reports that she has hobbies such as Public librarian. She really enjoys sanding, it is calming for her. Patient reports a poor appetite, but she has not had significant weight loss.   Patient reports that she is not waking up with severe headaches or light headedness.  Patient reports that feel like she can keep herself safe.   Visit Diagnosis:    ICD-10-CM   1. Borderline personality disorder (HCC)  F60.3     2.  Bipolar disorder, current episode depressed, severe, without psychotic features (HCC)  F31.4 sertraline (ZOLOFT) 25 MG tablet      Past Psychiatric History:  INPT: 4x, ( SI, SI, SA in 2020, and 2024 ) SA in 2020 OPT: Dr. Evelene Gallegos for 23 years Therapy: yes in the past and has one currently, Shelby Gallegos @ [facility] Medications: Prozac as a teen had SI, cymbalta, effexor, Abilify, depakote (did not do well), caplyta (symptomatic hypotension), trileptal, Geodon,     Past Medical History:  Past Medical History:  Diagnosis Date   ADHD (attention deficit hyperactivity disorder)    Bipolar 1 disorder (HCC)    Migraine    Suicide attempt by drug overdose (HCC) 02/15/2019    Past Surgical History:  Procedure Laterality Date   NO PAST SURGERIES     TENDON REPAIR Bilateral 02/20/2019   Procedure: TENDON REPAIR BILATERAL FOREARMS;  Surgeon: [name], MD;  Location: [facility];  Service: Orthopedics;  Laterality: Bilateral;   WOUND EXPLORATION Bilateral 02/20/2019   Procedure: WOUND EXPLORATION;  Surgeon: [name], MD;  Location: [facility];  Service: Orthopedics;  Laterality: Bilateral;    Family Psychiatric History:   P Uncle: Bipolar A lot of fmaily with Etoh use d/o  Family History:  Family History  Problem Relation Age of Onset   Atrial fibrillation Mother    Bipolar disorder Paternal Uncle    Alcohol abuse Paternal Uncle    Depression Paternal Uncle    OCD Paternal Uncle     Social History:  Social History   Socioeconomic History   Marital status: Single    Spouse name: Not on file   Number of children: 0   Years of education: Not on file   Highest education level: Bachelor's degree (e.g., BA, AB, BS)  Occupational History   Not on file  Tobacco Use   Smoking status: Every Day    Types: Cigarettes, E-cigarettes   Smokeless tobacco: Never   Tobacco comments:    Reports she vaps for past year.   Vaping Use   Vaping Use: Every  day   Start date: 11/29/2021   Substances: Nicotine, Flavoring  Substance and Sexual Activity   Alcohol use: Not Currently    Comment: Reported last use on 04/14/23 when she stated trying to overdose, none since.   Drug use: No   Sexual activity: Not Currently    Birth control/protection: None  Other Topics Concern   Not on file  Social History Narrative   Not on file   Social Determinants of Health   Financial Resource Strain: Low Risk  (03/30/2023)   Overall Financial Resource Strain (CARDIA)    Difficulty of Paying Living Expenses: Not very hard  Food Insecurity: No Food Insecurity (04/15/2023)   Hunger Vital Sign    Worried About  Running Out of Food in the Last Year: Never true    Ran Out of Food in the Last Year: Never true  Transportation Needs: No Transportation Needs (04/15/2023)   PRAPARE - Administrator, Civil Service (Medical): No    Lack of Transportation (Non-Medical): No  Physical Activity: Insufficiently Active (03/30/2023)   Exercise Vital Sign    Days of Exercise per Week: 2 days    Minutes of Exercise per Session: 20 min  Stress: Stress Concern Present (03/30/2023)   [employer] of Occupational Health - Occupational Stress Questionnaire    Feeling of Stress : Rather much  Social Connections: Moderately Integrated (03/30/2023)   Social Connection and Isolation Panel [NHANES]    Frequency of Communication with Friends and Family: More than three times a week    Frequency of Social Gatherings with Friends and Family: More than three times a week    Attends Religious Services: More than 4 times per year    Active Member of [employer] or Organizations: Yes    Attends Banker Meetings: More than 4 times per year    Marital Status: Never married    Allergies:  Allergies  Allergen Reactions   Onion     Triggers migraines    Metabolic Disorder Labs: Lab Results  Component Value Date   HGBA1C 5.8 (H) 04/14/2023   MPG 119.76 04/14/2023   MPG  114.02 02/19/2019   No results found for: "PROLACTIN" Lab Results  Component Value Date   CHOL 224 (H) 04/14/2023   TRIG 142 04/14/2023   HDL 55 04/14/2023   CHOLHDL 4.1 04/14/2023   VLDL 28 04/14/2023   LDLCALC 141 (H) 04/14/2023   LDLCALC 130 (H) 03/31/2023   Lab Results  Component Value Date   TSH 3.100 04/14/2023   TSH 2.750 03/31/2023    Therapeutic Level Labs: No results found for: "LITHIUM" No results found for: "VALPROATE" No results found for: "CBMZ"  Current Medications: Current Outpatient Medications  Medication Sig Dispense Refill   sertraline (ZOLOFT) 25 MG tablet Take 0.5 tablets (12.5 mg total) by mouth at bedtime. 15 tablet 0   ALPRAZolam (XANAX) 0.5 MG tablet Take 0.5 mg by mouth daily as needed for anxiety.     amphetamine-dextroamphetamine (ADDERALL) 20 MG tablet Take 20 mg by mouth in the morning, at noon, and at bedtime.     cariprazine (VRAYLAR) 3 MG capsule Take 1 capsule (3 mg total) by mouth every evening. 30 capsule 0   cyanocobalamin (VITAMIN B12) 1000 MCG/ML injection Inject 1 mL (1,000 mcg total) into the muscle once for 1 dose. Inject 1 mL into the muscle once a week for 3 weeks and then once monthly 4 mL 5   Erenumab-aooe (AIMOVIG) 140 MG/ML SOAJ Inject 140 mg into the skin every 30 (thirty) days. 1.12 mL 11   lamoTRIgine (LAMICTAL) 200 MG tablet Take 400 mg by mouth at bedtime.     nicotine (NICODERM CQ - DOSED IN MG/24 HOURS) 14 mg/24hr patch Place 1 patch (14 mg total) onto the skin daily. (Patient not taking: Reported on 04/29/2023) 28 patch 0   prazosin (MINIPRESS) 2 MG capsule Take 1 capsule (2 mg total) by mouth at bedtime. 30 capsule 0   Rimegepant Sulfate (NURTEC) 75 MG TBDP Take 75 mg by mouth as needed (For migraines).     SUMAtriptan 6 MG/0.5ML SOAJ Inject 6 mg into the skin as needed. (Patient taking differently: Inject 6 mg into the skin every 2 (two)  hours as needed (For migraines or headaches).) 15 mL 3   Ubrogepant (UBRELVY) 100 MG  TABS Take 1 tablet (100 mg total) by mouth as needed. (Patient not taking: Reported on 05/02/2023) 10 tablet 3   No current facility-administered medications for this visit.     Psychiatric Specialty Exam: Review of Systems  Psychiatric/Behavioral:  Positive for sleep disturbance. Negative for hallucinations and suicidal ideas. The patient is nervous/anxious.     There were no vitals taken for this visit.There is no height or weight on file to calculate BMI.  General Appearance: Casual  Eye Contact:  Good  Speech:  Clear and Coherent  Volume:  Normal  Mood:  Euthymic  Affect:  Appropriate  Thought Process:  Coherent  Orientation:  Full (Time, Place, and Person)  Thought Content: Logical   Suicidal Thoughts:  No  Homicidal Thoughts:  No  Memory:  Immediate;   Good Recent;   Good  Judgement:  Fair  Insight:  Shallow  Psychomotor Activity:  Normal  Concentration:  Concentration: Good  Recall:  NA  Fund of Knowledge: Good  Language: Good  Akathisia:  NA  Handed:    AIMS (if indicated): not done  Assets:  Communication Skills Desire for Improvement Housing Resilience  ADL's:  Intact  Cognition: WNL  Sleep:  Fair   Screenings: AIMS    Flowsheet Row [facility]  AIMS Total Score 0      GAD-7    Flowsheet Row Counselor from [facility]/29/2023 [facility]  Total GAD-7 Score 19      PHQ2-9    Flowsheet Row Counselor from 04/29/2023 in [facility] Counselor from 04/26/2023 in [facility] Office Visit from 03/30/2023 in [facility] Office Visit from 01/14/2022 in [facility] Office Visit from 01/13/2021 in [facility]  PHQ-2 Total Score 3 4 3 2 3   PHQ-9 Total Score 16 19 5 6 5       Flowsheet Row Counselor from  04/29/2023 in [facility] Counselor from 04/26/2023 in [facility]  C-SSRS RISK CATEGORY No Risk High Risk Low Risk        Assessment and Plan: Patient endorses impulsivity likely secondary to borderline personality disorder traits, in the setting of wanting to harm herself when she cannot process her emotions well.  While patient is endorsing passive SI in the last 24 hours she denies that on assessment this a.m. and endorses multiple time start assessment that she is able to keep herself safe.  Patient also exhibited last night using appropriate skills such as calling [phone] to talk to someone when she felt like she was ruminating on SI thoughts.  Patient was able to come up again with a safety plan today and has used resources in the past indicating that she will likely use them again if she feels she would harm herself.  Given patient's borderline personality disorder, do not think at this time she poses enough of a safety risk to warrant IVC as she is denying SI currently and is engaging in daily therapy to work on Pharmacologist.  We will start patient on low-dose of Zoloft to help with continued anxiety as this may also help patient achieve her goal of getting to a place where she can  use the skills she is learning when placed in stressful situations.  Patient did indicate that she was willing to give Zoloft another try as she was not sure it was truly the reason why she felt sedated in the hospital, but cannot recall having this conversation in the hospital.  Safety planning done with patient. Borderline personality disorder Bipolar disorder, current episode depressed GAD - Start Zoloft 12.5 mg nightly, we will continue to monitor for symptoms of hypomania  - Continue Lamictal 400 mg nightly - Continue prazosin 2 mg nightly - Continue  Vraylar 3 mg nightly - Continue Xanax 0.5 mg daily as needed  ADHD - Discontinue Adderall 20 mg daily  Nicotine vapor product user - Recommended patient start using nicotine patches 14 mg daily that she was prescribed at discharge from hospital, patient endorsed understanding  Collaboration of Care: Collaboration of Care: PHP team  Patient/Guardian was advised Release of Information must be obtained prior to any record release in order to collaborate their care with an outside provider. Patient/Guardian was advised if they have not already done so to contact the registration department to sign all necessary forms in order for [place] to release information regarding their care.   Consent: Patient/Guardian gives verbal consent for treatment and assignment of benefits for services provided during this visit. Patient/Guardian expressed understanding and agreed to proceed.   PGY-3 Shelby Morton, MD 05/03/2023, 11:24 AM

## 2023-05-03 NOTE — Therapy (Signed)
[facility] PARTIAL HOSPITALIZATION PROGRAM [address] [place], [place], [address] Phone: [phone]   Fax:  [phone]  Occupational Therapy Treatment Virtual Visit via Video Note  I connected with [name] on 05/03/23 at  8:00 AM EDT by a video enabled telemedicine application and verified that I am speaking with the correct person using two identifiers.  Location: Patient: home Provider: office   I discussed the limitations of evaluation and management by telemedicine and the availability of in person appointments. The patient expressed understanding and agreed to proceed.    The patient was advised to call back or seek an in-person evaluation if the symptoms worsen or if the condition fails to improve as anticipated.  I provided 55 minutes of non-face-to-face time during this encounter.   Patient Details  Name: Nyomie Lozo MRN: [id number] Date of Birth: [birth year] No data recorded  Encounter Date: 05/02/2023   OT End of Session - 05/03/23 0901     Visit Number 2    Number of Visits 20    Date for OT Re-Evaluation 06/01/23    OT Start Time 1200    OT Stop Time 1255    OT Time Calculation (min) 55 min             Past Medical History:  Diagnosis Date   ADHD (attention deficit hyperactivity disorder)    Bipolar 1 disorder (HCC)    Migraine    Suicide attempt by drug overdose (HCC) 02/15/2019    Past Surgical History:  Procedure Laterality Date   NO PAST SURGERIES     TENDON REPAIR Bilateral 02/20/2019   Procedure: TENDON REPAIR BILATERAL FOREARMS;  Surgeon: [name], MD;  Location: [facility];  Service: Orthopedics;  Laterality: Bilateral;   WOUND EXPLORATION Bilateral 02/20/2019   Procedure: WOUND EXPLORATION;  Surgeon: [name], MD;  Location: [facility];  Service: Orthopedics;  Laterality: Bilateral;    There were no vitals filed for this visit.   Subjective Assessment - 05/03/23  0901     Currently in Pain? No/denies    Pain Score 0-No pain                 Group Session:  S: I'm okay today.   O: The objective of this presentation is to provide a comprehensive understanding of the concept of "motivation" and its role in human behavior and well-being. The content covers various theories of motivation, including intrinsic and extrinsic motivators, and explores the psychological mechanisms that drive individuals to achieve goals, overcome obstacles, and make decisions. By diving into real-world applications, the presentation aims to offer actionable strategies for enhancing motivation in different life domains, such as work, relationships, and personal growth. Utilizing a multi-disciplinary approach, this presentation integrates insights from psychology, neuroscience, and behavioral economics to present a holistic view of motivation. The objective is not only to educate the audience about the complexities and driving forces behind motivation but also to equip them with practical tools and techniques to improve their own motivation levels. By the end of the presentation, attendees should have a well-rounded understanding of what motivates human actions and how to harness this knowledge for personal and professional betterment.   A:  The patient attends the session but shows minimal active involvement in the discussion or activities. They listen to the information provided but do not ask questions or volunteer personal experiences that could deepen their understanding of motivation. Their body language, such as limited eye  contact and minimal verbal feedback, indicates a passive engagement level, suggesting a potential barrier to fully benefiting from the educational content. The patient's passive approach may require more directed intervention to ensure they grasp and can apply the principles of motivation to their life.   P: Continue to attend PHP OT group sessions 5x  week for 4 weeks to promote daily structure, social engagement, and opportunities to develop and utilize adaptive strategies to maximize functional performance in preparation for safe transition and integration back into school, work, and the community. Plan to address topic of pt 3 in next OT group session.                  OT Education - 05/03/23 0901     Education Details Motivation 2              OT Short Term Goals - 05/02/23 1005       OT SHORT TERM GOAL #1   Title Pt will be educated on strategies to improve psychosocial skills needed to participate fully in all daily, work, and leisure activities    Time 4    Period Weeks    Status On-going    Target Date 06/01/23      OT SHORT TERM GOAL #2   Title Pt will apply psychosocial skills and coping mechanisms to daily activities in order to function independently and reintegrate into community    Time 4    Period Weeks    Status On-going    Target Date 06/01/23      OT SHORT TERM GOAL #3   Title Pt will recall and/or apply 1-3 sleep hygiene strategies to improve BADL participation prior to reintegrating into community    Time 4    Period Weeks    Status On-going    Target Date 06/01/23      OT SHORT TERM GOAL #4   Title Pt will engage in goal setting to improve BADL/IADL participation prior to reintegrating into community    Time 4    Period Weeks    Status On-going    Target Date 06/01/23      OT SHORT TERM GOAL #5   Title Pt will create and/or implement functional BADL/IADL routine prior to reintegrating into community    Time 4    Period Weeks    Status On-going    Target Date 06/01/23                      Plan - 05/03/23 0902     Psychosocial Skills Coping Strategies;Habits;Routines and Behaviors;Interpersonal Interaction             Patient will benefit from skilled therapeutic intervention in order to improve the following deficits and impairments:       Psychosocial  Skills: Coping Strategies, Habits, Routines and Behaviors, Interpersonal Interaction   Visit Diagnosis: Difficulty coping    Problem List Patient Active Problem List   Diagnosis Date Noted   Bipolar disorder, current episode depressed, severe (HCC) 04/15/2023   Hypothyroidism 03/30/2023   Prediabetes 03/30/2023   Nicotine vapor product user 03/30/2023   Suicide attempt [facility])    Intentional acetaminophen overdose (HCC)    History of suicide attempt 04/01/2017   Borderline personality disorder (HCC) 02/16/2017   Bipolar affective disorder, depressed, severe (HCC) 08/21/2015   ADHD (attention deficit hyperactivity disorder) 08/21/2015   Headache, migraine 09/28/2014   Abnormal ECG 11/26/2009    Ted Mcalpine, OT 05/03/2023,  9:02 AM  Kerrin Champagne, OT   [facility] HOSPITALIZATION PROGRAM [address] [place], [place], [address] Phone: [phone]   Fax:  [phone]  Name: Theresea Albanese MRN: [id number] Date of Birth: Jan 24, 1981

## 2023-05-03 NOTE — Therapy (Signed)
[facility] PARTIAL HOSPITALIZATION PROGRAM [address] [place], [place], [address] Phone: [phone]   Fax:  [phone]  Occupational Therapy Treatment Virtual Visit via Video Note  I connected with [name] on 05/03/23 at  8:00 AM EDT by a video enabled telemedicine application and verified that I am speaking with the correct person using two identifiers.  Location: Patient: home Provider: office   I discussed the limitations of evaluation and management by telemedicine and the availability of in person appointments. The patient expressed understanding and agreed to proceed.    The patient was advised to call back or seek an in-person evaluation if the symptoms worsen or if the condition fails to improve as anticipated.  I provided 55 minutes of non-face-to-face time during this encounter.   Patient Details  Name: Shelby Gallegos MRN: [id number] Date of Birth: 1981/04/10 No data recorded  Encounter Date: 05/03/2023   OT End of Session - 05/03/23 1759     Visit Number 3    Number of Visits 20    Date for OT Re-Evaluation 06/01/23    OT Start Time 1200    OT Stop Time 1255    OT Time Calculation (min) 55 min             Past Medical History:  Diagnosis Date   ADHD (attention deficit hyperactivity disorder)    Bipolar 1 disorder (HCC)    Migraine    Suicide attempt by drug overdose (HCC) 02/15/2019    Past Surgical History:  Procedure Laterality Date   NO PAST SURGERIES     TENDON REPAIR Bilateral 02/20/2019   Procedure: TENDON REPAIR BILATERAL FOREARMS;  Surgeon: [name], MD;  Location: [facility];  Service: Orthopedics;  Laterality: Bilateral;   WOUND EXPLORATION Bilateral 02/20/2019   Procedure: WOUND EXPLORATION;  Surgeon: [name], MD;  Location:  SUR[facility]ERY CENTER;  Service: Orthopedics;  Laterality: Bilateral;    There were no vitals filed for this visit.   Subjective Assessment - 05/03/23  1759     Currently in Pain? No/denies    Pain Score 0-No pain                 Group Session:  S: Doing better today I believe.  O: During the group therapy session, the occupational therapist discussed the impact of sleep disturbances on daily activities and overall health and wellbeing.   The OT also reviewed various types of sleep disorders, including insomnia, sleep apnea, restless leg syndrome, and narcolepsy, and their associated symptoms. Strategies for managing and treating sleep disturbances were also discussed, such as establishing a consistent sleep routine, avoiding stimulants before bedtime, and engaging in relaxation techniques.   Today's group also included information on how sleep disturbances can cause fatigue, mood changes, cognitive impairment, and physical health problems, and emphasizes the importance of seeking prompt treatment to maintain overall health and wellbeing.   A: In today's session, the patient demonstrated active engagement with the topic of The Importance of Sleep. They eagerly asked questions, contributed personal experiences, and showcased a noticeable eagerness to apply the discussed principles. Their participation indicated not only a strong understanding of the subject matter but also an intrinsic motivation to implement better sleep practices in their daily routine. Based on their proactive involvement, it is assessed that the patient greatly benefited from today's treatment and will likely make efforts to incorporate the insights gained.    P: Continue to attend PHP OT group sessions  5x week for 4 weeks to promote daily structure, social engagement, and opportunities to develop and utilize adaptive strategies to maximize functional performance in preparation for safe transition and integration back into school, work, and the community. Plan to address topic of tbd in next OT group session.                  OT Education - 05/03/23  1759     Education Details Sleep 1              OT Short Term Goals - 05/02/23 1005       OT SHORT TERM GOAL #1   Title Pt will be educated on strategies to improve psychosocial skills needed to participate fully in all daily, work, and leisure activities    Time 4    Period Weeks    Status On-going    Target Date 06/01/23      OT SHORT TERM GOAL #2   Title Pt will apply psychosocial skills and coping mechanisms to daily activities in order to function independently and reintegrate into community    Time 4    Period Weeks    Status On-going    Target Date 06/01/23      OT SHORT TERM GOAL #3   Title Pt will recall and/or apply 1-3 sleep hygiene strategies to improve BADL participation prior to reintegrating into community    Time 4    Period Weeks    Status On-going    Target Date 06/01/23      OT SHORT TERM GOAL #4   Title Pt will engage in goal setting to improve BADL/IADL participation prior to reintegrating into community    Time 4    Period Weeks    Status On-going    Target Date 06/01/23      OT SHORT TERM GOAL #5   Title Pt will create and/or implement functional BADL/IADL routine prior to reintegrating into community    Time 4    Period Weeks    Status On-going    Target Date 06/01/23                      Plan - 05/03/23 1800     Psychosocial Skills Coping Strategies;Habits;Routines and Behaviors;Interpersonal Interaction             Patient will benefit from skilled therapeutic intervention in order to improve the following deficits and impairments:       Psychosocial Skills: Coping Strategies, Habits, Routines and Behaviors, Interpersonal Interaction   Visit Diagnosis: Difficulty coping    Problem List Patient Active Problem List   Diagnosis Date Noted   Bipolar disorder, current episode depressed, severe (HCC) 04/15/2023   Hypothyroidism 03/30/2023   Prediabetes 03/30/2023   Nicotine vapor product user 03/30/2023    Suicide attempt [facility])    Intentional acetaminophen overdose (HCC)    History of suicide attempt 04/01/2017   Borderline personality disorder (HCC) 02/16/2017   Bipolar affective disorder, depressed, severe (HCC) 08/21/2015   ADHD (attention deficit hyperactivity disorder) 08/21/2015   Headache, migraine 09/28/2014   Abnormal ECG 11/26/2009    Ted Mcalpine, OT 05/03/2023, 6:00 PM Kerrin Champagne, OT  [facility] HOSPITALIZATION PROGRAM [address] [place], [place], [address] Phone: [phone]   Fax:  [phone]  Name: Shelby Gallegos MRN: [id number] Date of Birth: [birth year]

## 2023-05-04 ENCOUNTER — Other Ambulatory Visit (HOSPITAL_COMMUNITY): Payer: BC Managed Care – PPO | Admitting: Licensed Clinical Social Worker

## 2023-05-04 ENCOUNTER — Other Ambulatory Visit (HOSPITAL_COMMUNITY): Payer: BC Managed Care – PPO

## 2023-05-04 DIAGNOSIS — R4589 Other symptoms and signs involving emotional state: Secondary | ICD-10-CM | POA: Diagnosis not present

## 2023-05-04 DIAGNOSIS — Z9151 Personal history of suicidal behavior: Secondary | ICD-10-CM | POA: Diagnosis not present

## 2023-05-04 DIAGNOSIS — F314 Bipolar disorder, current episode depressed, severe, without psychotic features: Secondary | ICD-10-CM

## 2023-05-04 DIAGNOSIS — F603 Borderline personality disorder: Secondary | ICD-10-CM

## 2023-05-04 DIAGNOSIS — Z7389 Other problems related to life management difficulty: Secondary | ICD-10-CM | POA: Diagnosis not present

## 2023-05-04 DIAGNOSIS — F319 Bipolar disorder, unspecified: Secondary | ICD-10-CM | POA: Diagnosis not present

## 2023-05-04 DIAGNOSIS — F909 Attention-deficit hyperactivity disorder, unspecified type: Secondary | ICD-10-CM | POA: Diagnosis not present

## 2023-05-05 ENCOUNTER — Encounter (HOSPITAL_COMMUNITY): Payer: Self-pay

## 2023-05-05 ENCOUNTER — Other Ambulatory Visit (HOSPITAL_COMMUNITY): Payer: BC Managed Care – PPO

## 2023-05-05 ENCOUNTER — Other Ambulatory Visit (HOSPITAL_COMMUNITY): Payer: BC Managed Care – PPO | Admitting: Licensed Clinical Social Worker

## 2023-05-05 DIAGNOSIS — F319 Bipolar disorder, unspecified: Secondary | ICD-10-CM | POA: Diagnosis not present

## 2023-05-05 DIAGNOSIS — R4589 Other symptoms and signs involving emotional state: Secondary | ICD-10-CM

## 2023-05-05 DIAGNOSIS — F603 Borderline personality disorder: Secondary | ICD-10-CM

## 2023-05-05 DIAGNOSIS — F410 Panic disorder [episodic paroxysmal anxiety] without agoraphobia: Secondary | ICD-10-CM

## 2023-05-05 DIAGNOSIS — Z7389 Other problems related to life management difficulty: Secondary | ICD-10-CM | POA: Diagnosis not present

## 2023-05-05 DIAGNOSIS — F909 Attention-deficit hyperactivity disorder, unspecified type: Secondary | ICD-10-CM | POA: Diagnosis not present

## 2023-05-05 DIAGNOSIS — Z9151 Personal history of suicidal behavior: Secondary | ICD-10-CM | POA: Diagnosis not present

## 2023-05-05 DIAGNOSIS — F314 Bipolar disorder, current episode depressed, severe, without psychotic features: Secondary | ICD-10-CM

## 2023-05-05 MED ORDER — HYDROXYZINE HCL 10 MG PO TABS
10.0000 mg | ORAL_TABLET | Freq: Every day | ORAL | 0 refills | Status: AC
Start: 2023-05-05 — End: ?

## 2023-05-05 NOTE — Therapy (Signed)
[facility] PARTIAL HOSPITALIZATION PROGRAM [address] [place], [place], [address] Phone: [phone]   Fax:  [phone]  Occupational Therapy Treatment Virtual Visit via Video Note  I connected with Shelby Gallegos on 05/05/23 at  8:00 AM EDT by a video enabled telemedicine application and verified that I am speaking with the correct person using two identifiers.  Location: Patient: home Provider: office   I discussed the limitations of evaluation and management by telemedicine and the availability of in person appointments. The patient expressed understanding and agreed to proceed.    The patient was advised to call back or seek an in-person evaluation if the symptoms worsen or if the condition fails to improve as anticipated.  I provided 55 minutes of non-face-to-face time during this encounter.  Patient Details  Name: Shelby Gallegos MRN: [id number] Date of Birth: 1981-06-05 No data recorded  Encounter Date: 05/04/2023   OT End of Session - 05/05/23 1334     Visit Number 4    Number of Visits 20    Date for OT Re-Evaluation 06/01/23    OT Start Time 1200    OT Stop Time 1255    OT Time Calculation (min) 55 min             Past Medical History:  Diagnosis Date   ADHD (attention deficit hyperactivity disorder)    Bipolar 1 disorder (HCC)    Migraine    Suicide attempt by drug overdose (HCC) 02/15/2019    Past Surgical History:  Procedure Laterality Date   NO PAST SURGERIES     TENDON REPAIR Bilateral 02/20/2019   Procedure: TENDON REPAIR BILATERAL FOREARMS;  Surgeon: [name], MD;  Location: [facility];  Service: Orthopedics;  Laterality: Bilateral;   WOUND EXPLORATION Bilateral 02/20/2019   Procedure: WOUND EXPLORATION;  Surgeon: [name], MD;  Location: [facility];  Service: Orthopedics;  Laterality: Bilateral;    There were no vitals filed for this visit.   Subjective Assessment - 05/05/23 1333      Currently in Pain? No/denies    Pain Score 0-No pain               Group Session:  S: Feeling better today  O: The primary objective of this topic is to explore and understand the concept of occupational balance in the context of daily living. The term "occupational balance" is defined broadly, encompassing all activities that occupy an individual's time and energy, including self-care, leisure, and work-related tasks. The goal is to guide participants towards achieving a harmonious blend of these activities, tailored to their personal values and life circumstances. This balance is aimed at enhancing overall well-being, not by equally distributing time across activities, but by ensuring that daily engagements are fulfilling and not draining. The content delves into identifying various barriers that individuals face in achieving occupational balance, such as overcommitment, misaligned priorities, external pressures, and lack of effective time management. The impact of these barriers on occupational performance, roles, and lifestyles is examined, highlighting issues like reduced efficiency, strained relationships, and potential health problems. Strategies for cultivating occupational balance are a key focus. These strategies include practical methods like time blocking, prioritizing tasks, establishing self-care rituals, decluttering, connecting with nature, and engaging in reflective practices. These approaches are designed to be adaptable and applicable to a wide range of life scenarios, promoting a proactive and mindful approach to daily living. The overall aim is to equip participants with the knowledge and tools  to create a balanced lifestyle that supports their mental, emotional, and physical health, thereby improving their functional performance in daily life.   A:  The patient demonstrated a high level of engagement and active participation throughout the session on occupational  balance. The patient frequently contributed to discussions, offering insightful reflections on personal experiences related to the barriers and strategies for achieving occupational balance. There was a clear understanding of the concept and an ability to relate it to their own life. The patient showed enthusiasm in learning and applying the strategies discussed, such as time blocking and self-care rituals, indicating a strong motivation to improve their occupational balance. The patient's proactive approach and responsiveness to the topic suggest a high potential for implementing these strategies effectively in their daily routine.   P: Continue to attend PHP OT group sessions 5x week for 4 weeks to promote daily structure, social engagement, and opportunities to develop and utilize adaptive strategies to maximize functional performance in preparation for safe transition and integration back into school, work, and the community. Plan to address topic of OB in next OT group session.                     OT Education - 05/05/23 1333     Education Details Occupational Balance              OT Short Term Goals - 05/02/23 1005       OT SHORT TERM GOAL #1   Title Pt will be educated on strategies to improve psychosocial skills needed to participate fully in all daily, work, and leisure activities    Time 4    Period Weeks    Status On-going    Target Date 06/01/23      OT SHORT TERM GOAL #2   Title Pt will apply psychosocial skills and coping mechanisms to daily activities in order to function independently and reintegrate into community    Time 4    Period Weeks    Status On-going    Target Date 06/01/23      OT SHORT TERM GOAL #3   Title Pt will recall and/or apply 1-3 sleep hygiene strategies to improve BADL participation prior to reintegrating into community    Time 4    Period Weeks    Status On-going    Target Date 06/01/23      OT SHORT TERM GOAL #4   Title  Pt will engage in goal setting to improve BADL/IADL participation prior to reintegrating into community    Time 4    Period Weeks    Status On-going    Target Date 06/01/23      OT SHORT TERM GOAL #5   Title Pt will create and/or implement functional BADL/IADL routine prior to reintegrating into community    Time 4    Period Weeks    Status On-going    Target Date 06/01/23                      Plan - 05/05/23 1334     Psychosocial Skills Coping Strategies;Habits;Routines and Behaviors;Interpersonal Interaction             Patient will benefit from skilled therapeutic intervention in order to improve the following deficits and impairments:       Psychosocial Skills: Coping Strategies, Habits, Routines and Behaviors, Interpersonal Interaction   Visit Diagnosis: Difficulty coping    Problem List Patient Active Problem List   Diagnosis  Date Noted   Bipolar disorder, current episode depressed, severe (HCC) 04/15/2023   Hypothyroidism 03/30/2023   Prediabetes 03/30/2023   Nicotine vapor product user 03/30/2023   Suicide attempt [facility])    Intentional acetaminophen overdose (HCC)    History of suicide attempt 04/01/2017   Borderline personality disorder (HCC) 02/16/2017   Bipolar affective disorder, depressed, severe (HCC) 08/21/2015   ADHD (attention deficit hyperactivity disorder) 08/21/2015   Headache, migraine 09/28/2014   Abnormal ECG 11/26/2009    Ted Mcalpine, OT 05/05/2023, 1:35 PM Kerrin Champagne, OT  [facility] PROGRAM [address] [place], [place], [address] Phone: [phone]   Fax:  [phone]  Name: Shelby Gallegos MRN: [id number] Date of Birth: 1981/02/12

## 2023-05-05 NOTE — Progress Notes (Signed)
Virtual Visit via Video Note  I connected with Shelby Gallegos on 05/05/23 at  9:00 AM EDT by a video enabled telemedicine application and verified that I am speaking with the correct person using two identifiers.  Location: Patient: Home Provider: Office   I discussed the limitations of evaluation and management by telemedicine and the availability of in person appointments. The patient expressed understanding and agreed to proceed.     I discussed the assessment and treatment plan with the patient. The patient was provided an opportunity to ask questions and all were answered. The patient agreed with the plan and demonstrated an understanding of the instructions.   The patient was advised to call back or seek an in-person evaluation if the symptoms worsen or if the condition fails to improve as anticipated.     [name], MD  [facility] MD/PA/NP OP Progress Note  05/05/2023 5:50 PM Shelby Gallegos  MRN:  [id number]  Chief Complaint:  Chief Complaint  Patient presents with   Follow-up   Anxiety   HPI:   Patient reports that she has not been able to complete the last 2 sessions, where she thinks she is having panic attacks and she was able to call a friend yesterday, who came and talked to her and they went away. Patient reports that she is taking her Xanax after she has them, and it may be helping.   Patient reports that when she has panic attacks her heart starts racing, feels jittery, and her limbs and forehead start to tingle, with chest tightness. Patient is not taking the Adderall any longer.   Patient denies SI, she endorses some fleeting thoughts last night. Patient reports she is sleeping ok.  Patient denies HI, and AVH.   Visit Diagnosis:    ICD-10-CM   1. Panic attacks  F41.0 hydrOXYzine (ATARAX) 10 MG tablet    2. Borderline personality disorder (HCC)  F60.3 hydrOXYzine (ATARAX) 10 MG tablet    3. Difficulty coping  R45.89 hydrOXYzine (ATARAX) 10 MG tablet      Past  Psychiatric History: INPT: 4x, ( SI, SI, SA in 2020, and 2024 ) SA in 2020 OPT: Dr. Evelene Croon for 23 years Therapy: yes in the past and has one currently, [name] @ [facility] Medications: Prozac as a teen had SI, cymbalta, effexor, Abilify, depakote (did not do well), caplyta (symptomatic hypotension), trileptal, Geodon,    Past Medical History:  Past Medical History:  Diagnosis Date   ADHD (attention deficit hyperactivity disorder)    Bipolar 1 disorder (HCC)    Migraine    Suicide attempt by drug overdose (HCC) 02/15/2019    Past Surgical History:  Procedure Laterality Date   NO PAST SURGERIES     TENDON REPAIR Bilateral 02/20/2019   Procedure: TENDON REPAIR BILATERAL FOREARMS;  Surgeon: [name], MD;  Location: [facility];  Service: Orthopedics;  Laterality: Bilateral;   WOUND EXPLORATION Bilateral 02/20/2019   Procedure: WOUND EXPLORATION;  Surgeon: [name], MD;  Location: [facility];  Service: Orthopedics;  Laterality: Bilateral;    Family Psychiatric History: P Uncle: Bipolar A lot of fmaily with Etoh use d/o   Family History:  Family History  Problem Relation Age of Onset   Atrial fibrillation Mother    Bipolar disorder Paternal Uncle    Alcohol abuse Paternal Uncle    Depression Paternal Uncle    OCD Paternal Uncle     Social History:  Social History   Socioeconomic History  Marital status: Single    Spouse name: Not on file   Number of children: 0   Years of education: Not on file   Highest education level: Bachelor's degree (e.g., BA, AB, BS)  Occupational History   Not on file  Tobacco Use   Smoking status: Every Day    Types: Cigarettes, E-cigarettes   Smokeless tobacco: Never   Tobacco comments:    Reports she vaps for past year.   Vaping Use   Vaping Use: Every day   Start date: 11/29/2021   Substances: Nicotine, Flavoring  Substance and Sexual Activity   Alcohol use: Not Currently    Comment: Reported  last use on 04/14/23 when she stated trying to overdose, none since.   Drug use: No   Sexual activity: Not Currently    Birth control/protection: None  Other Topics Concern   Not on file  Social History Narrative   Not on file   Social Determinants of Health   Financial Resource Strain: Low Risk  (03/30/2023)   Overall Financial Resource Strain (CARDIA)    Difficulty of Paying Living Expenses: Not very hard  Food Insecurity: No Food Insecurity (04/15/2023)   Hunger Vital Sign    Worried About Running Out of Food in the Last Year: Never true    Ran Out of Food in the Last Year: Never true  Transportation Needs: No Transportation Needs (04/15/2023)   PRAPARE - Administrator, Civil Service (Medical): No    Lack of Transportation (Non-Medical): No  Physical Activity: Insufficiently Active (03/30/2023)   Exercise Vital Sign    Days of Exercise per Week: 2 days    Minutes of Exercise per Session: 20 min  Stress: Stress Concern Present (03/30/2023)   [employer] of Occupational Health - Occupational Stress Questionnaire    Feeling of Stress : Rather much  Social Connections: Moderately Integrated (03/30/2023)   Social Connection and Isolation Panel [NHANES]    Frequency of Communication with Friends and Family: More than three times a week    Frequency of Social Gatherings with Friends and Family: More than three times a week    Attends Religious Services: More than 4 times per year    Active Member of [employer] or Organizations: Yes    Attends Banker Meetings: More than 4 times per year    Marital Status: Never married    Allergies:  Allergies  Allergen Reactions   Onion     Triggers migraines    Metabolic Disorder Labs: Lab Results  Component Value Date   HGBA1C 5.8 (H) 04/14/2023   MPG 119.76 04/14/2023   MPG 114.02 02/19/2019   No results found for: "PROLACTIN" Lab Results  Component Value Date   CHOL 224 (H) 04/14/2023   TRIG 142 04/14/2023    HDL 55 04/14/2023   CHOLHDL 4.1 04/14/2023   VLDL 28 04/14/2023   LDLCALC 141 (H) 04/14/2023   LDLCALC 130 (H) 03/31/2023   Lab Results  Component Value Date   TSH 3.100 04/14/2023   TSH 2.750 03/31/2023    Therapeutic Level Labs: No results found for: "LITHIUM" No results found for: "VALPROATE" No results found for: "CBMZ"  Current Medications: Current Outpatient Medications  Medication Sig Dispense Refill   hydrOXYzine (ATARAX) 10 MG tablet Take 1 tablet (10 mg total) by mouth daily. 90 tablet 0   ALPRAZolam (XANAX) 0.5 MG tablet Take 0.5 mg by mouth daily as needed for anxiety.     amphetamine-dextroamphetamine (ADDERALL) 20  MG tablet Take 20 mg by mouth in the morning, at noon, and at bedtime.     cariprazine (VRAYLAR) 3 MG capsule Take 1 capsule (3 mg total) by mouth every evening. 30 capsule 0   cyanocobalamin (VITAMIN B12) 1000 MCG/ML injection Inject 1 mL (1,000 mcg total) into the muscle once for 1 dose. Inject 1 mL into the muscle once a week for 3 weeks and then once monthly 4 mL 5   Erenumab-aooe (AIMOVIG) 140 MG/ML SOAJ Inject 140 mg into the skin every 30 (thirty) days. 1.12 mL 11   lamoTRIgine (LAMICTAL) 200 MG tablet Take 400 mg by mouth at bedtime.     nicotine (NICODERM CQ - DOSED IN MG/24 HOURS) 14 mg/24hr patch Place 1 patch (14 mg total) onto the skin daily. (Patient not taking: Reported on 04/29/2023) 28 patch 0   prazosin (MINIPRESS) 2 MG capsule Take 1 capsule (2 mg total) by mouth at bedtime. 30 capsule 0   Rimegepant Sulfate (NURTEC) 75 MG TBDP Take 75 mg by mouth as needed (For migraines).     sertraline (ZOLOFT) 25 MG tablet Take 0.5 tablets (12.5 mg total) by mouth at bedtime. 15 tablet 0   SUMAtriptan 6 MG/0.5ML SOAJ Inject 6 mg into the skin as needed. (Patient taking differently: Inject 6 mg into the skin every 2 (two) hours as needed (For migraines or headaches).) 15 mL 3   Ubrogepant (UBRELVY) 100 MG TABS Take 1 tablet (100 mg total) by mouth as  needed. (Patient not taking: Reported on 05/02/2023) 10 tablet 3   No current facility-administered medications for this visit.    \ Psychiatric Specialty Exam: Review of Systems  Psychiatric/Behavioral:  Negative for hallucinations, sleep disturbance and suicidal ideas. The patient is nervous/anxious.     There were no vitals taken for this visit.There is no height or weight on file to calculate BMI.  General Appearance: Casual  Eye Contact:  Fair  Speech:  Clear and Coherent  Volume:  Normal  Mood:  Euthymic  Affect:  Flat  Thought Process:  Coherent  Orientation:  Full (Time, Place, and Person)  Thought Content: Logical   Suicidal Thoughts:  No  Homicidal Thoughts:  No  Memory:  Immediate;   Good Recent;   Good  Judgement:  Fair  Insight:  Lacking  Psychomotor Activity:  NA  Concentration:  Concentration: Fair  Recall:  NA  Fund of Knowledge: Good  Language: Good  Akathisia:  NA  Handed:    AIMS (if indicated): not done  Assets:  Housing Leisure Time Social Support  ADL's:  Intact  Cognition: WNL  Sleep:  Good   Screenings: AIMS    Flowsheet Row [facility]  AIMS Total Score 0      GAD-7    Flowsheet Row Counselor from [facility]/29/2023 [facility]  Total GAD-7 Score 19      PHQ2-9    Flowsheet Row Counselor from 04/29/2023 in [facility] Counselor from 04/26/2023 in [facility] Office Visit from 03/30/2023 in [facility] Office Visit from 01/14/2022 in [facility] Office Visit from 01/13/2021 in [facility]  PHQ-2 Total Score 3 4 3 2 3   PHQ-9 Total Score 16 19 5 6 5       Flowsheet Row Counselor from 04/29/2023 in [facility] Counselor from 04/26/2023 in  [facility]  C-SSRS RISK CATEGORY No Risk High Risk Low Risk        Assessment and Plan: Patient continues to endorse severe anxiety and sudden panic attacks with no trigger.  Patient was just started on Zoloft in the last 48 hours, patient has never been on hydroxyzine.  We will start patient on scheduled hydroxyzine as her "panic attacks" appear to be happening around the same time of day.  Patient is able to talk herself out of these panic attacks, but is missing a significant portion of her group therapy time, because she chooses to talk to a friend outside of the group.  Patient is able to communicate true symptoms of panic attacks, however we will continue to monitor especially given patient's diagnosis of borderline personality disorder and current presentation matching this diagnosis.  The best treatment for borderline personality disorder is therapy.  Borderline personality disorder Bipolar disorder, current episode depressed GAD - Continue Zoloft 12.5 mg nightly, we will continue to monitor for symptoms of hypomania   - Continue Lamictal 400 mg nightly - Continue prazosin 2 mg nightly - Continue Vraylar 3 mg nightly - Continue Xanax 0.5 mg daily as needed -Start hydroxyzine 10 mg 3 times daily   ADHD - Discontinue Adderall 20 mg daily   Nicotine vapor product user - Recommended patient start using nicotine patches 14 mg daily that she was prescribed at discharge from hospital, patient endorsed understanding    Collaboration of Care: Collaboration of Care:   Patient/Guardian was advised Release of Information must be obtained prior to any record release in order to collaborate their care with an outside provider. Patient/Guardian was advised if they have not already done so to contact the registration department to sign all necessary forms in order for [place]  to release information regarding their care.   Consent: Patient/Guardian gives verbal consent for treatment and assignment of benefits for services provided during this visit. Patient/Guardian expressed understanding and agreed to proceed.   PGY-3 [name], MD 05/05/2023, 5:50 PM

## 2023-05-06 ENCOUNTER — Encounter (HOSPITAL_COMMUNITY): Payer: Self-pay

## 2023-05-06 ENCOUNTER — Other Ambulatory Visit (HOSPITAL_COMMUNITY): Payer: BC Managed Care – PPO | Admitting: Licensed Clinical Social Worker

## 2023-05-06 ENCOUNTER — Other Ambulatory Visit (HOSPITAL_COMMUNITY): Payer: BC Managed Care – PPO

## 2023-05-06 DIAGNOSIS — F603 Borderline personality disorder: Secondary | ICD-10-CM

## 2023-05-06 DIAGNOSIS — Z9151 Personal history of suicidal behavior: Secondary | ICD-10-CM | POA: Diagnosis not present

## 2023-05-06 DIAGNOSIS — F314 Bipolar disorder, current episode depressed, severe, without psychotic features: Secondary | ICD-10-CM

## 2023-05-06 DIAGNOSIS — R4589 Other symptoms and signs involving emotional state: Secondary | ICD-10-CM

## 2023-05-06 DIAGNOSIS — Z7389 Other problems related to life management difficulty: Secondary | ICD-10-CM | POA: Diagnosis not present

## 2023-05-06 DIAGNOSIS — F319 Bipolar disorder, unspecified: Secondary | ICD-10-CM | POA: Diagnosis not present

## 2023-05-06 DIAGNOSIS — F909 Attention-deficit hyperactivity disorder, unspecified type: Secondary | ICD-10-CM | POA: Diagnosis not present

## 2023-05-06 NOTE — Therapy (Signed)
[facility] PARTIAL HOSPITALIZATION PROGRAM [address] [place], [place], [address] Phone: [phone]   Fax:  [phone]  Occupational Therapy Treatment Virtual Visit via Video Note  I connected with Shelby Gallegos on 05/06/23 at  8:00 AM EDT by a video enabled telemedicine application and verified that I am speaking with the correct person using two identifiers.  Location: Patient: home Provider: office   I discussed the limitations of evaluation and management by telemedicine and the availability of in person appointments. The patient expressed understanding and agreed to proceed.    The patient was advised to call back or seek an in-person evaluation if the symptoms worsen or if the condition fails to improve as anticipated.  I provided 55 minutes of non-face-to-face time during this encounter.   Patient Details  Name: Shelby Gallegos MRN: [id number] Date of Birth: 22-Jan-1981 No data recorded  Encounter Date: 05/05/2023   OT End of Session - 05/06/23 1113     Visit Number 5    Number of Visits 20    Date for OT Re-Evaluation 06/01/23    OT Start Time 1200    OT Stop Time 1255    OT Time Calculation (min) 55 min             Past Medical History:  Diagnosis Date   ADHD (attention deficit hyperactivity disorder)    Bipolar 1 disorder (HCC)    Migraine    Suicide attempt by drug overdose (HCC) 02/15/2019    Past Surgical History:  Procedure Laterality Date   NO PAST SURGERIES     TENDON REPAIR Bilateral 02/20/2019   Procedure: TENDON REPAIR BILATERAL FOREARMS;  Surgeon: [name], MD;  Location: [facility];  Service: Orthopedics;  Laterality: Bilateral;   WOUND EXPLORATION Bilateral 02/20/2019   Procedure: WOUND EXPLORATION;  Surgeon: [name], MD;  Location: [facility];  Service: Orthopedics;  Laterality: Bilateral;    There were no vitals filed for this visit.   Subjective Assessment - 05/06/23  1113     Currently in Pain? No/denies    Pain Score 0-No pain                 Group Session:  S: Feeling better today.   O: The primary objective of this topic is to explore and understand the concept of occupational balance in the context of daily living. The term "occupational balance" is defined broadly, encompassing all activities that occupy an individual's time and energy, including self-care, leisure, and work-related tasks. The goal is to guide participants towards achieving a harmonious blend of these activities, tailored to their personal values and life circumstances. This balance is aimed at enhancing overall well-being, not by equally distributing time across activities, but by ensuring that daily engagements are fulfilling and not draining. The content delves into identifying various barriers that individuals face in achieving occupational balance, such as overcommitment, misaligned priorities, external pressures, and lack of effective time management. The impact of these barriers on occupational performance, roles, and lifestyles is examined, highlighting issues like reduced efficiency, strained relationships, and potential health problems. Strategies for cultivating occupational balance are a key focus. These strategies include practical methods like time blocking, prioritizing tasks, establishing self-care rituals, decluttering, connecting with nature, and engaging in reflective practices. These approaches are designed to be adaptable and applicable to a wide range of life scenarios, promoting a proactive and mindful approach to daily living. The overall aim is to equip participants with  the knowledge and tools to create a balanced lifestyle that supports their mental, emotional, and physical health, thereby improving their functional performance in daily life.   A:  The patient demonstrated a high level of engagement and active participation throughout the session on  occupational balance. The patient frequently contributed to discussions, offering insightful reflections on personal experiences related to the barriers and strategies for achieving occupational balance. There was a clear understanding of the concept and an ability to relate it to their own life. The patient showed enthusiasm in learning and applying the strategies discussed, such as time blocking and self-care rituals, indicating a strong motivation to improve their occupational balance. The patient's proactive approach and responsiveness to the topic suggest a high potential for implementing these strategies effectively in their daily routine.    P: Continue to attend PHP OT group sessions 5x week for 4 weeks to promote daily structure, social engagement, and opportunities to develop and utilize adaptive strategies to maximize functional performance in preparation for safe transition and integration back into school, work, and the community. Plan to address topic of OB cont'd in next OT group session.                  OT Education - 05/06/23 1113     Education Details Occupational Balance 2              OT Short Term Goals - 05/02/23 1005       OT SHORT TERM GOAL #1   Title Pt will be educated on strategies to improve psychosocial skills needed to participate fully in all daily, work, and leisure activities    Time 4    Period Weeks    Status On-going    Target Date 06/01/23      OT SHORT TERM GOAL #2   Title Pt will apply psychosocial skills and coping mechanisms to daily activities in order to function independently and reintegrate into community    Time 4    Period Weeks    Status On-going    Target Date 06/01/23      OT SHORT TERM GOAL #3   Title Pt will recall and/or apply 1-3 sleep hygiene strategies to improve BADL participation prior to reintegrating into community    Time 4    Period Weeks    Status On-going    Target Date 06/01/23      OT SHORT TERM  GOAL #4   Title Pt will engage in goal setting to improve BADL/IADL participation prior to reintegrating into community    Time 4    Period Weeks    Status On-going    Target Date 06/01/23      OT SHORT TERM GOAL #5   Title Pt will create and/or implement functional BADL/IADL routine prior to reintegrating into community    Time 4    Period Weeks    Status On-going    Target Date 06/01/23                      Plan - 05/06/23 1113     Psychosocial Skills Coping Strategies;Habits;Routines and Behaviors;Interpersonal Interaction             Patient will benefit from skilled therapeutic intervention in order to improve the following deficits and impairments:       Psychosocial Skills: Coping Strategies, Habits, Routines and Behaviors, Interpersonal Interaction   Visit Diagnosis: Difficulty coping    Problem List Patient Active Problem  List   Diagnosis Date Noted   Bipolar disorder, current episode depressed, severe (HCC) 04/15/2023   Hypothyroidism 03/30/2023   Prediabetes 03/30/2023   Nicotine vapor product user 03/30/2023   Suicide attempt [facility])    Intentional acetaminophen overdose (HCC)    History of suicide attempt 04/01/2017   Borderline personality disorder (HCC) 02/16/2017   Bipolar affective disorder, depressed, severe (HCC) 08/21/2015   ADHD (attention deficit hyperactivity disorder) 08/21/2015   Headache, migraine 09/28/2014   Abnormal ECG 11/26/2009    Ted Mcalpine, OT 05/06/2023, 11:14 AM  Kerrin Champagne, OT   [facility] PRO[facility] [address] [place], [place], [address] Phone: [phone]   Fax:  [phone]  Name: Shelby Gallegos MRN: [id number] Date of Birth: [birth year]

## 2023-05-07 DIAGNOSIS — F4310 Post-traumatic stress disorder, unspecified: Secondary | ICD-10-CM | POA: Diagnosis not present

## 2023-05-07 DIAGNOSIS — F3176 Bipolar disorder, in full remission, most recent episode depressed: Secondary | ICD-10-CM | POA: Diagnosis not present

## 2023-05-07 DIAGNOSIS — F3174 Bipolar disorder, in full remission, most recent episode manic: Secondary | ICD-10-CM | POA: Diagnosis not present

## 2023-05-07 DIAGNOSIS — F4321 Adjustment disorder with depressed mood: Secondary | ICD-10-CM | POA: Diagnosis not present

## 2023-05-09 ENCOUNTER — Encounter (HOSPITAL_COMMUNITY): Payer: Self-pay

## 2023-05-09 ENCOUNTER — Other Ambulatory Visit (HOSPITAL_COMMUNITY): Payer: BC Managed Care – PPO

## 2023-05-09 ENCOUNTER — Other Ambulatory Visit (HOSPITAL_COMMUNITY): Payer: BC Managed Care – PPO | Admitting: Licensed Clinical Social Worker

## 2023-05-09 DIAGNOSIS — F603 Borderline personality disorder: Secondary | ICD-10-CM

## 2023-05-09 DIAGNOSIS — Z7389 Other problems related to life management difficulty: Secondary | ICD-10-CM | POA: Diagnosis not present

## 2023-05-09 DIAGNOSIS — F319 Bipolar disorder, unspecified: Secondary | ICD-10-CM | POA: Diagnosis not present

## 2023-05-09 DIAGNOSIS — R4589 Other symptoms and signs involving emotional state: Secondary | ICD-10-CM

## 2023-05-09 DIAGNOSIS — Z9151 Personal history of suicidal behavior: Secondary | ICD-10-CM | POA: Diagnosis not present

## 2023-05-09 DIAGNOSIS — F314 Bipolar disorder, current episode depressed, severe, without psychotic features: Secondary | ICD-10-CM

## 2023-05-09 DIAGNOSIS — F4312 Post-traumatic stress disorder, chronic: Secondary | ICD-10-CM | POA: Diagnosis not present

## 2023-05-09 DIAGNOSIS — F909 Attention-deficit hyperactivity disorder, unspecified type: Secondary | ICD-10-CM | POA: Diagnosis not present

## 2023-05-09 NOTE — Therapy (Signed)
[facility] PARTIAL HOSPITALIZATION PROGRAM [address] [place], [place], [address] Phone: [phone]   Fax:  [phone]  Occupational Therapy Treatment Virtual Visit via Video Note  I connected with Shelby Gallegos on 05/09/23 at  8:00 AM EDT by a video enabled telemedicine application and verified that I am speaking with the correct person using two identifiers.  Location: Patient: home Provider: office   I discussed the limitations of evaluation and management by telemedicine and the availability of in person appointments. The patient expressed understanding and agreed to proceed.    The patient was advised to call back or seek an in-person evaluation if the symptoms worsen or if the condition fails to improve as anticipated.  I provided 55 minutes of non-face-to-face time during this encounter.   Patient Details  Name: Shelby Gallegos MRN: [id number] Date of Birth: [birth year] No data recorded  Encounter Date: 05/06/2023   OT End of Session - 05/09/23 1712     Visit Number 6    Number of Visits 20    Date for OT Re-Evaluation 06/01/23    OT Start Time 1200    OT Stop Time 1255    OT Time Calculation (min) 55 min             Past Medical History:  Diagnosis Date   ADHD (attention deficit hyperactivity disorder)    Bipolar 1 disorder (HCC)    Migraine    Suicide attempt by drug overdose (HCC) 02/15/2019    Past Surgical History:  Procedure Laterality Date   NO PAST SURGERIES     TENDON REPAIR Bilateral 02/20/2019   Procedure: TENDON REPAIR BILATERAL FOREARMS;  Surgeon: [name], MD;  Location: [facility];  Service: Orthopedics;  Laterality: Bilateral;   WOUND EXPLORATION Bilateral 02/20/2019   Procedure: WOUND EXPLORATION;  Surgeon: [name], MD;  Location: [facility];  Service: Orthopedics;  Laterality: Bilateral;    There were no vitals filed for this visit.   Subjective Assessment - 05/09/23  1711     Currently in Pain? No/denies    Pain Score 0-No pain                  Group Session:  S: Feeling better today.  O: The primary objective of this topic is to explore and understand the concept of occupational balance in the context of daily living. The term "occupational balance" is defined broadly, encompassing all activities that occupy an individual's time and energy, including self-care, leisure, and work-related tasks. The goal is to guide participants towards achieving a harmonious blend of these activities, tailored to their personal values and life circumstances. This balance is aimed at enhancing overall well-being, not by equally distributing time across activities, but by ensuring that daily engagements are fulfilling and not draining. The content delves into identifying various barriers that individuals face in achieving occupational balance, such as overcommitment, misaligned priorities, external pressures, and lack of effective time management. The impact of these barriers on occupational performance, roles, and lifestyles is examined, highlighting issues like reduced efficiency, strained relationships, and potential health problems. Strategies for cultivating occupational balance are a key focus. These strategies include practical methods like time blocking, prioritizing tasks, establishing self-care rituals, decluttering, connecting with nature, and engaging in reflective practices. These approaches are designed to be adaptable and applicable to a wide range of life scenarios, promoting a proactive and mindful approach to daily living. The overall aim is to equip participants with  the knowledge and tools to create a balanced lifestyle that supports their mental, emotional, and physical health, thereby improving their functional performance in daily life.   A:  The patient demonstrated a high level of engagement and active participation throughout the session on  occupational balance. The patient frequently contributed to discussions, offering insightful reflections on personal experiences related to the barriers and strategies for achieving occupational balance. There was a clear understanding of the concept and an ability to relate it to their own life. The patient showed enthusiasm in learning and applying the strategies discussed, such as time blocking and self-care rituals, indicating a strong motivation to improve their occupational balance. The patient's proactive approach and responsiveness to the topic suggest a high potential for implementing these strategies effectively in their daily routine.   P: Continue to attend PHP OT group sessions 5x week for 4 weeks to promote daily structure, social engagement, and opportunities to develop and utilize adaptive strategies to maximize functional performance in preparation for safe transition and integration back into school, work, and the community. Plan to address topic of tbd in next OT group session.                 OT Education - 05/09/23 1711     Education Details Occupational Balance 3              OT Short Term Goals - 05/02/23 1005       OT SHORT TERM GOAL #1   Title Pt will be educated on strategies to improve psychosocial skills needed to participate fully in all daily, work, and leisure activities    Time 4    Period Weeks    Status On-going    Target Date 06/01/23      OT SHORT TERM GOAL #2   Title Pt will apply psychosocial skills and coping mechanisms to daily activities in order to function independently and reintegrate into community    Time 4    Period Weeks    Status On-going    Target Date 06/01/23      OT SHORT TERM GOAL #3   Title Pt will recall and/or apply 1-3 sleep hygiene strategies to improve BADL participation prior to reintegrating into community    Time 4    Period Weeks    Status On-going    Target Date 06/01/23      OT SHORT TERM GOAL #4    Title Pt will engage in goal setting to improve BADL/IADL participation prior to reintegrating into community    Time 4    Period Weeks    Status On-going    Target Date 06/01/23      OT SHORT TERM GOAL #5   Title Pt will create and/or implement functional BADL/IADL routine prior to reintegrating into community    Time 4    Period Weeks    Status On-going    Target Date 06/01/23                      Plan - 05/09/23 1712     Psychosocial Skills Coping Strategies;Habits;Routines and Behaviors;Interpersonal Interaction             Patient will benefit from skilled therapeutic intervention in order to improve the following deficits and impairments:       Psychosocial Skills: Coping Strategies, Habits, Routines and Behaviors, Interpersonal Interaction   Visit Diagnosis: Difficulty coping    Problem List Patient Active Problem List  Diagnosis Date Noted   Bipolar disorder, current episode depressed, severe (HCC) 04/15/2023   Hypothyroidism 03/30/2023   Prediabetes 03/30/2023   Nicotine vapor product user 03/30/2023   Suicide attempt [facility])    Intentional acetaminophen overdose (HCC)    History of suicide attempt 04/01/2017   Borderline personality disorder (HCC) 02/16/2017   Bipolar affective disorder, depressed, severe (HCC) 08/21/2015   ADHD (attention deficit hyperactivity disorder) 08/21/2015   Headache, migraine 09/28/2014   Abnormal ECG 11/26/2009    Ted Mcalpine, OT 05/09/2023, 5:12 PM  Kerrin Champagne, OT   [facility] HOSPITALIZATION PROGRAM [address] [place], [place], [address] Phone: [phone]   Fax:  [phone]  Name: Akeela Thalman MRN: [id number] Date of Birth: 07-21-1981

## 2023-05-09 NOTE — Progress Notes (Signed)
Spoke with patient via Teams video call, used 2 identifiers to correctly identify patient. States that group is good and she is enjoying it. Has panic attacks several times a week, mostly just had a lot of anxiety. She was told by her psychiatrist that she should be taking Gabapentin but she is unsure if she needs it or not and would like to discuss it with our psychiatrist. Message sent for review. She goes to court Friday to get her roommate evicted and she has stress about that. On scale 1-10 as 10 being worst she rates depression at 3 and anxiety at 6. Denies SI/HI or AV hallucinations. No issues or complaints.

## 2023-05-09 NOTE — Therapy (Signed)
[facility] PARTIAL HOSPITALIZATION PROGRAM [address] [place], [place], [address] Phone: [phone]   Fax:  [phone]  Occupational Therapy Treatment Virtual Visit via Video Note  I connected with Shelby Gallegos on 05/09/23 at  8:00 AM EDT by a video enabled telemedicine application and verified that I am speaking with the correct person using two identifiers.  Location: Patient: home Provider: office   I discussed the limitations of evaluation and management by telemedicine and the availability of in person appointments. The patient expressed understanding and agreed to proceed.    The patient was advised to call back or seek an in-person evaluation if the symptoms worsen or if the condition fails to improve as anticipated.  I provided 55 minutes of non-face-to-face time during this encounter.  Patient Details  Name: Shelby Gallegos MRN: [id number] Date of Birth: Jan 18, 1981 No data recorded  Encounter Date: 05/09/2023   OT End of Session - 05/09/23 1729     Visit Number 7    Number of Visits 20    Date for OT Re-Evaluation 06/01/23    OT Start Time 1200    OT Stop Time 1255    OT Time Calculation (min) 55 min             Past Medical History:  Diagnosis Date   ADHD (attention deficit hyperactivity disorder)    Bipolar 1 disorder (HCC)    Migraine    Suicide attempt by drug overdose (HCC) 02/15/2019    Past Surgical History:  Procedure Laterality Date   NO PAST SURGERIES     TENDON REPAIR Bilateral 02/20/2019   Procedure: TENDON REPAIR BILATERAL FOREARMS;  Surgeon: [name], MD;  Location: [facility];  Service: Orthopedics;  Laterality: Bilateral;   WOUND EXPLORATION Bilateral 02/20/2019   Procedure: WOUND EXPLORATION;  Surgeon: [name], MD;  [facility];  Service: Orthopedics;  Laterality: Bilateral;    There were no vitals filed for this visit.   Subjective Assessment - 05/09/23  1729     Currently in Pain? No/denies    Pain Score 0-No pain                  Group Session:  S: Feeling good today actually.   O: The session centered on the neuroscientific underpinnings of motivation and its inverse relationship with procrastination. Through a review of current scientific literature, the group examined:  The function of dopamine as a critical neurotransmitter in the motivation circuitry of the brain. The physiological and psychological aspects of procrastination, including the impact of dopamine on the tendency to delay tasks. Strategies for leveraging an understanding of dopamine's role to enhance motivation, address procrastination, and foster productive habits. The objective was to provide a framework for participants to understand the neurochemical dynamics of motivation, relate these to personal experiences of procrastination, and apply evidence-based methods to improve focus and drive in both professional and personal contexts.   A:  The patient demonstrates a strong grasp of the material presented on the neurobiology of motivation and its implications for procrastination. They actively participated in discussions, showing an ability to connect scientific concepts to personal experiences. The patient exhibits insight into their own behavior patterns and expresses a keen interest in applying the strategies discussed to mitigate tendencies towards procrastination. The patient's engagement level indicates a readiness to implement behavioral changes that may enhance personal productivity and reduce procrastination.    P: Continue to attend PHP OT group sessions  5x week for 4 weeks to promote daily structure, social engagement, and opportunities to develop and utilize adaptive strategies to maximize functional performance in preparation for safe transition and integration back into school, work, and the community. Plan to address topic of pt 2 in next OT group  session.                 OT Education - 05/09/23 1729     Education Details Procrastination 1              OT Short Term Goals - 05/02/23 1005       OT SHORT TERM GOAL #1   Title Pt will be educated on strategies to improve psychosocial skills needed to participate fully in all daily, work, and leisure activities    Time 4    Period Weeks    Status On-going    Target Date 06/01/23      OT SHORT TERM GOAL #2   Title Pt will apply psychosocial skills and coping mechanisms to daily activities in order to function independently and reintegrate into community    Time 4    Period Weeks    Status On-going    Target Date 06/01/23      OT SHORT TERM GOAL #3   Title Pt will recall and/or apply 1-3 sleep hygiene strategies to improve BADL participation prior to reintegrating into community    Time 4    Period Weeks    Status On-going    Target Date 06/01/23      OT SHORT TERM GOAL #4   Title Pt will engage in goal setting to improve BADL/IADL participation prior to reintegrating into community    Time 4    Period Weeks    Status On-going    Target Date 06/01/23      OT SHORT TERM GOAL #5   Title Pt will create and/or implement functional BADL/IADL routine prior to reintegrating into community    Time 4    Period Weeks    Status On-going    Target Date 06/01/23                      Plan - 05/09/23 1729     Psychosocial Skills Coping Strategies;Habits;Routines and Behaviors;Interpersonal Interaction             Patient will benefit from skilled therapeutic intervention in order to improve the following deficits and impairments:       Psychosocial Skills: Coping Strategies, Habits, Routines and Behaviors, Interpersonal Interaction   Visit Diagnosis: Difficulty coping    Problem List Patient Active Problem List   Diagnosis Date Noted   Bipolar disorder, current episode depressed, severe (HCC) 04/15/2023   Hypothyroidism  03/30/2023   Prediabetes 03/30/2023   Nicotine vapor product user 03/30/2023   Suicide attempt [facility])    Intentional acetaminophen overdose (HCC)    History of suicide attempt 04/01/2017   Borderline personality disorder (HCC) 02/16/2017   Bipolar affective disorder, depressed, severe (HCC) 08/21/2015   ADHD (attention deficit hyperactivity disorder) 08/21/2015   Headache, migraine 09/28/2014   Abnormal ECG 11/26/2009    Ted Mcalpine, OT 05/09/2023, 5:30 PM  Kerrin Champagne, OT   [facility] HOSPITALIZATION PROGRAM [address] [place], [place], [address] Phone: [phone]   Fax:  [phone]  Name: Shelby Gallegos MRN: [id number] Date of Birth: November 06, 1981

## 2023-05-10 ENCOUNTER — Other Ambulatory Visit (HOSPITAL_COMMUNITY): Payer: BC Managed Care – PPO

## 2023-05-10 ENCOUNTER — Other Ambulatory Visit (HOSPITAL_COMMUNITY): Payer: BC Managed Care – PPO | Admitting: Licensed Clinical Social Worker

## 2023-05-10 DIAGNOSIS — R4589 Other symptoms and signs involving emotional state: Secondary | ICD-10-CM

## 2023-05-10 DIAGNOSIS — F4312 Post-traumatic stress disorder, chronic: Secondary | ICD-10-CM

## 2023-05-10 DIAGNOSIS — F319 Bipolar disorder, unspecified: Secondary | ICD-10-CM | POA: Diagnosis not present

## 2023-05-10 DIAGNOSIS — F603 Borderline personality disorder: Secondary | ICD-10-CM

## 2023-05-10 DIAGNOSIS — Z9151 Personal history of suicidal behavior: Secondary | ICD-10-CM | POA: Diagnosis not present

## 2023-05-10 DIAGNOSIS — F909 Attention-deficit hyperactivity disorder, unspecified type: Secondary | ICD-10-CM | POA: Diagnosis not present

## 2023-05-10 DIAGNOSIS — Z7389 Other problems related to life management difficulty: Secondary | ICD-10-CM | POA: Diagnosis not present

## 2023-05-10 DIAGNOSIS — F314 Bipolar disorder, current episode depressed, severe, without psychotic features: Secondary | ICD-10-CM

## 2023-05-10 DIAGNOSIS — F410 Panic disorder [episodic paroxysmal anxiety] without agoraphobia: Secondary | ICD-10-CM

## 2023-05-10 MED ORDER — PRAZOSIN HCL 1 MG PO CAPS: 1.0000 mg | ORAL_CAPSULE | Freq: Every day | ORAL | 0 refills | Status: DC

## 2023-05-10 NOTE — Progress Notes (Signed)
Virtual Visit via Video Note  I connected with Shelby Gallegos on 05/10/23 at  9:00 AM EDT by a video enabled telemedicine application and verified that I am speaking with the correct person using two identifiers.  Location: Patient: Home Provider: Office   I discussed the limitations of evaluation and management by telemedicine and the availability of in person appointments. The patient expressed understanding and agreed to proceed.     I discussed the assessment and treatment plan with the patient. The patient was provided an opportunity to ask questions and all were answered. The patient agreed with the plan and demonstrated an understanding of the instructions.   The patient was advised to call back or seek an in-person evaluation if the symptoms worsen or if the condition fails to improve as anticipated.     Shelby Morton, MD  [facility] MD/PA/NP OP Progress Note  05/10/2023 10:48 AM Shelby Gallegos  MRN:  [id number]  Chief Complaint:  Chief Complaint  Patient presents with   Depression   Anxiety   HPI: Shelby Gallegos is a 42 year old female with a past psychiatric history of bipolar 1 disorder, ADHD, and prior suicide attempts via OD.    Patient reports that she has been dong well, she feels that group has been beneficial. Patient denies any adverse side effects from the Zoloft. Patient reports that she has had some improvement in her anxiety, she has been completing full group sessions and the past weekend went really well. Patient reports that she has not had any panic attacks since Last Thursday. Patient  reports that she does think the hydroxyzine has really helped. Patient reports that she has a a fairly stable appetite like 1-2 meals/ day. Patient reports that her sleep is still fluctuating, but she averages 8h normally, but last night she went to bed later. Patient reports that she took her prazosin later as well, and does make her feel drowsy in the AM but she takes it for nightmares.  Patient reports that if not necessary she would not like to take it. Patient reports that the hydroxyzine does not make her feel extremely sedated, when she takes it instead she feels "calm." Patient reports that she has not required the Xanax everyday, since starting the Hydroxyzine. Patient has been making lego creations now as her new hobby. Patient also likes to color and paint. Patient denies SI, HI, and AVH. Patient endorses that she still has her chronic passive SI, but she feels like she has more control, 988 is still her go to tool during the night, usually after calling she feels better.   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   Patient reports that she may taker her Xanax on Friday before she goes to court/ mediation for evicting her roommate.   Did clarify with patient to hold on the gabapentin due to polypharmacy concerns and benefiting from hydroxyzine.  Patient has not yet tried the NRT patches, but she is still considering feeling the prescription.    Visit Diagnosis:    ICD-10-CM   1. Borderline personality disorder (HCC)  F60.3     2. Nightmares associated with chronic post-traumatic stress disorder  F51.5 prazosin (MINIPRESS) 1 MG capsule   F43.12     3. Bipolar disorder, current episode depressed, severe, without psychotic features (HCC)  F31.4     4. Panic attacks  F41.0       Past Psychiatric History: INPT:  4x, ( SI, SI, SA in 2020, and 2024 ) SA in 2020 OPT: Dr. Evelene Croon for 23 years Therapy: yes in the past and has one currently, Shelby Gallegos @ [facility] Medications: Prozac as a teen had SI, cymbalta, effexor, Abilify, depakote (did not do well), caplyta (symptomatic hypotension), trileptal, Geodon,      Past Medical History:  Past Medical History:  Diagnosis Date   ADHD (attention deficit hyperactivity disorder)    Bipolar 1 disorder (HCC)    Migraine    Suicide attempt by drug overdose (HCC)  02/15/2019    Past Surgical History:  Procedure Laterality Date   NO PAST SURGERIES     TENDON REPAIR Bilateral 02/20/2019   Procedure: TENDON REPAIR BILATERAL FOREARMS;  Surgeon: [name], MD;  Location: [facility];  Service: Orthopedics;  Laterality: Bilateral;   WOUND EXPLORATION Bilateral 02/20/2019   Procedure: WOUND EXPLORATION;  Surgeon: [name], MD;  Location: [facility];  Service: Orthopedics;  Laterality: Bilateral;    Family Psychiatric History: P Uncle: Bipolar A lot of fmaily with Etoh use d/o   Family History:  Family History  Problem Relation Age of Onset   Atrial fibrillation Mother    Bipolar disorder Paternal Uncle    Alcohol abuse Paternal Uncle    Depression Paternal Uncle    OCD Paternal Uncle     Social History:  Social History   Socioeconomic History   Marital status: Single    Spouse name: Not on file   Number of children: 0   Years of education: Not on file   Highest education level: Bachelor's degree (e.g., BA, AB, BS)  Occupational History   Not on file  Tobacco Use   Smoking status: Every Day    Types: Cigarettes, E-cigarettes   Smokeless tobacco: Never   Tobacco comments:    Reports she vaps for past year.   Vaping Use   Vaping Use: Every day   Start date: 11/29/2021   Substances: Nicotine, Flavoring  Substance and Sexual Activity   Alcohol use: Not Currently    Comment: Reported last use on 04/14/23 when she stated trying to overdose, none since.   Drug use: No   Sexual activity: Not Currently    Birth control/protection: None  Other Topics Concern   Not on file  Social History Narrative   Not on file   Social Determinants of Health   Financial Resource Strain: Low Risk  (03/30/2023)   Overall Financial Resource Strain (CARDIA)    Difficulty of Paying Living Expenses: Not very hard  Food Insecurity: No Food Insecurity (04/15/2023)   Hunger Vital Sign    Worried About Running Out of Food in the  Last Year: Never true    Ran Out of Food in the Last Year: Never true  Transportation Needs: No Transportation Needs (04/15/2023)   PRAPARE - Administrator, Civil Service (Medical): No    Lack of Transportation (Non-Medical): No  Physical Activity: Insufficiently Active (03/30/2023)   Exercise Vital Sign    Days of Exercise per Week: 2 days    Minutes of Exercise per Session: 20 min  Stress: Stress Concern Present (03/30/2023)   [employer] of Occupational Health - Occupational Stress Questionnaire    Feeling of Stress : Rather much  Social Connections: Moderately Integrated (03/30/2023)   Social Connection and Isolation Panel [NHANES]    Frequency of Communication with Friends and Family: More than three times a week  Frequency of Social Gatherings with Friends and Family: More than three times a week    Attends Religious Services: More than 4 times per year    Active Member of Clubs or Organizations: Yes    Attends Banker Meetings: More than 4 times per year    Marital Status: Never married    Allergies:  Allergies  Allergen Reactions   Onion     Triggers migraines    Metabolic Disorder Labs: Lab Results  Component Value Date   HGBA1C 5.8 (H) 04/14/2023   MPG 119.76 04/14/2023   MPG 114.02 02/19/2019   No results found for: "PROLACTIN" Lab Results  Component Value Date   CHOL 224 (H) 04/14/2023   TRIG 142 04/14/2023   HDL 55 04/14/2023   CHOLHDL 4.1 04/14/2023   VLDL 28 04/14/2023   LDLCALC 141 (H) 04/14/2023   LDLCALC 130 (H) 03/31/2023   Lab Results  Component Value Date   TSH 3.100 04/14/2023   TSH 2.750 03/31/2023    Therapeutic Level Labs: No results found for: "LITHIUM" No results found for: "VALPROATE" No results found for: "CBMZ"  Current Medications: Current Outpatient Medications  Medication Sig Dispense Refill   prazosin (MINIPRESS) 1 MG capsule Take 1 capsule (1 mg total) by mouth at bedtime. 30 capsule 0    ALPRAZolam (XANAX) 0.5 MG tablet Take 0.5 mg by mouth daily as needed for anxiety.     amphetamine-dextroamphetamine (ADDERALL) 20 MG tablet Take 20 mg by mouth in the morning, at noon, and at bedtime.     cariprazine (VRAYLAR) 3 MG capsule Take 1 capsule (3 mg total) by mouth every evening. 30 capsule 0   cyanocobalamin (VITAMIN B12) 1000 MCG/ML injection Inject 1 mL (1,000 mcg total) into the muscle once for 1 dose. Inject 1 mL into the muscle once a week for 3 weeks and then once monthly 4 mL 5   Erenumab-aooe (AIMOVIG) 140 MG/ML SOAJ Inject 140 mg into the skin every 30 (thirty) days. 1.12 mL 11   hydrOXYzine (ATARAX) 10 MG tablet Take 1 tablet (10 mg total) by mouth daily. 90 tablet 0   lamoTRIgine (LAMICTAL) 200 MG tablet Take 400 mg by mouth at bedtime.     nicotine (NICODERM CQ - DOSED IN MG/24 HOURS) 14 mg/24hr patch Place 1 patch (14 mg total) onto the skin daily. (Patient not taking: Reported on 04/29/2023) 28 patch 0   Rimegepant Sulfate (NURTEC) 75 MG TBDP Take 75 mg by mouth as needed (For migraines).     sertraline (ZOLOFT) 25 MG tablet Take 0.5 tablets (12.5 mg total) by mouth at bedtime. (Patient not taking: Reported on 05/09/2023) 15 tablet 0   SUMAtriptan 6 MG/0.5ML SOAJ Inject 6 mg into the skin as needed. (Patient taking differently: Inject 6 mg into the skin every 2 (two) hours as needed (For migraines or headaches).) 15 mL 3   Ubrogepant (UBRELVY) 100 MG TABS Take 1 tablet (100 mg total) by mouth as needed. (Patient not taking: Reported on 05/02/2023) 10 tablet 3   No current facility-administered medications for this visit.      Psychiatric Specialty Exam: Review of Systems  Psychiatric/Behavioral:  Negative for dysphoric mood, hallucinations and suicidal ideas. The patient is nervous/anxious.     There were no vitals taken for this visit.There is no height or weight on file to calculate BMI.  General Appearance: Casual  Eye Contact:  Good  Speech:  Clear and Coherent   Volume:  Normal  Mood:  Euthymic  Affect:  Appropriate  Thought Process:  Coherent  Orientation:  Full (Time, Place, and Person)  Thought Content: Logical   Suicidal Thoughts:  No  Homicidal Thoughts:  No  Memory:  Immediate;   Good Recent;   Good  Judgement:  Fair  Insight:  Shallow  Psychomotor Activity:  Normal  Concentration:  Concentration: Good  Recall:  Good  Fund of Knowledge: Good  Language: Good  Akathisia:  No  Handed:    AIMS (if indicated): not done  Assets:  Communication Skills Desire for Improvement Housing Leisure Time Resilience  ADL's:  Intact  Cognition: WNL  Sleep:  Fair   Screenings: AIMS    Flowsheet Row [facility]  AIMS Total Score 0      GAD-7    Flowsheet Row Counselor from [facility]29/20[facility]  Total GAD-7 Score 19      PHQ2-9    Flowsheet Row Counselor from 04/29/2023 in [facility] Counselor from 04/26/2023 in [facility] Office Visit from 03/30/2023 in [facility] Office Visit from 01[facility]14/2022 in [facility] Office Visit from 01/13/2021 in [facility]  PHQ-2 Total Score 3 4 3 2 3   PHQ-9 Total Score 16 19 5 6 5       Flowsheet Row Counselor from 04/29/2023 in [facility] Counselor from 04/26/2023 in [facility]  C-SSRS RISK CATEGORY No Risk High Risk Low Risk        Assessment and Plan:  Patient noting improvements with hydroxyzine, her anxiety has declined.  Patient did endorse that she feels a bit over sedated with her processing and would prefer to not take it however she does have nightmares related to  PTSD, we will decrease patient's prazosin to see if this helps minimize adverse side effects.  Also recommended that patient continue to hold off on gabapentin from outpatient provider as she is already seeing improvements on current medication regimen and would like to minimize further polypharmacy.  Patient was on board with this.  Patient endorses that she was unaware that she was put to be taking gabapentin prior to this past Saturday, we will continue to hold at this time.  Patient is participating in hobbies, endorsing planning for stressful events and trying to manage her emotions.  Borderline personality disorder Bipolar disorder, current episode depressed GAD - Continue Zoloft 12.5 mg nightly, we will continue to monitor for symptoms of hypomania - Continue Lamictal 400 mg nightly - Continue Vraylar 3 mg nightly - Continue Xanax 0.5 mg daily as needed - Continue hydroxyzine 10 mg 3 times daily  - decrease prazosin to 1mg  QHS - Hold gabapentin  ADHD- stable - Nothing at this time,    Nicotine vapor product user - Recommended patient start using nicotine patches 14 mg daily that she was prescribed at discharge from hospital, patient endorsed understanding      Collaboration of Care: Collaboration of Care:   Patient/Guardian was advised Release of Information must be obtained prior to any record release in order to collaborate their care with an outside provider. Patient/Guardian was advised if they have not already done so to contact the registration department to sign all necessary forms in order for [place] to release information regarding their care.   Consent:  Patient/Guardian gives verbal consent for treatment and assignment of benefits for services provided during this visit. Patient/Guardian expressed understanding and agreed to proceed.   PGY-3 Shelby Morton, MD 05/10/2023, 10:48 AM

## 2023-05-11 ENCOUNTER — Other Ambulatory Visit (HOSPITAL_COMMUNITY): Payer: BC Managed Care – PPO | Admitting: Licensed Clinical Social Worker

## 2023-05-11 ENCOUNTER — Other Ambulatory Visit (HOSPITAL_COMMUNITY): Payer: BC Managed Care – PPO

## 2023-05-11 DIAGNOSIS — Z9151 Personal history of suicidal behavior: Secondary | ICD-10-CM | POA: Diagnosis not present

## 2023-05-11 DIAGNOSIS — F314 Bipolar disorder, current episode depressed, severe, without psychotic features: Secondary | ICD-10-CM

## 2023-05-11 DIAGNOSIS — R4589 Other symptoms and signs involving emotional state: Secondary | ICD-10-CM | POA: Diagnosis not present

## 2023-05-11 DIAGNOSIS — Z7389 Other problems related to life management difficulty: Secondary | ICD-10-CM | POA: Diagnosis not present

## 2023-05-11 DIAGNOSIS — F909 Attention-deficit hyperactivity disorder, unspecified type: Secondary | ICD-10-CM | POA: Diagnosis not present

## 2023-05-11 DIAGNOSIS — F319 Bipolar disorder, unspecified: Secondary | ICD-10-CM | POA: Diagnosis not present

## 2023-05-11 DIAGNOSIS — F603 Borderline personality disorder: Secondary | ICD-10-CM

## 2023-05-12 ENCOUNTER — Other Ambulatory Visit (HOSPITAL_COMMUNITY): Payer: BC Managed Care – PPO

## 2023-05-12 ENCOUNTER — Other Ambulatory Visit (HOSPITAL_COMMUNITY): Payer: BC Managed Care – PPO | Admitting: Licensed Clinical Social Worker

## 2023-05-12 ENCOUNTER — Encounter (HOSPITAL_COMMUNITY): Payer: Self-pay

## 2023-05-12 DIAGNOSIS — R4589 Other symptoms and signs involving emotional state: Secondary | ICD-10-CM

## 2023-05-12 DIAGNOSIS — Z7389 Other problems related to life management difficulty: Secondary | ICD-10-CM | POA: Diagnosis not present

## 2023-05-12 DIAGNOSIS — Z9151 Personal history of suicidal behavior: Secondary | ICD-10-CM | POA: Diagnosis not present

## 2023-05-12 DIAGNOSIS — F319 Bipolar disorder, unspecified: Secondary | ICD-10-CM | POA: Diagnosis not present

## 2023-05-12 DIAGNOSIS — F603 Borderline personality disorder: Secondary | ICD-10-CM

## 2023-05-12 DIAGNOSIS — F314 Bipolar disorder, current episode depressed, severe, without psychotic features: Secondary | ICD-10-CM

## 2023-05-12 DIAGNOSIS — F909 Attention-deficit hyperactivity disorder, unspecified type: Secondary | ICD-10-CM | POA: Diagnosis not present

## 2023-05-12 NOTE — Therapy (Signed)
[facility] PARTIAL HOSPITALIZATION PROGRAM [address] [place], [place], [address] Phone: [phone]   Fax:  [phone]  Occupational Therapy Treatment Virtual Visit via Video Note  I connected with [name] on 05/12/23 at  8:00 AM EDT by a video enabled telemedicine application and verified that I am speaking with the correct person using two identifiers.  Location: Patient: home Provider: office   I discussed the limitations of evaluation and management by telemedicine and the availability of in person appointments. The patient expressed understanding and agreed to proceed.    The patient was advised to call back or seek an in-person evaluation if the symptoms worsen or if the condition fails to improve as anticipated.  I provided 55 minutes of non-face-to-face time during this encounter.   Patient Details  Name: Shelby Gallegos MRN: [id number] Date of Birth: 13-Feb-1981 No data recorded  Encounter Date: 05/10/2023   OT End of Session - 05/12/23 2257     Visit Number 8    Number of Visits 20    Date for OT Re-Evaluation 06/01/23    OT Start Time 1200    OT Stop Time 1255    OT Time Calculation (min) 55 min             Past Medical History:  Diagnosis Date   ADHD (attention deficit hyperactivity disorder)    Bipolar 1 disorder (HCC)    Migraine    Suicide attempt by drug overdose (HCC) 02/15/2019    Past Surgical History:  Procedure Laterality Date   NO PAST SURGERIES     TENDON REPAIR Bilateral 02/20/2019   Procedure: TENDON REPAIR BILATERAL FOREARMS;  Surgeon: [name], MD;  Location: [facility];  Service: Orthopedics;  Laterality: Bilateral;   WOUND EXPLORATION Bilateral 02/20/2019   Procedure: WOUND EXPLORATION;  Surgeon: [name], MD;  Location: [facility];  Service: Orthopedics;  Laterality: Bilateral;    There were no vitals filed for this visit.   Subjective Assessment - 05/12/23  2257     Currently in Pain? No/denies    Pain Score 0-No pain                Group Session:  S: Doing better today  O: The session centered on the neuroscientific underpinnings of motivation and its inverse relationship with procrastination. Through a review of current scientific literature, the group examined:  The function of dopamine as a critical neurotransmitter in the motivation circuitry of the brain. The physiological and psychological aspects of procrastination, including the impact of dopamine on the tendency to delay tasks. Strategies for leveraging an understanding of dopamine's role to enhance motivation, address procrastination, and foster productive habits. The objective was to provide a framework for participants to understand the neurochemical dynamics of motivation, relate these to personal experiences of procrastination, and apply evidence-based methods to improve focus and drive in both professional and personal contexts.   A: The patient demonstrates a strong grasp of the material presented on the neurobiology of motivation and its implications for procrastination. They actively participated in discussions, showing an ability to connect scientific concepts to personal experiences. The patient exhibits insight into their own behavior patterns and expresses a keen interest in applying the strategies discussed to mitigate tendencies towards procrastination. The patient's engagement level indicates a readiness to implement behavioral changes that may enhance personal productivity and reduce procrastination.  P: Continue to attend PHP OT group sessions 5x week for 4 weeks to  promote daily structure, social engagement, and opportunities to develop and utilize adaptive strategies to maximize functional performance in preparation for safe transition and integration back into school, work, and the community. Plan to address topic of tbd in next OT group  session.                   OT Education - 05/12/23 2257     Education Details Procrastination 2              OT Short Term Goals - 05/02/23 1005       OT SHORT TERM GOAL #1   Title Pt will be educated on strategies to improve psychosocial skills needed to participate fully in all daily, work, and leisure activities    Time 4    Period Weeks    Status On-going    Target Date 06/01/23      OT SHORT TERM GOAL #2   Title Pt will apply psychosocial skills and coping mechanisms to daily activities in order to function independently and reintegrate into community    Time 4    Period Weeks    Status On-going    Target Date 06/01/23      OT SHORT TERM GOAL #3   Title Pt will recall and/or apply 1-3 sleep hygiene strategies to improve BADL participation prior to reintegrating into community    Time 4    Period Weeks    Status On-going    Target Date 06/01/23      OT SHORT TERM GOAL #4   Title Pt will engage in goal setting to improve BADL/IADL participation prior to reintegrating into community    Time 4    Period Weeks    Status On-going    Target Date 06/01/23      OT SHORT TERM GOAL #5   Title Pt will create and/or implement functional BADL/IADL routine prior to reintegrating into community    Time 4    Period Weeks    Status On-going    Target Date 06/01/23                      Plan - 05/12/23 2257     Psychosocial Skills Coping Strategies;Habits;Routines and Behaviors;Interpersonal Interaction             Patient will benefit from skilled therapeutic intervention in order to improve the following deficits and impairments:       Psychosocial Skills: Coping Strategies, Habits, Routines and Behaviors, Interpersonal Interaction   Visit Diagnosis: Difficulty coping    Problem List Patient Active Problem List   Diagnosis Date Noted   Panic attacks 05/10/2023   Bipolar disorder, current episode depressed, severe (HCC)  04/15/2023   Hypothyroidism 03/30/2023   Prediabetes 03/30/2023   Nicotine vapor product user 03/30/2023   Suicide attempt [facility])    Intentional acetaminophen overdose (HCC)    History of suicide attempt 04/01/2017   Borderline personality disorder (HCC) 02/16/2017   Bipolar affective disorder, depressed, severe (HCC) 08/21/2015   ADHD (attention deficit hyperactivity disorder) 08/21/2015   Headache, migraine 09/28/2014   Abnormal ECG 11/26/2009    Ted Mcalpine, OT 05/12/2023, 10:58 PM Kerrin Champagne, OT  [facility] HOSPITALIZATION PROGRAM [address] [place], [place], [address] Phone: [phone]   Fax:  [phone]  Name: Shelby Gallegos MRN: [id number] Date of Birth: Dec 19, 1980

## 2023-05-13 ENCOUNTER — Other Ambulatory Visit (HOSPITAL_COMMUNITY): Payer: BC Managed Care – PPO

## 2023-05-13 ENCOUNTER — Encounter (HOSPITAL_COMMUNITY): Payer: Self-pay

## 2023-05-13 NOTE — Therapy (Signed)
[facility] PARTIAL HOSPITALIZATION PROGRAM [address] [place], [place], [address] Phone: [phone]   Fax:  [phone]  Occupational Therapy Treatment Virtual Visit via Video Note  I connected with Shelby Gallegos on 05/13/23 at  8:00 AM EDT by a video enabled telemedicine application and verified that I am speaking with the correct person using two identifiers.  Location: Patient: home Provider: office   I discussed the limitations of evaluation and management by telemedicine and the availability of in person appointments. The patient expressed understanding and agreed to proceed.    The patient was advised to call back or seek an in-person evaluation if the symptoms worsen or if the condition fails to improve as anticipated.  I provided 55 minutes of non-face-to-face time during this encounter.   Patient Details  Name: Shelby Gallegos MRN: [id number] Date of Birth: 1981-02-15 No data recorded  Encounter Date: 05/11/2023   OT End of Session - 05/12/23 2359     Visit Number 9    Number of Visits 20    Date for OT Re-Evaluation 06/01/23    OT Start Time 1200    OT Stop Time 1255    OT Time Calculation (min) 55 min             Past Medical History:  Diagnosis Date   ADHD (attention deficit hyperactivity disorder)    Bipolar 1 disorder (HCC)    Migraine    Suicide attempt by drug overdose (HCC) 02/15/2019    Past Surgical History:  Procedure Laterality Date   NO PAST SURGERIES     TENDON REPAIR Bilateral 02/20/2019   Procedure: TENDON REPAIR BILATERAL FOREARMS;  Surgeon: [name], MD;  Location: [facility];  Service: Orthopedics;  Laterality: Bilateral;   WOUND EXPLORATION Bilateral 02/20/2019   Procedure: WOUND EXPLORATION;  Surgeon: [name], MD;  Location: [facility];  Service: Orthopedics;  Laterality: Bilateral;    There were no vitals filed for this visit.   Subjective Assessment - 05/12/23  2358     Currently in Pain? No/denies    Pain Score 0-No pain                 Group Session:  S: Feeling better today.  O: The session centered on the neuroscientific underpinnings of motivation and its inverse relationship with procrastination. Through a review of current scientific literature, the group examined:  The function of dopamine as a critical neurotransmitter in the motivation circuitry of the brain. The physiological and psychological aspects of procrastination, including the impact of dopamine on the tendency to delay tasks. Strategies for leveraging an understanding of dopamine's role to enhance motivation, address procrastination, and foster productive habits. The objective was to provide a framework for participants to understand the neurochemical dynamics of motivation, relate these to personal experiences of procrastination, and apply evidence-based methods to improve focus and drive in both professional and personal contexts.   A:  The patient demonstrates a strong grasp of the material presented on the neurobiology of motivation and its implications for procrastination. They actively participated in discussions, showing an ability to connect scientific concepts to personal experiences. The patient exhibits insight into their own behavior patterns and expresses a keen interest in applying the strategies discussed to mitigate tendencies towards procrastination. The patient's engagement level indicates a readiness to implement behavioral changes that may enhance personal productivity and reduce procrastination.    P: Continue to attend PHP OT group sessions 5x week  for 4 weeks to promote daily structure, social engagement, and opportunities to develop and utilize adaptive strategies to maximize functional performance in preparation for safe transition and integration back into school, work, and the community. Plan to address topic of tbd in next OT group  session.                  OT Education - 05/12/23 2359     Education Details Procrastination              OT Short Term Goals - 05/02/23 1005       OT SHORT TERM GOAL #1   Title Pt will be educated on strategies to improve psychosocial skills needed to participate fully in all daily, work, and leisure activities    Time 4    Period Weeks    Status On-going    Target Date 06/01/23      OT SHORT TERM GOAL #2   Title Pt will apply psychosocial skills and coping mechanisms to daily activities in order to function independently and reintegrate into community    Time 4    Period Weeks    Status On-going    Target Date 06/01/23      OT SHORT TERM GOAL #3   Title Pt will recall and/or apply 1-3 sleep hygiene strategies to improve BADL participation prior to reintegrating into community    Time 4    Period Weeks    Status On-going    Target Date 06/01/23      OT SHORT TERM GOAL #4   Title Pt will engage in goal setting to improve BADL/IADL participation prior to reintegrating into community    Time 4    Period Weeks    Status On-going    Target Date 06/01/23      OT SHORT TERM GOAL #5   Title Pt will create and/or implement functional BADL/IADL routine prior to reintegrating into community    Time 4    Period Weeks    Status On-going    Target Date 06/01/23                      Plan - 05/12/23 2359     Psychosocial Skills Coping Strategies;Habits;Routines and Behaviors;Interpersonal Interaction             Patient will benefit from skilled therapeutic intervention in order to improve the following deficits and impairments:       Psychosocial Skills: Coping Strategies, Habits, Routines and Behaviors, Interpersonal Interaction   Visit Diagnosis: Difficulty coping    Problem List Patient Active Problem List   Diagnosis Date Noted   Panic attacks 05/10/2023   Bipolar disorder, current episode depressed, severe (HCC) 04/15/2023    Hypothyroidism 03/30/2023   Prediabetes 03/30/2023   Nicotine vapor product user 03/30/2023   Suicide attempt [facility])    Intentional acetaminophen overdose (HCC)    History of suicide attempt 04/01/2017   Borderline personality disorder (HCC) 02/16/2017   Bipolar affective disorder, depressed, severe (HCC) 08/21/2015   ADHD (attention deficit hyperactivity disorder) 08/21/2015   Headache, migraine 09/28/2014   Abnormal ECG 11/26/2009    Ted Mcalpine, OT 05/13/2023, 12:00 AM  Kerrin Champagne, OT   [facility] HOSPITALIZATION PROGRAM [address] [place], [place], [address] Phone: [phone]   Fax:  [phone]  Name: Shelby Gallegos MRN: [id number] Date of Birth: Apr 04, 1981

## 2023-05-13 NOTE — Therapy (Signed)
[facility] PARTIAL HOSPITALIZATION PROGRAM [address] [place], [place], [address] Phone: [phone]   Fax:  [phone]  Occupational Therapy Treatment Virtual Visit via Video Note  I connected with Shelby Gallegos on 05/13/23 at  8:00 AM EDT by a video enabled telemedicine application and verified that I am speaking with the correct person using two identifiers.  Location: Patient: home Provider: office   I discussed the limitations of evaluation and management by telemedicine and the availability of in person appointments. The patient expressed understanding and agreed to proceed.    The patient was advised to call back or seek an in-person evaluation if the symptoms worsen or if the condition fails to improve as anticipated.  I provided 55 minutes of non-face-to-face time during this encounter.   Patient Details  Name: Shelby Gallegos MRN: [id number] Date of Birth: 1980-12-01 No data recorded  Encounter Date: 05/12/2023   OT End of Session - 05/13/23 0733     Visit Number 10    Number of Visits 20    Date for OT Re-Evaluation 06/01/23    OT Start Time 1200    OT Stop Time 1255    OT Time Calculation (min) 55 min             Past Medical History:  Diagnosis Date   ADHD (attention deficit hyperactivity disorder)    Bipolar 1 disorder (HCC)    Migraine    Suicide attempt by drug overdose (HCC) 02/15/2019    Past Surgical History:  Procedure Laterality Date   NO PAST SURGERIES     TENDON REPAIR Bilateral 02/20/2019   Procedure: TENDON REPAIR BILATERAL FOREARMS;  Surgeon: [name], MD;  Location: [facility];  Service: Orthopedics;  Laterality: Bilateral;   WOUND EXPLORATION Bilateral 02/20/2019   Procedure: WOUND EXPLORATION;  Surgeon: [name], MD;  Location: [facility];  Service: Orthopedics;  Laterality: Bilateral;    There were no vitals filed for this visit.   Subjective Assessment - 05/13/23  0732     Currently in Pain? No/denies    Pain Score 0-No pain                 Group Session:  S: Doing good today.   O: In this communication group therapy session, facilitated by an occupational therapist, participants explored several key subtopics aimed at enhancing their interpersonal skills. The session began with a discussion on the use of "I" and "AND" statements, emphasizing personal responsibility and constructive language in expressing feelings and needs. Participants then practiced active listening techniques to improve their ability to fully understand and respond to others. The group also delved into assertive communication, learning how to express themselves confidently and respectfully. Emotional regulation skills were addressed, providing strategies for managing emotions during interactions. Social skills training included role-playing scenarios to build confidence in various social contexts. Feedback and reflection were integral parts of the session, with participants offering and receiving constructive feedback to foster personal growth. The group identified common barriers to effective communication, such as anxiety, fear of judgment, and past negative experiences, and discussed strategies to overcome these challenges, including mindfulness practices and building a supportive network.   A: The patient actively participated in the group therapy session, demonstrating a high level of engagement and enthusiasm. They contributed to discussions on "I" and "AND" statements by sharing personal examples and insights. During the active listening exercises, the patient was attentive and provided thoughtful feedback to peers. They  effectively practiced assertive communication techniques and showed a clear understanding of emotional regulation strategies. In social skills role-playing, the patient was confident and responsive, indicating a good grasp of the concepts. The patient also  engaged in the feedback and reflection segment, offering constructive comments and showing receptivity to feedback from others. Their proactive approach and willingness to explore personal barriers to communication suggest significant progress and motivation to improve interpersonal skills.    P: Continue to attend PHP OT group sessions 5x week for 4 weeks to promote daily structure, social engagement, and opportunities to develop and utilize adaptive strategies to maximize functional performance in preparation for safe transition and integration back into school, work, and the community. Plan to address topic of pt 2 in next OT group session.                  OT Education - 05/13/23 0732     Education Details Communication 1              OT Short Term Goals - 05/02/23 1005       OT SHORT TERM GOAL #1   Title Pt will be educated on strategies to improve psychosocial skills needed to participate fully in all daily, work, and leisure activities    Time 4    Period Weeks    Status On-going    Target Date 06/01/23      OT SHORT TERM GOAL #2   Title Pt will apply psychosocial skills and coping mechanisms to daily activities in order to function independently and reintegrate into community    Time 4    Period Weeks    Status On-going    Target Date 06/01/23      OT SHORT TERM GOAL #3   Title Pt will recall and/or apply 1-3 sleep hygiene strategies to improve BADL participation prior to reintegrating into community    Time 4    Period Weeks    Status On-going    Target Date 06/01/23      OT SHORT TERM GOAL #4   Title Pt will engage in goal setting to improve BADL/IADL participation prior to reintegrating into community    Time 4    Period Weeks    Status On-going    Target Date 06/01/23      OT SHORT TERM GOAL #5   Title Pt will create and/or implement functional BADL/IADL routine prior to reintegrating into community    Time 4    Period Weeks    Status  On-going    Target Date 06/01/23                      Plan - 05/13/23 0733     Psychosocial Skills Coping Strategies;Habits;Routines and Behaviors;Interpersonal Interaction             Patient will benefit from skilled therapeutic intervention in order to improve the following deficits and impairments:       Psychosocial Skills: Coping Strategies, Habits, Routines and Behaviors, Interpersonal Interaction   Visit Diagnosis: Difficulty coping    Problem List Patient Active Problem List   Diagnosis Date Noted   Panic attacks 05/10/2023   Bipolar disorder, current episode depressed, severe (HCC) 04/15/2023   Hypothyroidism 03/30/2023   Prediabetes 03/30/2023   Nicotine vapor product user 03/30/2023   Suicide attempt [facility])    Intentional acetaminophen overdose (HCC)    History of suicide attempt 04/01/2017   Borderline personality disorder (HCC) 02/16/2017  Bipolar affective disorder, depressed, severe (HCC) 08/21/2015   ADHD (attention deficit hyperactivity disorder) 08/21/2015   Headache, migraine 09/28/2014   Abnormal ECG 11/26/2009    Ted Mcalpine, OT 05/13/2023, 7:33 AM Kerrin Champagne, OT  [facility] PROGRAM [address] [place], [place], [address] Phone: [phone]   Fax:  [phone]  Name: Shelby Gallegos MRN: [id number] Date of Birth: Dec 28, 1980

## 2023-05-16 ENCOUNTER — Other Ambulatory Visit (HOSPITAL_COMMUNITY): Payer: BC Managed Care – PPO

## 2023-05-16 ENCOUNTER — Other Ambulatory Visit (HOSPITAL_COMMUNITY): Payer: BC Managed Care – PPO | Admitting: Licensed Clinical Social Worker

## 2023-05-16 DIAGNOSIS — R4589 Other symptoms and signs involving emotional state: Secondary | ICD-10-CM | POA: Diagnosis not present

## 2023-05-16 DIAGNOSIS — F319 Bipolar disorder, unspecified: Secondary | ICD-10-CM | POA: Diagnosis not present

## 2023-05-16 DIAGNOSIS — Z9151 Personal history of suicidal behavior: Secondary | ICD-10-CM | POA: Diagnosis not present

## 2023-05-16 DIAGNOSIS — Z7389 Other problems related to life management difficulty: Secondary | ICD-10-CM | POA: Diagnosis not present

## 2023-05-16 DIAGNOSIS — F314 Bipolar disorder, current episode depressed, severe, without psychotic features: Secondary | ICD-10-CM

## 2023-05-16 DIAGNOSIS — F603 Borderline personality disorder: Secondary | ICD-10-CM

## 2023-05-16 DIAGNOSIS — F909 Attention-deficit hyperactivity disorder, unspecified type: Secondary | ICD-10-CM | POA: Diagnosis not present

## 2023-05-16 NOTE — Progress Notes (Signed)
Spoke with patient via Teams video call, used 2 identifiers to correctly identify patient. States that groups are good, she is still not taking her Gabapentin but will discuss it with the psychiatrist. Her roommate didn't show up for court on Friday so now she has a warrant out for her arrest. She states the situation is still stressful but she is coping with it well. Her anxiety has become a lot less since starting groups. On scale 1-10 as 10 being worst she rates depression at 5 and anxiety at 4. Denies SI/HI or AV hallucinations. No issues or complaints. No side effects from medications.

## 2023-05-16 NOTE — Progress Notes (Signed)
Virtual Visit via Video Note  I connected with Shelby Gallegos on 05/16/23 at  9:00 AM EDT by a video enabled telemedicine application and verified that I am speaking with the correct person using two identifiers.  Location: Patient: Home Provider: Office   I discussed the limitations of evaluation and management by telemedicine and the availability of in person appointments. The patient expressed understanding and agreed to proceed.     I discussed the assessment and treatment plan with the patient. The patient was provided an opportunity to ask questions and all were answered. The patient agreed with the plan and demonstrated an understanding of the instructions.   The patient was advised to call back or seek an in-person evaluation if the symptoms worsen or if the condition fails to improve as anticipated.     [name], MD  [facility] MD/PA/NP OP Progress Note  05/16/2023 11:30 AM Shelby Gallegos  MRN:  [id number]  Chief Complaint:  Chief Complaint  Patient presents with   Depression   Anxiety   HPI:   Shelby Gallegos is a 42 year old female with a past psychiatric history of bipolar 1 disorder, ADHD, and prior suicide attempts via OD.     Patient reports that she is doing well. Patient reports that her weekend went well, she saw her family. Patient reports that she went to court, unfortunately it resulted in her roommate getting a warrant for her arrest, b/c the roommate did not show up. Patient endorses some guilt about this, but has talked to group who helped her see that it is not her fault that the patient did not present to court.  Patient reports that she is sleeping really well and is not having to call [phone] nightly. Patient reports that her appetite is not great, but she is trying to make sure she eats. Patient reports that her anxiety is still high, it was heightened in court, but she was able to use her coping skills and did not have a panic attack. Patient denies Si today.  Patient reports she had some fleeting SI on Sat night for no reason, but they had no plan or intent and she did not need to call [phone]. Patient reports low energy, but she is able to motivate herself enough to be productive. She is slowing getting back to making sure her hygiene is good and house is clean. Patient is still doing her legos and playing with the dog.   Patient reports that she has started using the nicotine patches, she has been using since Thursday. Patient reports that she is still vaping a bit, but she has realized that the vape may be necessity for oral fixation.    Visit Diagnosis:    ICD-10-CM   1. Borderline personality disorder (HCC)  F60.3     2. Bipolar disorder, current episode depressed, severe, without psychotic features (HCC)  F31.4       Past Psychiatric History: INPT: 4x, ( SI, SI, SA in 2020, and 2024 ) SA in 2020 OPT: Dr. Evelene Croon for 23 years Therapy: yes in the past and has one currently, [name] @ [facility] Medications: Prozac as a teen had SI, cymbalta, effexor, Abilify, depakote (did not do well), caplyta (symptomatic hypotension), trileptal, Geodon,  Past Medical History:  Past Medical History:  Diagnosis Date   ADHD (attention deficit hyperactivity disorder)    Bipolar 1 disorder (HCC)    Migraine    Suicide attempt by drug overdose (HCC) 02/15/2019  Past Surgical History:  Procedure Laterality Date   NO PAST SURGERIES     TENDON REPAIR Bilateral 02/20/2019   Procedure: TENDON REPAIR BILATERAL FOREARMS;  Surgeon: [name], MD;  Location: [facility];  Service: Orthopedics;  Laterality: Bilateral;   WOUND EXPLORATION Bilateral 02/20/2019   Procedure: WOUND EXPLORATION;  Surgeon: [name], MD;  Location: [facility];  Service: Orthopedics;  Laterality: Bilateral;    Family Psychiatric History: P Uncle: Bipolar A lot of fmaily with Etoh use d/o     Family History:  Family History  Problem Relation  Age of Onset   Atrial fibrillation Mother    Bipolar disorder Paternal Uncle    Alcohol abuse Paternal Uncle    Depression Paternal Uncle    OCD Paternal Uncle     Social History:  Social History   Socioeconomic History   Marital status: Single    Spouse name: Not on file   Number of children: 0   Years of education: Not on file   Highest education level: Bachelor's degree (e.g., BA, AB, BS)  Occupational History   Not on file  Tobacco Use   Smoking status: Every Day    Types: Cigarettes, E-cigarettes   Smokeless tobacco: Never   Tobacco comments:    Reports she vaps for past year.   Vaping Use   Vaping Use: Every day   Start date: 11/29/2021   Substances: Nicotine, Flavoring  Substance and Sexual Activity   Alcohol use: Not Currently    Comment: Reported last use on 04/14/23 when she stated trying to overdose, none since.   Drug use: No   Sexual activity: Not Currently    Birth control/protection: None  Other Topics Concern   Not on file  Social History Narrative   Not on file   Social Determinants of Health   Financial Resource Strain: Low Risk  (03/30/2023)   Overall Financial Resource Strain (CARDIA)    Difficulty of Paying Living Expenses: Not very hard  Food Insecurity: No Food Insecurity (04/15/2023)   Hunger Vital Sign    Worried About Running Out of Food in the Last Year: Never true    Ran Out of Food in the Last Year: Never true  Transportation Needs: No Transportation Needs (04/15/2023)   PRAPARE - Administrator, Civil Service (Medical): No    Lack of Transportation (Non-Medical): No  Physical Activity: Insufficiently Active (03/30/2023)   Exercise Vital Sign    Days of Exercise per Week: 2 days    Minutes of Exercise per Session: 20 min  Stress: Stress Concern Present (03/30/2023)   [employer] of Occupational Health - Occupational Stress Questionnaire    Feeling of Stress : Rather much  Social Connections: Moderately Integrated  (03/30/2023)   Social Connection and Isolation Panel [NHANES]    Frequency of Communication with Friends and Family: More than three times a week    Frequency of Social Gatherings with Friends and Family: More than three times a week    Attends Religious Services: More than 4 times per year    Active Member of [employer] or Organizations: Yes    Attends Banker Meetings: More than 4 times per year    Marital Status: Never married    Allergies:  Allergies  Allergen Reactions   Onion     Triggers migraines    Metabolic Disorder Labs: Lab Results  Component Value Date   HGBA1C 5.8 (H) 04/14/2023   MPG 119.76  04/14/2023   MPG 114.02 02/19/2019   No results found for: "PROLACTIN" Lab Results  Component Value Date   CHOL 224 (H) 04/14/2023   TRIG 142 04/14/2023   HDL 55 04/14/2023   CHOLHDL 4.1 04/14/2023   VLDL 28 04/14/2023   LDLCALC 141 (H) 04/14/2023   LDLCALC 130 (H) 03/31/2023   Lab Results  Component Value Date   TSH 3.100 04/14/2023   TSH 2.750 03/31/2023    Therapeutic Level Labs: No results found for: "LITHIUM" No results found for: "VALPROATE" No results found for: "CBMZ"  Current Medications: Current Outpatient Medications  Medication Sig Dispense Refill   ALPRAZolam (XANAX) 0.5 MG tablet Take 0.5 mg by mouth daily as needed for anxiety.     amphetamine-dextroamphetamine (ADDERALL) 20 MG tablet Take 20 mg by mouth in the morning, at noon, and at bedtime.     cariprazine (VRAYLAR) 3 MG capsule Take 1 capsule (3 mg total) by mouth every evening. 30 capsule 0   cyanocobalamin (VITAMIN B12) 1000 MCG/ML injection Inject 1 mL (1,000 mcg total) into the muscle once for 1 dose. Inject 1 mL into the muscle once a week for 3 weeks and then once monthly 4 mL 5   Erenumab-aooe (AIMOVIG) 140 MG/ML SOAJ Inject 140 mg into the skin every 30 (thirty) days. 1.12 mL 11   hydrOXYzine (ATARAX) 10 MG tablet Take 1 tablet (10 mg total) by mouth daily. 90 tablet 0    lamoTRIgine (LAMICTAL) 200 MG tablet Take 400 mg by mouth at bedtime.     prazosin (MINIPRESS) 1 MG capsule Take 1 capsule (1 mg total) by mouth at bedtime. 30 capsule 0   Rimegepant Sulfate (NURTEC) 75 MG TBDP Take 75 mg by mouth as needed (For migraines).     nicotine (NICODERM CQ - DOSED IN MG/24 HOURS) 14 mg/24hr patch Place 1 patch (14 mg total) onto the skin daily. (Patient not taking: Reported on 04/29/2023) 28 patch 0   sertraline (ZOLOFT) 25 MG tablet Take 0.5 tablets (12.5 mg total) by mouth at bedtime. (Patient not taking: Reported on 05/09/2023) 15 tablet 0   SUMAtriptan 6 MG/0.5ML SOAJ Inject 6 mg into the skin as needed. (Patient taking differently: Inject 6 mg into the skin every 2 (two) hours as needed (For migraines or headaches).) 15 mL 3   Ubrogepant (UBRELVY) 100 MG TABS Take 1 tablet (100 mg total) by mouth as needed. (Patient not taking: Reported on 05/02/2023) 10 tablet 3   No current facility-administered medications for this visit.     Psychiatric Specialty Exam: Review of Systems  Psychiatric/Behavioral:  Positive for dysphoric mood. Negative for hallucinations, sleep disturbance and suicidal ideas.     There were no vitals taken for this visit.There is no height or weight on file to calculate BMI.  General Appearance: Casual  Eye Contact:  Good  Speech:  Clear and Coherent  Volume:  Normal  Mood:  Euthymic  Affect:  Appropriate  Thought Process:  Coherent  Orientation:  Full (Time, Place, and Person)  Thought Content: Logical   Suicidal Thoughts:  No  Homicidal Thoughts:  No  Memory:  Immediate;   Good Recent;   Good  Judgement:  Good  Insight:  Fair  Psychomotor Activity:  Normal  Concentration:  Concentration: Good  Recall:  NA  Fund of Knowledge: Good  Language: Good  Akathisia:  NA  Handed:  \  AIMS (if indicated): not done  Assets:  Communication Skills Desire for Improvement Housing Resilience Social  Support Vocational/Educational  ADL's:   Intact  Cognition: WNL  Sleep:  Good   Screenings: AIMS    Flowsheet Row [facility]  AIMS Total Score 0      GAD-7    Flowsheet Row Counselor from [facility]29[facility]20[facility]  Total GAD-7 Score 19      PHQ2-9    Flowsheet Row Counselor from 04/29/2023 [facility] Counselor from 04/26/2023 [facility] Office Visit from 03/30/2023 in [facility] Office Visit from 01/14/2022 in [facility] Office Visit from 01/13/2021 in [facility]  PHQ-2 Total Score 3 4 3 2 3   PHQ-9 Total Score 16 19 5 6 5       Flowsheet Row Counselor from 04/29/2023 in [facility] Counselor from 04/26/2023 in [facility]  C-SSRS RISK CATEGORY No Risk High Risk Low Risk        Assessment and Plan: Low motivation continues to be some of an issue for patient, but she is trying to increase her ADLs beyond her hobbies of distraction. Her anxiety is improving as well, will increase Zoloft to 25mg  and monitor. No other medication adjustments at this time. Patient insight also appears to be improving.  Borderline personality disorder Bipolar disorder, current episode depressed GAD - Increase Zoloft to 25mg  at bedtime,  we will continue to monitor for symptoms of hypomania - Continue Lamictal 400 mg nightly - Continue Vraylar 3 mg nightly - Continue Xanax 0.5 mg daily as needed - Continue hydroxyzine 10 mg 3 times daily  - Continue prazosin to 1mg  QHS - Hold gabapentin   ADHD- stable - CBT learned skills   Nicotine vapor product user - Continue NRT patch 14mg , patient will also work on  oral fixation   Collaboration of Care: Collaboration of Care:   Patient/Guardian was advised Release of Information must be obtained prior to any record release in order to collaborate their care with an outside provider. Patient/Guardian was advised if they have not already done so to contact the registration department to sign all necessary forms in order for [place] to release information regarding their care.   Consent: Patient/Guardian gives verbal consent for treatment and assignment of benefits for services provided during this visit. Patient/Guardian expressed understanding and agreed to proceed.   PGY-3 [name], MD 05/16/2023, 11:30 AM

## 2023-05-17 ENCOUNTER — Other Ambulatory Visit (HOSPITAL_COMMUNITY): Payer: BC Managed Care – PPO

## 2023-05-17 ENCOUNTER — Other Ambulatory Visit (HOSPITAL_COMMUNITY): Payer: BC Managed Care – PPO | Admitting: Licensed Clinical Social Worker

## 2023-05-17 ENCOUNTER — Encounter (HOSPITAL_COMMUNITY): Payer: Self-pay

## 2023-05-17 DIAGNOSIS — F319 Bipolar disorder, unspecified: Secondary | ICD-10-CM | POA: Diagnosis not present

## 2023-05-17 DIAGNOSIS — R4589 Other symptoms and signs involving emotional state: Secondary | ICD-10-CM

## 2023-05-17 DIAGNOSIS — Z7389 Other problems related to life management difficulty: Secondary | ICD-10-CM | POA: Diagnosis not present

## 2023-05-17 DIAGNOSIS — F314 Bipolar disorder, current episode depressed, severe, without psychotic features: Secondary | ICD-10-CM

## 2023-05-17 DIAGNOSIS — Z9151 Personal history of suicidal behavior: Secondary | ICD-10-CM | POA: Diagnosis not present

## 2023-05-17 DIAGNOSIS — F603 Borderline personality disorder: Secondary | ICD-10-CM

## 2023-05-17 DIAGNOSIS — F909 Attention-deficit hyperactivity disorder, unspecified type: Secondary | ICD-10-CM | POA: Diagnosis not present

## 2023-05-17 NOTE — Therapy (Signed)
[facility] PARTIAL HOSPITALIZATION PROGRAM [address] [place], [place], [address] Phone: [phone]   Fax:  [phone]  Occupational Therapy Treatment Virtual Visit via Video Note  I connected with [name] on 05/17/23 at  8:00 AM EDT by a video enabled telemedicine application and verified that I am speaking with the correct person using two identifiers.  Location: Patient: home Provider: office   I discussed the limitations of evaluation and management by telemedicine and the availability of in person appointments. The patient expressed understanding and agreed to proceed.    The patient was advised to call back or seek an in-person evaluation if the symptoms worsen or if the condition fails to improve as anticipated.  I provided 55 minutes of non-face-to-face time during this encounter.   Patient Details  Name: Shelby Gallegos MRN: [id number] Date of Birth: 1981-06-27 No data recorded  Encounter Date: 05/16/2023   OT End of Session - 05/17/23 1117     Visit Number 11    Number of Visits 20    Date for OT Re-Evaluation 06/01/23    OT Start Time 1200    OT Stop Time 1255    OT Time Calculation (min) 55 min             Past Medical History:  Diagnosis Date   ADHD (attention deficit hyperactivity disorder)    Bipolar 1 disorder (HCC)    Migraine    Suicide attempt by drug overdose (HCC) 02/15/2019    Past Surgical History:  Procedure Laterality Date   NO PAST SURGERIES     TENDON REPAIR Bilateral 02/20/2019   Procedure: TENDON REPAIR BILATERAL FOREARMS;  Surgeon: [name], MD;  Location: [facility];  Service: Orthopedics;  Laterality: Bilateral;   WOUND EXPLORATION Bilateral 02/20/2019   Procedure: WOUND EXPLORATION;  Surgeon: [name], MD;  Location: [facility];  Service: Orthopedics;  Laterality: Bilateral;    There were no vitals filed for this visit.   Subjective Assessment - 05/17/23  1117     Currently in Pain? No/denies    Pain Score 0-No pain                  Group Session:  S: Doing better today.   O: In this communication group therapy session, facilitated by an occupational therapist, participants explored several key subtopics aimed at enhancing their interpersonal skills. The session began with a discussion on the use of "I" and "AND" statements, emphasizing personal responsibility and constructive language in expressing feelings and needs. Participants then practiced active listening techniques to improve their ability to fully understand and respond to others. The group also delved into assertive communication, learning how to express themselves confidently and respectfully. Emotional regulation skills were addressed, providing strategies for managing emotions during interactions. Social skills training included role-playing scenarios to build confidence in various social contexts. Feedback and reflection were integral parts of the session, with participants offering and receiving constructive feedback to foster personal growth. The group identified common barriers to effective communication, such as anxiety, fear of judgment, and past negative experiences, and discussed strategies to overcome these challenges, including mindfulness practices and building a supportive network.   A: The patient actively participated in the group therapy session, demonstrating a high level of engagement and enthusiasm. They contributed to discussions on "I" and "AND" statements by sharing personal examples and insights. During the active listening exercises, the patient was attentive and provided thoughtful feedback to peers.  They effectively practiced assertive communication techniques and showed a clear understanding of emotional regulation strategies. In social skills role-playing, the patient was confident and responsive, indicating a good grasp of the concepts. The patient also  engaged in the feedback and reflection segment, offering constructive comments and showing receptivity to feedback from others. Their proactive approach and willingness to explore personal barriers to communication suggest significant progress and motivation to improve interpersonal skills.   P: Continue to attend PHP OT group sessions 5x week for 4 weeks to promote daily structure, social engagement, and opportunities to develop and utilize adaptive strategies to maximize functional performance in preparation for safe transition and integration back into school, work, and the community. Plan to address topic of tbd in next OT group session.                 OT Education - 05/17/23 1117     Education Details Communication              OT Short Term Goals - 05/02/23 1005       OT SHORT TERM GOAL #1   Title Pt will be educated on strategies to improve psychosocial skills needed to participate fully in all daily, work, and leisure activities    Time 4    Period Weeks    Status On-going    Target Date 06/01/23      OT SHORT TERM GOAL #2   Title Pt will apply psychosocial skills and coping mechanisms to daily activities in order to function independently and reintegrate into community    Time 4    Period Weeks    Status On-going    Target Date 06/01/23      OT SHORT TERM GOAL #3   Title Pt will recall and/or apply 1-3 sleep hygiene strategies to improve BADL participation prior to reintegrating into community    Time 4    Period Weeks    Status On-going    Target Date 06/01/23      OT SHORT TERM GOAL #4   Title Pt will engage in goal setting to improve BADL/IADL participation prior to reintegrating into community    Time 4    Period Weeks    Status On-going    Target Date 06/01/23      OT SHORT TERM GOAL #5   Title Pt will create and/or implement functional BADL/IADL routine prior to reintegrating into community    Time 4    Period Weeks    Status On-going     Target Date 06/01/23                      Plan - 05/17/23 1117     Psychosocial Skills Coping Strategies;Habits;Routines and Behaviors;Interpersonal Interaction             Patient will benefit from skilled therapeutic intervention in order to improve the following deficits and impairments:       Psychosocial Skills: Coping Strategies, Habits, Routines and Behaviors, Interpersonal Interaction   Visit Diagnosis: Difficulty coping    Problem List Patient Active Problem List   Diagnosis Date Noted   Panic attacks 05/10/2023   Bipolar disorder, current episode depressed, severe (HCC) 04/15/2023   Hypothyroidism 03/30/2023   Prediabetes 03/30/2023   Nicotine vapor product user 03/30/2023   Suicide attempt [facility])    Intentional acetaminophen overdose (HCC)    History of suicide attempt 04/01/2017   Borderline personality disorder (HCC) 02/16/2017   Bipolar affective disorder,  depressed, severe (HCC) 08/21/2015   ADHD (attention deficit hyperactivity disorder) 08/21/2015   Headache, migraine 09/28/2014   Abnormal ECG 11/26/2009    Ted Mcalpine, OT 05/17/2023, 11:18 AM  Kerrin Champagne, OT   [facility] PROGRAM [address] [place], [place], [address] Phone: [phone]   Fax:  [phone]  Name: Ellice Pellow MRN: [id number] Date of Birth: 28-Jan-1981

## 2023-05-18 ENCOUNTER — Other Ambulatory Visit (HOSPITAL_COMMUNITY): Payer: BC Managed Care – PPO | Admitting: Professional

## 2023-05-18 ENCOUNTER — Other Ambulatory Visit (HOSPITAL_COMMUNITY): Payer: BC Managed Care – PPO

## 2023-05-18 DIAGNOSIS — R4589 Other symptoms and signs involving emotional state: Secondary | ICD-10-CM

## 2023-05-18 DIAGNOSIS — F319 Bipolar disorder, unspecified: Secondary | ICD-10-CM | POA: Diagnosis not present

## 2023-05-18 DIAGNOSIS — F909 Attention-deficit hyperactivity disorder, unspecified type: Secondary | ICD-10-CM | POA: Diagnosis not present

## 2023-05-18 DIAGNOSIS — Z7389 Other problems related to life management difficulty: Secondary | ICD-10-CM | POA: Diagnosis not present

## 2023-05-18 DIAGNOSIS — F314 Bipolar disorder, current episode depressed, severe, without psychotic features: Secondary | ICD-10-CM

## 2023-05-18 DIAGNOSIS — Z9151 Personal history of suicidal behavior: Secondary | ICD-10-CM | POA: Diagnosis not present

## 2023-05-18 NOTE — Psych (Signed)
Virtual Visit via Video Note  I connected with Shelby Gallegos on 05/18/23 at  9:00 AM EDT by a video enabled telemedicine application and verified that I am speaking with the correct person using two identifiers.  Location: Patient: patient home Provider: clinical home office   I discussed the limitations of evaluation and management by telemedicine and the availability of in person appointments. The patient expressed understanding and agreed to proceed.  I discussed the assessment and treatment plan with the patient. The patient was provided an opportunity to ask questions and all were answered. The patient agreed with the plan and demonstrated an understanding of the instructions.   The patient was advised to call back or seek an in-person evaluation if the symptoms worsen or if the condition fails to improve as anticipated.  Pt was provided 240 minutes of non-face-to-face time during this encounter.   Shelby Gallegos, [facility]   [facility] PHP THERAPIST PROGRESS NOTE  Sabirah Pippert [id number]  Session Time: 9:00 - 10:00  Participation Level: Active  Behavioral Response: CasualAlertDepressed  Type of Therapy: Group Therapy  Treatment Goals addressed: Coping  Progress Towards Goals: Initial  Interventions: CBT, DBT, Supportive, and Reframing  Summary: Shelby Gallegos is a 42 y.o. female who presents with depression and mood symptoms.  Clinician led check-in regarding current stressors and situation, and review of patient completed daily inventory. Clinician utilized active listening and empathetic response and validated patient emotions. Clinician facilitated processing group on pertinent issues.?    Therapist Response: Patient arrived within time allowed. Patient rates her mood at a 7.5 on a scale of 1-10 with 10 being best. Pt states she feels "good." Pt states she slept 4 hours off/on, with nightmares and flashbacks, and ate 2x yesterday. Pt shares she had a good afternoon. She spent  time cleaning the house and talking to her friend and aunt. She reports her roommate is now in custody and she is awaiting the court date. Patient able to process. Patient engaged in discussion.       Session Time: 10:00 am - 11:00 am   Participation Level: Active   Behavioral Response: CasualAlertAnxious and Depressed   Type of Therapy: Group Therapy   Treatment Goals addressed: Coping   Progress Towards Goals: Progressing   Interventions: CBT, DBT, Solution Focused, Strength-based, Supportive, and Reframing   Therapist Response: Cln introduced topics of self-forgiveness and positive affirmations. Cln discussed how positive affirmations can help change the way we view self, and how to implement self-affirmations in every day life. Group discussed difficulty to forgive self when allowing grace and space to others, and how to allow for self.    Therapist Response:  Pt engaged in discussion and reports she continues to struggle with giving self as much grace and space she allows others.      Session Time: 11:00 -12:00   Participation Level: Active   Behavioral Response: CasualAlertDepressed   Type of Therapy: Group Therapy, Spiritual Care   Treatment Goals addressed: Coping   Progress Towards Goals: Progressing   Interventions: Supportive, Education   Summary:  Shelby Gallegos, Chaplain, led group.   Therapist Response: Pt participated        Session Time: 12:00 -1:00   Participation Level: Active   Behavioral Response: CasualAlertDepressed   Type of Therapy: Group therapy, Occupational Therapy   Treatment Goals addressed: Coping   Progress Towards Goals: Progressing   Interventions: Supportive; Psychoeducation   Summary: 12:00 - 12:50: Occupational Therapy group led by cln [name].  12:50 - 1:00 Clinician assessed for immediate needs, medication compliance and efficacy, and safety concerns.   Therapist Response: 12:00 - 12:50: See OT note 12:50 - 1:00 pm: At  check-out, patient reports no immediate concerns. Patient demonstrates progress as evidenced by continued engagement and responsiveness to treatment. Patient denies SI/HI/self-harm thoughts at the end of group.     Suicidal/Homicidal: Nowithout intent/plan  Plan: Pt will continue in PHP while working to increase mood stability, decrease depression symptoms, and increase ability to manage symptoms in a healthy manner.   Collaboration of Care: Medication Management [facility] [name]  Patient/Guardian was advised Release of Information must be obtained prior to any record release in order to collaborate their care with an outside provider. Patient/Guardian was advised if they have not already done so to contact the registration department to sign all necessary forms in order for Shelby Gallegos to release information regarding their care.   Consent: Patient/Guardian gives verbal consent for treatment and assignment of benefits for services provided during this visit. Patient/Guardian expressed understanding and agreed to proceed.   Diagnosis: Bipolar disorder, current episode depressed, severe, without psychotic features (HCC) [F31.4]    1. Bipolar disorder, current episode depressed, severe, without psychotic features [facility])       Shelby Gallegos, [facility] 05/18/2023

## 2023-05-19 ENCOUNTER — Encounter (HOSPITAL_COMMUNITY): Payer: Self-pay

## 2023-05-19 ENCOUNTER — Telehealth (HOSPITAL_COMMUNITY): Payer: Self-pay | Admitting: Psychiatry

## 2023-05-19 ENCOUNTER — Other Ambulatory Visit (HOSPITAL_COMMUNITY): Payer: BC Managed Care – PPO | Admitting: Professional

## 2023-05-19 ENCOUNTER — Other Ambulatory Visit (HOSPITAL_COMMUNITY): Payer: BC Managed Care – PPO

## 2023-05-19 DIAGNOSIS — R4589 Other symptoms and signs involving emotional state: Secondary | ICD-10-CM

## 2023-05-19 DIAGNOSIS — F319 Bipolar disorder, unspecified: Secondary | ICD-10-CM | POA: Diagnosis not present

## 2023-05-19 DIAGNOSIS — F314 Bipolar disorder, current episode depressed, severe, without psychotic features: Secondary | ICD-10-CM

## 2023-05-19 DIAGNOSIS — F909 Attention-deficit hyperactivity disorder, unspecified type: Secondary | ICD-10-CM | POA: Diagnosis not present

## 2023-05-19 DIAGNOSIS — Z7389 Other problems related to life management difficulty: Secondary | ICD-10-CM | POA: Diagnosis not present

## 2023-05-19 DIAGNOSIS — Z9151 Personal history of suicidal behavior: Secondary | ICD-10-CM | POA: Diagnosis not present

## 2023-05-19 NOTE — Therapy (Signed)
[facility] PARTIAL HOSPITALIZATION PROGRAM [address] [place], [place], [address] Phone: [phone]   Fax:  [phone]  Patient Details  Name: Shelby Gallegos MRN: [id number] Date of Birth: 1981-09-17 Referring Provider:  [name], PA-C  Encounter Date: 05/17/2023   Ted Mcalpine, OT 05/19/2023, 11:25 PM Kerrin Champagne, OT  [facility] PROGRAM [address] [place], [place], [address] Phone: [phone]   Fax:  [phone]

## 2023-05-19 NOTE — Therapy (Signed)
[facility] PARTIAL HOSPITALIZATION PROGRAM [address] [place], [place], [address] Phone: [phone]   Fax:  [phone]  Occupational Therapy Treatment Virtual Visit via Video Note  I connected with Shelby Gallegos on 05/19/23 at  8:00 AM EDT by a video enabled telemedicine application and verified that I am speaking with the correct person using two identifiers.  Location: Patient: home Provider: office   I discussed the limitations of evaluation and management by telemedicine and the availability of in person appointments. The patient expressed understanding and agreed to proceed.    The patient was advised to call back or seek an in-person evaluation if the symptoms worsen or if the condition fails to improve as anticipated.  I provided 55 minutes of non-face-to-face time during this encounter.  Patient Details  Name: Shelby Gallegos MRN: [id number] Date of Birth: 12/15/1980 No data recorded  Encounter Date: 05/19/2023   OT End of Session - 05/19/23 2355     Visit Number 14    Number of Visits 20    Date for OT Re-Evaluation 06/01/23    OT Start Time 1200    OT Stop Time 1255    OT Time Calculation (min) 55 min             Past Medical History:  Diagnosis Date   ADHD (attention deficit hyperactivity disorder)    Bipolar 1 disorder (HCC)    Migraine    Suicide attempt by drug overdose (HCC) 02/15/2019    Past Surgical History:  Procedure Laterality Date   NO PAST SURGERIES     TENDON REPAIR Bilateral 02/20/2019   Procedure: TENDON REPAIR BILATERAL FOREARMS;  Surgeon: [name], MD;  Location: [facility];  Service: Orthopedics;  Laterality: Bilateral;   WOUND EXPLORATION Bilateral 02/20/2019   Procedure: WOUND EXPLORATION;  Surgeon: [name], MD;  Location: [facility];  Service: Orthopedics;  Laterality: Bilateral;    There were no vitals filed for this visit.   Subjective Assessment - 05/19/23  2354     Currently in Pain? No/denies    Pain Score 0-No pain                 Group Session:  S: Doing better today.   O:  The objective of the session is to enhance the social interaction skills of adults with depression, anxiety, and other mental health disorders to improve their daily functioning and quality of life. The discussion will cover the impact of mental health on social capabilities, identifying barriers like fear of judgment, low self-esteem, and difficulty interpreting non-verbal cues. It will introduce strategies such as structured social activities, social skills training, and gradual exposure, with practical examples like joining book clubs or volunteering. The session aims to equip participants with tools to overcome social challenges, build meaningful relationships, and actively participate in social activities, all within the supportive framework of occupational therapy.   A: The engaged patient demonstrated a proactive approach during the session, actively participating in discussions and expressing a keen interest in strategies to improve social interactions. They shared personal experiences and challenges related to social anxiety and depression, indicating a high level of self-awareness and motivation to change. The patient was receptive to the suggested strategies, particularly showing interest in structured social activities and gradual exposure techniques. They asked relevant questions, suggesting a deep understanding of how their mental health affects their social interactions. This engagement level indicates a readiness to implement learned strategies and a positive  prognosis for improving social skills and overall mental wellness.    P: Continue to attend PHP OT group sessions 5x week for 4 weeks to promote daily structure, social engagement, and opportunities to develop and utilize adaptive strategies to maximize functional performance in preparation for  safe transition and integration back into school, work, and the community. Plan to address topic of pt 4 in next OT group session. '                 OT Education - 05/19/23 2355     Education Details Improving Social Interactions 3              OT Short Term Goals - 05/02/23 1005       OT SHORT TERM GOAL #1   Title Pt will be educated on strategies to improve psychosocial skills needed to participate fully in all daily, work, and leisure activities    Time 4    Period Weeks    Status On-going    Target Date 06/01/23      OT SHORT TERM GOAL #2   Title Pt will apply psychosocial skills and coping mechanisms to daily activities in order to function independently and reintegrate into community    Time 4    Period Weeks    Status On-going    Target Date 06/01/23      OT SHORT TERM GOAL #3   Title Pt will recall and/or apply 1-3 sleep hygiene strategies to improve BADL participation prior to reintegrating into community    Time 4    Period Weeks    Status On-going    Target Date 06/01/23      OT SHORT TERM GOAL #4   Title Pt will engage in goal setting to improve BADL/IADL participation prior to reintegrating into community    Time 4    Period Weeks    Status On-going    Target Date 06/01/23      OT SHORT TERM GOAL #5   Title Pt will create and/or implement functional BADL/IADL routine prior to reintegrating into community    Time 4    Period Weeks    Status On-going    Target Date 06/01/23                      Plan - 05/19/23 2355     Psychosocial Skills Coping Strategies;Habits;Routines and Behaviors;Interpersonal Interaction             Patient will benefit from skilled therapeutic intervention in order to improve the following deficits and impairments:       Psychosocial Skills: Coping Strategies, Habits, Routines and Behaviors, Interpersonal Interaction   Visit Diagnosis: Difficulty coping    Problem List Patient  Active Problem List   Diagnosis Date Noted   Panic attacks 05/10/2023   Bipolar disorder, current episode depressed, severe (HCC) 04/15/2023   Hypothyroidism 03/30/2023   Prediabetes 03/30/2023   Nicotine vapor product user 03/30/2023   Suicide attempt [facility])    Intentional acetaminophen overdose (HCC)    History of suicide attempt 04/01/2017   Borderline personality disorder (HCC) 02/16/2017   Bipolar affective disorder, depressed, severe (HCC) 08/21/2015   ADHD (attention deficit hyperactivity disorder) 08/21/2015   Headache, migraine 09/28/2014   Abnormal ECG 11/26/2009    Ted Mcalpine, OT 05/19/2023, 11:55 PM Kerrin Champagne, OT    [facility] HOSPITALIZATION PROGRAM [address] [place], [place], [address] Phone: [phone]  Fax:  [phone]  Name: Shelby Gallegos MRN: [id number] Date of Birth: [birth year]

## 2023-05-19 NOTE — Therapy (Signed)
[facility] PARTIAL HOSPITALIZATION PROGRAM [address] [place], [place], [address] Phone: [phone]   Fax:  [phone]  Occupational Therapy Treatment Virtual Visit via Video Note  I connected with Shelby Gallegos on 05/19/23 at  8:00 AM EDT by a video enabled telemedicine application and verified that I am speaking with the correct person using two identifiers.  Location: Patient: home Provider: office   I discussed the limitations of evaluation and management by telemedicine and the availability of in person appointments. The patient expressed understanding and agreed to proceed.    The patient was advised to call back or seek an in-person evaluation if the symptoms worsen or if the condition fails to improve as anticipated.  I provided 55 minutes of non-face-to-face time during this encounter.   Patient Details  Name: Shelby Gallegos MRN: [id number] Date of Birth: 12-16-1980 No data recorded  Encounter Date: 05/18/2023   OT End of Session - 05/19/23 2335     Visit Number 13    Number of Visits 20    Date for OT Re-Evaluation 06/01/23    OT Start Time 1200    OT Stop Time 1255    OT Time Calculation (min) 55 min             Past Medical History:  Diagnosis Date   ADHD (attention deficit hyperactivity disorder)    Bipolar 1 disorder (HCC)    Migraine    Suicide attempt by drug overdose (HCC) 02/15/2019    Past Surgical History:  Procedure Laterality Date   NO PAST SURGERIES     TENDON REPAIR Bilateral 02/20/2019   Procedure: TENDON REPAIR BILATERAL FOREARMS;  Surgeon: [name], MD;  Location: [facility];  Service: Orthopedics;  Laterality: Bilateral;   WOUND EXPLORATION Bilateral 02/20/2019   Procedure: WOUND EXPLORATION;  Surgeon: [name], MD;  Location: [facility];  Service: Orthopedics;  Laterality: Bilateral;    There were no vitals filed for this visit.   Subjective Assessment - 05/19/23  2335     Currently in Pain? No/denies    Pain Score 0-No pain                   Group Session:  S: Feeling better today.   O:  The objective of the session is to enhance the social interaction skills of adults with depression, anxiety, and other mental health disorders to improve their daily functioning and quality of life. The discussion will cover the impact of mental health on social capabilities, identifying barriers like fear of judgment, low self-esteem, and difficulty interpreting non-verbal cues. It will introduce strategies such as structured social activities, social skills training, and gradual exposure, with practical examples like joining book clubs or volunteering. The session aims to equip participants with tools to overcome social challenges, build meaningful relationships, and actively participate in social activities, all within the supportive framework of occupational therapy.   A: The engaged patient demonstrated a proactive approach during the session, actively participating in discussions and expressing a keen interest in strategies to improve social interactions. They shared personal experiences and challenges related to social anxiety and depression, indicating a high level of self-awareness and motivation to change. The patient was receptive to the suggested strategies, particularly showing interest in structured social activities and gradual exposure techniques. They asked relevant questions, suggesting a deep understanding of how their mental health affects their social interactions. This engagement level indicates a readiness to implement learned strategies  and a positive prognosis for improving social skills and overall mental wellness.    P: Continue to attend PHP OT group sessions 5x week for 4 weeks to promote daily structure, social engagement, and opportunities to develop and utilize adaptive strategies to maximize functional performance in preparation  for safe transition and integration back into school, work, and the community. Plan to address topic of pt 3 in next OT group session.                OT Education - 05/19/23 2335     Education Details Improving Social Interactions 2              OT Short Term Goals - 05/02/23 1005       OT SHORT TERM GOAL #1   Title Pt will be educated on strategies to improve psychosocial skills needed to participate fully in all daily, work, and leisure activities    Time 4    Period Weeks    Status On-going    Target Date 06/01/23      OT SHORT TERM GOAL #2   Title Pt will apply psychosocial skills and coping mechanisms to daily activities in order to function independently and reintegrate into community    Time 4    Period Weeks    Status On-going    Target Date 06/01/23      OT SHORT TERM GOAL #3   Title Pt will recall and/or apply 1-3 sleep hygiene strategies to improve BADL participation prior to reintegrating into community    Time 4    Period Weeks    Status On-going    Target Date 06/01/23      OT SHORT TERM GOAL #4   Title Pt will engage in goal setting to improve BADL/IADL participation prior to reintegrating into community    Time 4    Period Weeks    Status On-going    Target Date 06/01/23      OT SHORT TERM GOAL #5   Title Pt will create and/or implement functional BADL/IADL routine prior to reintegrating into community    Time 4    Period Weeks    Status On-going    Target Date 06/01/23                      Plan - 05/19/23 2336     Psychosocial Skills Coping Strategies;Habits;Routines and Behaviors;Interpersonal Interaction             Patient will benefit from skilled therapeutic intervention in order to improve the following deficits and impairments:       Psychosocial Skills: Coping Strategies, Habits, Routines and Behaviors, Interpersonal Interaction   Visit Diagnosis: Difficulty coping    Problem List Patient  Active Problem List   Diagnosis Date Noted   Panic attacks 05/10/2023   Bipolar disorder, current episode depressed, severe (HCC) 04/15/2023   Hypothyroidism 03/30/2023   Prediabetes 03/30/2023   Nicotine vapor product user 03/30/2023   Suicide attempt [facility])    Intentional acetaminophen overdose (HCC)    History of suicide attempt 04/01/2017   Borderline personality disorder (HCC) 02/16/2017   Bipolar affective disorder, depressed, severe (HCC) 08/21/2015   ADHD (attention deficit hyperactivity disorder) 08/21/2015   Headache, migraine 09/28/2014   Abnormal ECG 11/26/2009    Ted Mcalpine, OT 05/19/2023, 11:40 PM  Kerrin Champagne, OT   [facility] HOSPITALIZATION PROGRAM [address] [place], [place], [address] Phone: [phone]  Fax:  [phone]  Name: Saroya Adams MRN: [id number] Date of Birth: 01/27/1981

## 2023-05-20 ENCOUNTER — Telehealth (HOSPITAL_COMMUNITY): Payer: Self-pay | Admitting: Psychiatry

## 2023-05-20 ENCOUNTER — Other Ambulatory Visit (HOSPITAL_COMMUNITY): Payer: BC Managed Care – PPO

## 2023-05-20 ENCOUNTER — Other Ambulatory Visit (HOSPITAL_COMMUNITY): Payer: BC Managed Care – PPO | Admitting: Professional

## 2023-05-20 ENCOUNTER — Encounter (HOSPITAL_COMMUNITY): Payer: Self-pay

## 2023-05-20 DIAGNOSIS — F314 Bipolar disorder, current episode depressed, severe, without psychotic features: Secondary | ICD-10-CM

## 2023-05-20 DIAGNOSIS — F909 Attention-deficit hyperactivity disorder, unspecified type: Secondary | ICD-10-CM | POA: Diagnosis not present

## 2023-05-20 DIAGNOSIS — F319 Bipolar disorder, unspecified: Secondary | ICD-10-CM | POA: Diagnosis not present

## 2023-05-20 DIAGNOSIS — R4589 Other symptoms and signs involving emotional state: Secondary | ICD-10-CM

## 2023-05-20 DIAGNOSIS — Z9151 Personal history of suicidal behavior: Secondary | ICD-10-CM | POA: Diagnosis not present

## 2023-05-20 DIAGNOSIS — Z7389 Other problems related to life management difficulty: Secondary | ICD-10-CM | POA: Diagnosis not present

## 2023-05-20 NOTE — Telephone Encounter (Signed)
D:  Returned pt's call re: MH-IOP.  A:  Re-oriented pt.  Discussed group expectations.  Pt will start virtual MH-IOP on 05-25-23 @ 9 a.m.  R:  Pt receptive.

## 2023-05-20 NOTE — Therapy (Signed)
[facility] PARTIAL HOSPITALIZATION PROGRAM [address] [place], [place], [address] Phone: [phone]   Fax:  [phone]  Occupational Therapy Treatment Virtual Visit via Video Note  I connected with [name] on 05/20/23 at  8:00 AM EDT by a video enabled telemedicine application and verified that I am speaking with the correct person using two identifiers.  Location: Patient: home Provider: office   I discussed the limitations of evaluation and management by telemedicine and the availability of in person appointments. The patient expressed understanding and agreed to proceed.    The patient was advised to call back or seek an in-person evaluation if the symptoms worsen or if the condition fails to improve as anticipated.  I provided 55 minutes of non-face-to-face time during this encounter.   Patient Details  Name: Shelby Gallegos MRN: [id number] Date of Birth: February 06, 1981 No data recorded  Encounter Date: 05/20/2023   OT End of Session - 05/20/23 2330     Visit Number 15    Number of Visits 20    Date for OT Re-Evaluation 06/01/23    OT Start Time 1200    OT Stop Time 1255    OT Time Calculation (min) 55 min             Past Medical History:  Diagnosis Date   ADHD (attention deficit hyperactivity disorder)    Bipolar 1 disorder (HCC)    Migraine    Suicide attempt by drug overdose (HCC) 02/15/2019    Past Surgical History:  Procedure Laterality Date   NO PAST SURGERIES     TENDON REPAIR Bilateral 02/20/2019   Procedure: TENDON REPAIR BILATERAL FOREARMS;  Surgeon: [name], MD;  Location: [facility];  Service: Orthopedics;  Laterality: Bilateral;   WOUND EXPLORATION Bilateral 02/20/2019   Procedure: WOUND EXPLORATION;  Surgeon: [name], MD;  Location: [facility];  Service: Orthopedics;  Laterality: Bilateral;    There were no vitals filed for this visit.   Subjective Assessment - 05/20/23  2330     Currently in Pain? No/denies    Pain Score 0-No pain               Group Session:  S: Doing better today.   O: In this group therapy session, the objective was to explore the multifaceted concept of purpose. The participants engaged in a deep exploration of the innate human impulse for purposeful engagement and the impact of purpose on mental and emotional well-being. The discussion centered around the challenges of modern life, including feelings of isolation despite technological advancements, the connection between purposelessness and depression/anxiety, and the importance of occupational roles in achieving fulfillment. Strategies were shared to identify areas of life where purpose is lacking and to develop immediate and actionable plans to overcome these challenges. The participants gained valuable insights into the intersection of purpose and mental health, as well as practical tools to navigate their personal journeys towards purpose-driven lives. Overall, the session fostered a sense of empowerment and inspired the participants to take proactive steps towards aligning their lives with their true purpose.   A: Patient demonstrated a high level of engagement and active participation. They actively contributed to the discussions, sharing personal insights and experiences related to the concept of purpose. Patient demonstrated a clear understanding of the relationship between purpose and mental well-being, acknowledging the importance of aligning actions with values and setting meaningful goals. They actively engaged in self-reflection exercises and expressed a commitment to  incorporating strategies discussed in the session into their daily life. Patient exhibited a strong motivation to cultivate purpose and showed a willingness to take proactive steps towards living a more purposeful and fulfilling life. Overall, Patient's active participation and enthusiasm contributed to a positive  and collaborative therapeutic environment.   P: Continue to attend PHP OT group sessions 5x week for 4 weeks to promote daily structure, social engagement, and opportunities to develop and utilize adaptive strategies to maximize functional performance in preparation for safe transition and integration back into school, work, and the community. Plan to address topic of pt 2 in next OT group session.                    OT Education - 05/20/23 2330     Education Details Discovering Purpose 1              OT Short Term Goals - 05/02/23 1005       OT SHORT TERM GOAL #1   Title Pt will be educated on strategies to improve psychosocial skills needed to participate fully in all daily, work, and leisure activities    Time 4    Period Weeks    Status On-going    Target Date 06/01/23      OT SHORT TERM GOAL #2   Title Pt will apply psychosocial skills and coping mechanisms to daily activities in order to function independently and reintegrate into community    Time 4    Period Weeks    Status On-going    Target Date 06/01/23      OT SHORT TERM GOAL #3   Title Pt will recall and/or apply 1-3 sleep hygiene strategies to improve BADL participation prior to reintegrating into community    Time 4    Period Weeks    Status On-going    Target Date 06/01/23      OT SHORT TERM GOAL #4   Title Pt will engage in goal setting to improve BADL/IADL participation prior to reintegrating into community    Time 4    Period Weeks    Status On-going    Target Date 06/01/23      OT SHORT TERM GOAL #5   Title Pt will create and/or implement functional BADL/IADL routine prior to reintegrating into community    Time 4    Period Weeks    Status On-going    Target Date 06/01/23                      Plan - 05/20/23 2330     Psychosocial Skills Coping Strategies;Habits;Routines and Behaviors;Interpersonal Interaction             Patient will benefit from skilled  therapeutic intervention in order to improve the following deficits and impairments:       Psychosocial Skills: Coping Strategies, Habits, Routines and Behaviors, Interpersonal Interaction   Visit Diagnosis: Difficulty coping    Problem List Patient Active Problem List   Diagnosis Date Noted   Panic attacks 05/10/2023   Bipolar disorder, current episode depressed, severe (HCC) 04/15/2023   Hypothyroidism 03/30/2023   Prediabetes 03/30/2023   Nicotine vapor product user 03/30/2023   Suicide attempt [facility])    Intentional acetaminophen overdose (HCC)    History of suicide attempt 04/01/2017   Borderline personality disorder (HCC) 02/16/2017   Bipolar affective disorder, depressed, severe (HCC) 08/21/2015   ADHD (attention deficit hyperactivity disorder) 08/21/2015   Headache, migraine 09/28/2014  Abnormal ECG 11/26/2009    Ted Mcalpine, OT 05/20/2023, 11:31 PM  Kerrin Champagne, OT   [facility] PROGRAM [address] [place], [place], [address] Phone: [phone]   Fax:  [phone]  Name: Saanvi Hakala MRN: [id number] Date of Birth: January 05, 1981

## 2023-05-23 ENCOUNTER — Other Ambulatory Visit (HOSPITAL_COMMUNITY): Payer: BC Managed Care – PPO | Admitting: Licensed Clinical Social Worker

## 2023-05-23 ENCOUNTER — Other Ambulatory Visit (HOSPITAL_COMMUNITY): Payer: BC Managed Care – PPO

## 2023-05-23 DIAGNOSIS — Z9151 Personal history of suicidal behavior: Secondary | ICD-10-CM | POA: Diagnosis not present

## 2023-05-23 DIAGNOSIS — R4589 Other symptoms and signs involving emotional state: Secondary | ICD-10-CM

## 2023-05-23 DIAGNOSIS — Z7389 Other problems related to life management difficulty: Secondary | ICD-10-CM | POA: Diagnosis not present

## 2023-05-23 DIAGNOSIS — F314 Bipolar disorder, current episode depressed, severe, without psychotic features: Secondary | ICD-10-CM

## 2023-05-23 DIAGNOSIS — F909 Attention-deficit hyperactivity disorder, unspecified type: Secondary | ICD-10-CM | POA: Diagnosis not present

## 2023-05-23 DIAGNOSIS — F319 Bipolar disorder, unspecified: Secondary | ICD-10-CM | POA: Diagnosis not present

## 2023-05-23 NOTE — Progress Notes (Signed)
Spoke with patient via Teams video call, used 2 identifiers to correctly identify patient. States that groups are going well. They found her roommate and placed her in jail. She feels bad about it but understands she made her choice. Had some dark thoughts last night after a nightmare but denies SI/HI or AV hallucinations. On scale 1-10 as 10 being worst she rates depression at 3/4 and anxiety at 7. No side effects from medications. No issues or complaints. Starts IOP Wednesday. Has some anxiety about that.

## 2023-05-23 NOTE — Psych (Signed)
Virtual Visit via Video Note  I connected with Shelby Gallegos on 05/23/23 at  9:00 AM EDT by a video enabled telemedicine application and verified that I am speaking with the correct person using two identifiers.  Location: Patient: pt's home in [place], [place] Provider: clinical home office in [place], [place]   I discussed the limitations of evaluation and management by telemedicine and the availability of in person appointments. The patient expressed understanding and agreed to proceed.   I discussed the assessment and treatment plan with the patient. The patient was provided an opportunity to ask questions and all were answered. The patient agreed with the plan and demonstrated an understanding of the instructions.   The patient was advised to call back or seek an in-person evaluation if the symptoms worsen or if the condition fails to improve as anticipated.  I provided 240 minutes of non-face-to-face time during this encounter.   [name], LCSW   [facility] PHP THERAPIST PROGRESS NOTE  Shelby Gallegos [id number]   Session Time: 9:00 am - 10:00 am  Participation Level: Active  Behavioral Response: CasualAlertDepressed  Type of Therapy: Group Therapy  Treatment Goals addressed: Coping  Progress Towards Goals: Progressing  Interventions: CBT, DBT, Solution Focused, Strength-based, Supportive, and Reframing  Therapist Response: Clinician led check-in regarding current stressors and situation, and review of patient completed daily inventory. Clinician utilized active listening and empathetic response and validated patient emotions. Clinician facilitated processing group on pertinent issues.?   Summary: Patient arrived within time allowed. Patient rates her mood at a 7 on a scale of 1-10 with 10 being best. Pt reported, "I would be an 8 but my sleep was not really good last night." She reports she woke up from a nightmare and couldn't go back to sleep. She reports she only ate once  yesterday. She reports she didn't do much over the weekend, but had an eventful Saturday, as police came to serve a warrant on her roommate, who is already in police custody. She reports she completed a lego set of a life-size camera and did Bible study. She endorses some "dark thoughts" last night, but denies other SI and SH thoughts. She reports she is nervous about discharge tomorrow and transitioning to IOP. She declines a topic for processing. Pt able to process.?Pt engaged in discussion.?      Session Time: 10:00 am - 11:00 am  Participation Level: Active  Behavioral Response: CasualAlertDepressed  Type of Therapy: Group Therapy  Treatment Goals addressed: Coping  Progress Towards Goals: Progressing  Interventions: CBT, DBT, Solution Focused, Strength-based, Supportive, and Reframing  Therapist Response: Cln led group on coping with things which are outside of our control. Cln utilized CBT and Scientist, research (medical) to inform discussion.    Summary: Pt engaged in discussion. She stated she still catches herself blaming herself for her roommate being in jail. She is receptive to feedback.    Session Time: 11:00 am - 12:00 pm  Participation Level: Active  Behavioral Response: CasualAlertDepressed  Type of Therapy: Group Therapy  Treatment Goals addressed: Coping  Progress Towards Goals: Progressing  Interventions: CBT, DBT, Solution Focused, Strength-based, Supportive, and Reframing  Therapist Response:  Group viewed Ted Talk entitled "The Power of Vulnerability" by Dewain Penning and then cln led discussion surrounding vulnerability. Clinician utilized CBT principles to inform discussion.   Summary: Pt engaged in discussion. She demonstrates good insight into the subject matter.   Session Time: 12:00 pm - 1:00 pm  Participation Level: Active  Behavioral Response: CasualAlertDepressed  Type of Therapy: Group Therapy  Treatment Goals addressed:  Coping  Progress Towards Goals: Progressing  Interventions: CBT, DBT, Solution Focused, Strength-based, Supportive, and Reframing  Therapist Response: 12:00 - 12:50 pm: See OT note. 12:50 - 1:00 pm: Clinician led check-out. Clinician assessed for immediate needs, medication compliance and efficacy, and safety concerns?  Summary: 12:00 - 12:50 pm: See OT note 12:50 - 1:00 pm: At check-out, patient contracts for safety.?Patient demonstrates progress as evidenced by her continued engagement and by being receptive to treatment. Patient denies SI/HI/self-harm thoughts at the end of group and agrees to seek help should those thoughts/feelings occur.?   Suicidal/Homicidal: Nowithout intent/plan  Plan: ?Pt will continue in PHP and medication management while continuing to work on decreasing depression symptoms,?SI, and anxiety symptoms,?and increasing the ability to self manage symptoms.  Collaboration of Care: Medication Management AEB Hillery Jacks, NP  Patient/Guardian was advised Release of Information must be obtained prior to any record release in order to collaborate their care with an outside provider. Patient/Guardian was advised if they have not already done so to contact the registration department to sign all necessary forms in order for [place] to release information regarding their care.   Consent: Patient/Guardian gives verbal consent for treatment and assignment of benefits for services provided during this visit. Patient/Guardian expressed understanding and agreed to proceed.   Diagnosis: Bipolar disorder, current episode depressed, severe, without psychotic features (HCC) [F31.4]    1. Bipolar disorder, current episode depressed, severe, without psychotic features [facility])       [name], LCSW 05/23/2023

## 2023-05-24 ENCOUNTER — Other Ambulatory Visit (HOSPITAL_COMMUNITY): Payer: BC Managed Care – PPO | Admitting: Family

## 2023-05-24 ENCOUNTER — Other Ambulatory Visit (HOSPITAL_COMMUNITY): Payer: BC Managed Care – PPO

## 2023-05-24 ENCOUNTER — Encounter (HOSPITAL_COMMUNITY): Payer: Self-pay

## 2023-05-24 DIAGNOSIS — R4589 Other symptoms and signs involving emotional state: Secondary | ICD-10-CM

## 2023-05-24 DIAGNOSIS — F909 Attention-deficit hyperactivity disorder, unspecified type: Secondary | ICD-10-CM | POA: Diagnosis not present

## 2023-05-24 DIAGNOSIS — F319 Bipolar disorder, unspecified: Secondary | ICD-10-CM | POA: Diagnosis not present

## 2023-05-24 DIAGNOSIS — Z9151 Personal history of suicidal behavior: Secondary | ICD-10-CM | POA: Diagnosis not present

## 2023-05-24 DIAGNOSIS — Z7389 Other problems related to life management difficulty: Secondary | ICD-10-CM | POA: Diagnosis not present

## 2023-05-24 DIAGNOSIS — F314 Bipolar disorder, current episode depressed, severe, without psychotic features: Secondary | ICD-10-CM

## 2023-05-24 NOTE — Therapy (Signed)
[facility] PARTIAL HOSPITALIZATION PROGRAM [address] [place], [place], [address] Phone: [phone]   Fax:  [phone]  Occupational Therapy Treatment Virtual Visit via Video Note  I connected with Shelby Gallegos on 05/24/23 at  8:00 AM EDT by a video enabled telemedicine application and verified that I am speaking with the correct person using two identifiers.  Location: Patient: home Provider: office   I discussed the limitations of evaluation and management by telemedicine and the availability of in person appointments. The patient expressed understanding and agreed to proceed.    The patient was advised to call back or seek an in-person evaluation if the symptoms worsen or if the condition fails to improve as anticipated.  I provided 55 minutes of non-face-to-face time during this encounter.  Patient Details  Name: Shelby Gallegos MRN: [id number] Date of Birth: 26-Jun-1981 No data recorded  Encounter Date: 05/23/2023   OT End of Session - 05/24/23 1647     Visit Number 16    Number of Visits 20    Date for OT Re-Evaluation 06/01/23    OT Start Time 1200    OT Stop Time 1255    OT Time Calculation (min) 55 min             Past Medical History:  Diagnosis Date   ADHD (attention deficit hyperactivity disorder)    Bipolar 1 disorder (HCC)    Migraine    Suicide attempt by drug overdose (HCC) 02/15/2019    Past Surgical History:  Procedure Laterality Date   NO PAST SURGERIES     TENDON REPAIR Bilateral 02/20/2019   Procedure: TENDON REPAIR BILATERAL FOREARMS;  Surgeon: [name], MD;  Location: [facility];  Service: Orthopedics;  Laterality: Bilateral;   WOUND EXPLORATION Bilateral 02/20/2019   Procedure: WOUND EXPLORATION;  Surgeon: [name], MD;  Location: [facility];  Service: Orthopedics;  Laterality: Bilateral;    There were no vitals filed for this visit.   Subjective Assessment - 05/24/23  1647     Currently in Pain? No/denies    Pain Score 0-No pain                 Group Session:  S: Doing better today.   O: In this group therapy session, the objective was to explore the multifaceted concept of purpose. The participants engaged in a deep exploration of the innate human impulse for purposeful engagement and the impact of purpose on mental and emotional well-being. The discussion centered around the challenges of modern life, including feelings of isolation despite technological advancements, the connection between purposelessness and depression/anxiety, and the importance of occupational roles in achieving fulfillment. Strategies were shared to identify areas of life where purpose is lacking and to develop immediate and actionable plans to overcome these challenges. The participants gained valuable insights into the intersection of purpose and mental health, as well as practical tools to navigate their personal journeys towards purpose-driven lives. Overall, the session fostered a sense of empowerment and inspired the participants to take proactive steps towards aligning their lives with their true purpose.   A: Patient demonstrated a high level of engagement and active participation. They actively contributed to the discussions, sharing personal insights and experiences related to the concept of purpose. Patient demonstrated a clear understanding of the relationship between purpose and mental well-being, acknowledging the importance of aligning actions with values and setting meaningful goals. They actively engaged in self-reflection exercises and expressed a commitment  to incorporating strategies discussed in the session into their daily life. Patient exhibited a strong motivation to cultivate purpose and showed a willingness to take proactive steps towards living a more purposeful and fulfilling life. Overall, Patient's active participation and enthusiasm contributed to a  positive and collaborative therapeutic environment.   P: Continue to attend PHP OT group sessions 5x week for 4 weeks to promote daily structure, social engagement, and opportunities to develop and utilize adaptive strategies to maximize functional performance in preparation for safe transition and integration back into school, work, and the community. Plan to address topic of SMART goals in next OT group session.                  OT Education - 05/24/23 1647     Education Details Discovering Purpose 2              OT Short Term Goals - 05/02/23 1005       OT SHORT TERM GOAL #1   Title Pt will be educated on strategies to improve psychosocial skills needed to participate fully in all daily, work, and leisure activities    Time 4    Period Weeks    Status On-going    Target Date 06/01/23      OT SHORT TERM GOAL #2   Title Pt will apply psychosocial skills and coping mechanisms to daily activities in order to function independently and reintegrate into community    Time 4    Period Weeks    Status On-going    Target Date 06/01/23      OT SHORT TERM GOAL #3   Title Pt will recall and/or apply 1-3 sleep hygiene strategies to improve BADL participation prior to reintegrating into community    Time 4    Period Weeks    Status On-going    Target Date 06/01/23      OT SHORT TERM GOAL #4   Title Pt will engage in goal setting to improve BADL/IADL participation prior to reintegrating into community    Time 4    Period Weeks    Status On-going    Target Date 06/01/23      OT SHORT TERM GOAL #5   Title Pt will create and/or implement functional BADL/IADL routine prior to reintegrating into community    Time 4    Period Weeks    Status On-going    Target Date 06/01/23                      Plan - 05/24/23 1647     Psychosocial Skills Coping Strategies;Habits;Routines and Behaviors;Interpersonal Interaction             Patient will benefit  from skilled therapeutic intervention in order to improve the following deficits and impairments:       Psychosocial Skills: Coping Strategies, Habits, Routines and Behaviors, Interpersonal Interaction   Visit Diagnosis: Difficulty coping    Problem List Patient Active Problem List   Diagnosis Date Noted   Panic attacks 05/10/2023   Bipolar disorder, current episode depressed, severe (HCC) 04/15/2023   Hypothyroidism 03/30/2023   Prediabetes 03/30/2023   Nicotine vapor product user 03/30/2023   Suicide attempt [facility])    Intentional acetaminophen overdose (HCC)    History of suicide attempt 04/01/2017   Borderline personality disorder (HCC) 02/16/2017   Bipolar affective disorder, depressed, severe (HCC) 08/21/2015   ADHD (attention deficit hyperactivity disorder) 08/21/2015   Headache, migraine 09/28/2014  Abnormal ECG 11/26/2009    Ted Mcalpine, OT 05/24/2023, 4:47 PM  Kerrin Champagne, OT   [facility] PROGRAM [address] [place], [place], [address] Phone: [phone]   Fax:  [phone]  Name: Shelby Gallegos MRN: [id number] Date of Birth: December 31, 1980

## 2023-05-24 NOTE — Psych (Signed)
Virtual Visit via Video Note  I connected with Shelby Gallegos on 05/24/23 at  9:00 AM EDT by a video enabled telemedicine application and verified that I am speaking with the correct person using two identifiers.  Location: Patient: pt's home in [place], [place] Provider: clinical home office in [place], [place]   I discussed the limitations of evaluation and management by telemedicine and the availability of in person appointments. The patient expressed understanding and agreed to proceed.   I discussed the assessment and treatment plan with the patient. The patient was provided an opportunity to ask questions and all were answered. The patient agreed with the plan and demonstrated an understanding of the instructions.   The patient was advised to call back or seek an in-person evaluation if the symptoms worsen or if the condition fails to improve as anticipated.  I provided 240 minutes of non-face-to-face time during this encounter.   [name], LCSW   [facility] PHP THERAPIST PROGRESS NOTE  Shelby Gallegos [id number]   Session Time: 9:00 am - 10:00 am  Participation Level: Active  Behavioral Response: CasualAlertEuthymic  Type of Therapy: Group Therapy  Treatment Goals addressed: Coping  Progress Towards Goals: Progressing  Interventions: CBT, DBT, Solution Focused, Strength-based, Supportive, and Reframing  Therapist Response: Clinician led check-in regarding current stressors and situation, and review of patient completed daily inventory. Clinician utilized active listening and empathetic response and validated patient emotions. Clinician facilitated processing group on pertinent issues.?   Summary: Patient arrived within time allowed. Patient rates her mood at a 7/8 on a scale of 1-10 with 10 being best. Pt reported, "Sleep was a big issue last night. I just tossed and turned and woke up three times. I probably got a total of five hours of sleep." She reports she drove to [facility] yesterday to pick up a Lego set, which was an hour drive for her. She also reports she did some things around the house. She denies SI and SH thoughts. She denies a topic for processing. Pt able to process.?Pt engaged in discussion.?      Session Time: 10:00 am - 11:00 am  Participation Level: Active  Behavioral Response: CasualAlertEuthymic  Type of Therapy: Group Therapy  Treatment Goals addressed: Coping  Progress Towards Goals: Progressing  Interventions: CBT, DBT, Solution Focused, Strength-based, Supportive, and Reframing  Therapist Response: Cln led group on the therapeutic benefit of animals. Cln utilized Positive Psychology principles to inform discussion.   Summary: Pt engaged in discussion. She shares that she is currently taking care of her roommate's dog and she hopes she can keep him. She also shares that she has considered going back to school to become certified to train therapy animals. She discusses how animals and pets have been therapeutic to her.    Session Time: 11:00 am - 12:00 pm  Participation Level: Active  Behavioral Response: CasualAlertEuthymic  Type of Therapy: Group Therapy  Treatment Goals addressed: Coping  Progress Towards Goals: Progressing  Interventions: CBT, DBT, Solution Focused, Strength-based, Supportive, and Reframing  Therapist Response: Clinician led discussion on anxiety. Clinician began with psychoeducation video explaining the brain's fear response and then discussion was held around the benefits of exposure therapy techniques to reduce anxiety responses. Clinician utilized CBT principles to inform discussion.   Summary: Pt engaged in discussion. Pt demonstrated good insight into the subject matter.   Session Time: 12:00 pm - 1:00 pm  Participation Level: Active  Behavioral Response: CasualAlertEuthymic  Type of Therapy: Group Therapy  Treatment Goals addressed: Coping  Progress Towards Goals:  Progressing  Interventions: CBT, DBT, Solution Focused, Strength-based, Supportive, and Reframing  Therapist Response: 12:00 - 12:50 pm: See OT note. 12:50 - 1:00 pm: Clinician led check-out. Clinician assessed for immediate needs, medication compliance and efficacy, and safety concerns?  Summary: 12:00 - 12:50 pm: See OT note 12:50 - 1:00 pm: At check-out, patient contracts for safety.?Patient demonstrates progress as evidenced by her continued engagement and by being receptive to treatment. Patient denies SI/HI/self-harm thoughts at the end of group and agrees to seek help should those thoughts/feelings occur.?   Suicidal/Homicidal: Nowithout intent/plan  Plan: Pt will discharge from PHP due to meeting treatment goals of decreased depression symptoms, increased emotion regulation, and increased ability to manage symptoms in a healthy manner. Pt will step down to IOP within this agency on 05/24/2023. Pt and provider are aligned with discharge plan. Pt denies SI/HI at time of discharge.   Collaboration of Care: Medication Management AEB Hillery Jacks, NP  Patient/Guardian was advised Release of Information must be obtained prior to any record release in order to collaborate their care with an outside provider. Patient/Guardian was advised if they have not already done so to contact the registration department to sign all necessary forms in order for [place] to release information regarding their care.   Consent: Patient/Guardian gives verbal consent for treatment and assignment of benefits for services provided during this visit. Patient/Guardian expressed understanding and agreed to proceed.   Diagnosis: Bipolar disorder, current episode depressed, severe, without psychotic features (HCC) [F31.4]    1. Bipolar disorder, current episode depressed, severe, without psychotic features (HCC)   2. Difficulty coping       Oneta Rack, NP 05/24/2023

## 2023-05-24 NOTE — Progress Notes (Signed)
  [facility]  Partial Hospitalization Outpatient Program Discharge Summary  Ramata Strothman [id number]  Admission date: 04/29/2023 Discharge date: 05/24/2023  Reason for admission: Kenedy Haisley 42 year old Caucasian female was recently discharged from inpatient admission on 04/15/2023 due to worsening depression and anxiety.  She has a charted history with major depressive disorder, bipolar 1 disorder, attention deficit disorder and generalized anxiety disorder.   Progress in Program Toward Treatment Goals: Progressing patient attended and participated with daily group session with active and engaged participation.  Currently denying suicidal or homicidal ideations.  Denies auditory or visual hallucinations.  Reports overall her mood has improved states that she has experienced growth throughout attended the program this time.  Reports multiple inpatient admissions.  She reports she has completed intensive outpatient programming and PHP in the past.  Patient is requesting medication refills with Zoloft.  Will make medications available at pharmacy.  Patient to stepdown to intensive outpatient programming on 05/25/2023.  Progress (rationale): Stepping down to intensive outpatient programming  Collaboration of Care: Medication Management AEB Zoloft, Lamictal, Minipress, Vraylar, Xanax and Psychiatrist AEB Rupinder [place] Dragon  Patient/Guardian was advised Release of Information must be obtained prior to any record release in order to collaborate their care with an outside provider. Patient/Guardian was advised if they have not already done so to contact the registration department to sign all necessary forms in order for [place] to release information regarding their care.   Consent: Patient/Guardian gives verbal consent for treatment and assignment of benefits for services provided during this visit. Patient/Guardian expressed understanding and agreed to proceed.   Hillery Jacks, NP 05/24/2023

## 2023-05-25 ENCOUNTER — Other Ambulatory Visit (HOSPITAL_COMMUNITY): Payer: BC Managed Care – PPO | Admitting: Psychiatry

## 2023-05-25 ENCOUNTER — Other Ambulatory Visit (HOSPITAL_COMMUNITY): Payer: BC Managed Care – PPO

## 2023-05-25 ENCOUNTER — Encounter (HOSPITAL_COMMUNITY): Payer: Self-pay | Admitting: Psychiatry

## 2023-05-25 ENCOUNTER — Encounter (HOSPITAL_COMMUNITY): Payer: Self-pay

## 2023-05-25 DIAGNOSIS — R4589 Other symptoms and signs involving emotional state: Secondary | ICD-10-CM | POA: Diagnosis not present

## 2023-05-25 DIAGNOSIS — Z7389 Other problems related to life management difficulty: Secondary | ICD-10-CM | POA: Diagnosis not present

## 2023-05-25 DIAGNOSIS — F319 Bipolar disorder, unspecified: Secondary | ICD-10-CM | POA: Diagnosis not present

## 2023-05-25 DIAGNOSIS — F4312 Post-traumatic stress disorder, chronic: Secondary | ICD-10-CM | POA: Diagnosis not present

## 2023-05-25 DIAGNOSIS — F314 Bipolar disorder, current episode depressed, severe, without psychotic features: Secondary | ICD-10-CM

## 2023-05-25 DIAGNOSIS — Z9151 Personal history of suicidal behavior: Secondary | ICD-10-CM | POA: Diagnosis not present

## 2023-05-25 DIAGNOSIS — F603 Borderline personality disorder: Secondary | ICD-10-CM

## 2023-05-25 DIAGNOSIS — F909 Attention-deficit hyperactivity disorder, unspecified type: Secondary | ICD-10-CM | POA: Diagnosis not present

## 2023-05-25 NOTE — Progress Notes (Signed)
Virtual Visit via Video Note   I connected with [name] on 05/25/23 at  9:00 AM EDT by a video enabled telemedicine application and verified that I am speaking with the correct person using two identifiers.   At orientation to the IOP program, Case Manager discussed the limitations of evaluation and management by telemedicine and the availability of in person appointments. The patient expressed understanding and agreed to proceed with virtual visits throughout the duration of the program.   Location:  Patient: Patient Home Provider: OPT BH Office   History of Present Illness: Bipolar Disorder and Borderline Personality Disorder  Observations/Objective: Check In: Case Manager checked in with all participants to review discharge dates, insurance authorizations, work-related documents and needs from the treatment team regarding medications. Sabre stated needs and engaged in discussion.    Initial Therapeutic Activity: Counselor facilitated a check-in with Hailea to assess for safety, sobriety and medication compliance.  Counselor also inquired about Ndeye's current emotional ratings, as well as any significant changes in thoughts, feelings or behavior since previous check in.  [place] presented for session on time and was alert, oriented x5, with no evidence or self-report of active SI/HI or A/V H.  Sharise reported compliance with medication and denied use of alcohol or illicit substances.  Ronald reported scores of 5/10 for depression, 8/10 for anxiety, and 0/10 for anger/irritability.  Magdaline denied any recent outbursts or panic attacks.  Pearly reported that a recent success was completing PHP and deciding to continue treatment through admission into [facility].  Kawanda reported that a recent struggle was has been adjusting to some medication changes as dosage is changed by her doctor.  Yuleni reported that her goal today is to work on a Photographer for self-care.       Second Therapeutic Activity: Counselor  introduced topic of building a social support network today.  Counselor explained how this can be defined as having a having a group of healthy people in one's life you can talk to, spend time with, and get help from to improve both mental and physical health.  Counselor noted that some barriers can make it difficult to connect with other people, including the presence of anxiety or depression, or moving to an unfamiliar area.  Group members were asked to assess the current state of their support network, and identify ways that this could be improved.  Tips were given on how to address previously noted barriers, such as strengthening social skills, using relaxation techniques to reduce anxiety, scheduling social time each week, and/or exploring social events nearby which could increase chances of meeting new supports.  Members were also encouraged to consider getting closer to people they already know through suggestions such as outreaching someone by text, email or phone call if they haven't spoken in awhile, doing something nice for a friend/family member unexpectedly, and/or inviting someone over for a game/movie/dinner night.  Intervention effectiveness could not be measured, as Sania did not participate.    Assessment and Plan: Counselor recommends that [facility] remain in IOP treatment to better manage mental health symptoms, ensure stability and pursue completion of treatment plan goals. Counselor recommends adherence to crisis/safety plan, taking medications as prescribed, and following up with medical professionals if any issues arise.    Follow Up Instructions: Counselor will send Webex link for session tomorrow.  Meiah was advised to call back or seek an in-person evaluation if the symptoms worsen or if the condition fails to improve as anticipated.   Collaboration of  Care:   Medication Management AEB Dr. Eliseo Gum or Hillery Jacks, NP                                          Case Manager AEB [name], CNA   Patient/Guardian was advised Release of Information must be obtained prior to any record release in order to collaborate their care with an outside provider. Patient/Guardian was advised if they have not already done so to contact the registration department to sign all necessary forms in order for [place] to release information regarding their care.   Consent: Patient/Guardian gives verbal consent for treatment and assignment of benefits for services provided during this visit. Patient/Guardian expressed understanding and agreed to proceed.  I provided 180 minutes of non-face-to-face time during this encounter.   [name], LCSW, LCAS 05/25/23

## 2023-05-25 NOTE — Progress Notes (Signed)
Virtual Visit via Telephone Note  I connected with Shelby Gallegos on @TODAY @ at  9:00 AM EDT by telephone and verified that I am speaking with the correct person using two identifiers.  Location: Patient: at home Provider: at office   I discussed the limitations, risks, security and privacy concerns of performing an evaluation and management service by telephone and the availability of in person appointments. I also discussed with the patient that there may be a patient responsible charge related to this service. The patient expressed understanding and agreed to proceed.   I discussed the assessment and treatment plan with the patient. The patient was provided an opportunity to ask questions and all were answered. The patient agreed with the plan and demonstrated an understanding of the instructions.   The patient was advised to call back or seek an in-person evaluation if the symptoms worsen or if the condition fails to improve as anticipated.  I provided 30 minutes of non-face-to-face time during this encounter.   Shelby Gallegos, M.Ed,CNA   Patient ID: Shelby Gallegos, female   DOB: November 24, 1981, 42 y.o.   MRN: [id number] D:  As per previous CCA states:  "Shelby Gallegos is a 42yo female referred to [facility] following a [facility] discharge for a non lethal suicide attempt. She cites her stressors as the process of evicting her roommate, quitting her job yesterday, and readjusting after being d/c from [facility]. She reports slightly decreased ADLs. She reports the following psych meds: lamictal 400mg , vraylar 3mg , prazosin (unsure of dosage), adderall 20mg  3x, Xanax 0.5 PRN. 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 Prior tx history includes PHP in 2020, IOP in 2018, outpatient therapy and med man, and previous day classes at [facility] before it closed. She endorses four psychiatric hospitalizations during the following years; 1998, 2018, 2020, and  2024. She reports two total suicide attempts and states she does not remember the first and that it is suspected she had a psychotic episode during that time. She states she is diagnosed with bipolar 2, ADHD, anxiety, and PTSD. She endorses current SI without plan or intent and contracts for safety at the end of the assessment. She denies HI, AVH, NSSI, and states there are no weapons in her home. She states her uncle had bipolar disorder. She cites her best friend, church friends, and family as her supports. She will be living alone after her roommate is evicted. She states she drank two bottles of Mike's hard lemonade the night before last "to calm down." She denies previous alcohol abuse but endorses previous regular use and states she has not been drinking at all over the past several years. She reports hx of migraines and possible thyroid problems."   Patient stepped down to virtual MH-IOP from virtual PHP today (05-25-23).  C/O of increased anxiety.  "Dr. Morrie Sheldon told me not to take my ADHD medication but I am really struggling to stay focused."  Reports having some dark thoughts and nightmares, but denies any SI/HI or A/V hallucinations.  On a scale of 1-10 (10 being the worst); pt rates her depression at a 3/4 and her anxiety at a 10.  Scored 18 on PHQ-9.  Pt was so anxious that she requested to take a five minute break, while in the group today.  Pt had sent the case manager an email stating that she was having a difficult time.  "I am fighting a near panic attack...bear with  me..I'm listening to the group though."  A:  Re-oriented pt.  Discussed group expectations at length.  Case mgr will continue reiterating the expectations. Pt was advised of ROI must be obtained prior to any records release in order to collaborate her care with an outside provider.  Pt was advised if she has not already done so to contact the front desk to sign all necessary forms in order for [facility] to release info re: her care.   Consent:  Pt gives verbal consent for tx and assignment of benefits for services provided during this telehealth group process.  Pt expressed understanding and agreed to proceed. Collaboration of care:  Collaborate with Dr. Eliseo Gallegos AEB, Dr. Milagros Gallegos AEB and Shelby Stain, LCSW AEB , and her individual therapist Shelby Gallegos @ [facility]) AEB. Pt will improve her mood as evidenced by being happy again, managing her mood and coping with daily stressors for 5 out of 7 days for 60 days. R:  Pt receptive.  Shelby Gallegos, M.Ed,CNA

## 2023-05-25 NOTE — Therapy (Signed)
[facility] PARTIAL HOSPITALIZATION PROGRAM [address] [place], [place], [address] Phone: [phone]   Fax:  [phone]  Occupational Therapy Treatment Virtual Visit via Video Note  I connected with Shelby Gallegos on 05/25/23 at  8:00 AM EDT by a video enabled telemedicine application and verified that I am speaking with the correct person using two identifiers.  Location: Patient: home Provider: office   I discussed the limitations of evaluation and management by telemedicine and the availability of in person appointments. The patient expressed understanding and agreed to proceed.    The patient was advised to call back or seek an in-person evaluation if the symptoms worsen or if the condition fails to improve as anticipated.  I provided 55 minutes of non-face-to-face time during this encounter.   Patient Details  Name: Shelby Gallegos MRN: [id number] Date of Birth: 05-01-1981 No data recorded  Encounter Date: 05/24/2023   OT End of Session - 05/25/23 1735     Visit Number 17    Date for OT Re-Evaluation 06/01/23    OT Start Time 1200    OT Stop Time 1255    OT Time Calculation (min) 55 min             Past Medical History:  Diagnosis Date   ADHD (attention deficit hyperactivity disorder)    Bipolar 1 disorder (HCC)    Migraine    Suicide attempt by drug overdose (HCC) 02/15/2019    Past Surgical History:  Procedure Laterality Date   NO PAST SURGERIES     TENDON REPAIR Bilateral 02/20/2019   Procedure: TENDON REPAIR BILATERAL FOREARMS;  Surgeon: [name], MD;  Location: [facility];  Service: Orthopedics;  Laterality: Bilateral;   WOUND EXPLORATION Bilateral 02/20/2019   Procedure: WOUND EXPLORATION;  Surgeon: [name], MD;  Location: [facility];  Service: Orthopedics;  Laterality: Bilateral;    There were no vitals filed for this visit.   Subjective Assessment - 05/25/23 1734     Currently in  Pain? No/denies    Pain Score 0-No pain                Group Session:  S: Doing well today. Optimistic for the future.  O: During today's OT group session, the patient participated in an educational segment about the importance of goal-setting and the application of the SMART framework to enhance daily life, particularly focusing on ADLs and iADLs. The session began with five open-ended pre-session questions that facilitated group discussion and introspection about their current relationship with goals. Following the introduction and educational segment, participants engaged in brainstorming and group discussions to devise hypothetical SMART goals. The session concluded with five post-session questions to reinforce understanding and facilitate reflection. Throughout the session, there was a range of engagement levels noted among the participants.     A: Patient demonstrated a high level of engagement throughout the session. They actively participated in discussions, sharing personal experiences related to goal setting and challenges faced. Patient was able to clearly articulate an understanding of the SMART framework and proposed personal SMART goals related to their own ADLs with minimal assistance. They expressed enthusiasm about applying what they learned to their daily routine and appeared motivated to make changes.  OCCUPATIONAL THERAPY DISCHARGE SUMMARY  Visits from Start of Care: 17  Current functional level related to goals / functional outcomes: Pt has met all OT goals and is ready for DC at this time.    Remaining deficits:  There are no remaining OT deficits at this time.     Plan: Patient agrees to discharge.                        OT Education - 05/25/23 1735     Education Details SMART Goals              OT Short Term Goals - 05/02/23 1005       OT SHORT TERM GOAL #1   Title Pt will be educated on strategies to improve psychosocial  skills needed to participate fully in all daily, work, and leisure activities    Time 4    Period Weeks    Status met   Target Date 06/01/23      OT SHORT TERM GOAL #2   Title Pt will apply psychosocial skills and coping mechanisms to daily activities in order to function independently and reintegrate into community    Time 4    Period Weeks    Status met   Target Date 06/01/23      OT SHORT TERM GOAL #3   Title Pt will recall and/or apply 1-3 sleep hygiene strategies to improve BADL participation prior to reintegrating into community    Time 4    Period Weeks    Status met   Target Date 06/01/23      OT SHORT TERM GOAL #4   Title Pt will engage in goal setting to improve BADL/IADL participation prior to reintegrating into community    Time 4    Period Weeks    Status met   Target Date 06/01/23      OT SHORT TERM GOAL #5   Title Pt will create and/or implement functional BADL/IADL routine prior to reintegrating into community    Time 4    Period Weeks    Status met   Target Date 06/01/23                      Plan - 05/25/23 1735     Psychosocial Skills Coping Strategies;Habits;Routines and Behaviors;Interpersonal Interaction             Patient will benefit from skilled therapeutic intervention in order to improve the following deficits and impairments:       Psychosocial Skills: Coping Strategies, Habits, Routines and Behaviors, Interpersonal Interaction   Visit Diagnosis: Difficulty coping    Problem List Patient Active Problem List   Diagnosis Date Noted   Panic attacks 05/10/2023   Bipolar disorder, current episode depressed, severe (HCC) 04/15/2023   Hypothyroidism 03/30/2023   Prediabetes 03/30/2023   Nicotine vapor product user 03/30/2023   Suicide attempt [facility])    Intentional acetaminophen overdose (HCC)    History of suicide attempt 04/01/2017   Borderline personality disorder (HCC) 02/16/2017   Bipolar affective disorder,  depressed, severe (HCC) 08/21/2015   ADHD (attention deficit hyperactivity disorder) 08/21/2015   Headache, migraine 09/28/2014   Abnormal ECG 11/26/2009    Ted Mcalpine, OT 05/25/2023, 5:35 PM  Kerrin Champagne, OT   [facility] HOSPITALIZATION PROGRAM [address] [place], [place], [address] Phone: [phone]   Fax:  [phone]  Name: Shelby Gallegos MRN: [id number] Date of Birth: 1981/08/05

## 2023-05-26 ENCOUNTER — Other Ambulatory Visit (HOSPITAL_COMMUNITY): Payer: BC Managed Care – PPO

## 2023-05-26 ENCOUNTER — Other Ambulatory Visit (HOSPITAL_COMMUNITY): Payer: BC Managed Care – PPO | Admitting: Licensed Clinical Social Worker

## 2023-05-26 DIAGNOSIS — F909 Attention-deficit hyperactivity disorder, unspecified type: Secondary | ICD-10-CM | POA: Diagnosis not present

## 2023-05-26 DIAGNOSIS — F314 Bipolar disorder, current episode depressed, severe, without psychotic features: Secondary | ICD-10-CM

## 2023-05-26 DIAGNOSIS — Z9151 Personal history of suicidal behavior: Secondary | ICD-10-CM | POA: Diagnosis not present

## 2023-05-26 DIAGNOSIS — Z7389 Other problems related to life management difficulty: Secondary | ICD-10-CM | POA: Diagnosis not present

## 2023-05-26 DIAGNOSIS — R4589 Other symptoms and signs involving emotional state: Secondary | ICD-10-CM | POA: Diagnosis not present

## 2023-05-26 DIAGNOSIS — F319 Bipolar disorder, unspecified: Secondary | ICD-10-CM | POA: Diagnosis not present

## 2023-05-26 DIAGNOSIS — F603 Borderline personality disorder: Secondary | ICD-10-CM

## 2023-05-26 MED ORDER — SERTRALINE HCL 25 MG PO TABS: 25.0000 mg | ORAL_TABLET | Freq: Every day | ORAL | 0 refills | Status: DC

## 2023-05-26 NOTE — Psych (Signed)
Virtual Visit via Video Note  I connected with [name] on 05/19/23 at  9:00 AM EDT by a video enabled telemedicine application and verified that I am speaking with the correct person using two identifiers.  Location: Patient: patient home Provider: clinical home office   I discussed the limitations of evaluation and management by telemedicine and the availability of in person appointments. The patient expressed understanding and agreed to proceed.  I discussed the assessment and treatment plan with the patient. The patient was provided an opportunity to ask questions and all were answered. The patient agreed with the plan and demonstrated an understanding of the instructions.   The patient was advised to call back or seek an in-person evaluation if the symptoms worsen or if the condition fails to improve as anticipated.  Pt was provided 240 minutes of non-face-to-face time during this encounter.   Quinn Axe, [facility]   [facility] PHP THERAPIST PROGRESS NOTE  Zanyla Klebba [id number]  Session Time: 9:00 - 10:00  Participation Level: Active  Behavioral Response: CasualAlertDepressed  Type of Therapy: Group Therapy  Treatment Goals addressed: Coping  Progress Towards Goals: Initial  Interventions: CBT, DBT, Supportive, and Reframing  Summary: Sabre Romberger is a 42 y.o. female who presents with depression and mood symptoms.  Clinician led check-in regarding current stressors and situation, and review of patient completed daily inventory. Clinician utilized active listening and empathetic response and validated patient emotions. Clinician facilitated processing group on pertinent issues.?    Therapist Response: Patient arrived within time allowed. Patient rates her mood at a 7 on a scale of 1-10 with 10 being best. Pt states she feels "pretty good." Pt states she slept 5 hours off/on and ate 1x yesterday. Pt shares she had a good afternoon. She took a nap, went to Bible study  where she is meeting new people, talked to her aunt and a friend. Pt reports she is doing well with meeting new people and managing anxiety. Patient able to process. Patient engaged in discussion.       Session Time: 10:00 am - 11:00 am   Participation Level: Active   Behavioral Response: CasualAlertAnxious and Depressed   Type of Therapy: Group Therapy   Treatment Goals addressed: Coping   Progress Towards Goals: Progressing   Interventions: CBT, DBT, Solution Focused, Strength-based, Supportive, and Reframing   Therapist Response: Clinician introduced topic of "Positive Psychology". Group watched "Positive Psychology" Ted-Talk. Patients discussed how their "lens" of life affects the way they feel.    Therapist Response:  Pt engaged in discussion. Pt reports she relates to "moving the goal post" of success and happiness and is learning to find both with where she is currently in life.  Pt also identified with "escaping the cult of the average" and how that has effected her.           Session Time: 11:00 -12:00   Participation Level: Active   Behavioral Response: CasualAlertDepressed   Type of Therapy: Group Therapy   Treatment Goals addressed: Coping   Progress Towards Goals: Progressing   Interventions: CBT, DBT, Solution Focused, Strength-based, Supportive, and Reframing   Summary: Cln continued topic of positive psychology. Group discussed 5 strategies to help change lens. Patients identified one strategy they would be willing to try to change their "lens" for at least 21 days to create a new habit.    Therapist Response:  Pt engaged in discussion. Pt will write 3 gratitudes each day to help change her lens.  Session Time: 12:00 -1:00   Participation Level: Active   Behavioral Response: CasualAlertDepressed   Type of Therapy: Group therapy, Occupational Therapy   Treatment Goals addressed: Coping   Progress Towards Goals: Progressing    Interventions: Supportive; Psychoeducation   Summary: 12:00 - 12:50: Occupational Therapy group led by cln [name]. 12:50 - 1:00 Clinician assessed for immediate needs, medication compliance and efficacy, and safety concerns.   Therapist Response: 12:00 - 12:50: See OT note 12:50 - 1:00 pm: At check-out, patient reports no immediate concerns. Patient demonstrates progress as evidenced by continued engagement and responsiveness to treatment. Patient denies SI/HI/self-harm thoughts at the end of group.     Suicidal/Homicidal: Nowithout intent/plan  Plan: Pt will continue in PHP while working to increase mood stability, decrease depression symptoms, and increase ability to manage symptoms in a healthy manner.   Collaboration of Care: Medication Management AEB [name]  Patient/Guardian was advised Release of Information must be obtained prior to any record release in order to collaborate their care with an outside provider. Patient/Guardian was advised if they have not already done so to contact the registration department to sign all necessary forms in order for [place] to release information regarding their care.   Consent: Patient/Guardian gives verbal consent for treatment and assignment of benefits for services provided during this visit. Patient/Guardian expressed understanding and agreed to proceed.   Diagnosis: Bipolar disorder, current episode depressed, severe, without psychotic features (HCC) [F31.4]    1. Bipolar disorder, current episode depressed, severe, without psychotic features [facility])       Quinn Axe, [facility] 05/19/2023

## 2023-05-26 NOTE — Psych (Signed)
Virtual Visit via Video Note  I connected with Shelby Gallegos on 05/20/23 at  9:00 AM EDT by a video enabled telemedicine application and verified that I am speaking with the correct person using two identifiers.  Location: Patient: patient home Provider: clinical home office   I discussed the limitations of evaluation and management by telemedicine and the availability of in person appointments. The patient expressed understanding and agreed to proceed.  I discussed the assessment and treatment plan with the patient. The patient was provided an opportunity to ask questions and all were answered. The patient agreed with the plan and demonstrated an understanding of the instructions.   The patient was advised to call back or seek an in-person evaluation if the symptoms worsen or if the condition fails to improve as anticipated.  Pt was provided 240 minutes of non-face-to-face time during this encounter.   Shelby Gallegos, [facility]   [facility] PHP THERAPIST PROGRESS NOTE  Shelby Gallegos [id number]  Session Time: 9:00 - 10:00  Participation Level: Active  Behavioral Response: CasualAlertDepressed  Type of Therapy: Group Therapy  Treatment Goals addressed: Coping  Progress Towards Goals: Initial  Interventions: CBT, DBT, Supportive, and Reframing  Summary: Shelby Gallegos is a 42 y.o. female who presents with depression and mood symptoms.  Clinician led check-in regarding current stressors and situation, and review of patient completed daily inventory. Clinician utilized active listening and empathetic response and validated patient emotions. Clinician facilitated processing group on pertinent issues.?    Therapist Response: Patient arrived within time allowed. Patient rates her mood at a 7 on a scale of 1-10 with 10 being best. Pt states she feels "pretty good." Pt states she slept 6 hours off/on and ate 1.5x yesterday. Pt shares she had a good afternoon. She spent time with an old college  friend she had not seen in years. Pt reports she is happy to reconnect. Pt continues to have anxiety related to feeling awkward around others. Patient able to process. Patient engaged in discussion.       Session Time: 10:00 am - 11:00 am   Participation Level: Active   Behavioral Response: CasualAlertAnxious and Depressed   Type of Therapy: Group Therapy   Treatment Goals addressed: Coping   Progress Towards Goals: Progressing   Interventions: CBT, DBT, Solution Focused, Strength-based, Supportive, and Reframing   Therapist Response: Clinician led introduced the topic of "Radical Acceptance". Patients identified and discussed areas of life where radical acceptance would be useful.    Therapist Response:  Pt engaged in discussion. 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         Session Time: 11:00 -12:00   Participation Level: Active   Behavioral Response: CasualAlertDepressed   Type of Therapy: Group Therapy   Treatment Goals addressed: Coping   Progress Towards Goals: Progressing   Interventions: CBT, DBT, Solution Focused, Strength-based, Supportive, and Reframing   Summary: Clinician introduced "Mindfulness". The group discussed "What" and "How" skills of mindfulness.   Therapist Response:  Pt engaged in discussion. Pt reports she will try to be mindful while washing clothes and cooking to have timed practice within her every day life.         Session Time: 12:00 -1:00   Participation Level: Active   Behavioral Response: CasualAlertDepressed   Type of Therapy: Group therapy, Occupational Therapy   Treatment Goals addressed: Coping   Progress Towards Goals: Progressing   Interventions: Supportive; Psychoeducation   Summary:  12:00 - 12:50: Occupational Therapy group led by cln [name]. 12:50 - 1:00 Clinician assessed for immediate needs, medication compliance and efficacy, and safety concerns.   Therapist  Response: 12:00 - 12:50: See OT note 12:50 - 1:00 pm: At check-out, patient reports no immediate concerns. Patient demonstrates progress as evidenced by continued engagement and responsiveness to treatment. Patient denies SI/HI/self-harm thoughts at the end of group.     Suicidal/Homicidal: Nowithout intent/plan  Plan: Pt will continue in PHP while working to increase mood stability, decrease depression symptoms, and increase ability to manage symptoms in a healthy manner.   Collaboration of Care: Medication Management [name]  Patient/Guardian was advised Release of Information must be obtained prior to any record release in order to collaborate their care with an outside provider. Patient/Guardian was advised if they have not already done so to contact the registration department to sign all necessary forms in order for [place] to release information regarding their care.   Consent: Patient/Guardian gives verbal consent for treatment and assignment of benefits for services provided during this visit. Patient/Guardian expressed understanding and agreed to proceed.   Diagnosis: Bipolar affective disorder, depressed, severe (HCC) [F31.4]    1. Bipolar affective disorder, depressed, severe (HCC)       Shelby Gallegos, [facility] 05/20/2023

## 2023-05-26 NOTE — Progress Notes (Signed)
Virtual Visit via Video Note  I connected with Shelby Gallegos on 05/26/23 at  9:00 AM EDT by a video enabled telemedicine application and verified that I am speaking with the correct person using two identifiers.  Location: Patient: Home Provider: Office   I discussed the limitations of evaluation and management by telemedicine and the availability of in person appointments. The patient expressed understanding and agreed to proceed.     I discussed the assessment and treatment plan with the patient. The patient was provided an opportunity to ask questions and all were answered. The patient agreed with the plan and demonstrated an understanding of the instructions.   The patient was advised to call back or seek an in-person evaluation if the symptoms worsen or if the condition fails to improve as anticipated.   [name], MD   Psychiatric Initial Adult Assessment   Patient Identification: Shelby Gallegos MRN:  [id number] Date of Evaluation:  05/26/2023 Referral Source: PHP Chief Complaint:   Chief Complaint  Patient presents with   Anxiety   Depression   Visit Diagnosis:    ICD-10-CM   1. Borderline personality disorder (HCC)  F60.3     2. Bipolar disorder, current episode depressed, severe, without psychotic features (HCC)  F31.4 sertraline (ZOLOFT) 25 MG tablet      History of Present Illness:    Patient is being admitted 05/26/2023,  a step-down from completion of PHP.   Patient reports that she is doing "ok." Patient reports that she has had a lot of anxiety with the transition to IOP, but she finds group itself helpful. Patient reports that she did have a panic attack yesterday but was able to use coping skills, and complete her session. Patient reports that her mood is "ok" she is a bit concerned about her focus, but overall she feels she is ok. Patient denies irritability. Patient reports that she does feel like her energy is lower, and has noticed that some nights she  had some trouble falling asleep, but is sleeping well. Patient reports she thinks she is averaging 6-7h of sleep. Patient denies SI, HI, and AVH. Patient reports that she has not had to call [phone] since the last time about 1.5 weeks ago in [facility].   Patient reports her appetite is fair averaging 1-2 meals/ day.   Patient will start Zoloft 25mg  today, she has been fearful of running out. Patient reports that she has not been able to get her new refill of Vraylar, but she still has some in her home for now, and the issue is with insurance and timing. Patient is only requiring Hydroxyzine 1-2x/ day and does take it before bed. She continues to be compliant with lamictal and Prazosin. Patient reports that she is down to taking the Xanax about 2x/ week. Patient is still doing her hobbies and finding joy.   Patient reports that she is still using the NRT patches and working on titrating down on nicotine use.   Patient is in the process of looking for a PCP, her PCP is leaving.   H&P from PHP  "Shelby Gallegos is a 42 year old female presented after a recent inpatient admission. Reported she had a recent recent suicide attempt.  States she overdosed on medications.  She reports she is currently diagnosed with major depressive disorder, bipolar disorder, attention deficit disorder and posttraumatic stress disorder.  States she is currently followed by Dr. Evelene Croon at [facility].  She reports multiple inpatient admissions in the past.  Reported attending intensive outpatient programming and partial hospitalization programming.  Shelby Gallegos states she is currently prescribed Lamictal, Adderall and Prozac which she reports she has been taking and tolerating well.    She reports multiple psychosocial stressors relating to her worsening depression and anxiety.  States she had a bad break-up and feels as if her roommate was trying to manipulate her.  States her anxiety and depression has improved since her admission.   Shelby Gallegos  reported that she has family support.  States she has 3 siblings who are all close to her.  States one of her siblings passed but overall her family is close.  Denies that she is ever been married.  States she recently quit her job.  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  States she has plans to go back to working for [employer] and doors at [employer].  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  Patient to start partial hospitalization program.    During evaluation [name] is siting; she is alert/oriented x 4; calm/cooperative; and mood congruent with affect.  Patient is speaking in a clear tone at moderate volume, and normal pace; with good eye contact. Her thought process is coherent and relevant; There is no indication that she is currently responding to internal/external stimuli or experiencing delusional thought content.  Patient denies suicidal/self-harm/homicidal ideation, psychosis, and paranoia.  Patient has remained calm throughout assessment and has answered questions appropriately. "   Associated Signs/Symptoms: Depression Symptoms:   denies currently (Hypo) Manic Symptoms:   denies Anxiety Symptoms:  Excessive Worry, Panic Symptoms, Psychotic Symptoms:   denies PTSD Symptoms: Re-experiencing:  Intrusive Thoughts  Past Psychiatric History: INPT: 4x, ( SI, SI, SA in 2020, and 2024 ) SA in 2020 OPT: Dr. Evelene Croon for 23 years Therapy: yes in the past and has one currently, [name] @ [facility] Medications: Prozac as a teen had SI, cymbalta, effexor, Abilify, depakote (did not do well), caplyta (symptomatic hypotension), trileptal, Geodon,  Previous Psychotropic Medications: Yes   Substance Abuse History in the last 12 months:  No.  Consequences of Substance Abuse: NA  Past Medical History:  Past Medical History:  Diagnosis Date   ADHD (attention deficit hyperactivity  disorder)    Bipolar 1 disorder (HCC)    Migraine    Suicide attempt by drug overdose (HCC) 02/15/2019    Past Surgical History:  Procedure Laterality Date   NO PAST SURGERIES     TENDON REPAIR Bilateral 02/20/2019   Procedure: TENDON REPAIR BILATERAL FOREARMS;  Surgeon: [name], MD;  Location: [facility];  Service: Orthopedics;  Laterality: Bilateral;   WOUND EXPLORATION Bilateral 02/20/2019   Procedure: WOUND EXPLORATION;  Surgeon: [name], MD;  Location: [facility];  Service: Orthopedics;  Laterality: Bilateral;    Family Psychiatric History: P Uncle: Bipolar A lot of fmaily with Etoh use d/o   Family History:  Family History  Problem Relation Age of Onset   Atrial fibrillation Mother    Bipolar disorder Paternal Uncle    Alcohol abuse Paternal Uncle    Depression Paternal Uncle    OCD Paternal Uncle     Social History:   Social History   Socioeconomic History   Marital status: Single    Spouse name: Not on file   Number of children: 0   Years of education: Not on file   Highest education level: Bachelor's degree (  e.g., BA, AB, BS)  Occupational History   Not on file  Tobacco Use   Smoking status: Every Day    Types: Cigarettes, E-cigarettes   Smokeless tobacco: Never   Tobacco comments:    Reports she vaps for past year.   Vaping Use   Vaping Use: Every day   Start date: 11/29/2021   Substances: Nicotine, Flavoring  Substance and Sexual Activity   Alcohol use: Not Currently    Comment: Reported last use on 04/14/23 when she stated trying to overdose, none since.   Drug use: No   Sexual activity: Not Currently    Birth control/protection: None  Other Topics Concern   Not on file  Social History Narrative   Not on file   Social Determinants of Health   Financial Resource Strain: Low Risk  (03/30/2023)   Overall Financial Resource Strain (CARDIA)    Difficulty of Paying Living Expenses: Not very hard  Food Insecurity:  No Food Insecurity (04/15/2023)   Hunger Vital Sign    Worried About Running Out of Food in the Last Year: Never true    Ran Out of Food in the Last Year: Never true  Transportation Needs: No Transportation Needs (04/15/2023)   PRAPARE - Administrator, Civil Service (Medical): No    Lack of Transportation (Non-Medical): No  Physical Activity: Insufficiently Active (03/30/2023)   Exercise Vital Sign    Days of Exercise per Week: 2 days    Minutes of Exercise per Session: 20 min  Stress: Stress Concern Present (03/30/2023)   [employer] of Occupational Health - Occupational Stress Questionnaire    Feeling of Stress : Rather much  Social Connections: Moderately Integrated (03/30/2023)   Social Connection and Isolation Panel [NHANES]    Frequency of Communication with Friends and Family: More than three times a week    Frequency of Social Gatherings with Friends and Family: More than three times a week    Attends Religious Services: More than 4 times per year    Active Member of [employer] or Organizations: Yes    Attends Engineer, structural: More than 4 times per year    Marital Status: Never married    Additional Social History:  - living alone, had a roommate, but currently suing her - enjoys Psychiatric nurse, Haematologist and other crafts  Allergies:   Allergies  Allergen Reactions   Onion     Triggers migraines    Metabolic Disorder Labs: Lab Results  Component Value Date   HGBA1C 5.8 (H) 04/14/2023   MPG 119.76 04/14/2023   MPG 114.02 02/19/2019   No results found for: "PROLACTIN" Lab Results  Component Value Date   CHOL 224 (H) 04/14/2023   TRIG 142 04/14/2023   HDL 55 04/14/2023   CHOLHDL 4.1 04/14/2023   VLDL 28 04/14/2023   LDLCALC 141 (H) 04/14/2023   LDLCALC 130 (H) 03/31/2023   Lab Results  Component Value Date   TSH 3.100 04/14/2023    Therapeutic Level Labs: No results found for: "LITHIUM" No results found  for: "CBMZ" No results found for: "VALPROATE"  Current Medications: Current Outpatient Medications  Medication Sig Dispense Refill   ALPRAZolam (XANAX) 0.5 MG tablet Take 0.5 mg by mouth daily as needed for anxiety.     amphetamine-dextroamphetamine (ADDERALL) 20 MG tablet Take 20 mg by mouth in the morning, at noon, and at bedtime.     cariprazine (VRAYLAR) 3 MG capsule Take 1 capsule (3 mg  total) by mouth every evening. 30 capsule 0   cyanocobalamin (VITAMIN B12) 1000 MCG/ML injection Inject 1 mL (1,000 mcg total) into the muscle once for 1 dose. Inject 1 mL into the muscle once a week for 3 weeks and then once monthly 4 mL 5   Erenumab-aooe (AIMOVIG) 140 MG/ML SOAJ Inject 140 mg into the skin every 30 (thirty) days. 1.12 mL 11   hydrOXYzine (ATARAX) 10 MG tablet Take 1 tablet (10 mg total) by mouth daily. 90 tablet 0   lamoTRIgine (LAMICTAL) 200 MG tablet Take 400 mg by mouth at bedtime.     nicotine (NICODERM CQ - DOSED IN MG/24 HOURS) 14 mg/24hr patch Place 1 patch (14 mg total) onto the skin daily. 28 patch 0   prazosin (MINIPRESS) 1 MG capsule Take 1 capsule (1 mg total) by mouth at bedtime. 30 capsule 0   Rimegepant Sulfate (NURTEC) 75 MG TBDP Take 75 mg by mouth as needed (For migraines).     sertraline (ZOLOFT) 25 MG tablet Take 1 tablet (25 mg total) by mouth at bedtime. 30 tablet 0   SUMAtriptan 6 MG/0.5ML SOAJ Inject 6 mg into the skin as needed. (Patient taking differently: Inject 6 mg into the skin every 2 (two) hours as needed (For migraines or headaches).) 15 mL 3   Ubrogepant (UBRELVY) 100 MG TABS Take 1 tablet (100 mg total) by mouth as needed. 10 tablet 3   No current facility-administered medications for this visit.    Psychiatric Specialty Exam: Review of Systems  Psychiatric/Behavioral:  Negative for dysphoric mood, hallucinations, sleep disturbance and suicidal ideas. The patient is nervous/anxious.     There were no vitals taken for this visit.There is no height or  weight on file to calculate BMI.  General Appearance: Fairly Groomed  Eye Contact:  Good  Speech:  Clear and Coherent  Volume:  Normal  Mood:  Euthymic  Affect:  Appropriate  Thought Process:  Coherent  Orientation:  Full (Time, Place, and Person)  Thought Content:  Logical  Suicidal Thoughts:  No  Homicidal Thoughts:  No  Memory:  Immediate;   Good Recent;   Good  Judgement:  Other:  improved  Insight:  Fair  Psychomotor Activity:  Normal  Concentration:  Concentration: Good  Recall:  Good  Fund of Knowledge:Good  Language: Good  Akathisia:  No  Handed:    AIMS (if indicated):  not done  Assets:  Communication Skills Desire for Improvement Financial Resources/Insurance Housing Resilience Social Support Vocational/Educational  ADL's:  Intact  Cognition: WNL  Sleep:  Good   Screenings: AIMS    Flowsheet Row [facility]  AIMS Total Score 0      GAD-7    Flowsheet Row Counselor from 04/29/2023 in BEHAVIORAL HEALTH PARTIAL HOSPITALIZATION PROGRAM  Total GAD-7 Score 19      PHQ2-9    Flowsheet Row Counselor from 05/25/2023 in BEHAVIORAL HEALTH INTENSIVE PSYCH Counselor from 04/29/2023 in BEHAVIORAL HEALTH PARTIAL HOSPITALIZATION PROGRAM Counselor from 04/26/2023 in BEHAVIORAL HEALTH PARTIAL HOSPITALIZATION PROGRAM Office Visit from 03/30/2023 in [facility] Office Visit from 01/14/2022 in [facility]  PHQ-2 Total Score 4 3 4 3 2   PHQ-9 Total Score 18 16 19 5 6       Flowsheet Row Counselor from 05/25/2023 in BEHAVIORAL HEALTH INTENSIVE PSYCH Counselor from 04/29/2023 in BEHAVIORAL HEALTH PARTIAL HOSPITALIZATION PROGRAM Counselor from 04/26/2023 in BEHAVIORAL HEALTH PARTIAL HOSPITALIZATION PROGRAM  C-SSRS RISK CATEGORY Error: Question 6 not populated No Risk High Risk       Assessment and Plan: Will continue to recommend patient increase  Zoloft to 25mg  as she continues to have some residual anxiety but is managing better than before. This may also help patient fall asleep faster.  Borderline personality disorder Bipolar disorder, current episode depressed GAD - Increase Zoloft to 25mg  at bedtime,  we will continue to monitor for symptoms of hypomania - Continue Lamictal 400 mg nightly - Continue Vraylar 3 mg nightly - Continue Xanax 0.5 mg daily as needed - Continue hydroxyzine 10 mg 3 times daily  - Continue prazosin to 1mg  QHS   ADHD- stable - CBT learned skills   Nicotine vapor product user - Continue NRT patch 14mg , patient will also work on oral fixation    Collaboration of Care:   Patient/Guardian was advised Release of Information must be obtained prior to any record release in order to collaborate their care with an outside provider. Patient/Guardian was advised if they have not already done so to contact the registration department to sign all necessary forms in order for [place] to release information regarding their care.   Consent: Patient/Guardian gives verbal consent for treatment and assignment of benefits for services provided during this visit. Patient/Guardian expressed understanding and agreed to proceed.    PGY-3 [name], MD 6/27/202410:50 AM

## 2023-05-26 NOTE — Progress Notes (Signed)
Virtual Visit via Video Note   I connected with Shelby Gallegos on 05/26/23 at  9:00 AM EDT by a video enabled telemedicine application and verified that I am speaking with the correct person using two identifiers.   At orientation to the IOP program, Case Manager discussed the limitations of evaluation and management by telemedicine and the availability of in person appointments. The patient expressed understanding and agreed to proceed with virtual visits throughout the duration of the program.   Location:  Patient: Patient Home Provider: OPT BH Office   History of Present Illness: Bipolar Disorder and Borderline Personality Disorder  Observations/Objective: Check In: Case Manager checked in with all participants to review discharge dates, insurance authorizations, work-related documents and needs from the treatment team regarding medications. Shelby Gallegos stated needs and engaged in discussion.    Initial Therapeutic Activity: Counselor facilitated a check-in with Shelby Gallegos to assess for safety, sobriety and medication compliance.  Counselor also inquired about Shelby Gallegos's current emotional ratings, as well as any significant changes in thoughts, feelings or behavior since previous check in.  Shelby Gallegos presented for session on time and was alert, oriented x5, with no evidence or self-report of active SI/HI or A/V H.  Shelby Gallegos reported compliance with medication and denied use of alcohol or illicit substances.  Shelby Gallegos reported scores of 3/10 for depression, 6/10 for anxiety, and 0/10 for anger/irritability.  Shelby Gallegos denied any recent outbursts.  Shelby Gallegos reported that a recent success was going out to bible study last night, which she enjoyed.  Shelby Gallegos reported that a recent struggle was having a panic attack for the first time in several weeks yesterday.  Shelby Gallegos reported that her goal today is to do more work on her Lego set, and clean up around the house.       Second Therapeutic Activity: Psycho-educational portion of group was  provided by [name], Interior and spatial designer of community education with [employer].  [name] provided information on history of her local agency, mission statement, and the variety of unique services offered which group members might find beneficial to engage in, including both virtual and in-person support groups, as well as peer support program for mentoring.  [name] offered time to answer member's questions regarding services and encouraged them to consider utilizing these services to assist in working towards their individual wellness goals.  Intervention effectiveness could not be measured, as client did not participate.    Third Therapeutic Activity: Counselor introduced topic of stress management today.  Counselor provided definition of stress as feeling tense, overwhelmed, worn out, and/or exhausted, and noted that in small amounts, stress can be motivating until things become too overwhelming to manage.  Counselor also explained how stress can be acute (brief but intense) or chronic (long-lasting) and this can impact the severity of symptoms one can experience in the physical, emotional, and behavioral categories.  Counselor inquired about members' specific stressors, how long they have been prevalent, and the various symptoms that tend to manifest as a result.  Counselor also offered several stress management strategies to help improve members' coping ability, including journaling, gratitude practice, relaxation techniques, and time management tips.  Counselor also explained that research has shown a strong support network composed of trusted family, friends, or community members can increase resilience in times of stress, and inquired about who members can reach out to for help in managing stressors.  Counselor encouraged members to consider discussing stressor 'red flags' with their close supports that can be monitored and strategies for assisting them in times of  crisis.  Intervention was  effective, as evidenced by Shelby Gallegos actively participating in discussion on subject, reporting that her most significant stressors include changing jobs, money worries, and legal issues.  Shelby Gallegos was able to identify several warning signs related to stress, including anxiety, loneliness, lack of motivation, and depression.  Shelby Gallegos also expressed receptiveness to several stress management strategies practiced today in session, including practicing deep breathing exercise, maintaining a stress tracker in her journal to build insight into stressors, and practicing progressive muscle relaxation exercise.     Assessment and Plan: Counselor recommends that Shelby Gallegos remain in IOP treatment to better manage mental health symptoms, ensure stability and pursue completion of treatment plan goals. Counselor recommends adherence to crisis/safety plan, taking medications as prescribed, and following up with medical professionals if any issues arise.    Follow Up Instructions: Counselor will send Webex link for session tomorrow.  Shelby Gallegos was advised to call back or seek an in-person evaluation if the symptoms worsen or if the condition fails to improve as anticipated.   Collaboration of Care:   Medication Management AEB Dr. Eliseo Gum or Hillery Jacks, NP                                          Case Manager AEB [name], CNA   Patient/Guardian was advised Release of Information must be obtained prior to any record release in order to collaborate their care with an outside provider. Patient/Guardian was advised if they have not already done so to contact the registration department to sign all necessary forms in order for [place] to release information regarding their care.   Consent: Patient/Guardian gives verbal consent for treatment and assignment of benefits for services provided during this visit. Patient/Guardian expressed understanding and agreed to proceed.  I provided 180 minutes of non-face-to-face time during this  encounter.   [name], [place], LCAS 05/26/23

## 2023-05-27 ENCOUNTER — Other Ambulatory Visit (HOSPITAL_COMMUNITY): Payer: BC Managed Care – PPO | Admitting: Licensed Clinical Social Worker

## 2023-05-27 DIAGNOSIS — F314 Bipolar disorder, current episode depressed, severe, without psychotic features: Secondary | ICD-10-CM

## 2023-05-27 DIAGNOSIS — F909 Attention-deficit hyperactivity disorder, unspecified type: Secondary | ICD-10-CM | POA: Diagnosis not present

## 2023-05-27 DIAGNOSIS — Z7389 Other problems related to life management difficulty: Secondary | ICD-10-CM | POA: Diagnosis not present

## 2023-05-27 DIAGNOSIS — R4589 Other symptoms and signs involving emotional state: Secondary | ICD-10-CM | POA: Diagnosis not present

## 2023-05-27 DIAGNOSIS — Z9151 Personal history of suicidal behavior: Secondary | ICD-10-CM | POA: Diagnosis not present

## 2023-05-27 DIAGNOSIS — F319 Bipolar disorder, unspecified: Secondary | ICD-10-CM | POA: Diagnosis not present

## 2023-05-27 DIAGNOSIS — F603 Borderline personality disorder: Secondary | ICD-10-CM

## 2023-05-27 NOTE — Progress Notes (Signed)
Virtual Visit via Video Note   I connected with [name] on 05/27/23 at  9:00 AM EDT by a video enabled telemedicine application and verified that I am speaking with the correct person using two identifiers.   At orientation to the IOP program, Case Manager discussed the limitations of evaluation and management by telemedicine and the availability of in person appointments. The patient expressed understanding and agreed to proceed with virtual visits throughout the duration of the program.   Location:  Patient: Patient Home Provider: Clinical Home Office   History of Present Illness: Bipolar Disorder and Borderline Personality Disorder  Observations/Objective: Check In: Case Manager checked in with all participants to review discharge dates, insurance authorizations, work-related documents and needs from the treatment team regarding medications. Sierrah stated needs and engaged in discussion.    Initial Therapeutic Activity: Counselor facilitated a check-in with Kyliee to assess for safety, sobriety and medication compliance.  Counselor also inquired about Volanda's current emotional ratings, as well as any significant changes in thoughts, feelings or behavior since previous check in.  Mulberry presented for session on time and was alert, oriented x5, with no evidence or self-report of active SI/HI or A/V H.  Makalia reported compliance with medication and denied use of alcohol or illicit substances.  Alanta reported scores of 4/10 for depression, 6/10 for anxiety, and 0/10 for anger/irritability.  Amilia denied any recent outbursts or panic attacks.  Shalayla reported that a recent success was spending time yesterday journaling, and practicing guided meditation in order to improve her mood.  Blanca denied any new struggles.  Mateya reported that her goal today is to celebrate her nephew's birthday with family.       Second Therapeutic Activity: Counselor covered topic of distress tolerance skills today.   Counselor utilized a DBT handout which explained how distressing situations don't always have quick solutions, so the only choice is to sit with uncomfortable emotions until they pass.  Counselor offered the IMPROVE acronym as a solution to this problem, which outlined various skills (i.e. Imagery, Meaning, Prayer, Relaxation, 'One thing in the moment', Vacation, and Encouragement) that could be explored in order to improve ability to tolerate discomfort.  Counselor tasked members with identifying personalized strategies for each category which could have been implemented to handle a recent challenge more effectively.  Intervention was effective, as evidenced by Harvin Hazel actively engaging in discussion on subject, reporting that she believes going to court can be distressing for her.  Ericha was able to identify several strategies for handling a similar struggle in the future, including recognizing how facing this challenge well could help her let go of a toxic friendship to move forward, practicing deep breathing, listening to calming music before the court appearance, or going to the movies afterward to take her mind off things.  Johniyah stated "This is very helpful".  Assessment and Plan: Counselor recommends that [facility] remain in IOP treatment to better manage mental health symptoms, ensure stability and pursue completion of treatment plan goals. Counselor recommends adherence to crisis/safety plan, taking medications as prescribed, and following up with medical professionals if any issues arise.    Follow Up Instructions: Counselor will send Webex link for session tomorrow.  Hortense was advised to call back or seek an in-person evaluation if the symptoms worsen or if the condition fails to improve as anticipated.   Collaboration of Care:   Medication Management AEB Dr. Eliseo Gum or Hillery Jacks, NP  Case Manager AEB [name], CNA   Patient/Guardian was advised  Release of Information must be obtained prior to any record release in order to collaborate their care with an outside provider. Patient/Guardian was advised if they have not already done so to contact the registration department to sign all necessary forms in order for [place] to release information regarding their care.   Consent: Patient/Guardian gives verbal consent for treatment and assignment of benefits for services provided during this visit. Patient/Guardian expressed understanding and agreed to proceed.  I provided 165 minutes of non-face-to-face time during this encounter.   [name], [place], LCAS 05/27/23

## 2023-05-30 ENCOUNTER — Telehealth (HOSPITAL_COMMUNITY): Payer: Self-pay | Admitting: Psychiatry

## 2023-05-30 ENCOUNTER — Other Ambulatory Visit (HOSPITAL_COMMUNITY): Payer: BC Managed Care – PPO | Attending: Psychiatry | Admitting: Psychiatry

## 2023-05-30 DIAGNOSIS — Z9151 Personal history of suicidal behavior: Secondary | ICD-10-CM | POA: Insufficient documentation

## 2023-05-30 DIAGNOSIS — F603 Borderline personality disorder: Secondary | ICD-10-CM | POA: Diagnosis not present

## 2023-05-30 DIAGNOSIS — F4312 Post-traumatic stress disorder, chronic: Secondary | ICD-10-CM | POA: Insufficient documentation

## 2023-05-30 DIAGNOSIS — F314 Bipolar disorder, current episode depressed, severe, without psychotic features: Secondary | ICD-10-CM

## 2023-05-30 DIAGNOSIS — F411 Generalized anxiety disorder: Secondary | ICD-10-CM | POA: Insufficient documentation

## 2023-05-30 NOTE — Psych (Signed)
Virtual Visit via Video Note  I connected with Shelby Gallegos on 05/03/23 at  9:00 AM EDT by a video enabled telemedicine application and verified that I am speaking with the correct person using two identifiers.  Location: Patient: patient home Provider: clinical home office   I discussed the limitations of evaluation and management by telemedicine and the availability of in person appointments. The patient expressed understanding and agreed to proceed.  I discussed the assessment and treatment plan with the patient. The patient was provided an opportunity to ask questions and all were answered. The patient agreed with the plan and demonstrated an understanding of the instructions.   The patient was advised to call back or seek an in-person evaluation if the symptoms worsen or if the condition fails to improve as anticipated.  Pt was provided 240 minutes of non-face-to-face time during this encounter.   [name], LCSW   [facility] PHP THERAPIST PROGRESS NOTE  Saryn Panick [id number]  Session Time: 9:00 - 10:00  Participation Level: Active  Behavioral Response: CasualAlertDepressed  Type of Therapy: Group Therapy  Treatment Goals addressed: Coping  Progress Towards Goals: Progressing  Interventions: CBT, DBT, Supportive, and Reframing  Summary: Shelby Gallegos is a 42 y.o. female who presents with depression and mood symptoms.  Clinician led check-in regarding current stressors and situation, and review of patient completed daily inventory. Clinician utilized active listening and empathetic response and validated patient emotions. Clinician facilitated processing group on pertinent issues.?    Therapist Response: Patient arrived within time allowed. Patient rates her mood at a 7 on a scale of 1-10 with 10 being best. Pt states she feels "unsettled." Pt states she slept 5 hours off/on and ate 1x yesterday. Pt reports she took a 5 hr nap after group. Pt shares she woke up with SI  around 1am and did not feel safe so she called 988 and they helped her. Pt reports talking to someone for approx an hour and being able to go back to sleep not feeling unsafe. Pt reports continued anxiety about upcoming court date and having to see her ex-roommate. Pt reports passive SI and denies plan or intent.  Patient able to process. Patient engaged in discussion.         Session Time: 10:00 am - 11:00 am   Participation Level: Active   Behavioral Response: CasualAlertDepressed   Type of Therapy: Group Therapy   Treatment Goals addressed: Coping   Progress Towards Goals: Progressing   Interventions: CBT, DBT, Solution Focused, Strength-based, Supportive, and Reframing   Therapist Response: Cln led discussion on healthy aggression substitutes. Cln discussed the benefits to discharging energy and adrenaline when feeling "revved up" in emotion and the importance of balancing that discharge with safety and lack if negative consequences. Group brainstormed different ways to channel aggression in a healthy way and shared ways that have worked for them in the past.  Therapist Response:  Pt engaged in discussion and identifies 3 options to try.          Session Time: 11:00 -12:00   Participation Level: Active   Behavioral Response: CasualAlertDepressed   Type of Therapy: Group Therapy   Treatment Goals addressed: Coping   Progress Towards Goals: Progressing   Interventions: CBT, DBT, Solution Focused, Strength-based, Supportive, and Reframing   Summary: Cln led discussion on decision making and how to apply logic to fears. Group viewed TED talk "Why you should define your fears not your goals" to aid discussion. Group discussed ways to  consider how we can address fears and make them manageable.    Therapist Response:  Pt engaged in discussion and practices decision making model with group.        Session Time: 12:00 -1:00   Participation Level: Active   Behavioral  Response: CasualAlertDepressed   Type of Therapy: Group therapy, Occupational Therapy   Treatment Goals addressed: Coping   Progress Towards Goals: Progressing   Interventions: Supportive; Psychoeducation   Summary: 12:00 - 12:50: Occupational Therapy group led by cln [name]. 12:50 - 1:00 Clinician assessed for immediate needs, medication compliance and efficacy, and safety concerns.   Therapist Response: 12:00 - 12:50: See OT note 12:50 - 1:00 pm: At check-out, patient reports no immediate concerns. Patient demonstrates progress as evidenced by continued engagement and responsiveness to treatment. Patient denies SI/HI/self-harm thoughts at the end of group.     Suicidal/Homicidal: Nowithout intent/plan  Plan: Pt will continue in PHP while working to increase mood stability, decrease depression symptoms, and increase ability to manage symptoms in a healthy manner.   Collaboration of Care: Medication Management AEB [name]  Patient/Guardian was advised Release of Information must be obtained prior to any record release in order to collaborate their care with an outside provider. Patient/Guardian was advised if they have not already done so to contact the registration department to sign all necessary forms in order for [place] to release information regarding their care.   Consent: Patient/Guardian gives verbal consent for treatment and assignment of benefits for services provided during this visit. Patient/Guardian expressed understanding and agreed to proceed.   Diagnosis: Bipolar disorder, current episode depressed, severe, without psychotic features (HCC) [F31.4]    1. Bipolar disorder, current episode depressed, severe, without psychotic features (HCC)   2. Borderline personality disorder (HCC)       [name], LCSW

## 2023-05-30 NOTE — Psych (Signed)
Virtual Visit via Video Note  I connected with Shelby Gallegos on 05/04/23 at  9:00 AM EDT by a video enabled telemedicine application and verified that I am speaking with the correct person using two identifiers.  Location: Patient: patient home Provider: clinical home office   I discussed the limitations of evaluation and management by telemedicine and the availability of in person appointments. The patient expressed understanding and agreed to proceed.  I discussed the assessment and treatment plan with the patient. The patient was provided an opportunity to ask questions and all were answered. The patient agreed with the plan and demonstrated an understanding of the instructions.   The patient was advised to call back or seek an in-person evaluation if the symptoms worsen or if the condition fails to improve as anticipated.  Pt was provided 240 minutes of non-face-to-face time during this encounter.   [name], LCSW   [facility] PHP THERAPIST PROGRESS NOTE  Shelby Gallegos [id number]  Session Time: 9:00 - 10:00  Participation Level: Active  Behavioral Response: CasualAlertDepressed  Type of Therapy: Group Therapy  Treatment Goals addressed: Coping  Progress Towards Goals: Progressing  Interventions: CBT, DBT, Supportive, and Reframing  Summary: Shelby Gallegos is a 42 y.o. female who presents with depression and mood symptoms.  Clinician led check-in regarding current stressors and situation, and review of patient completed daily inventory. Clinician utilized active listening and empathetic response and validated patient emotions. Clinician facilitated processing group on pertinent issues.?    Therapist Response: Patient arrived within time allowed. Patient rates her mood at a 8 on a scale of 1-10 with 10 being best. Pt states she feels "chill." Pt states she slept 7 hours and ate 1x yesterday. Pt reports she had a panic attack yesterday and is unable to note a trigger. Pt  states later in the day she ran errands and talked to her aunt. Pt reports feeling positive about a recent medication change. Pt reports passive SI and denies plan or intent.  Patient able to process. Patient engaged in discussion.         Session Time: 10:00 am - 11:00 am   Participation Level: Active   Behavioral Response: CasualAlertDepressed   Type of Therapy: Group Therapy   Treatment Goals addressed: Coping   Progress Towards Goals: Progressing   Interventions: CBT, DBT, Solution Focused, Strength-based, Supportive, and Reframing   Therapist Response: Cln led processing group for pt's current struggles. Group members shared stressors and provided support and feedback. Cln brought in topics of boundaries, healthy relationships, and unhealthy thought processes to inform discussion.    Therapist Response: Pt able to process and provide support to group.          Session Time: 11:00 -12:00   Participation Level: Active   Behavioral Response: CasualAlertDepressed   Type of Therapy: Group Therapy, Spiritual Care   Treatment Goals addressed: Coping   Progress Towards Goals: Progressing   Interventions: Supportive, Education   Summary:  Shelby Gallegos, Chaplain, led group.   Therapist Response: Pt participated        Session Time: 12:00 -1:00   Participation Level: Active   Behavioral Response: CasualAlertDepressed   Type of Therapy: Group therapy, Occupational Therapy   Treatment Goals addressed: Coping   Progress Towards Goals: Progressing   Interventions: Supportive; Psychoeducation   Summary: 12:00 - 12:50: Occupational Therapy group led by cln [name]. 12:50 - 1:00 Clinician assessed for immediate needs, medication compliance and efficacy, and safety concerns.   Therapist Response:  12:00 - 12:50: See OT note 12:50 - 1:00 pm: At check-out, patient reports no immediate concerns. Patient demonstrates progress as evidenced by continued engagement and  responsiveness to treatment. Patient denies SI/HI/self-harm thoughts at the end of group.     Suicidal/Homicidal: Nowithout intent/plan  Plan: Pt will continue in PHP while working to increase mood stability, decrease depression symptoms, and increase ability to manage symptoms in a healthy manner.   Collaboration of Care: Medication Management AEB [name]  Patient/Guardian was advised Release of Information must be obtained prior to any record release in order to collaborate their care with an outside provider. Patient/Guardian was advised if they have not already done so to contact the registration department to sign all necessary forms in order for [place] to release information regarding their care.   Consent: Patient/Guardian gives verbal consent for treatment and assignment of benefits for services provided during this visit. Patient/Guardian expressed understanding and agreed to proceed.   Diagnosis: Bipolar disorder, current episode depressed, severe, without psychotic features (HCC) [F31.4]    1. Bipolar disorder, current episode depressed, severe, without psychotic features (HCC)   2. Borderline personality disorder (HCC)       [name], LCSW

## 2023-05-30 NOTE — Psych (Signed)
Virtual Visit via Video Note  I connected with Shelby Gallegos on 05/17/23 at  9:00 AM EDT by a video enabled telemedicine application and verified that I am speaking with the correct person using two identifiers.  Location: Patient: patient home Provider: clinical home office   I discussed the limitations of evaluation and management by telemedicine and the availability of in person appointments. The patient expressed understanding and agreed to proceed.  I discussed the assessment and treatment plan with the patient. The patient was provided an opportunity to ask questions and all were answered. The patient agreed with the plan and demonstrated an understanding of the instructions.   The patient was advised to call back or seek an in-person evaluation if the symptoms worsen or if the condition fails to improve as anticipated.  Pt was provided 240 minutes of non-face-to-face time during this encounter.   [name], LCSW   [facility] PHP THERAPIST PROGRESS NOTE  Shelby Gallegos [id number]  Session Time: 9:00 - 10:00  Participation Level: Active  Behavioral Response: CasualAlertDepressed  Type of Therapy: Group Therapy  Treatment Goals addressed: Coping  Progress Towards Goals: Progressing  Interventions: CBT, DBT, Supportive, and Reframing  Summary: Shelby Gallegos is a 42 y.o. female who presents with depression and mood symptoms.  Clinician led check-in regarding current stressors and situation, and review of patient completed daily inventory. Clinician utilized active listening and empathetic response and validated patient emotions. Clinician facilitated processing group on pertinent issues.?    Therapist Response: Patient arrived within time allowed. Patient rates her mood at a 7 on a scale of 1-10 with 10 being best. Pt states she feels "anxious." Pt states she slept 5 hours and ate 1x yesterday. Pt reports she felt restless yesterday. Pt states she went to the movies and  getting out of the house helped. Pt reports having a flashback nightmare and waking up in a panic. Pt reports using distraction skills to manage.  Pt reports "dark thoughts" and denies SI. Patient able to process. Patient engaged in discussion.         Session Time: 10:00 am - 11:00 am   Participation Level: Active   Behavioral Response: CasualAlertDepressed   Type of Therapy: Group Therapy   Treatment Goals addressed: Coping   Progress Towards Goals: Progressing   Interventions: CBT, DBT, Solution Focused, Strength-based, Supportive, and Reframing   Therapist Response: Cln continued topic of DBT distress tolerance skills. Cln introduced STOP skill and group discussed ways they could apply the STOP skill in their every day life.   Therapist Response:  Pt engaged in discussion and identifies when to apply STOP in their daily life.        Session Time: 11:00 -12:00   Participation Level: Active   Behavioral Response: CasualAlertDepressed   Type of Therapy: Group Therapy   Treatment Goals addressed: Coping   Progress Towards Goals: Progressing   Interventions: CBT, DBT, Solution Focused, Strength-based, Supportive, and Reframing   Summary: Cln continued topic of DBT distress tolerance skills and the ACCEPTS distraction skill. Group reviewed E-P-T-S skills and discussed how they can practice them in their every day life.    Therapist Response:  Pt engaged in discussion and is able to state ways to apply these skills.        Session Time: 12:00 -1:00   Participation Level: Active   Behavioral Response: CasualAlertDepressed   Type of Therapy: Group therapy, Occupational Therapy   Treatment Goals addressed: Coping   Progress Towards Goals: Progressing  Interventions: Supportive; Psychoeducation   Summary: 12:00 - 12:50: Occupational Therapy group led by cln [name]. 12:50 - 1:00 Clinician assessed for immediate needs, medication compliance and efficacy, and  safety concerns.   Therapist Response: 12:00 - 12:50: See OT note 12:50 - 1:00 pm: At check-out, patient reports no immediate concerns. Patient demonstrates progress as evidenced by continued engagement and responsiveness to treatment. Patient denies SI/HI/self-harm thoughts at the end of group.     Suicidal/Homicidal: Nowithout intent/plan  Plan: Pt will continue in PHP while working to increase mood stability, decrease depression symptoms, and increase ability to manage symptoms in a healthy manner.   Collaboration of Care: Medication Management AEB [name]  Patient/Guardian was advised Release of Information must be obtained prior to any record release in order to collaborate their care with an outside provider. Patient/Guardian was advised if they have not already done so to contact the registration department to sign all necessary forms in order for [place] to release information regarding their care.   Consent: Patient/Guardian gives verbal consent for treatment and assignment of benefits for services provided during this visit. Patient/Guardian expressed understanding and agreed to proceed.   Diagnosis: Bipolar disorder, current episode depressed, severe, without psychotic features (HCC) [F31.4]    1. Bipolar disorder, current episode depressed, severe, without psychotic features (HCC)   2. Borderline personality disorder (HCC)       [name], LCSW
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
## 2023-05-30 NOTE — Telephone Encounter (Signed)
D:  Pt phoned inquiring about her insurance authorization.  "How many days did they give me?"  Pt also mentioned that she received news from her family that the four of them will be going to the beach on 06-19-23, without pt, since she's in group.  A:  Provided pt with support.  Informed pt that her d/c date would be 06-15-23.  Encouraged lots of structure, if pt decides not to go to the beach.  R:  Pt receptive.
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
## 2023-05-30 NOTE — Psych (Signed)
Virtual Visit via Video Note  I connected with Shelby Gallegos on 05/05/23 at  9:00 AM EDT by a video enabled telemedicine application and verified that I am speaking with the correct person using two identifiers.  Location: Patient: patient home Provider: clinical home office   I discussed the limitations of evaluation and management by telemedicine and the availability of in person appointments. The patient expressed understanding and agreed to proceed.  I discussed the assessment and treatment plan with the patient. The patient was provided an opportunity to ask questions and all were answered. The patient agreed with the plan and demonstrated an understanding of the instructions.   The patient was advised to call back or seek an in-person evaluation if the symptoms worsen or if the condition fails to improve as anticipated.  Pt was provided 240 minutes of non-face-to-face time during this encounter.   [name], LCSW   [facility] PHP THERAPIST PROGRESS NOTE  Rylei Giasson [id number]  Session Time: 9:00 - 10:00  Participation Level: Active  Behavioral Response: CasualAlertDepressed  Type of Therapy: Group Therapy  Treatment Goals addressed: Coping  Progress Towards Goals: Progressing  Interventions: CBT, DBT, Supportive, and Reframing  Summary: Shelby Gallegos is a 42 y.o. female who presents with depression and mood symptoms.  Clinician led check-in regarding current stressors and situation, and review of patient completed daily inventory. Clinician utilized active listening and empathetic response and validated patient emotions. Clinician facilitated processing group on pertinent issues.?    Therapist Response: Patient arrived within time allowed. Patient rates her mood at a 7.5 on a scale of 1-10 with 10 being best. Pt states she feels "okay." Pt states she slept 6 hours and ate 2x yesterday. Pt reports she had a nightmare last night and experienced passive SI after waking up  from the nightmare. Pt reports being able to manage with coping skills. Pt states having an "intentional" morning and did worship, watched a TED talk, and ate breakfast. Pt reports she has goal for the day and feels optimistic. Patient able to process. Patient engaged in discussion.         Session Time: 10:00 am - 11:00 am   Participation Level: Active   Behavioral Response: CasualAlertDepressed   Type of Therapy: Group Therapy   Treatment Goals addressed: Coping   Progress Towards Goals: Progressing   Interventions: CBT, DBT, Solution Focused, Strength-based, Supportive, and Reframing   Therapist Response: Cln introduced CBT and the way in which it can provide context for addressing stumbling blocks. Group discussed "the problem is not the problem, the problem is how we're thinking about the problem" and tried to change perspective on current struggles.    Therapist Response:  Pt engaged in discussion and is able to attempt reframing using CBT.        Session Time: 11:00 -12:00   Participation Level: Active   Behavioral Response: CasualAlertDepressed   Type of Therapy: Group Therapy   Treatment Goals addressed: Coping   Progress Towards Goals: Progressing   Interventions: CBT, DBT, Solution Focused, Strength-based, Supportive, and Reframing   Summary: Cln continued topic of CBT cognitive distortions and utilized handout "Unhealthy Thought Patterns "to review common examples of distorted thought to increase awareness of the distorted thoughts.   Therapist Response:  Pt engaged in discussion and is able to make connections from her life to the distorted thoughts.          Session Time: 12:00 -1:00   Participation Level: Active   Behavioral Response:  CasualAlertDepressed   Type of Therapy: Group therapy, Occupational Therapy   Treatment Goals addressed: Coping   Progress Towards Goals: Progressing   Interventions: Supportive; Psychoeducation   Summary: 12:00  - 12:50: Occupational Therapy group led by cln [name]. 12:50 - 1:00 Clinician assessed for immediate needs, medication compliance and efficacy, and safety concerns.   Therapist Response: 12:00 - 12:50: See OT note 12:50 - 1:00 pm: At check-out, patient reports no immediate concerns. Patient demonstrates progress as evidenced by continued engagement and responsiveness to treatment. Patient denies SI/HI/self-harm thoughts at the end of group.     Suicidal/Homicidal: Nowithout intent/plan  Plan: Pt will continue in PHP while working to increase mood stability, decrease depression symptoms, and increase ability to manage symptoms in a healthy manner.   Collaboration of Care: Medication Management AEB [name]  Patient/Guardian was advised Release of Information must be obtained prior to any record release in order to collaborate their care with an outside provider. Patient/Guardian was advised if they have not already done so to contact the registration department to sign all necessary forms in order for [place] to release information regarding their care.   Consent: Patient/Guardian gives verbal consent for treatment and assignment of benefits for services provided during this visit. Patient/Guardian expressed understanding and agreed to proceed.   Diagnosis: Bipolar disorder, current episode depressed, severe, without psychotic features (HCC) [F31.4]    1. Bipolar disorder, current episode depressed, severe, without psychotic features (HCC)   2. Borderline personality disorder (HCC)   3. Difficulty coping   4. Panic attacks       [name], LCSW

## 2023-05-31 ENCOUNTER — Telehealth (HOSPITAL_COMMUNITY): Payer: Self-pay | Admitting: Psychiatry

## 2023-05-31 ENCOUNTER — Other Ambulatory Visit (HOSPITAL_COMMUNITY): Payer: BC Managed Care – PPO | Admitting: Psychiatry

## 2023-05-31 DIAGNOSIS — F4312 Post-traumatic stress disorder, chronic: Secondary | ICD-10-CM | POA: Diagnosis not present

## 2023-05-31 DIAGNOSIS — F314 Bipolar disorder, current episode depressed, severe, without psychotic features: Secondary | ICD-10-CM

## 2023-05-31 DIAGNOSIS — F603 Borderline personality disorder: Secondary | ICD-10-CM | POA: Diagnosis not present

## 2023-05-31 DIAGNOSIS — F411 Generalized anxiety disorder: Secondary | ICD-10-CM | POA: Diagnosis not present

## 2023-05-31 DIAGNOSIS — Z9151 Personal history of suicidal behavior: Secondary | ICD-10-CM | POA: Diagnosis not present

## 2023-05-31 NOTE — Psych (Signed)
Virtual Visit via Video Note  I connected with Shelby Gallegos on 05/10/23 at  9:00 AM EDT by a video enabled telemedicine application and verified that I am speaking with the correct person using two identifiers.  Location: Patient: patient home Provider: clinical home office   I discussed the limitations of evaluation and management by telemedicine and the availability of in person appointments. The patient expressed understanding and agreed to proceed.  I discussed the assessment and treatment plan with the patient. The patient was provided an opportunity to ask questions and all were answered. The patient agreed with the plan and demonstrated an understanding of the instructions.   The patient was advised to call back or seek an in-person evaluation if the symptoms worsen or if the condition fails to improve as anticipated.  Pt was provided 240 minutes of non-face-to-face time during this encounter.   [name], LCSW   [facility] PHP THERAPIST PROGRESS NOTE  Shelby Gallegos [id number]  Session Time: 9:00 - 10:00  Participation Level: Active  Behavioral Response: CasualAlertDepressed  Type of Therapy: Group Therapy  Treatment Goals addressed: Coping  Progress Towards Goals: Progressing  Interventions: CBT, DBT, Supportive, and Reframing  Summary: Shelby Gallegos is a 42 y.o. female who presents with depression and mood symptoms.  Clinician led check-in regarding current stressors and situation, and review of patient completed daily inventory. Clinician utilized active listening and empathetic response and validated patient emotions. Clinician facilitated processing group on pertinent issues.?    Therapist Response: Patient arrived within time allowed. Patient rates her mood at a 6.5 on a scale of 1-10 with 10 being best. Pt states she feels "anxious." Pt states she slept 4 hours and ate 1x yesterday. Pt reports she took a 3.5 hr nap after group, then washed her dog and took a  bath. Pt reports struggle with negative self talk and self judgment. Patient able to process. Patient engaged in discussion.         Session Time: 10:00 am - 11:00 am   Participation Level: Active   Behavioral Response: CasualAlertDepressed   Type of Therapy: Group Therapy   Treatment Goals addressed: Coping   Progress Towards Goals: Progressing   Interventions: CBT, DBT, Solution Focused, Strength-based, Supportive, and Reframing   Therapist Response: Cln introduced grounding techniques as a coping strategy. Cln utilized handout "Detaching from emotional pain" from [employer]. Group reviewed grounding strategies and how they can apply them to their every day life and in which situations.    Therapist Response:  Pt engaged in discussion and is able to identify ways to utilize the techniques.        Session Time: 11:00 -12:00   Participation Level: Active   Behavioral Response: CasualAlertDepressed   Type of Therapy: Group Therapy   Treatment Goals addressed: Coping   Progress Towards Goals: Progressing   Interventions: CBT, DBT, Solution Focused, Strength-based, Supportive, and Reframing   Summary: Cln led discussion on how to fill unplanned time. Group members shared ways in which downtime negatively impacts mental health and often causes them to dwell on negative thinking. Group brainstormed ways to manage down time and problem solved how to handle barriers.    Therapist Response: Pt engaged in discussion.        Session Time: 12:00 -1:00   Participation Level: Active   Behavioral Response: CasualAlertDepressed   Type of Therapy: Group therapy, Occupational Therapy   Treatment Goals addressed: Coping   Progress Towards Goals: Progressing   Interventions: Supportive;  Psychoeducation   Summary: 12:00 - 12:50: Occupational Therapy group led by cln [name]. 12:50 - 1:00 Clinician assessed for immediate needs, medication compliance and efficacy, and  safety concerns.   Therapist Response: 12:00 - 12:50: See OT note 12:50 - 1:00 pm: At check-out, patient reports no immediate concerns. Patient demonstrates progress as evidenced by continued engagement and responsiveness to treatment. Patient denies SI/HI/self-harm thoughts at the end of group.     Suicidal/Homicidal: Nowithout intent/plan  Plan: Pt will continue in PHP while working to increase mood stability, decrease depression symptoms, and increase ability to manage symptoms in a healthy manner.   Collaboration of Care: Medication Management AEB [name]  Patient/Guardian was advised Release of Information must be obtained prior to any record release in order to collaborate their care with an outside provider. Patient/Guardian was advised if they have not already done so to contact the registration department to sign all necessary forms in order for [place] to release information regarding their care.   Consent: Patient/Guardian gives verbal consent for treatment and assignment of benefits for services provided during this visit. Patient/Guardian expressed understanding and agreed to proceed.   Diagnosis: Bipolar disorder, current episode depressed, severe, without psychotic features (HCC) [F31.4]    1. Bipolar disorder, current episode depressed, severe, without psychotic features (HCC)   2. Nightmares associated with chronic post-traumatic stress disorder   3. Borderline personality disorder (HCC)   4. Panic attacks       [name], LCSW

## 2023-05-31 NOTE — Psych (Signed)
Virtual Visit via Video Note  I connected with Shelby Gallegos on 05/09/23 at  9:00 AM EDT by a video enabled telemedicine application and verified that I am speaking with the correct person using two identifiers.  Location: Patient: patient home Provider: clinical home office   I discussed the limitations of evaluation and management by telemedicine and the availability of in person appointments. The patient expressed understanding and agreed to proceed.  I discussed the assessment and treatment plan with the patient. The patient was provided an opportunity to ask questions and all were answered. The patient agreed with the plan and demonstrated an understanding of the instructions.   The patient was advised to call back or seek an in-person evaluation if the symptoms worsen or if the condition fails to improve as anticipated.  Pt was provided 240 minutes of non-face-to-face time during this encounter.   [name], LCSW   [facility] PHP THERAPIST PROGRESS NOTE  Shelby Gallegos [id number]  Session Time: 9:00 - 10:00  Participation Level: Active  Behavioral Response: CasualAlertDepressed  Type of Therapy: Group Therapy  Treatment Goals addressed: Coping  Progress Towards Goals: Progressing  Interventions: CBT, DBT, Supportive, and Reframing  Summary: Shelby Gallegos is a 42 y.o. female who presents with depression and mood symptoms.  Clinician led check-in regarding current stressors and situation, and review of patient completed daily inventory. Clinician utilized active listening and empathetic response and validated patient emotions. Clinician facilitated processing group on pertinent issues.?    Therapist Response: Patient arrived within time allowed. Patient rates her mood at a 8 on a scale of 1-10 with 10 being best. Pt states she feels "not bad." Pt states she slept 7 hours and ate 2x yesterday. Pt reports her weekend was "good" and she did a Futures trader, a Systems developer,  and [employer], and took naps. Pt shares Saturday night was "rough" due to increased thinking about upcoming court date. Pt reports poor sleep, racing thoughts, and passive SI (which she was able to manage with coping skills). Patient able to process. Patient engaged in discussion.         Session Time: 10:00 am - 11:00 am   Participation Level: Active   Behavioral Response: CasualAlertDepressed   Type of Therapy: Group Therapy   Treatment Goals addressed: Coping   Progress Towards Goals: Progressing   Interventions: CBT, DBT, Solution Focused, Strength-based, Supportive, and Reframing   Therapist Response: Cln led discussion on deep breathing and its therapeutic benefits, using DBT TIPP skills to inform discussion. Group practiced how to breathe from their diaphragms to ensure therapeutic quality and different ways to keep track of regulating breaths.       Therapist Response: Pt engaged in discussion and practice.          Session Time: 11:00 -12:00   Participation Level: Active   Behavioral Response: CasualAlertDepressed   Type of Therapy: Group Therapy   Treatment Goals addressed: Coping   Progress Towards Goals: Progressing   Interventions: CBT, DBT, Solution Focused, Strength-based, Supportive, and Reframing   Summary: Cln led discussion on saying "no." Group members shared struggles they have with saying no and how it affects them. Cln utilized boundaries, communication, and self-esteem tenets to inform discussion.   Therapist Response:  Pt engaged in discussion and reports increased understanding of how to say "no."         Session Time: 12:00 -1:00   Participation Level: Active   Behavioral Response: CasualAlertDepressed   Type of Therapy: Group  therapy, Occupational Therapy   Treatment Goals addressed: Coping   Progress Towards Goals: Progressing   Interventions: Supportive; Psychoeducation   Summary: 12:00 - 12:50: Occupational Therapy group  led by cln [name]. 12:50 - 1:00 Clinician assessed for immediate needs, medication compliance and efficacy, and safety concerns.   Therapist Response: 12:00 - 12:50: See OT note 12:50 - 1:00 pm: At check-out, patient reports no immediate concerns. Patient demonstrates progress as evidenced by continued engagement and responsiveness to treatment. Patient denies SI/HI/self-harm thoughts at the end of group.     Suicidal/Homicidal: Nowithout intent/plan  Plan: Pt will continue in PHP while working to increase mood stability, decrease depression symptoms, and increase ability to manage symptoms in a healthy manner.   Collaboration of Care: Medication Management AEB [name]  Patient/Guardian was advised Release of Information must be obtained prior to any record release in order to collaborate their care with an outside provider. Patient/Guardian was advised if they have not already done so to contact the registration department to sign all necessary forms in order for [place] to release information regarding their care.   Consent: Patient/Guardian gives verbal consent for treatment and assignment of benefits for services provided during this visit. Patient/Guardian expressed understanding and agreed to proceed.   Diagnosis: Bipolar disorder, current episode depressed, severe, without psychotic features (HCC) [F31.4]    1. Bipolar disorder, current episode depressed, severe, without psychotic features (HCC)   2. Borderline personality disorder (HCC)       [name], LCSW

## 2023-05-31 NOTE — Psych (Signed)
Virtual Visit via Video Note  I connected with Shelby Gallegos on 05/12/23 at  9:00 AM EDT by a video enabled telemedicine application and verified that I am speaking with the correct person using two identifiers.  Location: Patient: patient home Provider: clinical home office   I discussed the limitations of evaluation and management by telemedicine and the availability of in person appointments. The patient expressed understanding and agreed to proceed.  I discussed the assessment and treatment plan with the patient. The patient was provided an opportunity to ask questions and all were answered. The patient agreed with the plan and demonstrated an understanding of the instructions.   The patient was advised to call back or seek an in-person evaluation if the symptoms worsen or if the condition fails to improve as anticipated.  Pt was provided 240 minutes of non-face-to-face time during this encounter.   [name], LCSW   [facility] PHP THERAPIST PROGRESS NOTE  Shelby Gallegos [id number]  Session Time: 9:00 - 10:00  Participation Level: Active  Behavioral Response: CasualAlertDepressed  Type of Therapy: Group Therapy  Treatment Goals addressed: Coping  Progress Towards Goals: Progressing  Interventions: CBT, DBT, Supportive, and Reframing  Summary: Shelby Gallegos is a 42 y.o. female who presents with depression and mood symptoms.  Clinician led check-in regarding current stressors and situation, and review of patient completed daily inventory. Clinician utilized active listening and empathetic response and validated patient emotions. Clinician facilitated processing group on pertinent issues.?    Therapist Response: Patient arrived within time allowed. Patient rates her mood at a 8 on a scale of 1-10 with 10 being best. Pt states she feels "good." Pt states she slept 8 hours and ate 2x yesterday. Pt reports she forgot to take her anxiety medication until the evening yesterday and  felt the change. Pt states she went to a new bible study yesterday and was worried due to the new people however followed her plan to minimize anxiety and she was able to stay the whole time. Pt reports feeling proud of herself. Patient able to process. Patient engaged in discussion.         Session Time: 10:00 am - 11:00 am   Participation Level: Active   Behavioral Response: CasualAlertDepressed   Type of Therapy: Group Therapy   Treatment Goals addressed: Coping   Progress Towards Goals: Progressing   Interventions: CBT, DBT, Solution Focused, Strength-based, Supportive, and Reframing   Therapist Response:  Cln continued topic of CBT cognitive distortions. Cln utilized handout "cognitive distortions" to discuss common examples of distorted thoughts and group members worked to identify examples in their own life.    Therapist Response: Pt engaged in discussion and identifies which are most problematic.          Session Time: 11:00 -12:00   Participation Level: Active   Behavioral Response: CasualAlertDepressed   Type of Therapy: Group Therapy   Treatment Goals addressed: Coping   Progress Towards Goals: Progressing   Interventions: CBT, DBT, Solution Focused, Strength-based, Supportive, and Reframing   Summary: Cln introduced DBT distress tolerance skill IMPROVE.  Cln discussed how this set of skills are for when you have to sit through an undesirable feeling and wait for it to pass. Group discussed how to apply the IMPROVE skills to decrease distress at the undesired feeling.   Therapist Response: Pt engaged in discussion and is able to identify ways to apply the skill.       Session Time: 12:00 -1:00   Participation Level:  Active   Behavioral Response: CasualAlertDepressed   Type of Therapy: Group therapy, Occupational Therapy   Treatment Goals addressed: Coping   Progress Towards Goals: Progressing   Interventions: Supportive; Psychoeducation   Summary:  12:00 - 12:50: Occupational Therapy group led by cln [name]. 12:50 - 1:00 Clinician assessed for immediate needs, medication compliance and efficacy, and safety concerns.   Therapist Response: 12:00 - 12:50: See OT note 12:50 - 1:00 pm: At check-out, patient reports no immediate concerns. Patient demonstrates progress as evidenced by continued engagement and responsiveness to treatment. Patient denies SI/HI/self-harm thoughts at the end of group.     Suicidal/Homicidal: Nowithout intent/plan  Plan: Pt will continue in PHP while working to increase mood stability, decrease depression symptoms, and increase ability to manage symptoms in a healthy manner.   Collaboration of Care: Medication Management AEB [name]  Patient/Guardian was advised Release of Information must be obtained prior to any record release in order to collaborate their care with an outside provider. Patient/Guardian was advised if they have not already done so to contact the registration department to sign all necessary forms in order for [place] to release information regarding their care.   Consent: Patient/Guardian gives verbal consent for treatment and assignment of benefits for services provided during this visit. Patient/Guardian expressed understanding and agreed to proceed.   Diagnosis: Bipolar disorder, current episode depressed, severe, without psychotic features (HCC) [F31.4]    1. Bipolar disorder, current episode depressed, severe, without psychotic features (HCC)   2. Borderline personality disorder (HCC)       [name], LCSW

## 2023-05-31 NOTE — Progress Notes (Signed)
Virtual Visit via Video Note   I connected with Shelby Gallegos on 05/31/23 at  9:00 AM EDT by a video enabled telemedicine application and verified that I am speaking with the correct person using two identifiers.   At orientation to the IOP program, Case Manager discussed the limitations of evaluation and management by telemedicine and the availability of in person appointments. The patient expressed understanding and agreed to proceed with virtual visits throughout the duration of the program.   Location:  Patient: Patient Home Provider: OPT BH Office   History of Present Illness: Bipolar Disorder and Borderline Personality Disorder  Observations/Objective: Check In: Case Manager checked in with all participants to review discharge dates, insurance authorizations, work-related documents and needs from the treatment team regarding medications. Shelby Gallegos stated needs and engaged in discussion.    Initial Therapeutic Activity: Counselor facilitated a check-in with Shelby Gallegos to assess for safety, sobriety and medication compliance.  Counselor also inquired about Shelby Gallegos's current emotional ratings, as well as any significant changes in thoughts, feelings or behavior since previous check in.  Shelby Gallegos presented for session on time and was alert, oriented x5, with no evidence or self-report of active SI/HI or A/V H.  Shelby Gallegos reported compliance with medication and denied use of alcohol or illicit substances.  Shelby Gallegos reported scores of 6/10 for depression, 6/10 for anxiety, and 0/10 for anger/irritability.  Shelby Gallegos denied any recent outbursts.  Shelby Gallegos reported that a recent success was completing her Lego kit, and doing some household chores yesterday.  Shelby Gallegos reported that a recent struggle was having a difficult night, which led her to have a panic attack, and experience passive SI without intent or plan.  She reported that she called 73 and her aunt, which helped her call down.  Shelby Gallegos reported that her goal today is to take  her dog to the groomer and attend an AA meeting since she had thought about drinking last night while in crisis.       Second Therapeutic Activity: Counselor introduced [employer], [place] Chaplain to provide psychoeducation on topic of Grief and Loss with members today.  Shelby Gallegos began discussion by checking in with the group about their baseline mood today, general thoughts on what grief means to them and how it has affected them personally in the past.  Shelby Gallegos provided information on how the process of grief/loss can differ depending upon one's unique culture, and categories of loss one could experience (i.e. loss of a person, animal, relationship, job, identity, etc).  Shelby Gallegos encouraged members to be mindful of how pervasive loss can be, and how to recognize signs which could indicate that this is having an impact on one's overall mental health and wellbeing.  Intervention was effective, as evidenced by Shelby Gallegos participating in discussion with speaker on the subject, reporting that this made her reflect upon the transition from her previous career due to trauma, stating "When I changed jobs it was like a loss of identity.  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  Its hard to identify yourself to other people after that".  She was receptive to supportive feedback from chaplain on how to cope with role losses like this.      Third Therapeutic Activity: Counselor introduced topic of grounding skills today.  Counselor defined these as simple strategies one can use to help detach from difficult thoughts or feelings temporarily by focusing on something else.  Counselor noted that grounding will not solve the problem at hand, but can provide  the practitioner with time to regain control over their thoughts and/or feelings and prevent the situation from getting worse (i.e. interrupting a panic attack).  Counselor divided these into three categories (mental, physical, and soothing) and then provided examples of  each which group members could practice during session.  Some of these included describing one's environment in detail or playing a categories game with oneself for mental category, taking a hot bath/shower, stretching, or carrying a grounding object for physical category, and saying kind statements, or visualizing people one cares about for soothing category.  Counselor inquired about which techniques members have used with success in the past, or will commit to learning, practicing, and applying now to improve coping abilities.  Intervention was effective, as evidenced by [facility] Gallegos participating in discussion on the subject, trying out several of the techniques during session, and expressing interest in adding several to her available coping skills, such as describing her environment in great detail, playing a categories game involving naming Disney characters, visualizing herself on a flight in a plane through white clouds, listening to an engrossing audio book, watching funny Youtube videos to make her laugh, counting to 10, holding a piece of ice in her hand, squeezing a stress ball, stretching, practicing deep breathing, telling herself "The world needs you", looking back at meaningful [employer] cards people have given her in the past, and reading inspiring biblical scripture.   Assessment and Plan: Counselor recommends that Shelby Gallegos remain in IOP treatment to better manage mental health symptoms, ensure stability and pursue completion of treatment plan goals. Counselor recommends adherence to crisis/safety plan, taking medications as prescribed, and following up with medical professionals if any issues arise.    Follow Up Instructions: Counselor will send Webex link for session tomorrow.  Chua was advised to call back or seek an in-person evaluation if the symptoms worsen or if the condition fails to improve as anticipated.   Collaboration of Care:   Medication Management AEB Dr. Eliseo Gum or Shelby Jacks, NP                                          Case Manager AEB Shelby [place], CNA   Patient/Guardian was advised Release of Information must be obtained prior to any record release in order to collaborate their care with an outside provider. Patient/Guardian was advised if they have not already done so to contact the registration department to sign all necessary forms in order for [place] to release information regarding their care.   Consent: Patient/Guardian gives verbal consent for treatment and assignment of benefits for services provided during this visit. Patient/Guardian expressed understanding and agreed to proceed.  I provided 180 minutes of non-face-to-face time during this encounter.   [name], LCSW, LCAS 05/31/23

## 2023-05-31 NOTE — Psych (Signed)
Virtual Visit via Video Note  I connected with Shelby Gallegos on 05/06/23 at  9:00 AM EDT by a video enabled telemedicine application and verified that I am speaking with the correct person using two identifiers.  Location: Patient: patient home Provider: clinical home office   I discussed the limitations of evaluation and management by telemedicine and the availability of in person appointments. The patient expressed understanding and agreed to proceed.  I discussed the assessment and treatment plan with the patient. The patient was provided an opportunity to ask questions and all were answered. The patient agreed with the plan and demonstrated an understanding of the instructions.   The patient was advised to call back or seek an in-person evaluation if the symptoms worsen or if the condition fails to improve as anticipated.  Pt was provided 240 minutes of non-face-to-face time during this encounter.   [name], LCSW   [facility] PHP THERAPIST PROGRESS NOTE  Shelby Gallegos [id number]  Session Time: 9:00 - 10:00  Participation Level: Active  Behavioral Response: CasualAlertDepressed  Type of Therapy: Group Therapy  Treatment Goals addressed: Coping  Progress Towards Goals: Progressing  Interventions: CBT, DBT, Supportive, and Reframing  Summary: Shelby Gallegos is a 42 y.o. female who presents with depression and mood symptoms.  Clinician led check-in regarding current stressors and situation, and review of patient completed daily inventory. Clinician utilized active listening and empathetic response and validated patient emotions. Clinician facilitated processing group on pertinent issues.?    Therapist Response: Patient arrived within time allowed. Patient rates her mood at a 6 on a scale of 1-10 with 10 being best. Pt states she feels "okay." Pt states she slept 3-4 hours and ate 2x yesterday. Pt reports she had a nightmare/flashback last night and experienced passive SI after  waking up from the nightmare and difficulty managing on her own so she called [phone]. Pt reports being able to calm down and eliminate SI during the call. Pt reports the second day of working on her morning routine. Patient able to process. Patient engaged in discussion.         Session Time: 10:00 am - 11:00 am   Participation Level: Active   Behavioral Response: CasualAlertDepressed   Type of Therapy: Group Therapy   Treatment Goals addressed: Coping   Progress Towards Goals: Progressing   Interventions: CBT, DBT, Solution Focused, Strength-based, Supportive, and Reframing   Therapist Response: Cln led discussion on ways to manage stressors and feelings over the weekend. Group members  brainstormed things to do over the weekend for multiple levels of energy, access, and moods. Cln reviewed crisis services should they be needed and provided pt's with the text crisis line, mobile crisis, national suicide hotline, [facility] 24/7 line, and information on [facility].      Therapist Response: Pt engaged in discussion and is able to identify 3 ideas of what to do over the weekend to keep their mind engaged. Pt reports concern due to her three main supports all being out of town this weekend. Pt is able to plan how to handle stressors.        Session Time: 11:00 -12:00   Participation Level: Active   Behavioral Response: CasualAlertDepressed   Type of Therapy: Group Therapy   Treatment Goals addressed: Coping   Progress Towards Goals: Progressing   Interventions: CBT, DBT, Solution Focused, Strength-based, Supportive, and Reframing   Summary: Cln led discussion on CBT thinking error: mind reading. Cln worked with group members to identify  examples of mind reading and the consequences that can come. Group shared ways in which mind reading has been an issue for them and barriers to working on it.    Therapist Response:   Pt engaged in discussion and reports understanding of mind reading.         Session Time: 12:00 -1:00   Participation Level: Active   Behavioral Response: CasualAlertDepressed   Type of Therapy: Group therapy, Occupational Therapy   Treatment Goals addressed: Coping   Progress Towards Goals: Progressing   Interventions: Supportive; Psychoeducation   Summary: 12:00 - 12:50: Occupational Therapy group led by cln [name]. 12:50 - 1:00 Clinician assessed for immediate needs, medication compliance and efficacy, and safety concerns.   Therapist Response: 12:00 - 12:50: See OT note 12:50 - 1:00 pm: At check-out, patient reports no immediate concerns. Patient demonstrates progress as evidenced by continued engagement and responsiveness to treatment. Patient denies SI/HI/self-harm thoughts at the end of group.     Suicidal/Homicidal: Nowithout intent/plan  Plan: Pt will continue in PHP while working to increase mood stability, decrease depression symptoms, and increase ability to manage symptoms in a healthy manner.   Collaboration of Care: Medication Management AEB [name]  Patient/Guardian was advised Release of Information must be obtained prior to any record release in order to collaborate their care with an outside provider. Patient/Guardian was advised if they have not already done so to contact the registration department to sign all necessary forms in order for [place] to release information regarding their care.   Consent: Patient/Guardian gives verbal consent for treatment and assignment of benefits for services provided during this visit. Patient/Guardian expressed understanding and agreed to proceed.   Diagnosis: Bipolar disorder, current episode depressed, severe, without psychotic features (HCC) [F31.4]    1. Bipolar disorder, current episode depressed, severe, without psychotic features (HCC)   2. Borderline personality disorder (HCC)       [name], LCSW

## 2023-05-31 NOTE — Telephone Encounter (Signed)
D:  At 10:12 pm lastnight pt had sent the MH-IOP case manager an email stating she was having a difficult night.  "I had a panic attack and trying to use my tools to get grounded.  I am trying to keep it together."  According to pt, she was cleaning the house and came across the ex-roommate's items.  "I started panicking and almost dranked, but I called [phone]."  States she was having passive SI (no plan or intent).  Reports she had called her aunt first.  "I didn't sleep at all, but I feel better today.  I didn't have to go to [facility]."  Pt states her therapist (who specializes in EMDR and DBT) is out of town this week and the therapist usually allows pt to text her whenever she's in distress.  Pt denies SI this morning.  A:  Provided pt with support.  Praised pt for using her coping skills.  Encouraged pt to structure her day and keep busy this afternoon and tonight.  Pt states she plans to go to an AA mtg with a friend today and take her dog to the groomer.  Discussed safety options at length with pt.  Strongly encouraged pt to call [phone] or go to [facility] for an assessment if needed.  Inform Hillery Jacks, NP and [name], LCSW.  R:  Pt receptive.

## 2023-05-31 NOTE — Psych (Signed)
Virtual Visit via Video Note  I connected with Shelby Gallegos on 05/11/23 at  9:00 AM EDT by a video enabled telemedicine application and verified that I am speaking with the correct person using two identifiers.  Location: Patient: patient home Provider: clinical home office   I discussed the limitations of evaluation and management by telemedicine and the availability of in person appointments. The patient expressed understanding and agreed to proceed.  I discussed the assessment and treatment plan with the patient. The patient was provided an opportunity to ask questions and all were answered. The patient agreed with the plan and demonstrated an understanding of the instructions.   The patient was advised to call back or seek an in-person evaluation if the symptoms worsen or if the condition fails to improve as anticipated.  Pt was provided 240 minutes of non-face-to-face time during this encounter.   [name], LCSW   [facility] PHP THERAPIST PROGRESS NOTE  Shelby Gallegos [id number]  Session Time: 9:00 - 10:00  Participation Level: Active  Behavioral Response: CasualAlertDepressed  Type of Therapy: Group Therapy  Treatment Goals addressed: Coping  Progress Towards Goals: Progressing  Interventions: CBT, DBT, Supportive, and Reframing  Summary: Shelby Gallegos is a 42 y.o. female who presents with depression and mood symptoms.  Clinician led check-in regarding current stressors and situation, and review of patient completed daily inventory. Clinician utilized active listening and empathetic response and validated patient emotions. Clinician facilitated processing group on pertinent issues.?    Therapist Response: Patient arrived within time allowed. Patient rates her mood at a 7.5 on a scale of 1-10 with 10 being best. Pt states she feels "okay." Pt states she slept 5 hours and ate 1x yesterday. Pt reports she ran an errand and treated herself to lunch yesterday. Pt reports  making more efforts to do acts of self-care. Pt states she talked to her aunt and did an Museum/gallery exhibitions officer. Shares she is starting a new bible study tonight and is "really" anxious about it. Patient able to process. Patient engaged in discussion.         Session Time: 10:00 am - 11:00 am   Participation Level: Active   Behavioral Response: CasualAlertDepressed   Type of Therapy: Group Therapy   Treatment Goals addressed: Coping   Progress Towards Goals: Progressing   Interventions: CBT, DBT, Solution Focused, Strength-based, Supportive, and Reframing   Therapist Response: Cln led processing group for pt's current struggles. Group members shared stressors and provided support and feedback. Cln brought in topics of boundaries, healthy relationships, and unhealthy thought processes to inform discussion.    Therapist Response: Pt able to process and provide support to group.          Session Time: 11:00 -12:00   Participation Level: Active   Behavioral Response: CasualAlertDepressed   Type of Therapy: Group Therapy, Spiritual Care   Treatment Goals addressed: Coping   Progress Towards Goals: Progressing   Interventions: Supportive, Education   Summary:  [name], Chaplain, led group.   Therapist Response: Pt participated        Session Time: 12:00 -1:00   Participation Level: Active   Behavioral Response: CasualAlertDepressed   Type of Therapy: Group therapy, Occupational Therapy   Treatment Goals addressed: Coping   Progress Towards Goals: Progressing   Interventions: Supportive; Psychoeducation   Summary: 12:00 - 12:50: Occupational Therapy group led by cln [name]. 12:50 - 1:00 Clinician assessed for immediate needs, medication compliance and efficacy, and safety concerns.   Therapist  Response: 12:00 - 12:50: See OT note 12:50 - 1:00 pm: At check-out, patient reports no immediate concerns. Patient demonstrates progress as evidenced by continued  engagement and responsiveness to treatment. Patient denies SI/HI/self-harm thoughts at the end of group.     Suicidal/Homicidal: Nowithout intent/plan  Plan: Pt will continue in PHP while working to increase mood stability, decrease depression symptoms, and increase ability to manage symptoms in a healthy manner.   Collaboration of Care: Medication Management AEB [name]  Patient/Guardian was advised Release of Information must be obtained prior to any record release in order to collaborate their care with an outside provider. Patient/Guardian was advised if they have not already done so to contact the registration department to sign all necessary forms in order for [place] to release information regarding their care.   Consent: Patient/Guardian gives verbal consent for treatment and assignment of benefits for services provided during this visit. Patient/Guardian expressed understanding and agreed to proceed.   Diagnosis: Bipolar disorder, current episode depressed, severe, without psychotic features (HCC) [F31.4]    1. Bipolar disorder, current episode depressed, severe, without psychotic features (HCC)   2. Borderline personality disorder (HCC)       [name], LCSW

## 2023-06-01 ENCOUNTER — Other Ambulatory Visit (HOSPITAL_COMMUNITY): Payer: BC Managed Care – PPO | Admitting: Licensed Clinical Social Worker

## 2023-06-01 DIAGNOSIS — F603 Borderline personality disorder: Secondary | ICD-10-CM

## 2023-06-01 DIAGNOSIS — F411 Generalized anxiety disorder: Secondary | ICD-10-CM | POA: Diagnosis not present

## 2023-06-01 DIAGNOSIS — F4312 Post-traumatic stress disorder, chronic: Secondary | ICD-10-CM | POA: Diagnosis not present

## 2023-06-01 DIAGNOSIS — Z9151 Personal history of suicidal behavior: Secondary | ICD-10-CM | POA: Diagnosis not present

## 2023-06-01 DIAGNOSIS — F314 Bipolar disorder, current episode depressed, severe, without psychotic features: Secondary | ICD-10-CM

## 2023-06-01 NOTE — Progress Notes (Signed)
Virtual Visit via Video Note   I connected with [name] on 06/01/23 at  9:00 AM EDT by a video enabled telemedicine application and verified that I am speaking with the correct person using two identifiers.   At orientation to the IOP program, Case Manager discussed the limitations of evaluation and management by telemedicine and the availability of in person appointments. The patient expressed understanding and agreed to proceed with virtual visits throughout the duration of the program.   Location:  Patient: Patient Home Provider: OPT BH Office   History of Present Illness: Bipolar Disorder and Borderline Personality Disorder  Observations/Objective: Check In: Case Manager checked in with all participants to review discharge dates, insurance authorizations, work-related documents and needs from the treatment team regarding medications. Anicia stated needs and engaged in discussion.    Initial Therapeutic Activity: Counselor facilitated a check-in with Ariyel to assess for safety, sobriety and medication compliance.  Counselor also inquired about Alys's current emotional ratings, as well as any significant changes in thoughts, feelings or behavior since previous check in.  [place] presented for session on time and was alert, oriented x5, with no evidence or self-report of active SI/HI or A/V H.  Delainey reported compliance with medication and denied use of alcohol or illicit substances.  Shantrell reported scores of 4/10 for depression, 3/10 for anxiety, and 0/10 for anger/irritability.  Jheri denied any recent outbursts or panic attacks.  Thandiwe reported that a recent success was taking her dog to the groomer and getting a good night's sleep yesterday.  Lakyla reported that a struggle was missing an AA meeting she had planned to attend due to anxiety.  Seda reported that her goal today is to get out of the house and run a few errands to be productive, and try to attend AA again.       Second Therapeutic  Activity: Counselor introduced Peggye Fothergill, [employer] Pharmacist, to provide psychoeducation on topic of medication compliance with members today.  Michelle Nasuti provided psychoeducation on classes of medications such as antidepressants, antipsychotics, what symptoms they are intended to treat, and any side effects one might encounter while on a particular prescription.  Time was allowed for clients to ask any questions they might have of [facility] regarding this specialty.  Intervention effectiveness could not be measured, as client did not participate.    Third Therapeutic Activity: Counselor provided psychoeducation on subject of boundaries with group members today using a virtual handout.  This handout defined boundaries as the limits and rules that we set for ourselves within relationships, and featured a breakdown of the 3 common categories of boundaries (i.e. porous, rigid, and healthy), along with typical traits specific to each one for easy identification.  It was noted that most people have a mixture of different boundary types depending on setting, person, and culture.  Additional information was provided on the types of boundaries (i.e. physical, intellectual, emotional, sexual, material, and time) within relationships, and what could be considered healthy versus unhealthy. Counselor tasked members with identifying what types of boundaries they presently hold within her own support systems, the collective impact these boundaries have upon their mental health, and changes that could be made in order to more effectively communicate individual mental health needs.  Intervention was effective, as evidenced by Harvin Hazel actively engaging in discussion on topic, reporting that she has porous boundaries due to traits such as having trouble saying "No" to the requests of others, being accepting of abuse or disrespect, and fearing rejection from others  if she doesn't comply with requests.  Arbutus reported that she found this subject  to be stressful, as she has historically struggled with managing boundaries appropriately, and had to take a break during the subject to calm down with deep breathing.    Assessment and Plan: Counselor recommends that [facility] remain in IOP treatment to better manage mental health symptoms, ensure stability and pursue completion of treatment plan goals. Counselor recommends adherence to crisis/safety plan, taking medications as prescribed, and following up with medical professionals if any issues arise.    Follow Up Instructions: Counselor will send Webex link for session tomorrow.  Otavia was advised to call back or seek an in-person evaluation if the symptoms worsen or if the condition fails to improve as anticipated.   Collaboration of Care:   Medication Management AEB Dr. Eliseo Gum or Hillery Jacks, NP                                          Case Manager AEB [name], CNA   Patient/Guardian was advised Release of Information must be obtained prior to any record release in order to collaborate their care with an outside provider. Patient/Guardian was advised if they have not already done so to contact the registration department to sign all necessary forms in order for [place] to release information regarding their care.   Consent: Patient/Guardian gives verbal consent for treatment and assignment of benefits for services provided during this visit. Patient/Guardian expressed understanding and agreed to proceed.  I provided 180 minutes of non-face-to-face time during this encounter.   [name], LCSW, LCAS 06/01/23

## 2023-06-03 ENCOUNTER — Telehealth (HOSPITAL_COMMUNITY): Payer: Self-pay | Admitting: Licensed Clinical Social Worker

## 2023-06-03 ENCOUNTER — Other Ambulatory Visit (HOSPITAL_COMMUNITY): Payer: BC Managed Care – PPO

## 2023-06-03 NOTE — Telephone Encounter (Signed)
[place] was scheduled to attend virtual MHIOP group today. Harvin Hazel outreached case manager on July 4th at 9:45pm via email reporting that she was experiencing a horrible migraine, which might affect attendance to group this morning.  Clinician Julio Sicks by phone at 10:43am today to inquire about her absence from group, and if she was feeling better.  Caterine did not answer phone call, and a voicemail could not be left.  Clinician informed care team of her absence from group and will attempt outreach again if she doesn't present on Monday.    [name], LCSW, LCAS 06/03/23

## 2023-06-06 ENCOUNTER — Other Ambulatory Visit (HOSPITAL_COMMUNITY): Payer: BC Managed Care – PPO | Admitting: Licensed Clinical Social Worker

## 2023-06-06 DIAGNOSIS — F4312 Post-traumatic stress disorder, chronic: Secondary | ICD-10-CM | POA: Diagnosis not present

## 2023-06-06 DIAGNOSIS — F314 Bipolar disorder, current episode depressed, severe, without psychotic features: Secondary | ICD-10-CM

## 2023-06-06 DIAGNOSIS — F603 Borderline personality disorder: Secondary | ICD-10-CM | POA: Diagnosis not present

## 2023-06-06 DIAGNOSIS — F411 Generalized anxiety disorder: Secondary | ICD-10-CM | POA: Diagnosis not present

## 2023-06-06 DIAGNOSIS — Z9151 Personal history of suicidal behavior: Secondary | ICD-10-CM | POA: Diagnosis not present

## 2023-06-06 NOTE — Progress Notes (Signed)
Virtual Visit via Video Note   I connected with Shelby Gallegos on 06/06/23 at  9:00 AM EDT by a video enabled telemedicine application and verified that I am speaking with the correct person using two identifiers.   At orientation to the IOP program, Case Manager discussed the limitations of evaluation and management by telemedicine and the availability of in person appointments. The patient expressed understanding and agreed to proceed with virtual visits throughout the duration of the program.   Location:  Patient: Patient Home Provider: Clinical Home Office   History of Present Illness: Bipolar Disorder and Borderline Personality Disorder  Observations/Objective: Check In: Case Manager checked in with all participants to review discharge dates, insurance authorizations, work-related documents and needs from the treatment team regarding medications. Shelby Gallegos stated needs and engaged in discussion.    Initial Therapeutic Activity: Counselor facilitated a check-in with Shelby Gallegos to assess for safety, sobriety and medication compliance.  Counselor also inquired about Shelby Gallegos's current emotional ratings, as well as any significant changes in thoughts, feelings or behavior since previous check in.  Shelby Gallegos presented for session on time and was alert, oriented x5, with no evidence or self-report of active SI/HI or A/V H.  Shelby Gallegos reported compliance with medication and denied use of alcohol or illicit substances.  Shelby Gallegos reported scores of 4/10 for depression, 3/10 for anxiety, and 0/10 for anger/irritability.  Shelby Gallegos denied any recent outbursts or panic attacks.  Shelby Gallegos reported that a [place].  Shelby Gallegos reported that a recent struggle was missing group the other day due to a migraine.  Shelby Gallegos reported that her goal today is to take things easy in her schedule, since she is recovering from a sunburn.       Second Therapeutic Activity: Counselor utilized a  Cabin crew with group members today to guide discussion on topic of codependency.  This handout defined codependency as excessive emotional or psychological reliance upon someone who requires support on account of an illness or addiction.  It also explained how this issue presents in dysfunctional family systems, including behavior such as denying existence of problems, rigid boundaries on communication, strained trust, lack of individuality, and reinforcement of unhealthy coping mechanisms such as substance use.  Characteristics of co-dependent people were listed for assistance with identification, such as extreme need for approval/recognition, difficulty identifying feelings, poor communication, and more.  Members were also tasked with completing a questionnaire in order to identify signs of codependency and results were discussed afterward.  This handout also offered strategies for resolving co-dependency within one's network, including increased use of assertive communication skills in order to set appropriate boundaries.  Intervention effectiveness was mixed, as evidenced by Shelby Gallegos completing codependency questionnaire, with 14 out of 20 positive responses, but not engaging in discussion on subject or how codependency has impacted her mental health.    Assessment and Plan: Counselor recommends that Shelby Gallegos remain in IOP treatment to better manage mental health symptoms, ensure stability and pursue completion of treatment plan goals. Counselor recommends adherence to crisis/safety plan, taking medications as prescribed, and following up with medical professionals if any issues arise.    Follow Up Instructions: Counselor will send Webex link for session tomorrow.  Shelby Gallegos was advised to call back or seek an in-person evaluation if the symptoms worsen or if the condition fails to improve as anticipated.   Collaboration of Care:   Medication Management AEB Dr. Eliseo Gum or Hillery Jacks, NP  Case Manager AEB [name], CNA   Patient/Guardian was advised Release of Information must be obtained prior to any record release in order to collaborate their care with an outside provider. Patient/Guardian was advised if they have not already done so to contact the registration department to sign all necessary forms in order for [place] to release information regarding their care.   Consent: Patient/Guardian gives verbal consent for treatment and assignment of benefits for services provided during this visit. Patient/Guardian expressed understanding and agreed to proceed.  I provided 160 minutes of non-face-to-face time during this encounter.   Shelby Stain, LCSW, LCAS 06/06/23

## 2023-06-07 ENCOUNTER — Telehealth (HOSPITAL_COMMUNITY): Payer: Self-pay | Admitting: Psychiatry

## 2023-06-07 ENCOUNTER — Other Ambulatory Visit (HOSPITAL_COMMUNITY): Payer: BC Managed Care – PPO | Admitting: Psychiatry

## 2023-06-07 DIAGNOSIS — Z9151 Personal history of suicidal behavior: Secondary | ICD-10-CM | POA: Diagnosis not present

## 2023-06-07 DIAGNOSIS — F314 Bipolar disorder, current episode depressed, severe, without psychotic features: Secondary | ICD-10-CM

## 2023-06-07 DIAGNOSIS — F603 Borderline personality disorder: Secondary | ICD-10-CM

## 2023-06-07 DIAGNOSIS — F411 Generalized anxiety disorder: Secondary | ICD-10-CM | POA: Diagnosis not present

## 2023-06-07 DIAGNOSIS — F4312 Post-traumatic stress disorder, chronic: Secondary | ICD-10-CM | POA: Diagnosis not present

## 2023-06-07 NOTE — Telephone Encounter (Signed)
D:  Patient sent an email stating she had a rough night lastnight.  Case manager placed call to check on patient.  According to pt, she called [phone] lastnight.  "I was having a dark night with SI."  States it's been a rough day.  Reports just wanting to isolate and go to bed.  Denies SI currently.  A:  Provided pt with support.  Reiterated safety options at length (ie. [facility] or calling [phone]).  Strongly encouraged pt to contact a friend/family or getting out of the house in order to keep busy.  Reminded pt that she doesn't do well with idle time.  Reiterated structure, structure for her.  Pt states her friend gets off at 5 and she will go out to dinner with her.  Inform treatment team and Hillery Jacks, NP.  R:  Pt receptive.

## 2023-06-07 NOTE — Progress Notes (Signed)
Virtual Visit via Video Note   I connected with [name] on 06/07/23 at  9:00 AM EDT by a video enabled telemedicine application and verified that I am speaking with the correct person using two identifiers.   At orientation to the IOP program, Case Manager discussed the limitations of evaluation and management by telemedicine and the availability of in person appointments. The patient expressed understanding and agreed to proceed with virtual visits throughout the duration of the program.   Location:  Patient: Patient Home Provider: [facility] Office   History of Present Illness: Bipolar Disorder and Borderline Personality Disorder  Observations/Objective: Check In: Case Manager checked in with all participants to review discharge dates, insurance authorizations, work-related documents and needs from the treatment team regarding medications. Mallie stated needs and engaged in discussion.    Initial Therapeutic Activity: Counselor facilitated a check-in with Asharia to assess for safety, sobriety and medication compliance.  Counselor also inquired about Tahtiana's current emotional ratings, as well as any significant changes in thoughts, feelings or behavior since previous check in.  Nyssa presented for session on time and was alert, oriented x5, with no evidence or self-report of active SI/HI or A/V H.  Rosaland reported compliance with medication and denied use of alcohol or illicit substances.  Farhiya reported scores of 6/10 for depression, 6/10 for anxiety, and 0/10 for anger/irritability.  Cheyne denied any recent outbursts.  Tyne reported that a recent struggle was experiencing a panic attack yesterday, stating "After group my anxiety was high and I just couldn't get it together.  I'm not sure what happened".  Sulay reported that a recent success was calling some friends for distraction so that she could calm down.  Presly reported that her goal today is to do bible study later and some homework from her  therapist.         Second Therapeutic Activity: Counselor introduced [employer], [place] Chaplain to provide psychoeducation on topic of Grief and Loss with members today.  Marchelle Folks began discussion by checking in with the group about their baseline mood today, general thoughts on what grief means to them and how it has affected them personally in the past.  Marchelle Folks provided information on how the process of grief/loss can differ depending upon one's unique culture, and categories of loss one could experience (i.e. loss of a person, animal, relationship, job, identity, etc).  Marchelle Folks encouraged members to be mindful of how pervasive loss can be, and how to recognize signs which could indicate that this is having an impact on one's overall mental health and wellbeing.  Intervention was effective, as evidenced by [facility] participating in discussion with speaker on the subject, reporting that this made her reflect upon how her mother coped with the loss of her brother, whom she never knew.  Taleeyah reported that her mother never talked about it, but she could tell it significantly affected her, stating "I can't imagine what she is going through.  Its clear she hasn't worked through it".      Third Therapeutic Activity: Counselor covered topic of attachment styles today.  Counselor virtually shared a handout with the group on this topic which defined attachment styles as how people think about and behave in relationships.  Styles were broken down by category, including secure attachment where one believes close relationships are trustworthy, compared to insecure attachment (i.e. anxious, avoidant, or anxious-avoidant) where one is distrusting or worries about their bond with others.  Counselor inquired about which attachment style members  most related to, how this has influenced their mental health/well-being, and whether they intend to begin making any changes.  Intervention was effective, as evidenced by [facility]  participating in discussion, and reporting that she most identified with the avoidant attachment style due to traits such as being overly rigid, guarded, and distant, uncomfortable with emotions and conflict, and having trouble expressing needs and wants.  Laiyla reported that at one point she felt securely attached to her roommate, but they drifted apart and began to experience more conflict, so now she is more selective about whom she spends time with.  Kristen reported that she plans to face her fears and work to expand her network by joining recovery groups to meet likeminded peers.     Assessment and Plan: Counselor recommends that [facility] remain in IOP treatment to better manage mental health symptoms, ensure stability and pursue completion of treatment plan goals. Counselor recommends adherence to crisis/safety plan, taking medications as prescribed, and following up with medical professionals if any issues arise.    Follow Up Instructions: Counselor will send Webex link for session tomorrow.  Kanylah was advised to call back or seek an in-person evaluation if the symptoms worsen or if the condition fails to improve as anticipated.   Collaboration of Care:   Medication Management AEB Dr. Eliseo Gum or Hillery Jacks, NP                                          Case Manager AEB [name], CNA   Patient/Guardian was advised Release of Information must be obtained prior to any record release in order to collaborate their care with an outside provider. Patient/Guardian was advised if they have not already done so to contact the registration department to sign all necessary forms in order for [place] to release information regarding their care.   Consent: Patient/Guardian gives verbal consent for treatment and assignment of benefits for services provided during this visit. Patient/Guardian expressed understanding and agreed to proceed.  I provided 170 minutes of non-face-to-face time during this encounter.    [name], LCSW, LCAS 06/07/23

## 2023-06-08 ENCOUNTER — Other Ambulatory Visit (HOSPITAL_COMMUNITY): Payer: BC Managed Care – PPO | Admitting: Licensed Clinical Social Worker

## 2023-06-08 ENCOUNTER — Telehealth (HOSPITAL_COMMUNITY): Payer: Self-pay | Admitting: Psychiatry

## 2023-06-08 ENCOUNTER — Emergency Department
Admission: EM | Admit: 2023-06-08 | Discharge: 2023-06-08 | Disposition: A | Discharge status: Home / Self Care | Payer: BC Managed Care – PPO | Attending: Emergency Medicine | Admitting: Emergency Medicine

## 2023-06-08 DIAGNOSIS — F515 Nightmare disorder: Secondary | ICD-10-CM | POA: Insufficient documentation

## 2023-06-08 DIAGNOSIS — F603 Borderline personality disorder: Secondary | ICD-10-CM

## 2023-06-08 DIAGNOSIS — Z91148 Patient's other noncompliance with medication regimen for other reason: Secondary | ICD-10-CM | POA: Insufficient documentation

## 2023-06-08 DIAGNOSIS — F319 Bipolar disorder, unspecified: Secondary | ICD-10-CM | POA: Diagnosis not present

## 2023-06-08 DIAGNOSIS — F4312 Post-traumatic stress disorder, chronic: Secondary | ICD-10-CM | POA: Insufficient documentation

## 2023-06-08 DIAGNOSIS — R45851 Suicidal ideations: Secondary | ICD-10-CM | POA: Insufficient documentation

## 2023-06-08 DIAGNOSIS — F411 Generalized anxiety disorder: Secondary | ICD-10-CM | POA: Diagnosis not present

## 2023-06-08 DIAGNOSIS — Z9151 Personal history of suicidal behavior: Secondary | ICD-10-CM | POA: Diagnosis not present

## 2023-06-08 DIAGNOSIS — Z79899 Other long term (current) drug therapy: Secondary | ICD-10-CM | POA: Insufficient documentation

## 2023-06-08 DIAGNOSIS — F17200 Nicotine dependence, unspecified, uncomplicated: Secondary | ICD-10-CM | POA: Insufficient documentation

## 2023-06-08 LAB — CBC
HCT: 39.1 % (ref 36.0–46.0)
Hemoglobin: 12.3 g/dL (ref 12.0–15.0)
MCH: 29 pg (ref 26.0–34.0)
MCHC: 31.5 g/dL (ref 30.0–36.0)
MCV: 92.2 fL (ref 80.0–100.0)
Platelets: 362 10*3/uL (ref 150–400)
RBC: 4.24 MIL/uL (ref 3.87–5.11)
RDW: 13.7 % (ref 11.5–15.5)
WBC: 9 10*3/uL (ref 4.0–10.5)
nRBC: 0 % (ref 0.0–0.2)

## 2023-06-08 LAB — SALICYLATE LEVEL: Salicylate Lvl: <7 mg/dL — ABNORMAL LOW (ref 7.0–30.0)

## 2023-06-08 LAB — COMPREHENSIVE METABOLIC PANEL
ALT: 21 U/L (ref 0–44)
AST: 22 U/L (ref 15–41)
Albumin: 3.6 g/dL (ref 3.5–5.0)
Alkaline Phosphatase: 69 U/L (ref 38–126)
Anion gap: 10 (ref 5–15)
BUN: 15 mg/dL (ref 6–20)
CO2: 25 mmol/L (ref 22–32)
Calcium: 9.3 mg/dL (ref 8.9–10.3)
Chloride: 105 mmol/L (ref 98–111)
Creatinine, Ser: 0.96 mg/dL (ref 0.44–1.00)
GFR, Estimated: >60 mL/min (ref >60)
Glucose, Bld: 123 mg/dL — ABNORMAL HIGH (ref 70–99)
Potassium: 3.6 mmol/L (ref 3.5–5.1)
Sodium: 140 mmol/L (ref 135–145)
Total Bilirubin: 0.3 mg/dL (ref 0.3–1.2)
Total Protein: 6.9 g/dL (ref 6.5–8.1)

## 2023-06-08 LAB — ETHANOL: Alcohol, Ethyl (B): <10 mg/dL (ref <10)

## 2023-06-08 LAB — ACETAMINOPHEN LEVEL: Acetaminophen (Tylenol), Serum: <10 ug/mL — ABNORMAL LOW (ref 10–30)

## 2023-06-08 NOTE — ED Provider Notes (Signed)
[facility] Provider Note    Event Date/Time   First MD Initiated Contact with Patient 06/08/23 0222     (approximate)   History   Suicidal   HPI   Shelby Gallegos is a 42 y.o. female with history of bipolar disorder and borderline personality disorder who presents for evaluation after expressing suicidal ideation earlier.  The patient states that she has had stable mental health symptoms for a while, and reports that she is compliant with her medications.  She is in a daily outpatient program each morning during the week.  She states she periodically get suicidal ideation but does not act on it.  She states she was having an episode like this tonight.  She did not have any specific plan in mind.  She texted a suicide hotline about her thoughts.  The police were contacted and came to her house.  The patient states that once the police arrived, they advised her that she needed to come to the hospital for an evaluation although she was no longer feeling suicidal.  The patient states that this time she is not having any suicidal thoughts and is feeling better.  She denies any HI.  She denies any hallucinations or other psychiatric symptoms.  The patient states that she has not attempted to harm herself.  She was most recently hospitalized in May after she attempted to overdose.  The patient denies any acute medical complaints.  I reviewed the past medical records.  The patient has notes from daily counseling visits from an LCSW from the last several weeks.  She has had a few telephone encounters also for passive SI which resolved.  She was hospitalized inpatient psychiatry in May.    Physical Exam   Triage Vital Signs: ED Triage Vitals  Enc Vitals Group     BP 06/08/23 0204 (!) 134/95     Pulse Rate 06/08/23 0204 (!) 101     Resp 06/08/23 0204 20     Temp 06/08/23 0204 98.5 F (36.9 C)     Temp Source 06/08/23 0204 Oral     SpO2 06/08/23 0204 98 %     Weight --       Height --      Head Circumference --      Peak Flow --      Pain Score 06/08/23 0205 0     Pain Loc --      Pain Edu? --      Excl. in GC? --     Most recent vital signs: Vitals:   06/08/23 0204  BP: (!) 134/95  Pulse: (!) 101  Resp: 20  Temp: 98.5 F (36.9 C)  SpO2: 98%    General: Awake, no distress.  CV:  Good peripheral perfusion.  Resp:  Normal effort.  Abd:  No distention.  Other:  Calm and cooperative.  Normal affect.   ED Results / Procedures / Treatments   Labs (all labs ordered are listed, but only abnormal results are displayed) Labs Reviewed  COMPREHENSIVE METABOLIC PANEL - Abnormal; Notable for the following components:      Result Value   Glucose, Bld 123 (*)    All other components within normal limits  SALICYLATE LEVEL - Abnormal; Notable for the following components:   Salicylate Lvl <7.0 (*)    All other components within normal limits  ACETAMINOPHEN LEVEL - Abnormal; Notable for the following components:   Acetaminophen (Tylenol), Serum <10 (*)    All  other components within normal limits  ETHANOL  CBC  URINE DRUG SCREEN, QUALITATIVE ([facility] ONLY)  POC URINE PREG, ED     EKG   RADIOLOGY   PROCEDURES:  Critical Care performed: No  Procedures   MEDICATIONS ORDERED IN ED: Medications - No data to display   IMPRESSION / MDM / ASSESSMENT AND PLAN / ED COURSE  I reviewed the triage vital signs and the nursing notes.  42 year old female with PMH as noted above presents after an episode of suicidal ideation that has now resolved.  The patient reports that she has had episodes like this chronically, but denies any recent worsening or escalation of her mental health symptoms.  She states she is in daily counseling sessions and has generally been doing well.  She reports she is compliant with her medications.  She states that she just had a "bad night" but is feeling much better.  Differential diagnosis includes, but is not limited to,  bipolar disorder, adjustment disorder, substance-induced mood disorder.  Patient's presentation is most consistent with exacerbation of chronic illness.  The patient presented voluntarily once the police showed up to her house.  At this time she denies SI or HI.  She had no plan and has not attempted to harm herself.  She is able to contract for safety.  At this time she does not demonstrate any imminent danger to self or others.  I offered her psychiatric evaluation in the ED, however the patient states that she would prefer to continue attending her daily counseling sessions and declines psychiatric evaluation at this time.  There is no indication for involuntary commitment.  Lab workup was obtained for medical screening and shows no significant findings.  Ethanol, APAP, and salicylate levels are all negative.  At this time, the patient is stable for discharge home.  She agrees to follow-up at her daily counseling session later this morning.  I gave her strict return precautions and she expressed understanding.   FINAL CLINICAL IMPRESSION(S) / ED DIAGNOSES   Final diagnoses:  Suicidal thoughts     Rx / DC Orders   ED Discharge Orders     None        Note:  This document was prepared using Dragon voice recognition software and may include unintentional dictation errors.    Dionne Bucy, MD 06/08/23 [phone]

## 2023-06-08 NOTE — Discharge Instructions (Addendum)
Follow-up with your outpatient podiatry provider in the morning as scheduled.  Continue taking your medications as prescribed.  Return to the ER immediately for new, worsening, or recurrent episodes of suicidal ideation, thoughts of wanting to harm yourself, hearing voices, or any other new or worsening symptoms that concern you.  You may also return at any time if you change your mind and wish to see a psychiatrist here for your current symptoms.

## 2023-06-08 NOTE — Progress Notes (Signed)
BH MD/PA/NP OP Progress Note  Virtual Visit via Telephone Note  I connected with Shelby Gallegos on 06/08/23 at  9:00 AM EDT by a video enabled telemedicine application and verified that I am speaking with the correct person using two identifiers.  Location: Patient: Home Provider: Office   I discussed the limitations, risks, security and privacy concerns of performing an evaluation and management service by telephone and the availability of in person appointments. I also discussed with the patient that there may be a patient responsible charge related to this service. The patient expressed understanding and agreed to proceed.   I discussed the assessment and treatment plan with the patient. The patient was provided an opportunity to ask questions and all were answered. The patient agreed with the plan and demonstrated an understanding of the instructions.   The patient was advised to call back or seek an in-person evaluation if the symptoms worsen or if the condition fails to improve as anticipated.   [name], DO  Psych Resident, PGY-3  Name: Shelby Gallegos  MRN:  [id number]  Chief Complaint:  Chief Complaint  Patient presents with   Anxiety   HPI: Shelby Gallegos is a 42 y.o. female with PMH of borderline personality disorder, ?anxiety, PTSD, nicotine use d/o, suicide attempts, inpatient psych admission, who was admitted to [facility] (04/29/2023) then stepped down to IOP (05/26/2023)  [facility] 04/16/2023-04/21/2023 prior to PHP.  Patient was initially seen today due to ED visit last night. During evaluation, patient was calm, engaged, however when it came to specific questions about her medication and adherence, she was vague and gave inconsistent information.   Stated that last night she had a nightmare, so she ended texted 58 who called EMS. EMS then brought her to the ED, where she was discharged home. Stated that she tried to go to bed around 10pm and woke up around 2-4am with a nightmare, texted  988 who called EMS, who brought her to the ED, where she was psych cleared.   Stated that nightmares have come on and off for years, but noted that they have come back about 2 weeks ago. She is unsure of triggers, but when going through her medications, it was noted that she had self restarted previously discontinued adderall against multiple providers' recommendations and had self increased her home prazosin 1 mg to 2 mg. She confirmed that she did pick up the adderall on 7/1 which was around the time she started having nightmares. Discussed with patient again that during PHP/IOP, she cannot be seeing her outpatient psychiatrist Dr. Evelene Croon and will have to reschedule the appointment for after she completes this program.   There was also inconsistencies with how often she is taking the PRN xanax, with her saying she takes it no more than 2x/week to the last PHP/IOP psychiatrist, but now has been taking it daily since discharge from inpatient psych at [facility].   Reported that she takes the adderall around 6PM for bible study and takes the xanax in the AM. Reported that she is unable to concentrate during bible study due to her anxiety. Educated patient on how adderall disturbs sleep and again instructing her to discontinue adderall. Also recommended to cut back on xanax and try substituting vistaril for anxiety. Stated that xanax does make her feel sleepy. The combination of this is likely the cause of her poor sleep and nightmares. Patient stated that she understands and is amenable to the plan.   Confirmed that her current medications are:  Lamictal   Visit Diagnosis:    ICD-10-CM   1. Borderline personality disorder (HCC)  F60.3     2. Nightmares associated with chronic post-traumatic stress disorder  F51.5    F43.12     3. Chronic prescription benzodiazepine use  Z79.899     4. Nicotine use disorder  F17.200     5. Nonadherence to medication  Z91.148     6. Polypharmacy  Z79.899        Past Psychiatric Hx: Previous Psych Diagnoses: Bipolar affective disorder, borderline personality disorder, ADHD, anxiety, PTSD, difficulty coping Prior inpatient treatment: X 3 between [id number].  [facility] in March 2020. [facility] [place] 03/2023 Prior outpatient treatment: PHP in March 2020, intensive outpatient in May 2018 Psychotherapy hx: Has a therapist, [name], at [facility] in [place] History of suicide: Just prior to hospitalization in 2020 History of homicide: Denies Psychiatric medication history: In addition to current regimen, has trialed Caplyta, which does not work well Psychiatric medication compliance history: Reports compliance Neuromodulation history: Denies Current Psychiatrist: Dr. Evelene Croon Current therapist: Has a therapist, [name], at [facility] in [place]   Substance Abuse Hx: Alcohol: Reports prior alcohol use history with sobriety maintained x 3 years.  Relapsed on 5/16. Tobacco: Reports nicotine vape use Illicit drugs: Denies Rx drug abuse: Denies Rehab hx: Denies; has been attending AA meetings for the past 3 years   Past Medical History: Medical Diagnoses: Hypothyroidism, chronic migraines Home Rx: See above Prior Hosp: Denies Head trauma, LOC, concussions, seizures: Denies Allergies: Onion-triggers migraines LMP: 03/15/2023 Contraception: Denies PCP: [name], PA-C   Family History: Medical: See chart Psych: Paternal uncle-bipolar disorder Psych Rx: Unsure SA/HA: Denies Substance use family hx: Paternal family with rampant alcohol use  Social History: Abuse: Denies Marital Status: Single Children: None Employment: Employed by a life Company secretary: Oncologist from [employer]: Currently owns her home, but living with her boss due to ongoing situation with roommate Finances: Employment Legal: Denies Hotel manager: Denies  Past Medical History:  Past Medical History:  Diagnosis Date   ADHD (attention deficit  hyperactivity disorder)    Bipolar 1 disorder (HCC)    Migraine    Suicide attempt by drug overdose (HCC) 02/15/2019    Past Surgical History:  Procedure Laterality Date   NO PAST SURGERIES     TENDON REPAIR Bilateral 02/20/2019   Procedure: TENDON REPAIR BILATERAL FOREARMS;  Surgeon: [name], MD;  Location: [facility];  Service: Orthopedics;  Laterality: Bilateral;   WOUND EXPLORATION Bilateral 02/20/2019   Procedure: WOUND EXPLORATION;  Surgeon: [name], MD;  Location: [facility];  Service: Orthopedics;  Laterality: Bilateral;   Family History:  Family History  Problem Relation Age of Onset   Atrial fibrillation Mother    Bipolar disorder Paternal Uncle    Alcohol abuse Paternal Uncle    Depression Paternal Uncle    OCD Paternal Uncle    Social History:  Social History   Socioeconomic History   Marital status: Single    Spouse name: Not on file   Number of children: 0   Years of education: Not on file   Highest education level: Bachelor's degree (e.g., BA, AB, BS)  Occupational History   Not on file  Tobacco Use   Smoking status: Every Day    Types: Cigarettes, E-cigarettes   Smokeless tobacco: Never   Tobacco comments:    Reports she vaps for past year.   Vaping Use   Vaping Use: Every day  Start date: 11/29/2021   Substances: Nicotine, Flavoring  Substance and Sexual Activity   Alcohol use: Not Currently    Comment: Reported last use on 04/14/23 when she stated trying to overdose, none since.   Drug use: No   Sexual activity: Not Currently    Birth control/protection: None  Other Topics Concern   Not on file  Social History Narrative   Not on file   Social Determinants of Health   Financial Resource Strain: Low Risk  (03/30/2023)   Overall Financial Resource Strain (CARDIA)    Difficulty of Paying Living Expenses: Not very hard  Food Insecurity: No Food Insecurity (04/15/2023)   Hunger Vital Sign    Worried About  Running Out of Food in the Last Year: Never true    Ran Out of Food in the Last Year: Never true  Transportation Needs: No Transportation Needs (04/15/2023)   PRAPARE - Administrator, Civil Service (Medical): No    Lack of Transportation (Non-Medical): No  Physical Activity: Insufficiently Active (03/30/2023)   Exercise Vital Sign    Days of Exercise per Week: 2 days    Minutes of Exercise per Session: 20 min  Stress: Stress Concern Present (03/30/2023)   [employer] of Occupational Health - Occupational Stress Questionnaire    Feeling of Stress : Rather much  Social Connections: Moderately Integrated (03/30/2023)   Social Connection and Isolation Panel [NHANES]    Frequency of Communication with Friends and Family: More than three times a week    Frequency of Social Gatherings with Friends and Family: More than three times a week    Attends Religious Services: More than 4 times per year    Active Member of [employer] or Organizations: Yes    Attends Banker Meetings: More than 4 times per year    Marital Status: Never married    Allergies:  Allergies  Allergen Reactions   Onion     Triggers migraines    Metabolic Disorder Labs: Lab Results  Component Value Date   HGBA1C 5.8 (H) 04/14/2023   MPG 119.76 04/14/2023   MPG 114.02 02/19/2019   No results found for: "PROLACTIN" Lab Results  Component Value Date   CHOL 224 (H) 04/14/2023   TRIG 142 04/14/2023   HDL 55 04/14/2023   CHOLHDL 4.1 04/14/2023   VLDL 28 04/14/2023   LDLCALC 141 (H) 04/14/2023   LDLCALC 130 (H) 03/31/2023   Lab Results  Component Value Date   TSH 3.100 04/14/2023   TSH 2.750 03/31/2023    Therapeutic Level Labs: No results found for: "LITHIUM" No results found for: "VALPROATE" No results found for: "CBMZ"  Current Medications: Current Outpatient Medications  Medication Sig Dispense Refill   ALPRAZolam (XANAX) 0.5 MG tablet Take 0.5 mg by mouth daily as needed for  anxiety.     amphetamine-dextroamphetamine (ADDERALL) 20 MG tablet Take 20 mg by mouth in the morning, at noon, and at bedtime.     cariprazine (VRAYLAR) 3 MG capsule Take 1 capsule (3 mg total) by mouth every evening. 30 capsule 0   cyanocobalamin (VITAMIN B12) 1000 MCG/ML injection Inject 1 mL (1,000 mcg total) into the muscle once for 1 dose. Inject 1 mL into the muscle once a week for 3 weeks and then once monthly 4 mL 5   Erenumab-aooe (AIMOVIG) 140 MG/ML SOAJ Inject 140 mg into the skin every 30 (thirty) days. 1.12 mL 11   hydrOXYzine (ATARAX) 10 MG tablet Take 1 tablet (10  mg total) by mouth daily. 90 tablet 0   lamoTRIgine (LAMICTAL) 200 MG tablet Take 400 mg by mouth at bedtime.     nicotine (NICODERM CQ - DOSED IN MG/24 HOURS) 14 mg/24hr patch Place 1 patch (14 mg total) onto the skin daily. 28 patch 0   prazosin (MINIPRESS) 1 MG capsule Take 1 capsule (1 mg total) by mouth at bedtime. 30 capsule 0   Rimegepant Sulfate (NURTEC) 75 MG TBDP Take 75 mg by mouth as needed (For migraines).     sertraline (ZOLOFT) 25 MG tablet Take 1 tablet (25 mg total) by mouth at bedtime. 30 tablet 0   SUMAtriptan 6 MG/0.5ML SOAJ Inject 6 mg into the skin as needed. (Patient taking differently: Inject 6 mg into the skin every 2 (two) hours as needed (For migraines or headaches).) 15 mL 3   Ubrogepant (UBRELVY) 100 MG TABS Take 1 tablet (100 mg total) by mouth as needed. 10 tablet 3   No current facility-administered medications for this visit.   Psychiatric Specialty Exam: Review of Systems  Respiratory:  Negative for shortness of breath.   Cardiovascular:  Negative for chest pain.  Gastrointestinal:  Negative for abdominal pain, constipation and diarrhea.  Neurological:  Negative for dizziness and light-headedness.    There were no vitals taken for this visit.There is no height or weight on file to calculate BMI.  General Appearance: Casual and Fairly Groomed   Eye Contact:  Good   Speech:  Clear  and Coherent and Normal Rate   Volume:  Normal  Mood:  Anxious and Depressed  Affect:  Appropriate, Congruent, and Full Range  Thought Process:  Coherent, Goal Directed, and Linear  Orientation:  Full (Time, Place, and Person)  Thought Content: Illogical and Rumination   Suicidal Thoughts:  No  Homicidal Thoughts:  No  Memory:  Immediate;   Good  Judgement:  Poor  Insight:  Shallow  Psychomotor Activity:  Normal  Concentration:  Concentration: Good and Attention Span: Good  Recall:  Good  Fund of Knowledge: Good  Language: Good  Akathisia:  No  Handed:  Right  AIMS (if indicated): not done, virtual  Assets:   Communication Skills Desire for Improvement Financial Resources/Insurance Housing Leisure Time Physical Health Resilience Social Support Talents/Skills  ADL's:  Intact  Cognition: WNL  Sleep:  Fair   Screenings: AIMS    Flowsheet Row [facility]  AIMS Total Score 0      GAD-7    Flowsheet Row Counselor from 04/29/2023 in BEHAVIORAL HEALTH PARTIAL HOSPITALIZATION PROGRAM  Total GAD-7 Score 19      PHQ2-9    Flowsheet Row Counselor from 05/25/2023 in BEHAVIORAL HEALTH INTENSIVE PSYCH Counselor from 04/29/2023 in BEHAVIORAL HEALTH PARTIAL HOSPITALIZATION PROGRAM Counselor from 04/26/2023 in BEHAVIORAL HEALTH PARTIAL HOSPITALIZATION PROGRAM Office Visit from 03/30/2023 in [facility] [place] [facility] Office Visit from 01/14/2022 in [facility]  PHQ-2 Total Score 4 3 4 3 2   PHQ-9 Total Score 18 16 19 5 6       Flowsheet Row ED from 06/08/2023 in [facility] Counselor from 05/25/2023 in BEHAVIORAL HEALTH INTENSIVE PSYCH Counselor from 04/29/2023 in BEHAVIORAL HEALTH PARTIAL HOSPITALIZATION PROGRAM  C-SSRS RISK CATEGORY High Risk Error: Question 6 not populated No Risk       Assessment and Plan:   Borderline  Personality d/o  MDD Continued home lamictal 400 mg at bedtime Continued home  vraylar 3 mg at bedtime Continued zoloft 25 mg daily  PTSD  Continued home prazosin 1 mg at bedtime SSRI per above  ? Anxiety  Chronic Prescription Benzodiazepine use Vague anxiety dx per chart, unclear if GAD. Recommended tapering xanax off with her outpatient psychiatrist and maximizing first line treatment with SSRI  Continued home xanax 0.5 mg daily PRN SSRI per above  Polypharmacy  Medication Non-adherence Patient with clear instructions to discontinue stimulant and has been recommended to use benzo as needed only. However, patient continues to use adderall, which is most likely cause of her nightmares and poor sleep given timeline of onset of sxs and her re-starting adderall again. Patient continues to see outpatient psychiatrist while at PHP/IOP, despite instructions that patient is to hold off on seeing outpatient psychiatrist until she completes PHP/IOP to avoid polypharmacy. Patient was instructed again to stop previously discontinued adderall Xanax per above  Rx at [facility]: home lamictal 400 mg at bedtime, home vraylar 3 mg at bedtime, home prazosin 2 mg at bedtime, home xanax 0.5 mg daily PRN  Med changes made at PHP/IOP: - started zoloft - decreased home prazosin from 2 mg to 1 mg qHS  Collaboration of Care: Patient is to continue therapy and follow-up with their outpatient provider  Patient/Guardian was advised Release of Information must be obtained prior to any record release in order to collaborate their care with an outside provider. Patient/Guardian was advised if they have not already done so to contact the registration department to sign all necessary forms in order for [place] to release information regarding their care.   Consent: Patient/Guardian gives verbal consent for treatment and assignment of benefits for services provided during this visit. Patient/Guardian expressed understanding  and agreed to proceed.    [name], DO Psych Resident, PGY-3 06/08/2023, 7:57 PM

## 2023-06-08 NOTE — ED Notes (Signed)
Dr. Marisa Severin in triage to see pt at this time.

## 2023-06-08 NOTE — ED Triage Notes (Signed)
Pt states that she was having suicidal thoughts earlier, plan was to overdose, called the suicidal hotline and they sent PD to her house for a welfare check, pt denies any attempts tonight or suicidal thoughts at this time. Denies HI/AVH

## 2023-06-08 NOTE — Progress Notes (Signed)
Virtual Visit via Video Note   I connected with [name] on 06/08/23 at  9:00 AM EDT by a video enabled telemedicine application and verified that I am speaking with the correct person using two identifiers.   At orientation to the IOP program, Case Manager discussed the limitations of evaluation and management by telemedicine and the availability of in person appointments. The patient expressed understanding and agreed to proceed with virtual visits throughout the duration of the program.   Location:  Patient: Patient Home Provider: OPT BH Office   History of Present Illness: Bipolar Disorder and Borderline Personality Disorder  Observations/Objective: Check In: Case Manager checked in with all participants to review discharge dates, insurance authorizations, work-related documents and needs from the treatment team regarding medications. Babbette stated needs and engaged in discussion.    Initial Therapeutic Activity: Counselor facilitated a check-in with Renise to assess for safety, sobriety and medication compliance.  Counselor also inquired about Allsion's current emotional ratings, as well as any significant changes in thoughts, feelings or behavior since previous check in.  Monrovia presented for session on time and was alert, oriented x5, with no evidence or self-report of active SI/HI or A/V H.  Makiyla reported compliance with medication and denied use of alcohol or illicit substances.  Tiwanda reported scores of 6/10 for depression, 5/10 for anxiety, and 0/10 for anger/irritability.  Carrington denied any recent outbursts.  Adele reported that a recent success was attending an online recovery meeting in the afternoon, as well as getting out of the house to run errands.  Suzette reported that a recent struggle was experiencing a panic attack yesterday afternoon, which led to passive SI around 11pm.  Maleka reported that she called a crisis line, and then went to the hospital for assessment.  Grant reported  that she found staff to be supportive, these thoughts subsided, and she will continue to follow safety plan when experiencing any suicidal ideation.  Denajah reported that her goal today is to attend bible study later this evening.        Second Therapeutic Activity: Counselor engaged the group in discussion on managing work/life balance today to improve mental health and wellness.  Counselor explained how finding balance between responsibilities at home and work place can be challenging, and lead to increased stress.  Counselor facilitated discussion on what challenges members are currently, or have historically faced.  Counselor also discussed strategies for improving work/life balance while members work on their mental health during treatment.  Some of these included keeping track of time management; creating a list of priorities and scaling importance; setting realistic, measurable goals each day; establishing boundaries; taking care of health needs; and nurturing relationships at home and work for support.  Counselor inquired about areas where members feel they are excelling, as well as areas they could focus on during treatment. 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  Alayzia stated "That took a toll on my mental health.  It could even flip me into mania".  Vanesha reported that she experienced several symptoms of burnout, including changing sleep habits, lowered immunity, isolating from others, feeling tired and drained, lacking motivation, and feeling overwhelmed.  Christl reported that there have also been numerous warning signs such as feeling like she can't get away from her phone or computer,  working through lunch, feeling emotionally and physically drained during and after shifts, and missing out  on social engagements because of her schedule.  Chanel was receptive to suggestions offered today for addressing work life imbalance, including setting aside more time in her schedule for social engagements through church and recovery meetings, keeping a time log for one week of personal and work related activities, creating a Field seismologist each day to structure her schedule more realistically, working on refusal skills to set boundaries at work, and taking walks more often to get adequate exercise.    Assessment and Plan: Counselor recommends that [facility] remain in IOP treatment to better manage mental health symptoms, ensure stability and pursue completion of treatment plan goals. Counselor recommends adherence to crisis/safety plan, taking medications as prescribed, and following up with medical professionals if any issues arise.    Follow Up Instructions: Counselor will send Webex link for session tomorrow.  Pualani was advised to call back or seek an in-person evaluation if the symptoms worsen or if the condition fails to improve as anticipated.   Collaboration of Care:   Medication Management AEB Dr. Eliseo Gum or Hillery Jacks, NP                                          Case Manager AEB [name], CNA   Patient/Guardian was advised Release of Information must be obtained prior to any record release in order to collaborate their care with an outside provider. Patient/Guardian was advised if they have not already done so to contact the registration department to sign all necessary forms in order for [place] to release information regarding their care.   Consent: Patient/Guardian gives verbal consent for treatment and assignment of benefits for services provided during this visit. Patient/Guardian expressed understanding and agreed to proceed.  I provided 165 minutes of non-face-to-face time during this encounter.   [name], LCSW, LCAS 06/08/23

## 2023-06-08 NOTE — Telephone Encounter (Signed)
D:  Patient had sent MH-IOP Case manager two emails during the night.  The first was stating that she was having a rough night and the second was stating she had gone to [facility] ED.  Met with pt along with Drs. Cyndie Chime and [place].  According to pt, she attended a meeting yesterday.  Went home and took her medications as directed on the bottles, then went to bed.  She said her father called her at 1 a.m to tell her that a Sheriff had called him re: her and he was going to pt's home.  "The sheriff came to do a safety check.  I told him that I was fine.  I had called [phone] earlier, so he said that the police would be coming to my home to take me to the ED for an evaluation."  Pt states her nights are always very "dark, but then I am ok in the morning."  Pt reports increased anxiety and nightmares.  "I haven't been participating in group b/c groups make me anxious." Pt was discharged from the ED back to virtual MH-IOP this morning.  Drs. Cyndie Chime and Corona went through all of patients medications with pt.  Educated and answered all her questions. Inquired if pt's therapist was back from vacation?  Pt states she is and normally she sends her emails like she has been sending the case manager, with updates as to how she is feeling during those dark times.  Inquired if the current level of care is working for pt?  According to pt, it is helping with learning and applying the skills.  Pt did admit that she didn't f/u with [facility] the last time she was in IOP.  "I don't want to leave my current therapist though."  Explained to pt, the DBT Groups would be in addition to her therapist and psychiatrist. Pt currently denies SI.  States she is planning to go to bible study this evening.  A:  Provided pt with support, but set firm boundaries with pt.  Strongly recommended pt to participate in one of her hobbies; not to sit at home with idle time on her hands.  Will refer pt to [facility].  Pt will be discharged  as scheduled 06-15-23.  R:  Pt receptive.

## 2023-06-09 ENCOUNTER — Telehealth (HOSPITAL_COMMUNITY): Payer: Self-pay | Admitting: Psychiatry

## 2023-06-09 ENCOUNTER — Other Ambulatory Visit (HOSPITAL_COMMUNITY): Payer: BC Managed Care – PPO

## 2023-06-09 DIAGNOSIS — F4312 Post-traumatic stress disorder, chronic: Secondary | ICD-10-CM | POA: Diagnosis not present

## 2023-06-10 ENCOUNTER — Other Ambulatory Visit (HOSPITAL_COMMUNITY): Payer: BC Managed Care – PPO | Admitting: Psychiatry

## 2023-06-10 ENCOUNTER — Telehealth (HOSPITAL_COMMUNITY): Payer: Self-pay | Admitting: Psychiatry

## 2023-06-10 DIAGNOSIS — F4312 Post-traumatic stress disorder, chronic: Secondary | ICD-10-CM | POA: Diagnosis not present

## 2023-06-10 DIAGNOSIS — F314 Bipolar disorder, current episode depressed, severe, without psychotic features: Secondary | ICD-10-CM

## 2023-06-10 DIAGNOSIS — F603 Borderline personality disorder: Secondary | ICD-10-CM | POA: Diagnosis not present

## 2023-06-10 DIAGNOSIS — F411 Generalized anxiety disorder: Secondary | ICD-10-CM | POA: Diagnosis not present

## 2023-06-10 DIAGNOSIS — Z9151 Personal history of suicidal behavior: Secondary | ICD-10-CM | POA: Diagnosis not present

## 2023-06-10 NOTE — Progress Notes (Signed)
Virtual Visit via Video Note   I connected with Shelby Gallegos on 06/10/23 at  9:00 AM EDT by a video enabled telemedicine application and verified that I am speaking with the correct person using two identifiers.   At orientation to the IOP program, Case Manager discussed the limitations of evaluation and management by telemedicine and the availability of in person appointments. The patient expressed understanding and agreed to proceed with virtual visits throughout the duration of the program.   Location:  Patient: Patient Home Provider: Clinical Home Office   History of Present Illness: Bipolar Disorder and Borderline Personality Disorder  Observations/Objective: Check In: Case Manager checked in with all participants to review discharge dates, insurance authorizations, work-related documents and needs from the treatment team regarding medications. Shelby Gallegos stated needs and engaged in discussion.    Initial Therapeutic Activity: Counselor facilitated a check-in with Shelby Gallegos to assess for safety, sobriety and medication compliance.  Counselor also inquired about Shelby Gallegos's current emotional ratings, as well as any significant changes in thoughts, feelings or behavior since previous check in.  Shelby Gallegos presented for session on time and was alert, oriented x5, with no evidence or self-report of active SI/HI or A/V H.  Shelby Gallegos reported compliance with medication and denied use of alcohol or illicit substances.  Shelby Gallegos reported scores of 4/10 for depression, 5/10 for anxiety, and 0/10 for anger/irritability.  Shelby Gallegos denied any recent outbursts or panic attacks.  Shelby Gallegos reported that a recent success was attending an individual session with her therapist yesterday, although this led her to miss group.  She reported that she also slept more soundly last night.  Shelby Gallegos denied any new struggles.  Shelby Gallegos reported that her goal today is to attend a baseball game this evening with a friend.       Second Therapeutic Activity:  Counselor introduced topic of assertive communication today.  Counselor shared various handouts with members virtually in group to read along with on the subject.  These handouts defined assertive communication as a communication style in which a person stands up for their own needs and wants, while also taking into consideration the needs and wants of others, without behaving in a passive or aggressive way.  Traits of assertive communicators were highlighted such as using appropriate speaking volume, maintaining eye contact, using confident language, and avoiding interruption.  Members were also provided with tips on how to improve communication, including respecting oneself, expressing thoughts and feelings calmly, and saying "No" when necessary.  Members were given a variety of scenarios where they could practice using these tips to respond in an assertive manner.  Intervention was effective, as evidenced by Shelby Gallegos participating in discussion on topic, reporting that she has a passive communication style due to traits such lacking confidence, having poor eye contact, looking down or away a lot, and not expressing her own needs or wants.  Shelby Gallegos reported that this led to major issues with her roommate that moved out recently, as they had a very aggressive, demanding personality, and Shelby Gallegos eventually became resentful of giving into her bullying demeanor all the time, stating "Sometimes I don't speak up, and just kinda go with the flow".  Shelby Gallegos showed more effective use of assertive communication skills through engagement in roleplay activities.    Third Therapeutic Activity: Psycho-educational portion of group was provided by Shelby Gallegos, Interior and spatial designer of community education with [employer].  Shelby Gallegos provided information on history of her local agency, mission statement, and the variety of unique services offered which group members might  find beneficial to engage in, including both virtual and in-person  support groups, as well as peer support program for mentoring.  Shelby Gallegos offered time to answer member's questions regarding services and encouraged them to consider utilizing these services to assist in working towards their individual wellness goals.  Intervention effectiveness could not be measured, as client did not participate.    Assessment and Plan: Counselor recommends that [facility] remain in IOP treatment to better manage mental health symptoms, ensure stability and pursue completion of treatment plan goals. Counselor recommends adherence to crisis/safety plan, taking medications as prescribed, and following up with medical professionals if any issues arise.    Follow Up Instructions: Counselor will send Webex link for session tomorrow.  Shelby Gallegos was advised to call back or seek an in-person evaluation if the symptoms worsen or if the condition fails to improve as anticipated.   Collaboration of Care:   Medication Management AEB Dr. Eliseo Gum or Shelby Jacks, NP                                          Case Manager AEB [name], CNA   Patient/Guardian was advised Release of Information must be obtained prior to any record release in order to collaborate their care with an outside provider. Patient/Guardian was advised if they have not already done so to contact the registration department to sign all necessary forms in order for [place] to release information regarding their care.   Consent: Patient/Guardian gives verbal consent for treatment and assignment of benefits for services provided during this visit. Patient/Guardian expressed understanding and agreed to proceed.  I provided 180 minutes of non-face-to-face time during this encounter.   Shelby Stain, LCSW, LCAS 06/10/23

## 2023-06-10 NOTE — Telephone Encounter (Signed)
D:  Pt requested MH-IOP CM to call her during break.  Pt states she had appt with therapist [name]) yesterday and it went well.  Reports she shared about the recommendation of her f/u with [facility].  "My therapist had various questions about the length of time?  She also wanted to know if I can be extended to continue in group?"  A:  Provided pt with support.  Redirected pt along with her therapist to [facility] to get all their questions answered.  Reiterated MH-IOP LOS and beyond 15 days the curriculum starts to repeat itself.  Strongly encouraged pt to f/u with [facility], [employer], AA, or [facility] in addition to her individual counseling (1-2 times weekly) and medication mgmt.  Will d/c as scheduled on 06-15-23.  R:  Pt receptive.

## 2023-06-13 ENCOUNTER — Other Ambulatory Visit (HOSPITAL_COMMUNITY): Payer: BC Managed Care – PPO | Admitting: Psychiatry

## 2023-06-13 ENCOUNTER — Telehealth (HOSPITAL_COMMUNITY): Payer: Self-pay | Admitting: Psychiatry

## 2023-06-13 DIAGNOSIS — Z9151 Personal history of suicidal behavior: Secondary | ICD-10-CM | POA: Diagnosis not present

## 2023-06-13 DIAGNOSIS — F603 Borderline personality disorder: Secondary | ICD-10-CM | POA: Diagnosis not present

## 2023-06-13 DIAGNOSIS — F4312 Post-traumatic stress disorder, chronic: Secondary | ICD-10-CM | POA: Diagnosis not present

## 2023-06-13 DIAGNOSIS — F17200 Nicotine dependence, unspecified, uncomplicated: Secondary | ICD-10-CM

## 2023-06-13 DIAGNOSIS — Z79899 Other long term (current) drug therapy: Secondary | ICD-10-CM

## 2023-06-13 DIAGNOSIS — F411 Generalized anxiety disorder: Secondary | ICD-10-CM

## 2023-06-13 DIAGNOSIS — F900 Attention-deficit hyperactivity disorder, predominantly inattentive type: Secondary | ICD-10-CM | POA: Diagnosis not present

## 2023-06-13 DIAGNOSIS — Z91148 Patient's other noncompliance with medication regimen for other reason: Secondary | ICD-10-CM

## 2023-06-13 DIAGNOSIS — F3174 Bipolar disorder, in full remission, most recent episode manic: Secondary | ICD-10-CM | POA: Diagnosis not present

## 2023-06-13 DIAGNOSIS — F3131 Bipolar disorder, current episode depressed, mild: Secondary | ICD-10-CM | POA: Diagnosis not present

## 2023-06-13 NOTE — Progress Notes (Unsigned)
BH MD/PA/NP OP Progress Note  Virtual Visit via Telephone Note  I connected with Shelby Gallegos on 06/13/23 at  9:00 AM EDT by a video enabled telemedicine application and verified that I am speaking with the correct person using two identifiers.  Location: Patient: Home Provider: Office   I discussed the limitations, risks, security and privacy concerns of performing an evaluation and management service by telephone and the availability of in person appointments. I also discussed with the patient that there may be a patient responsible charge related to this service. The patient expressed understanding and agreed to proceed.   I discussed the assessment and treatment plan with the patient. The patient was provided an opportunity to ask questions and all were answered. The patient agreed with the plan and demonstrated an understanding of the instructions.   The patient was advised to call back or seek an in-person evaluation if the symptoms worsen or if the condition fails to improve as anticipated.   [name], DO  Psych Resident, PGY-3  Name: Shelby Gallegos  MRN:  [id number]  Chief Complaint:  Chief Complaint  Patient presents with   borderline personality disorder   HPI: Shelby Gallegos is a 42 y.o. female with PMH of borderline personality disorder, GAD, PTSD, nicotine use d/o, chronic benzodiazepine use, prescription stimulant misuse, suicide attempts, inpatient psych admission, who was admitted to [facility] (04/29/2023) then stepped down to IOP (05/26/2023)  [facility] 04/16/2023-04/21/2023 prior to PHP.  Patient reported that her weekend was "good". Reported that her sleep was pretty decent this past week since last appointment except last night. Difficulty falling asleep. Reported some caffeine intake, mainly mountain dew during the day time and does drink it in the evening sometimes. Noted that she did drink mountain dew last night and noted vaping before bed as well. Stated that she forgot to use  the nicotine patch yesterday and was vaping more yesterday because of it. She denied any adderall use since last appointment as well. She is still attending Bible study, which is once a week. Hydroxyzine was helpful for anxiety, reported using it every other day. Xanax 2x this week, Friday before the game was the last time. Appetite is stable and intact.   Denied active and passive SI. Last time having SI was Wednesday, when ER Denied HI, AVH, paranoia  Did get online to start the process for DBT  Denied medication side effects.   Visit Diagnosis:    ICD-10-CM   1. Borderline personality disorder (HCC)  F60.3     2. GAD (generalized anxiety disorder)  F41.1     3. Nicotine use disorder  F17.200     4. Chronic prescription benzodiazepine use  Z79.899     5. Nonadherence to medication  Z91.148     6. Polypharmacy  Z79.899       Past Psychiatric Hx: Previous Psych Diagnoses: Bipolar affective disorder, borderline personality disorder, ADHD, anxiety, PTSD, difficulty coping Prior inpatient treatment: X 3 between [id number].  [facility] in March 2020. [facility] 03/2023 Prior outpatient treatment: PHP in March 2020, intensive outpatient in May 2018 Psychotherapy hx: Has a therapist, [name], at [facility] in [place] History of suicide: Just prior to hospitalization in 2020 History of homicide: Denies Psychiatric medication history: In addition to current regimen, has trialed Caplyta, which does not work well Psychiatric medication compliance history: Reports compliance Neuromodulation history: Denies Current Psychiatrist: Dr. Evelene Croon Current therapist: Has a therapist, [name], at [facility] in [place]   Substance Abuse Hx: Alcohol: Reports  prior alcohol use history with sobriety maintained x 3 years.  Relapsed on 5/16. Tobacco: Reports nicotine vape use Illicit drugs: Denies Rx drug abuse: Chronic benzodiazepine use, prescription adderall misuse Rehab hx: Denies; has been  attending AA meetings for the past 3 years   Past Medical History: Medical Diagnoses: Hypothyroidism, chronic migraines Home Rx: See above Prior Hosp: Denies Head trauma, LOC, concussions, seizures: Denies Allergies: Onion-triggers migraines LMP: 03/15/2023 Contraception: Denies PCP: [name], PA-C @ [facility]    Family History: Medical: See chart Psych: Paternal uncle-bipolar disorder Psych Rx: Unsure SA/HA: Denies Substance use family hx: Paternal family with rampant alcohol use  Social History: Abuse: Denies Marital Status: Single Children: None Employment: Employed by a life Engineer, site Education: Oncologist from [employer]: Currently owns her home, but living with her boss due to ongoing situation with roommate Finances: Employment Legal: Denies Hotel manager: Denies  Past Medical History:  Past Medical History:  Diagnosis Date   ADHD (attention deficit hyperactivity disorder)    Bipolar 1 disorder (HCC)    Migraine    Suicide attempt by drug overdose (HCC) 02/15/2019    Past Surgical History:  Procedure Laterality Date   NO PAST SURGERIES     TENDON REPAIR Bilateral 02/20/2019   Procedure: TENDON REPAIR BILATERAL FOREARMS;  Surgeon: [name], MD;  Location: [facility];  Service: Orthopedics;  Laterality: Bilateral;   WOUND EXPLORATION Bilateral 02/20/2019   Procedure: WOUND EXPLORATION;  Surgeon: [name], MD;  Location: [facility];  Service: Orthopedics;  Laterality: Bilateral;   Family History:  Family History  Problem Relation Age of Onset   Atrial fibrillation Mother    Bipolar disorder Paternal Uncle    Alcohol abuse Paternal Uncle    Depression Paternal Uncle    OCD Paternal Uncle    Social History:  Social History   Socioeconomic History   Marital status: Single    Spouse name: Not on file   Number of children: 0   Years of education: Not on file   Highest education  level: Bachelor's degree (e.g., BA, AB, BS)  Occupational History   Not on file  Tobacco Use   Smoking status: Every Day    Types: Cigarettes, E-cigarettes   Smokeless tobacco: Never   Tobacco comments:    Reports she vaps for past year.   Vaping Use   Vaping status: Every Day   Start date: 11/29/2021   Substances: Nicotine, Flavoring  Substance and Sexual Activity   Alcohol use: Not Currently    Comment: Reported last use on 04/14/23 when she stated trying to overdose, none since.   Drug use: No   Sexual activity: Not Currently    Birth control/protection: None  Other Topics Concern   Not on file  Social History Narrative   Not on file   Social Determinants of Health   Financial Resource Strain: Low Risk  (03/30/2023)   Overall Financial Resource Strain (CARDIA)    Difficulty of Paying Living Expenses: Not very hard  Food Insecurity: No Food Insecurity (04/15/2023)   Hunger Vital Sign    Worried About Running Out of Food in the Last Year: Never true    Ran Out of Food in the Last Year: Never true  Transportation Needs: No Transportation Needs (04/15/2023)   PRAPARE - Administrator, Civil Service (Medical): No    Lack of Transportation (Non-Medical): No  Physical Activity: Insufficiently Active (03/30/2023)   Exercise Vital  Sign    Days of Exercise per Week: 2 days    Minutes of Exercise per Session: 20 min  Stress: Stress Concern Present (03/30/2023)   [employer] of Occupational Health - Occupational Stress Questionnaire    Feeling of Stress : Rather much  Social Connections: Moderately Integrated (03/30/2023)   Social Connection and Isolation Panel [NHANES]    Frequency of Communication with Friends and Family: More than three times a week    Frequency of Social Gatherings with Friends and Family: More than three times a week    Attends Religious Services: More than 4 times per year    Active Member of [employer] or Organizations: Yes    Attends Tax inspector Meetings: More than 4 times per year    Marital Status: Never married    Allergies:  Allergies  Allergen Reactions   Onion     Triggers migraines    Metabolic Disorder Labs: Lab Results  Component Value Date   HGBA1C 5.8 (H) 04/14/2023   MPG 119.76 04/14/2023   MPG 114.02 02/19/2019   No results found for: "PROLACTIN" Lab Results  Component Value Date   CHOL 224 (H) 04/14/2023   TRIG 142 04/14/2023   HDL 55 04/14/2023   CHOLHDL 4.1 04/14/2023   VLDL 28 04/14/2023   LDLCALC 141 (H) 04/14/2023   LDLCALC 130 (H) 03/31/2023   Lab Results  Component Value Date   TSH 3.100 04/14/2023   TSH 2.750 03/31/2023    Therapeutic Level Labs: No results found for: "LITHIUM" No results found for: "VALPROATE" No results found for: "CBMZ"  Current Medications: Current Outpatient Medications  Medication Sig Dispense Refill   ALPRAZolam (XANAX) 0.5 MG tablet Take 0.5 mg by mouth daily as needed for anxiety.     amphetamine-dextroamphetamine (ADDERALL) 20 MG tablet Take 20 mg by mouth in the morning, at noon, and at bedtime.     cariprazine (VRAYLAR) 3 MG capsule Take 1 capsule (3 mg total) by mouth every evening. 30 capsule 0   cyanocobalamin (VITAMIN B12) 1000 MCG/ML injection Inject 1 mL (1,000 mcg total) into the muscle once for 1 dose. Inject 1 mL into the muscle once a week for 3 weeks and then once monthly 4 mL 5   Erenumab-aooe (AIMOVIG) 140 MG/ML SOAJ Inject 140 mg into the skin every 30 (thirty) days. 1.12 mL 11   hydrOXYzine (ATARAX) 10 MG tablet Take 1 tablet (10 mg total) by mouth daily. 90 tablet 0   lamoTRIgine (LAMICTAL) 200 MG tablet Take 400 mg by mouth at bedtime.     nicotine (NICODERM CQ - DOSED IN MG/24 HOURS) 14 mg/24hr patch Place 1 patch (14 mg total) onto the skin daily. 28 patch 0   prazosin (MINIPRESS) 1 MG capsule Take 1 capsule (1 mg total) by mouth at bedtime. 30 capsule 0   Rimegepant Sulfate (NURTEC) 75 MG TBDP Take 75 mg by mouth as needed  (For migraines).     sertraline (ZOLOFT) 25 MG tablet Take 1 tablet (25 mg total) by mouth at bedtime. 30 tablet 0   SUMAtriptan 6 MG/0.5ML SOAJ Inject 6 mg into the skin as needed. (Patient taking differently: Inject 6 mg into the skin every 2 (two) hours as needed (For migraines or headaches).) 15 mL 3   Ubrogepant (UBRELVY) 100 MG TABS Take 1 tablet (100 mg total) by mouth as needed. 10 tablet 3   No current facility-administered medications for this visit.   Psychiatric Specialty Exam: Review of Systems  Respiratory:  Negative for shortness of breath.   Cardiovascular:  Negative for chest pain.  Gastrointestinal:  Negative for abdominal pain, constipation and diarrhea.  Neurological:  Negative for dizziness and light-headedness.    There were no vitals taken for this visit.There is no height or weight on file to calculate BMI.-virtual  General Appearance: Casual and Fairly Groomed   Eye Contact:  Good   Speech:  Clear and Coherent and Normal Rate   Volume:  Normal  Mood:  Anxious and Depressed  Affect:  Appropriate, Congruent, and Full Range  Thought Process:  Coherent, Goal Directed, and Linear  Orientation:  Full (Time, Place, and Person)  Thought Content: Illogical and Rumination   Suicidal Thoughts:  No  Homicidal Thoughts:  No  Memory:  Immediate;   Good  Judgement:  Poor  Insight:  Shallow  Psychomotor Activity:  Normal  Concentration:  Concentration: Good and Attention Span: Good  Recall:  Good  Fund of Knowledge: Good  Language: Good  Akathisia:  No  Handed:  Right  AIMS (if indicated): not done, virtual  Assets:   Communication Skills Desire for Improvement Financial Resources/Insurance Housing Leisure Time Physical Health Resilience Social Support Talents/Skills  ADL's:  Intact  Cognition: WNL  Sleep:  Fair   Screenings: AIMS    Flowsheet Row [facility]  AIMS Total Score 0       GAD-7    Flowsheet Row Counselor from 04/29/2023 in BEHAVIORAL HEALTH PARTIAL HOSPITALIZATION PROGRAM  Total GAD-7 Score 19      PHQ2-9    Flowsheet Row Counselor from 05/25/2023 in BEHAVIORAL HEALTH INTENSIVE PSYCH Counselor from 04/29/2023 in BEHAVIORAL HEALTH PARTIAL HOSPITALIZATION PROGRAM Counselor from 04/26/2023 in BEHAVIORAL HEALTH PARTIAL HOSPITALIZATION PROGRAM Office Visit from [facility]/2023 in [facility] Office Visit from 01/14/2022 in [facility]  PHQ-2 Total Score 4 3 4 3 2   PHQ-9 Total Score 18 16 19 5 6       Flowsheet Row ED from 06/08/2023 in [facility] Counselor from 05/25/2023 in BEHAVIORAL HEALTH INTENSIVE PSYCH Counselor from 04/29/2023 in BEHAVIORAL HEALTH PARTIAL HOSPITALIZATION PROGRAM  C-SSRS RISK CATEGORY High Risk Error: Question 6 not populated No Risk       Assessment and Plan:   Borderline Personality d/o  MDD Continued home lamictal 400 mg at bedtime Continued home vraylar 3 mg at bedtime Continued zoloft 25 mg daily  PTSD  Continued home prazosin 1 mg at bedtime SSRI per above  ? Anxiety  Chronic Prescription Benzodiazepine use Vague anxiety dx per chart, unclear if GAD. Recommended tapering xanax off with her outpatient psychiatrist and maximizing first line treatment with SSRI  Continued home xanax 0.5 mg daily PRN SSRI per above  Polypharmacy  Medication Non-adherence Patient with clear instructions to discontinue stimulant and has been recommended to use benzo as needed only. However, patient continues to use adderall, which is most likely cause of her nightmares and poor sleep given timeline of onset of sxs and her re-starting adderall again. Patient continues to see outpatient psychiatrist while at PHP/IOP, despite instructions that patient is to hold off on seeing outpatient psychiatrist until she completes PHP/IOP to avoid  polypharmacy. Patient was instructed again to stop previously discontinued adderall Xanax per above  Rx at [facility]: home lamictal 400 mg at bedtime, home vraylar 3 mg at bedtime, home prazosin 2 mg at bedtime, home xanax 0.5 mg daily  PRN  Med changes made at PHP/IOP: - started zoloft - decreased home prazosin from 2 mg to 1 mg at bedtime - reinforced that patient discontinue adderall (d/c during inpatient, but during PHP and IOP, patient would receive prescription from Dr. Evelene Croon, which we would again recommend d/c)  Collaboration of Care: Patient is to continue therapy and follow-up with their outpatient provider  Patient/Guardian was advised Release of Information must be obtained prior to any record release in order to collaborate their care with an outside provider. Patient/Guardian was advised if they have not already done so to contact the registration department to sign all necessary forms in order for [place] to release information regarding their care.   Consent: Patient/Guardian gives verbal consent for treatment and assignment of benefits for services provided during this visit. Patient/Guardian expressed understanding and agreed to proceed.    [name], DO Psych Resident, PGY-3 06/13/2023, 7:54 PM

## 2023-06-13 NOTE — Telephone Encounter (Signed)
D:  Pt had sent MH-IOP several emails starting at 8:35 a.m. this morning.  1st:  Pt states she was looking at her McChart visit summaries and felt that she was being judged by some things she had previously mentioned to the psychiatrist while in the groups.  "I had told the doctor that I would make the changes she was suggesting me to do, but it seems like I am being judged now."  2nd:  "Verge of a panic attack.Marland KitchenMarland KitchenNeed some time" 3rd:  "Can I just listen today...not good today"  4th:  "Can I be excused at break."  A:  Case mgr called pt.  Provided pt with support.  According to pt, she had a panic attack and it wiped her out.  Reiterated structure with pt for this afternoon.  Pt states she would take a nap and then work on her lego map of the world.  Denies SI/HI or A/V hallucinations.  Inform treatment team.  R:  Pt receptive and said she will return to MH-IOP (virtual ) tomorrow.

## 2023-06-13 NOTE — Progress Notes (Signed)
Virtual Visit via Video Note   I connected with [name] on 06/13/23 at  9:00 AM EDT by a video enabled telemedicine application and verified that I am speaking with the correct person using two identifiers.   At orientation to the IOP program, Case Manager discussed the limitations of evaluation and management by telemedicine and the availability of in person appointments. The patient expressed understanding and agreed to proceed with virtual visits throughout the duration of the program.   Location:  Patient: Patient Home Provider: [facility] Office   History of Present Illness: Bipolar Disorder and Borderline Personality Disorder  Observations/Objective: Check In: Case Manager checked in with all participants to review discharge dates, insurance authorizations, work-related documents and needs from the treatment team regarding medications. Manjot stated needs and engaged in discussion.    Initial Therapeutic Activity: Counselor facilitated a check-in with Aylee to assess for safety, sobriety and medication compliance.  Counselor also inquired about Talajah's current emotional ratings, as well as any significant changes in thoughts, feelings or behavior since previous check in.  [place] presented for session on time and was alert, oriented x5, with no evidence or self-report of active SI/HI or A/V H.  Erionna reported compliance with medication and denied use of alcohol or illicit substances.  Latana reported scores of 6/10 for depression, 7/10 for anxiety, and 0/10 for anger/irritability.  Shermaine denied any recent outbursts or panic attacks.  Lashea reported that a recent success was attending a baseball game and seeing her family for dinner.  She reported that an additional success was making a new friend at this game.  Britanee denied any new struggles.  Aicha reported that her goal today is to run some errands after group.       Second Therapeutic Activity: Counselor covered topic of core beliefs with  group today.  Counselor virtually shared a handout on the subject, which explained how everyone looks at the world differently, and two people can have the same experience, but have different interpretations of what happened.  Members were encouraged to think of these like sunglasses with different "shades" influencing perception towards positive or negative outcomes.  Examples of negative core beliefs were provided, such as "I'm unlovable", "I'm not good enough", and "I'm a bad person".  Members were asked to share which one(s) they could relate to, and then identify evidence which contradicts these beliefs.  Counselor also provided psychoeducation on positive affirmations today.  Counselor explained how these are positive statements which can be spoken out loud or recited mentally to challenge negative thoughts and/or core beliefs to improve mood and outlook each day.  Counselor shared a comprehensive list of affirmations virtually to members with different categories, including ones for health, confidence, success, and happiness.  Counselor invited members to look through this list and identify any which resonated with them, and practice saying them out loud with sincerity.  Intervention effectiveness could not be measured, as Karri stopped responding to counselor upon returning from separate meeting with Dr. Cyndie Chime and [place].  Keyaira eventually disconnected from virtual session at 10:30am, and counselor informed care team of early departure and asked case manager to outreach her.  Please see client chart for outreach documentation.       Third Therapeutic Activity: Counselor offered to teach group members an ACT relaxation technique today to aid in managing difficult thoughts, feelings, urges, and sensations.  Counselor guided members through process of getting comfortable, achieving relaxing breathing rhythm, and then maintaining this throughout activity.  Counselor invited members to imagine a gently flowing  stream in their mind with leaves floating upon it, and when any thoughts, feelings, urges, or sensations arose, good or bad, they were instructed to visualize placing them on these passing leaves over course of 15 minutes practice.  Intervention effectiveness could not be measured due to [facility] leaving group early.    Assessment and Plan: Counselor recommends that [facility] remain in IOP treatment to better manage mental health symptoms, ensure stability and pursue completion of treatment plan goals. Counselor recommends adherence to crisis/safety plan, taking medications as prescribed, and following up with medical professionals if any issues arise.    Follow Up Instructions: Counselor will send Webex link for session tomorrow.  Heavenleigh was advised to call back or seek an in-person evaluation if the symptoms worsen or if the condition fails to improve as anticipated.   Collaboration of Care:   Medication Management AEB Dr. Cyndie Chime or Hillery Jacks, NP                                          Case Manager AEB [name], CNA   Patient/Guardian was advised Release of Information must be obtained prior to any record release in order to collaborate their care with an outside provider. Patient/Guardian was advised if they have not already done so to contact the registration department to sign all necessary forms in order for [place] to release information regarding their care.   Consent: Patient/Guardian gives verbal consent for treatment and assignment of benefits for services provided during this visit. Patient/Guardian expressed understanding and agreed to proceed.  I provided 90 minutes of non-face-to-face time during this encounter.   [name], LCSW, LCAS 06/13/23

## 2023-06-14 ENCOUNTER — Other Ambulatory Visit (HOSPITAL_COMMUNITY): Payer: BC Managed Care – PPO | Admitting: Licensed Clinical Social Worker

## 2023-06-14 DIAGNOSIS — F4312 Post-traumatic stress disorder, chronic: Secondary | ICD-10-CM | POA: Diagnosis not present

## 2023-06-14 DIAGNOSIS — F603 Borderline personality disorder: Secondary | ICD-10-CM | POA: Diagnosis not present

## 2023-06-14 DIAGNOSIS — Z9151 Personal history of suicidal behavior: Secondary | ICD-10-CM | POA: Diagnosis not present

## 2023-06-14 DIAGNOSIS — F411 Generalized anxiety disorder: Secondary | ICD-10-CM | POA: Diagnosis not present

## 2023-06-14 DIAGNOSIS — F314 Bipolar disorder, current episode depressed, severe, without psychotic features: Secondary | ICD-10-CM

## 2023-06-14 NOTE — Progress Notes (Signed)
Virtual Visit via Video Note   I connected with Shelby Gallegos on 06/14/23 at  9:00 AM EDT by a video enabled telemedicine application and verified that I am speaking with the correct person using two identifiers.   At orientation to the IOP program, Case Manager discussed the limitations of evaluation and management by telemedicine and the availability of in person appointments. The patient expressed understanding and agreed to proceed with virtual visits throughout the duration of the program.   Location:  Patient: Patient Home Provider: Clinical Home Office   History of Present Illness: Bipolar Disorder and Borderline Personality Disorder  Observations/Objective: Check In: Case Manager checked in with all participants to review discharge dates, insurance authorizations, work-related documents and needs from the treatment team regarding medications. Shelby Gallegos stated needs and engaged in discussion.    Initial Therapeutic Activity: Counselor facilitated a check-in with Shelby Gallegos to assess for safety, sobriety and medication compliance.  Counselor also inquired about Pascha's current emotional ratings, as well as any significant changes in thoughts, feelings or behavior since previous check in.  Shelby Gallegos presented for session on time and was alert, oriented x5, with no evidence or self-report of active SI/HI or A/V H.  Shelby Gallegos reported compliance with medication and denied use of alcohol or illicit substances.  Shelby Gallegos reported scores of 4/10 for depression, 5/10 for anxiety, and 0/10 for anger/irritability.  Shelby Gallegos denied any recent outbursts.  Shelby Gallegos reported that a recent success was working on a Photographer yesterday for self-care.  Shelby Gallegos reported that a recent struggle was experiencing a panic attack yesterday during group, which led her to leave early.  She stated "I think yesterday was just too stressful for me and I couldn't focus".  Shelby Gallegos reported that her goal today is to go grocery shopping, since she didn't feel  capable yesterday.       Second Therapeutic Activity: Counselor introduced topic of self-care today.  Counselor explained how this can be defined as the things one does to maintain good health and improve well-being.  Counselor provided members with a self-care assessment form to complete.  This handout featured various sub-categories of self-care, including physical, psychological/emotional, social, spiritual, and professional.  Members were asked to rank their engagement in the activities listed for each dimension on a scale of 1-3, with 1 indicating 'Poor', 2 indicating 'Ok', and 3 indicating 'Well'.  Counselor invited members to share results of their assessment, and inquired about which areas of self-care they are doing well in, as well as areas that require attention, and how they plan to begin addressing this during treatment.  Intervention was effective, as evidenced by Shelby Gallegos successfully completing initial 2 sections of assessment and actively engaging in discussion on subject, reporting that she is excelling in areas such as attending preventative medical appointments, eating regularly, learning new things unrelated to work or school, finding reasons to laugh, and participating in hobbies, but would benefit from focusing more on areas such as eating healthy foods, exercising regularly, participating in fun activities, getting enough sleep, recognizing strengths and accomplishments, getting away from distractions, and talking about problems.  Shelby Gallegos reported that she would work to improve self-care deficits by attending bible study regularly, going to local sports games for entertainment, taking a warm bath a bedtime or meditating to help her fall asleep easier, cutting back on sugary or fatty foods in diet, taking walks around the neighborhood each week to get more exercise, considering joining a bowling league, highlighting daily accomplishments to give herself credit for  in a journal, setting healthier  social media boundaries to avoid using her phone too much, and prioritizing therapy in order to ensure she continue talking honestly about problems with supportive professionals or peers.    Assessment and Plan: Counselor recommends that [facility] remain in IOP treatment to better manage mental health symptoms, ensure stability and pursue completion of treatment plan goals. Counselor recommends adherence to crisis/safety plan, taking medications as prescribed, and following up with medical professionals if any issues arise.    Follow Up Instructions: Counselor will send Webex link for session tomorrow.  Shelby Gallegos was advised to call back or seek an in-person evaluation if the symptoms worsen or if the condition fails to improve as anticipated.   Collaboration of Care:   Medication Management AEB Shelby Gallegos or Shelby Jacks, Shelby Gallegos                                          Case Manager AEB Shelby Modena, Shelby Gallegos   Patient/Guardian was advised Release of Information must be obtained prior to any record release in order to collaborate their care with an outside provider. Patient/Guardian was advised if they have not already done so to contact the registration department to sign all necessary forms in order for [place] to release information regarding their care.   Consent: Patient/Guardian gives verbal consent for treatment and assignment of benefits for services provided during this visit. Patient/Guardian expressed understanding and agreed to proceed.  I provided 180 minutes of non-face-to-face time during this encounter.   Shelby Stain, LCSW, LCAS 06/14/23

## 2023-06-15 ENCOUNTER — Other Ambulatory Visit (HOSPITAL_COMMUNITY): Payer: BC Managed Care – PPO | Admitting: Licensed Clinical Social Worker

## 2023-06-15 DIAGNOSIS — Z9151 Personal history of suicidal behavior: Secondary | ICD-10-CM

## 2023-06-15 DIAGNOSIS — Z720 Tobacco use: Secondary | ICD-10-CM

## 2023-06-15 DIAGNOSIS — Z91148 Patient's other noncompliance with medication regimen for other reason: Secondary | ICD-10-CM

## 2023-06-15 DIAGNOSIS — F515 Nightmare disorder: Secondary | ICD-10-CM

## 2023-06-15 DIAGNOSIS — F411 Generalized anxiety disorder: Secondary | ICD-10-CM

## 2023-06-15 DIAGNOSIS — F603 Borderline personality disorder: Secondary | ICD-10-CM | POA: Diagnosis not present

## 2023-06-15 DIAGNOSIS — F331 Major depressive disorder, recurrent, moderate: Secondary | ICD-10-CM | POA: Insufficient documentation

## 2023-06-15 DIAGNOSIS — F4312 Post-traumatic stress disorder, chronic: Secondary | ICD-10-CM | POA: Diagnosis not present

## 2023-06-15 DIAGNOSIS — Z79899 Other long term (current) drug therapy: Secondary | ICD-10-CM

## 2023-06-15 DIAGNOSIS — F321 Major depressive disorder, single episode, moderate: Secondary | ICD-10-CM | POA: Insufficient documentation

## 2023-06-15 NOTE — Progress Notes (Signed)
Virtual Visit via Video Note  I connected with Shelby Gallegos on @TODAY @ at  9:00 AM EDT by a video enabled telemedicine application and verified that I am speaking with the correct person using two identifiers.  Location: Patient: at home Provider: at office   I discussed the limitations of evaluation and management by telemedicine and the availability of in person appointments. The patient expressed understanding and agreed to proceed.  I discussed the assessment and treatment plan with the patient. The patient was provided an opportunity to ask questions and all were answered. The patient agreed with the plan and demonstrated an understanding of the instructions.   The patient was advised to call back or seek an in-person evaluation if the symptoms worsen or if the condition fails to improve as anticipated.  I provided 30 minutes of non-face-to-face time during this encounter.   Shelby Gallegos, M.Ed,CNA   Patient ID: Shelby Gallegos, female   DOB: 08-Sep-1981, 42 y.o.   MRN: [id number] D:  As per previous CCA states:  "Shelby Gallegos is a 42yo female referred to [facility] following a [facility] discharge for a non lethal suicide attempt. She cites her stressors as the process of evicting her roommate, quitting her job yesterday, and readjusting after being d/c from [facility]. She reports slightly decreased ADLs. She reports the following psych meds: lamictal 400mg , vraylar 3mg , prazosin (unsure of dosage), adderall 20mg  3x, Xanax 0.5 PRN. 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 Prior tx history includes PHP in 2020, IOP in 2018, outpatient therapy and med man, and previous day classes at [facility] before it closed. She endorses four psychiatric hospitalizations during the following years; 1998, 2018, 2020, and 2024. She reports two total suicide attempts and states she does not remember the first and that it is suspected she had a psychotic  episode during that time. She states she is diagnosed with bipolar 2, ADHD, anxiety, and PTSD. She endorses current SI without plan or intent and contracts for safety at the end of the assessment. She denies HI, AVH, NSSI, and states there are no weapons in her home. She states her uncle had bipolar disorder. She cites her best friend, church friends, and family as her supports. She will be living alone after her roommate is evicted. She states she drank two bottles of Mike's hard lemonade the night before last "to calm down." She denies previous alcohol abuse but endorses previous regular use and states she has not been drinking at all over the past several years. She reports hx of migraines and possible thyroid problems."   Patient stepped down to virtual MH-IOP from virtual PHP today (05-25-23). C/O of increased anxiety. "Shelby Gallegos told me not to take my ADHD medication but I am really struggling to stay focused." Reports having some dark thoughts and nightmares, but denies any SI/HI or A/V hallucinations. On a scale of 1-10 (10 being the worst); pt rates her depression at a 3/4 and her anxiety at a 10. Scored 18 on PHQ-9. Pt was so anxious that she requested to take a five minute break, while in the group today. Pt had sent the case manager an email stating that she was having a difficult time. "I am fighting a near panic attack...bear with me..I'm listening to the group though."    Pt attended virtual MH-IOP for 13 days.  "I feel like I have improved.  I've had some challenging days but for  the most part I'm feeling better."  Pt states she continues to struggle with anxiety.  Pt reports she is looking forward to f/u with Shelby Gallegos group.  States there is a 1-2 month wait before the next group starts.  Pt showed the case mgr a DBT skills book she has purchased.  On a scale of 1-10 (10 being the worst); pt rates her depression at a 4 and anxiety at a 5.  Denies SI/HI, paranoia, or A/V  hallucinations. A:  D/C pt today.  Pt mentioned she went ahead and seen Dr. Evelene Gallegos yesterday d/t already being scheduled.  States she will call for another appt.  Reiterated to pt that her insurance may not pay for her seeing Dr. Evelene Gallegos and attending group yesterday; so she may be billed for one of them.  Pt stated she understood and didn't care.  F/U with Shelby Gallegos (therapist) tomorrow. Pt was advised of ROI must be obtained prior to any records release in order to collaborate her care with an outside provider.  Pt was advised if she has not already done so to contact the front desk to sign all necessary forms in order for MH-IOP to release info re: her care.  Consent:  Pt gives verbal consent for tx and assignment of benefits for services provided during this telehealth group process.  Pt expressed understanding and agreed to proceed. Collaboration of care:  Collaborate with Drs. Concha Gallegos, Shelby Gallegos AEB, Shelby Gallegos AEB and Shelby Gallegos AEB , and her individual therapist Shelby Gallegos @ Shelby Gallegos) AEB.  R:  Pt receptive.  Shelby Gallegos, M.Ed,CNA

## 2023-06-15 NOTE — Progress Notes (Signed)
Virtual Visit via Video Note   I connected with [name] on 06/15/23 at  9:00 AM EDT by a video enabled telemedicine application and verified that I am speaking with the correct person using two identifiers.   At orientation to the IOP program, Case Manager discussed the limitations of evaluation and management by telemedicine and the availability of in person appointments. The patient expressed understanding and agreed to proceed with virtual visits throughout the duration of the program.   Location:  Patient: Patient Home Provider: [facility] Office   History of Present Illness: Bipolar Disorder and Borderline Personality Disorder  Observations/Objective: Check In: Case Manager checked in with all participants to review discharge dates, insurance authorizations, work-related documents and needs from the treatment team regarding medications. Susen stated needs and engaged in discussion.    Initial Therapeutic Activity: Counselor facilitated a check-in with Marygrace to assess for safety, sobriety and medication compliance.  Counselor also inquired about Gwenn's current emotional ratings, as well as any significant changes in thoughts, feelings or behavior since previous check in.  [place] presented for session on time and was alert, oriented x5, with no evidence or self-report of active SI/HI or A/V H.  Alexius reported compliance with medication and denied use of alcohol or illicit substances.  Skilar reported scores of 4/10 for depression, 4/10 for anxiety, and 0/10 for anger/irritability.  Marieli denied any recent outbursts or panic attacks.  Carleigh reported that a recent success was working on her Photographer yesterday.  Juhi reported that a recent struggle was experiencing a strong headache yesterday, which led her to go to sleep early.  Alyda reported that her goal today is to attend bible study.       Second Therapeutic Activity: Counselor introduced Peggye Fothergill, [employer] Pharmacist, to provide  psychoeducation on topic of medication compliance with members today.  Michelle Nasuti provided psychoeducation on classes of medications such as antidepressants, antipsychotics, what symptoms they are intended to treat, and any side effects one might encounter while on a particular prescription.  Time was allowed for clients to ask any questions they might have of [facility] regarding this specialty.  Intervention effectiveness could not be measured, as client did not participate.    Third Therapeutic Activity: Counselor introduced Celine Mans, [employer], to provide psychoeducation on topic of nutrition with members today.  Jae Dire virtually shared a comprehensive PowerPoint presentation to guide discussion, which featured the various components of healthy living that influence one's wellbeing, including practicing mindfulness, staying physically active, calm in mood, well-rested, and more.  Jae Dire explained how good nutrition reinforces positive physical and mental health, and shared a video which explained how food intake affects brain functioning in particular.  Jae Dire provided advice on how to adjust diet in order to promote wellbeing during course of treatment, including achieving balanced daily intake along with regular exercise.  Jae Dire offered the 'plate method' as a tool for proper distribution of protein, grains, starches, vegetables, fruit, and low calorie drink, in addition to concept of 'mindful eating', and covering current Dietary Guidelines for Americans for more tips.  Jae Dire inquired about changes members would like to make to their nutrition in order to increase overall wellbeing based upon information shared today, and time was allowed to ask any questions they might have of Jae Dire regarding her specialty.  Intervention was effective, as evidenced by [facility] participating in discussion with speaker on the subject, reporting that some of her wellness goals are to become more active, eat regularly, and prioritize  healthy food choices such as fruits and veggies in her diet.  Haniyah also participated in a stretching activity that could be added to her routine to increase physical activity.   Assessment and Plan: Elouise has completed MHIOP and will be discharged today.  Counselor recommends adherence to crisis/safety plan, taking medications as prescribed, and following up with medical professionals if any issues arise.    Follow Up Instructions: Alexx was advised to call back or seek an in-person evaluation if the symptoms worsen or if the condition fails to improve as anticipated.   Collaboration of Care:   Medication Management AEB Dr. Cyndie Chime or Hillery Jacks, NP                                          Case Manager AEB [name], CNA   Patient/Guardian was advised Release of Information must be obtained prior to any record release in order to collaborate their care with an outside provider. Patient/Guardian was advised if they have not already done so to contact the registration department to sign all necessary forms in order for [place] to release information regarding their care.   Consent: Patient/Guardian gives verbal consent for treatment and assignment of benefits for services provided during this visit. Patient/Guardian expressed understanding and agreed to proceed.  I provided 180 minutes of non-face-to-face time during this encounter.   [name], LCSW, LCAS 06/15/23

## 2023-06-15 NOTE — Patient Instructions (Signed)
D:  Patient completed virtual MH-IOP today.  A:  Discharge today.  Follow up with Dr. Evelene Croon (patient will call for an appt) and [name] (therapist) tomorrow 06-16-23.  Strongly recommend attending [facility] DBT group and [facility] [employer] [facility] [employer] support groups.  R:  Patient receptive.

## 2023-06-15 NOTE — Progress Notes (Addendum)
Virtual Visit via Video Note  I connected with Shelby Gallegos on 06/15/23 at  9:00 AM EDT by a video enabled telemedicine application and verified that I am speaking with the correct person using two identifiers.  Location: Patient: Home  Provider: Office   I discussed the limitations of evaluation and management by telemedicine and the availability of in person appointments. The patient expressed understanding and agreed to proceed.   I discussed the assessment and treatment plan with the patient. The patient was provided an opportunity to ask questions and all were answered. The patient agreed with the plan and demonstrated an understanding of the instructions.   The patient was advised to call back or seek an in-person evaluation if the symptoms worsen or if the condition fails to improve as anticipated.   [name], DO  Psych Resident, PGY-3  [facility] Partial Hospitalization Program Psych Discharge Summary  [name] [id number]  Admission date: 05/26/2023 Discharge date: 06/15/2023  Reason for admission: Shelby Gallegos is a 42 y.o. female with PMH of borderline personality disorder, GAD, PTSD, nicotine use d/o, chronic benzodiazepine use, prescription stimulant misuse, suicide attempts, inpatient psych admission, who was admitted to [facility] (04/29/2023) then stepped down to IOP (05/26/2023)  [facility] 04/16/2023-04/21/2023 prior to PHP.  Progress in Program Toward Treatment Goals: Progressing  Progress (rationale):   Patient stated that she has been doing well since last encounter last week and overall has found that PHP/IOP has been helpful overall. She feels a bit anxious and overall confident and ready to step down to the next level of care and resume outpatient psych follow-up with Dr. Evelene Croon.  Reported that she has learned more tools to cope with bad situations and how to de-escalate myself rather than constably reach out to others, and to move forward when thought that arent't true and has  to do with self harming.  Sleep and appetite have been good after stopping the adderall and limiting xanax. Denied nightmares as well.   She denied active and passive SI, HI, AVH, paranoia.  Last time SI was last week when she went to the ED on 06/08/2023 in the setting of misusing home adderall and xanax.  She contracted to safety, where she would call Aunt, sister, and close friend. She is also aware of 988, 911, and [facility].   She confirmed her current home meds per below.  Vrayla 3 mg daily Lamictal 400 mg qHS Zoloft 25 mg daily Xanax 0.5 mg daily PRN Prazosin 1 mg qHS Neurtec 75 mg PRN B12 1,000 mcg/ml vl Hydroxyzine 10 mg daily PRN aimovig IM monthly Nicotine patch 14 mg daily    Review of Systems  Constitutional:  Negative for malaise/fatigue.  Respiratory:  Negative for shortness of breath.   Cardiovascular:  Negative for chest pain.  Gastrointestinal:  Negative for abdominal pain, constipation, diarrhea, nausea and vomiting.  Neurological:  Negative for dizziness and headaches.    Past Psychiatric Hx: Previous Psych Diagnoses: Bipolar affective disorder, borderline personality disorder, ADHD, anxiety, PTSD, difficulty coping Prior inpatient treatment: X 3 between 1998-2020.  [facility] in March 2020. [facility] 03/2023 Prior outpatient treatment: PHP in March 2020, intensive outpatient in May 2018 Psychotherapy hx: Has a therapist, [name], at [facility] in [place] History of suicide: Just prior to hospitalization in 2020 History of homicide: Denies Psychiatric medication history: In addition to current regimen, has trialed Caplyta, which does not work well Psychiatric medication compliance history: Reports compliance Neuromodulation history: Denies Current Psychiatrist: Dr. Evelene Croon Current therapist: Has  a therapist, [name], at [facility] in [place]   Substance Abuse Hx: Alcohol: Reports prior alcohol use history with sobriety maintained x 3 years.  Relapsed on  5/16. Tobacco: Reports nicotine vape use Illicit drugs: Denies Rx drug abuse: Denies Rehab hx: Denies; has been attending AA meetings for the past 3 years   Past Medical History: Medical Diagnoses: Hypothyroidism, chronic migraines Home Rx: See above Prior Hosp: Denies Head trauma, LOC, concussions, seizures: Denies Allergies: Onion-triggers migraines LMP: 03/15/2023 Contraception: Denies PCP: [name], PA-C   Family History: Medical: See chart Psych: Paternal uncle-bipolar disorder Psych Rx: Unsure SA/HA: Denies Substance use family hx: Paternal family with rampant alcohol use   Social History: Abuse: Denies Marital Status: Single Children: None Employment: Employed by a life Company secretary: Oncologist from [employer]: Currently owns her home, but living with her boss due to ongoing situation with roommate Finances: Employment Legal: Denies Hotel manager: Denies  There were no vitals taken for this visit.  Psychiatric Specialty Exam: General Appearance: Casual, faily groomed  Eye Contact:  Good    Speech:  Clear, coherent, normal rate   Volume:  Normal   Mood:  "good, a little bit anxious"  Affect:  Appropriate, congruent, full range  Thought Content: Logical, rumination  Suicidal Thoughts: Denied active and passive SI    Thought Process:  Coherent, goal-directed, linear   Orientation:  A&Ox4   Memory:  Immediate good  Judgment:  Fair   Insight:  Shallow  Concentration:  Attention and concentration good   Recall:  Good  Fund of Knowledge: Good  Language: Good, fluent  Psychomotor Activity: Normal grossly  Akathisia:  NA   AIMS (if indicated): NA   Assets:  Communication Skills Desire for Improvement Financial Resources/Insurance Housing Leisure Time Physical Health Social Support Talents/Skills  ADL's:  Intact  Cognition: WNL  Sleep:  Good       Discharge Plan:   Of note, on second chart review later in the  afternoon after seeing the patient this AM, saw that patient had seen Dr. Evelene Croon again since last encounter last week, as patient has picked up increased dose of vraylar on 06/14/2023 per med dispense record.   Borderline Personality d/o  MDD  GAD Continued home lamictal 400 mg at bedtime Continued home vraylar 3 mg at bedtime Continued zoloft 25 mg daily Hydroxyzine 10 mg daily PRN Recommended and information for outpatient DBT resources were provided   PTSD  Continued home prazosin 1 mg at bedtime SSRI per above   ? Anxiety  Chronic Prescription Benzodiazepine use Vague anxiety dx per chart, unclear if GAD. Recommended tapering xanax off with her outpatient psychiatrist and maximizing first line treatment with SSRI  Continued home xanax 0.5 mg daily PRN SSRI per above   Nicotine use d/o NRT Encouraged cessation  Polypharmacy  Medication Non-adherence Patient with clear instructions to discontinue stimulant and has been recommended to use benzo as needed only. However, patient continues to use adderall, which is most likely cause of her nightmares and poor sleep given timeline of onset of sxs and her re-starting adderall again. Patient continues to see outpatient psychiatrist multiple times while at PHP/IOP, despite instructions multiple times that patient is to hold off on seeing outpatient psychiatrist until she completes PHP/IOP to avoid polypharmacy. Patient was instructed again to stop previously discontinued adderall Xanax per above   B12 supplementation Managed by PCP B12 1,000 mcg/ml vl  Hypothyroidism Managed by PCP Synthroid 75 mcg daily  H/o  Migraine Managed by outpatient neurologist - Windell Norfolk, MD Neurtec 75 mg PRN Aimovig IM monthly  Rx at [facility]: home lamictal 400 mg at bedtime, home vraylar 3 mg at bedtime, home prazosin 2 mg at bedtime, home xanax 0.5 mg daily PRN   Med changes made at PHP/IOP: - started zoloft - decreased home prazosin from 2 mg to  1 mg at bedtime - reinforced that patient discontinue adderall (d/c during inpatient, but during PHP and IOP, patient would receive prescription from Dr. Evelene Croon, which we would again recommend d/c)  Medications on PHP/IOP DC: Vrayla 3 mg daily Lamictal 400 mg qHS Zoloft 25 mg daily Xanax 0.5 mg daily PRN Prazosin 1 mg qHS Neurtec 75 mg PRN B12 1,000 mcg/ml vl Hydroxyzine 10 mg daily PRN aimovig IM monthly Nicotine patch 14 mg daily Synthroid 75 mcg daily  Resume care with outpatient psychiatrist Dr. Evelene Croon - appointment in 2 weeks Resume outpatient counseling tomorrow, 06/16/2023  Patient/Guardian was advised Release of Information must be obtained prior to any record release in order to collaborate their care with an outside provider. Patient/Guardian was advised if they have not already done so to contact the registration department to sign all necessary forms in order for [place] to release information regarding their care.   Consent: Patient/Guardian gives verbal consent for treatment and assignment of benefits for services provided during this visit. Patient/Guardian expressed understanding and agreed to proceed.   [name], DO Psych Resident, PGY-3 06/15/2023

## 2023-06-16 ENCOUNTER — Other Ambulatory Visit (HOSPITAL_COMMUNITY): Payer: BC Managed Care – PPO

## 2023-06-16 DIAGNOSIS — F4312 Post-traumatic stress disorder, chronic: Secondary | ICD-10-CM | POA: Diagnosis not present

## 2023-06-17 ENCOUNTER — Other Ambulatory Visit (HOSPITAL_COMMUNITY): Payer: BC Managed Care – PPO

## 2023-06-20 ENCOUNTER — Other Ambulatory Visit (HOSPITAL_COMMUNITY): Payer: BC Managed Care – PPO

## 2023-06-21 ENCOUNTER — Other Ambulatory Visit (HOSPITAL_COMMUNITY): Payer: BC Managed Care – PPO

## 2023-06-21 DIAGNOSIS — F4312 Post-traumatic stress disorder, chronic: Secondary | ICD-10-CM | POA: Diagnosis not present

## 2023-06-22 ENCOUNTER — Other Ambulatory Visit (HOSPITAL_COMMUNITY): Payer: BC Managed Care – PPO

## 2023-06-23 ENCOUNTER — Other Ambulatory Visit (HOSPITAL_COMMUNITY): Payer: BC Managed Care – PPO

## 2023-06-24 ENCOUNTER — Other Ambulatory Visit (HOSPITAL_COMMUNITY): Payer: BC Managed Care – PPO

## 2023-06-27 ENCOUNTER — Other Ambulatory Visit (HOSPITAL_COMMUNITY): Payer: Self-pay

## 2023-06-27 ENCOUNTER — Telehealth: Payer: Self-pay

## 2023-06-27 NOTE — Telephone Encounter (Signed)
   Received a renewal request-PT has not been evaluated since starting Ubrelvy-Please advise.

## 2023-06-27 NOTE — Telephone Encounter (Signed)
Pharmacy Patient Advocate Encounter   Received notification from Fax that prior authorization for Ubrelvy 100MG  tablets is required/requested.   Insurance verification completed.   The patient is insured through [facility] .   Per test claim: PA required; PA started via CoverMyMeds. KEY [id number] . Waiting for clinical questions to populate.

## 2023-06-28 DIAGNOSIS — F4312 Post-traumatic stress disorder, chronic: Secondary | ICD-10-CM | POA: Diagnosis not present

## 2023-07-04 ENCOUNTER — Ambulatory Visit: Payer: BC Managed Care – PPO | Admitting: Neurology

## 2023-07-05 DIAGNOSIS — F4312 Post-traumatic stress disorder, chronic: Secondary | ICD-10-CM | POA: Diagnosis not present

## 2023-07-05 NOTE — Telephone Encounter (Signed)
1st attempt lvm 1st attempt

## 2023-07-05 NOTE — Telephone Encounter (Signed)
Called and spoke to pts father and told him that we need her to call us asap and he took down gna number and stated that he would let her know

## 2023-07-05 NOTE — Telephone Encounter (Signed)
Following up- Clinical questions expire on Aug 8th.

## 2023-07-12 DIAGNOSIS — F4312 Post-traumatic stress disorder, chronic: Secondary | ICD-10-CM | POA: Diagnosis not present

## 2023-07-19 DIAGNOSIS — F4312 Post-traumatic stress disorder, chronic: Secondary | ICD-10-CM | POA: Diagnosis not present

## 2023-07-26 DIAGNOSIS — F4312 Post-traumatic stress disorder, chronic: Secondary | ICD-10-CM | POA: Diagnosis not present

## 2023-08-02 DIAGNOSIS — F4312 Post-traumatic stress disorder, chronic: Secondary | ICD-10-CM | POA: Diagnosis not present

## 2023-08-09 DIAGNOSIS — F4312 Post-traumatic stress disorder, chronic: Secondary | ICD-10-CM | POA: Diagnosis not present

## 2023-08-16 DIAGNOSIS — F4312 Post-traumatic stress disorder, chronic: Secondary | ICD-10-CM | POA: Diagnosis not present

## 2023-08-18 ENCOUNTER — Ambulatory Visit: Payer: BC Managed Care – PPO | Admitting: Neurology

## 2023-08-23 DIAGNOSIS — F4312 Post-traumatic stress disorder, chronic: Secondary | ICD-10-CM | POA: Diagnosis not present

## 2023-08-29 DIAGNOSIS — F3131 Bipolar disorder, current episode depressed, mild: Secondary | ICD-10-CM | POA: Diagnosis not present

## 2023-08-29 DIAGNOSIS — F4310 Post-traumatic stress disorder, unspecified: Secondary | ICD-10-CM | POA: Diagnosis not present

## 2023-08-29 DIAGNOSIS — F3174 Bipolar disorder, in full remission, most recent episode manic: Secondary | ICD-10-CM | POA: Diagnosis not present

## 2023-08-29 DIAGNOSIS — F900 Attention-deficit hyperactivity disorder, predominantly inattentive type: Secondary | ICD-10-CM | POA: Diagnosis not present

## 2023-08-30 DIAGNOSIS — F4312 Post-traumatic stress disorder, chronic: Secondary | ICD-10-CM | POA: Diagnosis not present

## 2023-09-06 DIAGNOSIS — F4312 Post-traumatic stress disorder, chronic: Secondary | ICD-10-CM | POA: Diagnosis not present

## 2023-09-13 DIAGNOSIS — F4312 Post-traumatic stress disorder, chronic: Secondary | ICD-10-CM | POA: Diagnosis not present

## 2023-09-20 DIAGNOSIS — F4312 Post-traumatic stress disorder, chronic: Secondary | ICD-10-CM | POA: Diagnosis not present

## 2023-09-27 DIAGNOSIS — F4312 Post-traumatic stress disorder, chronic: Secondary | ICD-10-CM | POA: Diagnosis not present

## 2023-10-04 DIAGNOSIS — F4312 Post-traumatic stress disorder, chronic: Secondary | ICD-10-CM | POA: Diagnosis not present

## 2023-10-10 DIAGNOSIS — F4312 Post-traumatic stress disorder, chronic: Secondary | ICD-10-CM | POA: Diagnosis not present

## 2023-10-18 DIAGNOSIS — F4312 Post-traumatic stress disorder, chronic: Secondary | ICD-10-CM | POA: Diagnosis not present

## 2023-10-25 DIAGNOSIS — F4312 Post-traumatic stress disorder, chronic: Secondary | ICD-10-CM | POA: Diagnosis not present

## 2023-11-01 DIAGNOSIS — F4312 Post-traumatic stress disorder, chronic: Secondary | ICD-10-CM | POA: Diagnosis not present

## 2023-11-08 DIAGNOSIS — F4312 Post-traumatic stress disorder, chronic: Secondary | ICD-10-CM | POA: Diagnosis not present

## 2023-11-16 DIAGNOSIS — F4312 Post-traumatic stress disorder, chronic: Secondary | ICD-10-CM | POA: Diagnosis not present

## 2023-11-29 DIAGNOSIS — F4312 Post-traumatic stress disorder, chronic: Secondary | ICD-10-CM | POA: Diagnosis not present

## 2023-12-07 DIAGNOSIS — F4312 Post-traumatic stress disorder, chronic: Secondary | ICD-10-CM | POA: Diagnosis not present

## 2023-12-13 DIAGNOSIS — F4312 Post-traumatic stress disorder, chronic: Secondary | ICD-10-CM | POA: Diagnosis not present

## 2023-12-20 DIAGNOSIS — F4312 Post-traumatic stress disorder, chronic: Secondary | ICD-10-CM | POA: Diagnosis not present

## 2023-12-28 DIAGNOSIS — F4310 Post-traumatic stress disorder, unspecified: Secondary | ICD-10-CM | POA: Diagnosis not present

## 2023-12-28 DIAGNOSIS — F900 Attention-deficit hyperactivity disorder, predominantly inattentive type: Secondary | ICD-10-CM | POA: Diagnosis not present

## 2023-12-28 DIAGNOSIS — F3174 Bipolar disorder, in full remission, most recent episode manic: Secondary | ICD-10-CM | POA: Diagnosis not present

## 2023-12-28 DIAGNOSIS — F3176 Bipolar disorder, in full remission, most recent episode depressed: Secondary | ICD-10-CM | POA: Diagnosis not present

## 2024-01-03 DIAGNOSIS — F4312 Post-traumatic stress disorder, chronic: Secondary | ICD-10-CM | POA: Diagnosis not present

## 2024-01-11 DIAGNOSIS — F4312 Post-traumatic stress disorder, chronic: Secondary | ICD-10-CM | POA: Diagnosis not present

## 2024-01-17 DIAGNOSIS — F4312 Post-traumatic stress disorder, chronic: Secondary | ICD-10-CM | POA: Diagnosis not present

## 2024-01-24 DIAGNOSIS — F4312 Post-traumatic stress disorder, chronic: Secondary | ICD-10-CM | POA: Diagnosis not present

## 2024-02-07 DIAGNOSIS — F4312 Post-traumatic stress disorder, chronic: Secondary | ICD-10-CM | POA: Diagnosis not present

## 2024-02-14 DIAGNOSIS — F4312 Post-traumatic stress disorder, chronic: Secondary | ICD-10-CM | POA: Diagnosis not present

## 2024-02-15 ENCOUNTER — Ambulatory Visit: Payer: BC Managed Care – PPO | Admitting: Neurology

## 2024-02-28 DIAGNOSIS — F4312 Post-traumatic stress disorder, chronic: Secondary | ICD-10-CM | POA: Diagnosis not present

## 2024-03-01 DIAGNOSIS — F4312 Post-traumatic stress disorder, chronic: Secondary | ICD-10-CM | POA: Diagnosis not present

## 2024-03-06 DIAGNOSIS — F4312 Post-traumatic stress disorder, chronic: Secondary | ICD-10-CM | POA: Diagnosis not present

## 2024-03-13 DIAGNOSIS — F4312 Post-traumatic stress disorder, chronic: Secondary | ICD-10-CM | POA: Diagnosis not present

## 2024-03-19 DIAGNOSIS — F3174 Bipolar disorder, in full remission, most recent episode manic: Secondary | ICD-10-CM | POA: Diagnosis not present

## 2024-03-19 DIAGNOSIS — F3175 Bipolar disorder, in partial remission, most recent episode depressed: Secondary | ICD-10-CM | POA: Diagnosis not present

## 2024-03-19 DIAGNOSIS — F4310 Post-traumatic stress disorder, unspecified: Secondary | ICD-10-CM | POA: Diagnosis not present

## 2024-03-19 DIAGNOSIS — F900 Attention-deficit hyperactivity disorder, predominantly inattentive type: Secondary | ICD-10-CM | POA: Diagnosis not present

## 2024-03-20 DIAGNOSIS — F4312 Post-traumatic stress disorder, chronic: Secondary | ICD-10-CM | POA: Diagnosis not present

## 2024-03-27 DIAGNOSIS — F4312 Post-traumatic stress disorder, chronic: Secondary | ICD-10-CM | POA: Diagnosis not present

## 2024-04-03 DIAGNOSIS — F4312 Post-traumatic stress disorder, chronic: Secondary | ICD-10-CM | POA: Diagnosis not present

## 2024-04-11 DIAGNOSIS — F4312 Post-traumatic stress disorder, chronic: Secondary | ICD-10-CM | POA: Diagnosis not present

## 2024-04-18 DIAGNOSIS — F3163 Bipolar disorder, current episode mixed, severe, without psychotic features: Secondary | ICD-10-CM | POA: Diagnosis not present

## 2024-04-18 DIAGNOSIS — F900 Attention-deficit hyperactivity disorder, predominantly inattentive type: Secondary | ICD-10-CM | POA: Diagnosis not present

## 2024-04-18 DIAGNOSIS — F4312 Post-traumatic stress disorder, chronic: Secondary | ICD-10-CM | POA: Diagnosis not present

## 2024-04-18 DIAGNOSIS — F411 Generalized anxiety disorder: Secondary | ICD-10-CM | POA: Diagnosis not present

## 2024-04-24 DIAGNOSIS — F3163 Bipolar disorder, current episode mixed, severe, without psychotic features: Secondary | ICD-10-CM | POA: Diagnosis not present

## 2024-04-24 DIAGNOSIS — F4312 Post-traumatic stress disorder, chronic: Secondary | ICD-10-CM | POA: Diagnosis not present

## 2024-04-24 DIAGNOSIS — F411 Generalized anxiety disorder: Secondary | ICD-10-CM | POA: Diagnosis not present

## 2024-04-24 DIAGNOSIS — F900 Attention-deficit hyperactivity disorder, predominantly inattentive type: Secondary | ICD-10-CM | POA: Diagnosis not present

## 2024-05-08 DIAGNOSIS — F3163 Bipolar disorder, current episode mixed, severe, without psychotic features: Secondary | ICD-10-CM | POA: Diagnosis not present

## 2024-05-08 DIAGNOSIS — F411 Generalized anxiety disorder: Secondary | ICD-10-CM | POA: Diagnosis not present

## 2024-05-08 DIAGNOSIS — F900 Attention-deficit hyperactivity disorder, predominantly inattentive type: Secondary | ICD-10-CM | POA: Diagnosis not present

## 2024-05-08 DIAGNOSIS — F4312 Post-traumatic stress disorder, chronic: Secondary | ICD-10-CM | POA: Diagnosis not present

## 2024-05-11 DIAGNOSIS — F900 Attention-deficit hyperactivity disorder, predominantly inattentive type: Secondary | ICD-10-CM | POA: Diagnosis not present

## 2024-05-11 DIAGNOSIS — F3174 Bipolar disorder, in full remission, most recent episode manic: Secondary | ICD-10-CM | POA: Diagnosis not present

## 2024-05-11 DIAGNOSIS — F3176 Bipolar disorder, in full remission, most recent episode depressed: Secondary | ICD-10-CM | POA: Diagnosis not present

## 2024-05-11 DIAGNOSIS — F4310 Post-traumatic stress disorder, unspecified: Secondary | ICD-10-CM | POA: Diagnosis not present

## 2024-05-22 DIAGNOSIS — F900 Attention-deficit hyperactivity disorder, predominantly inattentive type: Secondary | ICD-10-CM | POA: Diagnosis not present

## 2024-05-22 DIAGNOSIS — F3163 Bipolar disorder, current episode mixed, severe, without psychotic features: Secondary | ICD-10-CM | POA: Diagnosis not present

## 2024-05-22 DIAGNOSIS — F411 Generalized anxiety disorder: Secondary | ICD-10-CM | POA: Diagnosis not present

## 2024-05-22 DIAGNOSIS — F4312 Post-traumatic stress disorder, chronic: Secondary | ICD-10-CM | POA: Diagnosis not present

## 2024-05-29 DIAGNOSIS — F411 Generalized anxiety disorder: Secondary | ICD-10-CM | POA: Diagnosis not present

## 2024-05-29 DIAGNOSIS — F4312 Post-traumatic stress disorder, chronic: Secondary | ICD-10-CM | POA: Diagnosis not present

## 2024-05-29 DIAGNOSIS — F3163 Bipolar disorder, current episode mixed, severe, without psychotic features: Secondary | ICD-10-CM | POA: Diagnosis not present

## 2024-05-29 DIAGNOSIS — F900 Attention-deficit hyperactivity disorder, predominantly inattentive type: Secondary | ICD-10-CM | POA: Diagnosis not present

## 2024-06-04 DIAGNOSIS — F3163 Bipolar disorder, current episode mixed, severe, without psychotic features: Secondary | ICD-10-CM | POA: Diagnosis not present

## 2024-06-04 DIAGNOSIS — F411 Generalized anxiety disorder: Secondary | ICD-10-CM | POA: Diagnosis not present

## 2024-06-04 DIAGNOSIS — F900 Attention-deficit hyperactivity disorder, predominantly inattentive type: Secondary | ICD-10-CM | POA: Diagnosis not present

## 2024-06-04 DIAGNOSIS — F4312 Post-traumatic stress disorder, chronic: Secondary | ICD-10-CM | POA: Diagnosis not present

## 2024-06-12 DIAGNOSIS — F3163 Bipolar disorder, current episode mixed, severe, without psychotic features: Secondary | ICD-10-CM | POA: Diagnosis not present

## 2024-06-12 DIAGNOSIS — F900 Attention-deficit hyperactivity disorder, predominantly inattentive type: Secondary | ICD-10-CM | POA: Diagnosis not present

## 2024-06-12 DIAGNOSIS — F4312 Post-traumatic stress disorder, chronic: Secondary | ICD-10-CM | POA: Diagnosis not present

## 2024-06-12 DIAGNOSIS — F411 Generalized anxiety disorder: Secondary | ICD-10-CM | POA: Diagnosis not present

## 2024-06-19 DIAGNOSIS — F900 Attention-deficit hyperactivity disorder, predominantly inattentive type: Secondary | ICD-10-CM | POA: Diagnosis not present

## 2024-06-19 DIAGNOSIS — F411 Generalized anxiety disorder: Secondary | ICD-10-CM | POA: Diagnosis not present

## 2024-06-19 DIAGNOSIS — F3163 Bipolar disorder, current episode mixed, severe, without psychotic features: Secondary | ICD-10-CM | POA: Diagnosis not present

## 2024-06-19 DIAGNOSIS — F4312 Post-traumatic stress disorder, chronic: Secondary | ICD-10-CM | POA: Diagnosis not present

## 2024-06-26 DIAGNOSIS — F4312 Post-traumatic stress disorder, chronic: Secondary | ICD-10-CM | POA: Diagnosis not present

## 2024-06-26 DIAGNOSIS — F900 Attention-deficit hyperactivity disorder, predominantly inattentive type: Secondary | ICD-10-CM | POA: Diagnosis not present

## 2024-06-26 DIAGNOSIS — F411 Generalized anxiety disorder: Secondary | ICD-10-CM | POA: Diagnosis not present

## 2024-06-26 DIAGNOSIS — F3163 Bipolar disorder, current episode mixed, severe, without psychotic features: Secondary | ICD-10-CM | POA: Diagnosis not present

## 2024-07-03 DIAGNOSIS — F4312 Post-traumatic stress disorder, chronic: Secondary | ICD-10-CM | POA: Diagnosis not present

## 2024-07-03 DIAGNOSIS — F411 Generalized anxiety disorder: Secondary | ICD-10-CM | POA: Diagnosis not present

## 2024-07-03 DIAGNOSIS — F3163 Bipolar disorder, current episode mixed, severe, without psychotic features: Secondary | ICD-10-CM | POA: Diagnosis not present

## 2024-07-03 DIAGNOSIS — F900 Attention-deficit hyperactivity disorder, predominantly inattentive type: Secondary | ICD-10-CM | POA: Diagnosis not present

## 2024-07-10 DIAGNOSIS — F900 Attention-deficit hyperactivity disorder, predominantly inattentive type: Secondary | ICD-10-CM | POA: Diagnosis not present

## 2024-07-10 DIAGNOSIS — F411 Generalized anxiety disorder: Secondary | ICD-10-CM | POA: Diagnosis not present

## 2024-07-10 DIAGNOSIS — F4312 Post-traumatic stress disorder, chronic: Secondary | ICD-10-CM | POA: Diagnosis not present

## 2024-07-10 DIAGNOSIS — F3163 Bipolar disorder, current episode mixed, severe, without psychotic features: Secondary | ICD-10-CM | POA: Diagnosis not present

## 2024-07-17 DIAGNOSIS — F4312 Post-traumatic stress disorder, chronic: Secondary | ICD-10-CM | POA: Diagnosis not present

## 2024-07-17 DIAGNOSIS — F411 Generalized anxiety disorder: Secondary | ICD-10-CM | POA: Diagnosis not present

## 2024-07-17 DIAGNOSIS — F3163 Bipolar disorder, current episode mixed, severe, without psychotic features: Secondary | ICD-10-CM | POA: Diagnosis not present

## 2024-07-17 DIAGNOSIS — F900 Attention-deficit hyperactivity disorder, predominantly inattentive type: Secondary | ICD-10-CM | POA: Diagnosis not present

## 2024-07-24 DIAGNOSIS — F4312 Post-traumatic stress disorder, chronic: Secondary | ICD-10-CM | POA: Diagnosis not present

## 2024-07-24 DIAGNOSIS — F3163 Bipolar disorder, current episode mixed, severe, without psychotic features: Secondary | ICD-10-CM | POA: Diagnosis not present

## 2024-07-24 DIAGNOSIS — F411 Generalized anxiety disorder: Secondary | ICD-10-CM | POA: Diagnosis not present

## 2024-07-24 DIAGNOSIS — F900 Attention-deficit hyperactivity disorder, predominantly inattentive type: Secondary | ICD-10-CM | POA: Diagnosis not present

## 2024-07-31 DIAGNOSIS — F411 Generalized anxiety disorder: Secondary | ICD-10-CM | POA: Diagnosis not present

## 2024-07-31 DIAGNOSIS — F4312 Post-traumatic stress disorder, chronic: Secondary | ICD-10-CM | POA: Diagnosis not present

## 2024-07-31 DIAGNOSIS — F3163 Bipolar disorder, current episode mixed, severe, without psychotic features: Secondary | ICD-10-CM | POA: Diagnosis not present

## 2024-07-31 DIAGNOSIS — F900 Attention-deficit hyperactivity disorder, predominantly inattentive type: Secondary | ICD-10-CM | POA: Diagnosis not present

## 2024-08-07 DIAGNOSIS — F900 Attention-deficit hyperactivity disorder, predominantly inattentive type: Secondary | ICD-10-CM | POA: Diagnosis not present

## 2024-08-07 DIAGNOSIS — F411 Generalized anxiety disorder: Secondary | ICD-10-CM | POA: Diagnosis not present

## 2024-08-07 DIAGNOSIS — F3163 Bipolar disorder, current episode mixed, severe, without psychotic features: Secondary | ICD-10-CM | POA: Diagnosis not present

## 2024-08-07 DIAGNOSIS — F4312 Post-traumatic stress disorder, chronic: Secondary | ICD-10-CM | POA: Diagnosis not present

## 2024-08-13 DIAGNOSIS — F900 Attention-deficit hyperactivity disorder, predominantly inattentive type: Secondary | ICD-10-CM | POA: Diagnosis not present

## 2024-08-13 DIAGNOSIS — F3176 Bipolar disorder, in full remission, most recent episode depressed: Secondary | ICD-10-CM | POA: Diagnosis not present

## 2024-08-13 DIAGNOSIS — F4310 Post-traumatic stress disorder, unspecified: Secondary | ICD-10-CM | POA: Diagnosis not present

## 2024-08-13 DIAGNOSIS — F3174 Bipolar disorder, in full remission, most recent episode manic: Secondary | ICD-10-CM | POA: Diagnosis not present

## 2024-08-14 DIAGNOSIS — F3163 Bipolar disorder, current episode mixed, severe, without psychotic features: Secondary | ICD-10-CM | POA: Diagnosis not present

## 2024-08-14 DIAGNOSIS — F4312 Post-traumatic stress disorder, chronic: Secondary | ICD-10-CM | POA: Diagnosis not present

## 2024-08-14 DIAGNOSIS — F900 Attention-deficit hyperactivity disorder, predominantly inattentive type: Secondary | ICD-10-CM | POA: Diagnosis not present

## 2024-08-14 DIAGNOSIS — F411 Generalized anxiety disorder: Secondary | ICD-10-CM | POA: Diagnosis not present

## 2024-08-21 DIAGNOSIS — F3163 Bipolar disorder, current episode mixed, severe, without psychotic features: Secondary | ICD-10-CM | POA: Diagnosis not present

## 2024-08-21 DIAGNOSIS — F411 Generalized anxiety disorder: Secondary | ICD-10-CM | POA: Diagnosis not present

## 2024-08-21 DIAGNOSIS — F4312 Post-traumatic stress disorder, chronic: Secondary | ICD-10-CM | POA: Diagnosis not present

## 2024-08-21 DIAGNOSIS — F900 Attention-deficit hyperactivity disorder, predominantly inattentive type: Secondary | ICD-10-CM | POA: Diagnosis not present

## 2024-09-04 DIAGNOSIS — F900 Attention-deficit hyperactivity disorder, predominantly inattentive type: Secondary | ICD-10-CM | POA: Diagnosis not present

## 2024-09-04 DIAGNOSIS — F4312 Post-traumatic stress disorder, chronic: Secondary | ICD-10-CM | POA: Diagnosis not present

## 2024-09-04 DIAGNOSIS — F3163 Bipolar disorder, current episode mixed, severe, without psychotic features: Secondary | ICD-10-CM | POA: Diagnosis not present

## 2024-09-04 DIAGNOSIS — F411 Generalized anxiety disorder: Secondary | ICD-10-CM | POA: Diagnosis not present

## 2024-09-11 DIAGNOSIS — F411 Generalized anxiety disorder: Secondary | ICD-10-CM | POA: Diagnosis not present

## 2024-09-11 DIAGNOSIS — F3163 Bipolar disorder, current episode mixed, severe, without psychotic features: Secondary | ICD-10-CM | POA: Diagnosis not present

## 2024-09-11 DIAGNOSIS — F4312 Post-traumatic stress disorder, chronic: Secondary | ICD-10-CM | POA: Diagnosis not present

## 2024-09-11 DIAGNOSIS — F900 Attention-deficit hyperactivity disorder, predominantly inattentive type: Secondary | ICD-10-CM | POA: Diagnosis not present

## 2024-09-18 DIAGNOSIS — F411 Generalized anxiety disorder: Secondary | ICD-10-CM | POA: Diagnosis not present

## 2024-09-18 DIAGNOSIS — F3163 Bipolar disorder, current episode mixed, severe, without psychotic features: Secondary | ICD-10-CM | POA: Diagnosis not present

## 2024-09-18 DIAGNOSIS — F900 Attention-deficit hyperactivity disorder, predominantly inattentive type: Secondary | ICD-10-CM | POA: Diagnosis not present

## 2024-09-18 DIAGNOSIS — F4312 Post-traumatic stress disorder, chronic: Secondary | ICD-10-CM | POA: Diagnosis not present

## 2024-09-21 ENCOUNTER — Other Ambulatory Visit: Payer: Self-pay | Admitting: Neurology

## 2024-09-24 NOTE — Telephone Encounter (Signed)
 Might need an appointment with neurology and with pcp

## 2024-09-24 NOTE — Telephone Encounter (Signed)
 Called and spoke pt made aware regarding needing an appt. Pt verbalized understanding.

## 2024-09-25 DIAGNOSIS — F4312 Post-traumatic stress disorder, chronic: Secondary | ICD-10-CM | POA: Diagnosis not present

## 2024-09-25 DIAGNOSIS — F3163 Bipolar disorder, current episode mixed, severe, without psychotic features: Secondary | ICD-10-CM | POA: Diagnosis not present

## 2024-09-25 DIAGNOSIS — F900 Attention-deficit hyperactivity disorder, predominantly inattentive type: Secondary | ICD-10-CM | POA: Diagnosis not present

## 2024-09-25 DIAGNOSIS — F411 Generalized anxiety disorder: Secondary | ICD-10-CM | POA: Diagnosis not present

## 2024-10-02 DIAGNOSIS — F4312 Post-traumatic stress disorder, chronic: Secondary | ICD-10-CM | POA: Diagnosis not present

## 2024-10-02 DIAGNOSIS — F3163 Bipolar disorder, current episode mixed, severe, without psychotic features: Secondary | ICD-10-CM | POA: Diagnosis not present

## 2024-10-02 DIAGNOSIS — F411 Generalized anxiety disorder: Secondary | ICD-10-CM | POA: Diagnosis not present

## 2024-10-02 DIAGNOSIS — F900 Attention-deficit hyperactivity disorder, predominantly inattentive type: Secondary | ICD-10-CM | POA: Diagnosis not present

## 2024-10-16 DIAGNOSIS — F411 Generalized anxiety disorder: Secondary | ICD-10-CM | POA: Diagnosis not present

## 2024-10-16 DIAGNOSIS — F900 Attention-deficit hyperactivity disorder, predominantly inattentive type: Secondary | ICD-10-CM | POA: Diagnosis not present

## 2024-10-16 DIAGNOSIS — F3163 Bipolar disorder, current episode mixed, severe, without psychotic features: Secondary | ICD-10-CM | POA: Diagnosis not present

## 2024-10-16 DIAGNOSIS — F4312 Post-traumatic stress disorder, chronic: Secondary | ICD-10-CM | POA: Diagnosis not present

## 2024-10-30 DIAGNOSIS — F4312 Post-traumatic stress disorder, chronic: Secondary | ICD-10-CM | POA: Diagnosis not present

## 2024-10-30 DIAGNOSIS — F411 Generalized anxiety disorder: Secondary | ICD-10-CM | POA: Diagnosis not present

## 2024-10-30 DIAGNOSIS — F3163 Bipolar disorder, current episode mixed, severe, without psychotic features: Secondary | ICD-10-CM | POA: Diagnosis not present

## 2024-10-30 DIAGNOSIS — F900 Attention-deficit hyperactivity disorder, predominantly inattentive type: Secondary | ICD-10-CM | POA: Diagnosis not present

## 2024-11-06 DIAGNOSIS — F411 Generalized anxiety disorder: Secondary | ICD-10-CM | POA: Diagnosis not present

## 2024-11-06 DIAGNOSIS — F4312 Post-traumatic stress disorder, chronic: Secondary | ICD-10-CM | POA: Diagnosis not present

## 2024-11-06 DIAGNOSIS — F900 Attention-deficit hyperactivity disorder, predominantly inattentive type: Secondary | ICD-10-CM | POA: Diagnosis not present

## 2024-11-06 DIAGNOSIS — F3163 Bipolar disorder, current episode mixed, severe, without psychotic features: Secondary | ICD-10-CM | POA: Diagnosis not present

## 2024-11-30 ENCOUNTER — Ambulatory Visit (INDEPENDENT_AMBULATORY_CARE_PROVIDER_SITE_OTHER)

## 2024-11-30 VITALS — BP 136/86 | HR 102 | Temp 98.2°F | Wt 277.0 lb

## 2024-11-30 DIAGNOSIS — F17200 Nicotine dependence, unspecified, uncomplicated: Secondary | ICD-10-CM | POA: Diagnosis not present

## 2024-11-30 DIAGNOSIS — U071 COVID-19: Secondary | ICD-10-CM | POA: Diagnosis not present

## 2024-11-30 LAB — POC COVID19 BINAXNOW: SARS Coronavirus 2 Ag: POSITIVE — AB

## 2024-11-30 LAB — POCT RAPID STREP A (OFFICE): Rapid Strep A Screen: NEGATIVE

## 2024-11-30 MED ORDER — NICOTINE POLACRILEX 2 MG MT GUM: 2.0000 mg | CHEWING_GUM | OROMUCOSAL | 0 refills | Status: AC | PRN

## 2024-11-30 MED ORDER — NICOTINE 14 MG/24HR TD PT24: 14.0000 mg | MEDICATED_PATCH | Freq: Every day | TRANSDERMAL | 3 refills | Status: AC

## 2024-11-30 MED ORDER — NIRMATRELVIR/RITONAVIR (PAXLOVID)TABLET: 3.0000 | ORAL_TABLET | Freq: Two times a day (BID) | ORAL | 0 refills | Status: AC

## 2024-11-30 MED ORDER — NICOTINE POLACRILEX 4 MG MT GUM: 4.0000 mg | CHEWING_GUM | OROMUCOSAL | 1 refills | Status: DC | PRN

## 2024-11-30 NOTE — Progress Notes (Signed)
 "     Acute visit   Patient: Shelby Gallegos   DOB: 12-Aug-1981   44 y.o. Female  MRN: [id number] PCP: Franchot Isaiah LABOR, MD   Chief Complaint  Patient presents with   Sore Throat    On Monday dec 29th With a cough Shortness of breath with a couching spell  No fever no body aches Has a headache   Subjective    Discussed the use of AI scribe software for clinical note transcription with the patient, who gave verbal consent to proceed.  History of Present Illness Shelby Gallegos is a 44 year old female who presents with COVID-19 symptoms.  Symptoms began on Monday with a sore throat and have persisted for about five days without significant improvement. Her voice is affected, and she experiences discomfort between her eyes and throat. Her usual headache has resolved. Shortness of breath occurs only during coughing spells, not during regular activities like walking. She has not been around anyone known to be sick, including during a recent family Christmas gathering.  She manages her symptoms with Mucinex, Sudafed, and cough drops, which help with congestion. No nausea or vomiting.  She vapes nicotine  products and is attempting to quit, having significantly reduced her usage recently. She is interested in using nicotine  patches or lozenges to aid in cessation.  She has not taken Ubrelvy  due to insurance issues and has only used samples in the past. She is currently out of Vraylar , which is prescribed by her psychiatrist.  Review of systems as noted in HPI.   Objective    BP 136/86   Pulse (!) 102   Temp 98.2 F (36.8 C) (Oral)   Wt 277 lb 0.1 oz (125.7 kg)   LMP 11/11/2024   SpO2 94%   BMI 43.39 kg/m  Physical Exam Constitutional:      Appearance: Normal appearance.  HENT:     Head: Normocephalic and atraumatic.     Mouth/Throat:     Mouth: Mucous membranes are moist.  Eyes:     Pupils: Pupils are equal, round, and reactive to light.  Cardiovascular:     Rate and Rhythm:  Regular rhythm. Tachycardia present.  Pulmonary:     Effort: Pulmonary effort is normal. No respiratory distress.  Skin:    General: Skin is warm.  Neurological:     General: No focal deficit present.     Mental Status: She is alert.      Results for orders placed or performed in visit on 11/30/24  POC COVID-19 BinaxNow  Result Value Ref Range   SARS Coronavirus 2 Ag Positive (A) Negative  POCT rapid strep A  Result Value Ref Range   Rapid Strep A Screen Negative Negative    Assessment & Plan     Problem List Items Addressed This Visit       Other   COVID-19 - Primary   Relevant Medications   nirmatrelvir/ritonavir (PAXLOVID) 20 x 150 MG & 10 x 100MG  TABS   Other Relevant Orders   POC COVID-19 BinaxNow (Completed)   POCT rapid strep A (Completed)   Other Visit Diagnoses       Nicotine  dependence, uncomplicated, unspecified nicotine  product type       Relevant Medications   nicotine  (NICODERM CQ  - DOSED IN MG/24 HOURS) 14 mg/24hr patch   nicotine  polacrilex (NICORETTE) 2 MG gum      Assessment & Plan COVID-19 infection Confirmed COVID-19 with persistent symptoms including cough, congestion, and mild dyspnea for five  days. Negative strep test.  - Prescribed Paxlovid for five days, three tablets in the morning and three at night. Discussed potential side effects and advised discontinuation if intolerable. - Reviewed medication list, no interactions identified - Advised mask-wearing when symptomatic and avoiding close contact. - Recommend drinking plenty of fluids - Provided jury duty exemption note. - Return precautions given  Nicotine  dependence Currently vaping, attempting to quit with reduced usage. - Prescribed nicotine  patches and lozenges for smoking cessation.   Meds ordered this encounter  Medications   nicotine  (NICODERM CQ  - DOSED IN MG/24 HOURS) 14 mg/24hr patch    Sig: Place 1 patch (14 mg total) onto the skin daily.    Dispense:  28 patch     Refill:  3   nirmatrelvir/ritonavir (PAXLOVID) 20 x 150 MG & 10 x 100MG  TABS    Sig: Take 3 tablets by mouth 2 (two) times daily for 5 days. (Take nirmatrelvir 150 mg two tablets twice daily for 5 days and ritonavir 100 mg one tablet twice daily for 5 days) Patient GFR is >60    Dispense:  30 tablet    Refill:  0   DISCONTD: nicotine  polacrilex (NICORETTE) 4 MG gum    Sig: Take 1 each (4 mg total) by mouth as needed for smoking cessation.    Dispense:  100 tablet    Refill:  1   nicotine  polacrilex (NICORETTE) 2 MG gum    Sig: Take 1 each (2 mg total) by mouth as needed for smoking cessation.    Dispense:  100 tablet    Refill:  0     Return in about 4 weeks (around 12/28/2024) for Transfer of care visit.      Isaiah DELENA Pepper, MD  [facility] [phone] (phone) [phone] (fax) "

## 2024-12-28 ENCOUNTER — Ambulatory Visit

## 2025-01-02 ENCOUNTER — Ambulatory Visit

## 2025-01-02 VITALS — BP 124/85 | HR 110 | Temp 98.4°F | Wt 271.7 lb

## 2025-01-02 DIAGNOSIS — E039 Hypothyroidism, unspecified: Secondary | ICD-10-CM | POA: Diagnosis not present

## 2025-01-02 DIAGNOSIS — G43E09 Chronic migraine with aura, not intractable, without status migrainosus: Secondary | ICD-10-CM

## 2025-01-02 DIAGNOSIS — Z1231 Encounter for screening mammogram for malignant neoplasm of breast: Secondary | ICD-10-CM

## 2025-01-02 MED ORDER — KETOROLAC TROMETHAMINE 60 MG/2ML IM SOLN
60.0000 mg | Freq: Once | INTRAMUSCULAR | Status: AC
  Administered 2025-01-02: 30 mg via INTRAMUSCULAR

## 2025-01-02 MED ORDER — ONDANSETRON 4 MG PO TBDP: 4.0000 mg | ORAL_TABLET | Freq: Three times a day (TID) | ORAL | 0 refills | Status: AC | PRN

## 2025-01-02 MED ORDER — KETOROLAC TROMETHAMINE 30 MG/ML IJ SOLN: 30.0000 mg | Freq: Once | INTRAMUSCULAR | Status: DC

## 2025-01-02 MED ORDER — LEVOTHYROXINE SODIUM 75 MCG PO TABS: 75.0000 ug | ORAL_TABLET | Freq: Every day | ORAL | 3 refills | Status: AC

## 2025-01-02 MED ORDER — SUMATRIPTAN SUCCINATE 50 MG PO TABS: 50.0000 mg | ORAL_TABLET | ORAL | 3 refills | Status: AC | PRN

## 2025-01-02 NOTE — Progress Notes (Signed)
 "   New patient visit   Patient: Shelby Gallegos   DOB: 12-09-1980   44 y.o. Female  MRN: [id number] Visit Date: 01/02/2025  Today's healthcare provider: Isaiah DELENA Pepper, MD   Chief Complaint  Patient presents with   Migraine    Would like something to help with it Migraines are keeping her up unable to sleep Shakey and fatigue and weak today Sometimes there is nausea   Subjective    Shelby Gallegos is a 44 y.o. female who presents today as a new patient to establish care.   Discussed the use of AI scribe software for clinical note transcription with the patient, who gave verbal consent to proceed.  History of Present Illness Shelby Gallegos is a 44 year old female who presents with increased frequency of migraine attacks.  She has experienced migraines since middle school, with a recent increase in frequency, having had one last night and two nights prior. Her migraines are associated with auras, tingling in her face and arm, and persistent headache pain. She has been out of her Imitrex  injections and has been using Excedrin to manage symptoms.  She has a history of using sumatriptan  injections and needleless injections but has not tried oral Imitrex . She mentions difficulty obtaining Nurtec due to insurance issues and has not had it for a long time. She has been out of sumatriptan  injections for a while, using her last one as an emergency dose.  Her current headache is persistent, and she has taken Excedrin today. No current nausea, but she has experienced it with headaches in the past, finding Zofran  effective for relief.  She has been out of her thyroid  medication, Synthroid , for several months and has not taken it during this period.    Past Medical History:  Diagnosis Date   ADHD (attention deficit hyperactivity disorder)    Bipolar 1 disorder (HCC)    Migraine    Suicide attempt by drug overdose (HCC) 02/15/2019   Past Surgical History:  Procedure Laterality Date   NO PAST  SURGERIES     TENDON REPAIR Bilateral 02/20/2019   Procedure: TENDON REPAIR BILATERAL FOREARMS;  Surgeon: Murrell Drivers, MD;  Location: [facility];  Service: Orthopedics;  Laterality: Bilateral;   WOUND EXPLORATION Bilateral 02/20/2019   Procedure: WOUND EXPLORATION;  Surgeon: Murrell Drivers, MD;  Location:  SURGERY CENTER;  Service: Orthopedics;  Laterality: Bilateral;   Family Status  Relation Name Status   Mother  Alive   Father  Alive   Sister  Alive   Bruna Brigham  Alive  No partnership data on file   Family History  Problem Relation Age of Onset   Atrial fibrillation Mother    Bipolar disorder Paternal Uncle    Alcohol abuse Paternal Uncle    Depression Paternal Uncle    OCD Paternal Uncle    Social History   Socioeconomic History   Marital status: Single    Spouse name: Not on file   Number of children: 0   Years of education: Not on file   Highest education level: Bachelor's degree (e.g., BA, AB, BS)  Occupational History   Not on file  Tobacco Use   Smoking status: Every Day    Types: Cigarettes, E-cigarettes   Smokeless tobacco: Never   Tobacco comments:    Reports she vaps for past year.   Vaping Use   Vaping status: Every Day   Start date: 11/29/2021   Substances: Nicotine , Flavoring  Substance and Sexual Activity  Alcohol use: Not Currently    Comment: Reported last use on 04/14/23 when she stated trying to overdose, none since.   Drug use: No   Sexual activity: Not Currently    Birth control/protection: None  Other Topics Concern   Not on file  Social History Narrative   Not on file   Social Drivers of Health   Tobacco Use: High Risk (01/02/2025)   Patient History    Smoking Tobacco Use: Every Day    Smokeless Tobacco Use: Never    Passive Exposure: Not on file  Financial Resource Strain: Low Risk (01/02/2025)   Overall Financial Resource Strain (CARDIA)    Difficulty of Paying Living Expenses: Not very hard  Food Insecurity: No  Food Insecurity (01/02/2025)   Epic    Worried About Radiation Protection Practitioner of Food in the Last Year: Never true    Ran Out of Food in the Last Year: Never true  Transportation Needs: No Transportation Needs (01/02/2025)   Epic    Lack of Transportation (Medical): No    Lack of Transportation (Non-Medical): No  Physical Activity: Insufficiently Active (01/02/2025)   Exercise Vital Sign    Days of Exercise per Week: 2 days    Minutes of Exercise per Session: 20 min  Stress: Stress Concern Present (01/02/2025)   [employer] of Occupational Health - Occupational Stress Questionnaire    Feeling of Stress: Very much  Social Connections: Moderately Integrated (01/02/2025)   Social Connection and Isolation Panel    Frequency of Communication with Friends and Family: More than three times a week    Frequency of Social Gatherings with Friends and Family: Once a week    Attends Religious Services: More than 4 times per year    Active Member of Clubs or Organizations: Yes    Attends Banker Meetings: More than 4 times per year    Marital Status: Never married  Depression (PHQ2-9): High Risk (01/02/2025)   Depression (PHQ2-9)    PHQ-2 Score: 13  Alcohol Screen: Low Risk (04/15/2023)   Alcohol Screen    Last Alcohol Screening Score (AUDIT): 1  Housing: Low Risk (01/02/2025)   Epic    Unable to Pay for Housing in the Last Year: No    Number of Times Moved in the Last Year: 0    Homeless in the Last Year: No  Utilities: Not At Risk (04/15/2023)   [facility] Utilities    Threatened with loss of utilities: No  Health Literacy: Not on file   Show/hide medication list[1] Allergies[2]  Reviews of Systems as noted in HPI.      Objective    BP 124/85   Pulse (!) 110   Temp 98.4 F (36.9 C) (Oral)   Wt 271 lb 11.2 oz (123.2 kg)   SpO2 93%   BMI 42.55 kg/m     Physical Exam Constitutional:      Appearance: Normal appearance.  HENT:     Head: Normocephalic and atraumatic.     Mouth/Throat:      Mouth: Mucous membranes are moist.  Eyes:     Pupils: Pupils are equal, round, and reactive to light.  Pulmonary:     Effort: Pulmonary effort is normal.  Skin:    General: Skin is warm.  Neurological:     General: No focal deficit present.     Mental Status: She is alert.     Depression Screen    01/02/2025    1:28 PM 11/30/2024  1:29 PM 05/25/2023    9:52 AM 04/29/2023   11:19 AM  PHQ 2/9 Scores  PHQ - 2 Score 3 3 4 3   PHQ- 9 Score 13 14 18  16       Data saved with a previous flowsheet row definition   No results found for any visits on 01/02/25.  Assessment & Plan      Problem List Items Addressed This Visit       Cardiovascular and Mediastinum   Headache, migraine - Primary   Relevant Medications   sertraline  (ZOLOFT ) 50 MG tablet   SUMAtriptan  (IMITREX ) 50 MG tablet   ondansetron  (ZOFRAN -ODT) 4 MG disintegrating tablet   Other Relevant Orders   Ambulatory referral to Neurology     Endocrine   Hypothyroidism   Relevant Medications   levothyroxine  (SYNTHROID ) 75 MCG tablet   Other Visit Diagnoses       Screening mammogram for breast cancer       Relevant Orders   MM 3D SCREENING MAMMOGRAM BILATERAL BREAST      Assessment & Plan Chronic migraine with aura Chronic migraines with aura, characterized by facial and arm tingling. Recent exacerbation with increased frequency. Previous treatment with Imitrex  injections and Nurtec, but currently out of medications. Insurance issues with obtaining medications. No current nausea, but history of nausea with migraines. - Prescribed Imitrex  tablets for acute migraine management, to be taken at onset and again in two hours if needed. - Referred to neurologist for further management - Administered Toradol  injection for current headache. - Prescribed Zofran  for nausea as needed.  Acquired hypothyroidism Long-standing acquired hypothyroidism, currently not on Synthroid  for several months. Thyroid  function likely  abnormal due to lack of medication. Plan to restart Synthroid  and reassess thyroid  function after six weeks of consistent medication use. - Prescribed Synthroid  and instructed to restart medication. - Will recheck thyroid  function in six weeks after consistent medication use.  General health maintenance Due for routine health maintenance screenings, including mammogram and blood work. No prior mammogram history. Discussed the importance of annual mammograms starting at age 32. - Referred to breast center for mammogram. - Scheduled follow-up appointment in six weeks for blood work including thyroid  function, blood counts, electrolytes, kidney function, cholesterol, and blood sugar.    Return in about 6 weeks (around 02/13/2025) for Follow Up.      Isaiah DELENA Pepper, MD  [facility] [phone] (phone) [phone] (fax)     [1]  Outpatient Medications Prior to Visit  Medication Sig   amphetamine -dextroamphetamine  (ADDERALL) 20 MG tablet Take 20 mg by mouth 3 (three) times daily.   hydrOXYzine  (ATARAX ) 10 MG tablet Take 1 tablet (10 mg total) by mouth daily.   lamoTRIgine  (LAMICTAL ) 200 MG tablet Take 400 mg by mouth at bedtime.   nicotine  (NICODERM CQ  - DOSED IN MG/24 HOURS) 14 mg/24hr patch Place 1 patch (14 mg total) onto the skin daily.   nicotine  polacrilex (NICORETTE ) 2 MG gum Take 1 each (2 mg total) by mouth as needed for smoking cessation.   sertraline  (ZOLOFT ) 50 MG tablet Take 50 mg by mouth daily.   VRAYLAR  6 MG CAPS Take 1 capsule by mouth at bedtime.   [DISCONTINUED] cariprazine  (VRAYLAR ) 3 MG capsule Take 1 capsule (3 mg total) by mouth every evening.   [DISCONTINUED] cyanocobalamin  (VITAMIN B12) 1000 MCG/ML injection Inject 1 mL (1,000 mcg total) into the muscle once for 1 dose. Inject 1 mL into the muscle once a week for 3 weeks  and then once monthly   [DISCONTINUED] gabapentin (NEURONTIN) 400 MG capsule Take 400 mg by mouth in the morning,  at noon, in the evening, and at bedtime.   [DISCONTINUED] levothyroxine  (SYNTHROID ) 75 MCG tablet Take 75 mcg by mouth daily before breakfast.   [DISCONTINUED] prazosin  (MINIPRESS ) 1 MG capsule Take 1 capsule (1 mg total) by mouth at bedtime.   [DISCONTINUED] Rimegepant Sulfate (NURTEC) 75 MG TBDP Take 75 mg by mouth as needed (For migraines).   [DISCONTINUED] sertraline  (ZOLOFT ) 25 MG tablet Take 1 tablet (25 mg total) by mouth at bedtime.   [DISCONTINUED] ALPRAZolam  (XANAX ) 0.5 MG tablet Take 0.5 mg by mouth daily as needed for anxiety. (Patient not taking: Reported on 01/02/2025)   [DISCONTINUED] SUMAtriptan  6 MG/0.5ML SOAJ Inject 6 mg into the skin as needed. (Patient taking differently: Inject 6 mg into the skin every 2 (two) hours as needed (For migraines or headaches).)   No facility-administered medications prior to visit.  [2]  Allergies Allergen Reactions   Onion     Triggers migraines   "

## 2025-02-13 ENCOUNTER — Ambulatory Visit
